# Patient Record
Sex: Female | Born: 1955 | Race: White | Hispanic: No | Marital: Single | Smoking: Never smoker
Health system: Southern US, Community
[De-identification: ages and names within clinical notes are randomized; demographics above are authoritative.]

## PROBLEM LIST (undated history)

## (undated) DIAGNOSIS — M75102 Unspecified rotator cuff tear or rupture of left shoulder, not specified as traumatic: Secondary | ICD-10-CM

## (undated) DIAGNOSIS — E118 Type 2 diabetes mellitus with unspecified complications: Secondary | ICD-10-CM

## (undated) DIAGNOSIS — H269 Unspecified cataract: Secondary | ICD-10-CM

## (undated) DIAGNOSIS — I10 Essential (primary) hypertension: Secondary | ICD-10-CM

## (undated) DIAGNOSIS — K219 Gastro-esophageal reflux disease without esophagitis: Secondary | ICD-10-CM

## (undated) DIAGNOSIS — N329 Bladder disorder, unspecified: Secondary | ICD-10-CM

## (undated) DIAGNOSIS — Z960 Presence of urogenital implants: Secondary | ICD-10-CM

## (undated) DIAGNOSIS — F32A Depression, unspecified: Secondary | ICD-10-CM

## (undated) DIAGNOSIS — D649 Anemia, unspecified: Secondary | ICD-10-CM

## (undated) DIAGNOSIS — M12812 Other specific arthropathies, not elsewhere classified, left shoulder: Secondary | ICD-10-CM

## (undated) DIAGNOSIS — M25552 Pain in left hip: Secondary | ICD-10-CM

## (undated) DIAGNOSIS — R112 Nausea with vomiting, unspecified: Secondary | ICD-10-CM

## (undated) DIAGNOSIS — Z978 Presence of other specified devices: Secondary | ICD-10-CM

## (undated) DIAGNOSIS — A4902 Methicillin resistant Staphylococcus aureus infection, unspecified site: Secondary | ICD-10-CM

## (undated) DIAGNOSIS — Z9641 Presence of insulin pump (external) (internal): Secondary | ICD-10-CM

## (undated) DIAGNOSIS — M25551 Pain in right hip: Secondary | ICD-10-CM

## (undated) DIAGNOSIS — S42352A Displaced comminuted fracture of shaft of humerus, left arm, initial encounter for closed fracture: Secondary | ICD-10-CM

## (undated) DIAGNOSIS — R296 Repeated falls: Secondary | ICD-10-CM

## (undated) DIAGNOSIS — F329 Major depressive disorder, single episode, unspecified: Secondary | ICD-10-CM

## (undated) HISTORY — DX: Essential (primary) hypertension: I10

## (undated) HISTORY — DX: Pain in right hip: M25.551

## (undated) HISTORY — DX: Major depressive disorder, single episode, unspecified: F32.9

## (undated) HISTORY — DX: Unspecified rotator cuff tear or rupture of left shoulder, not specified as traumatic: M75.102

## (undated) HISTORY — DX: Nausea with vomiting, unspecified: R11.2

## (undated) HISTORY — DX: Other specific arthropathies, not elsewhere classified, left shoulder: M12.812

## (undated) HISTORY — DX: Depression, unspecified: F32.A

## (undated) HISTORY — PX: EYE SURGERY: SHX253

## (undated) HISTORY — DX: Type 2 diabetes mellitus with unspecified complications: E11.8

## (undated) HISTORY — DX: Presence of insulin pump (external) (internal): Z96.41

## (undated) HISTORY — DX: Pain in left hip: M25.552

## (undated) HISTORY — DX: Displaced comminuted fracture of shaft of humerus, left arm, initial encounter for closed fracture: S42.352A

## (undated) HISTORY — DX: Repeated falls: R29.6

## (undated) HISTORY — PX: LAPAROSCOPIC OOPHORECTOMY: SUR783

## (undated) HISTORY — DX: Methicillin resistant Staphylococcus aureus infection, unspecified site: A49.02

## (undated) HISTORY — DX: Unspecified cataract: H26.9

---

## 2009-04-26 ENCOUNTER — Emergency Department: Payer: Self-pay | Admitting: Emergency Medicine

## 2009-04-28 ENCOUNTER — Emergency Department: Payer: Self-pay | Admitting: Emergency Medicine

## 2009-08-03 ENCOUNTER — Ambulatory Visit: Payer: Self-pay | Admitting: Internal Medicine

## 2009-08-24 ENCOUNTER — Ambulatory Visit: Payer: Self-pay | Admitting: Internal Medicine

## 2011-10-25 ENCOUNTER — Encounter: Payer: Self-pay | Admitting: Internal Medicine

## 2011-10-25 ENCOUNTER — Ambulatory Visit (INDEPENDENT_AMBULATORY_CARE_PROVIDER_SITE_OTHER): Payer: BC Managed Care – PPO | Admitting: Internal Medicine

## 2011-10-25 VITALS — BP 150/90 | HR 102 | Temp 98.6°F | Wt 163.5 lb

## 2011-10-25 DIAGNOSIS — F3289 Other specified depressive episodes: Secondary | ICD-10-CM

## 2011-10-25 DIAGNOSIS — E119 Type 2 diabetes mellitus without complications: Secondary | ICD-10-CM

## 2011-10-25 DIAGNOSIS — I10 Essential (primary) hypertension: Secondary | ICD-10-CM

## 2011-10-25 DIAGNOSIS — F329 Major depressive disorder, single episode, unspecified: Secondary | ICD-10-CM

## 2011-10-25 DIAGNOSIS — Z0000 Encounter for general adult medical examination without abnormal findings: Secondary | ICD-10-CM

## 2011-10-25 MED ORDER — LISINOPRIL 10 MG PO TABS: 10.0000 mg | ORAL_TABLET | Freq: Every day | ORAL | Status: DC

## 2011-10-25 MED ORDER — METFORMIN HCL 500 MG PO TABS: 500.0000 mg | ORAL_TABLET | Freq: Two times a day (BID) | ORAL | Status: DC

## 2011-10-25 MED ORDER — CITALOPRAM HYDROBROMIDE 20 MG PO TABS: 20.0000 mg | ORAL_TABLET | Freq: Every day | ORAL | Status: DC

## 2011-10-26 ENCOUNTER — Encounter: Payer: Self-pay | Admitting: Internal Medicine

## 2011-10-26 DIAGNOSIS — F329 Major depressive disorder, single episode, unspecified: Secondary | ICD-10-CM | POA: Insufficient documentation

## 2011-10-26 DIAGNOSIS — I10 Essential (primary) hypertension: Secondary | ICD-10-CM | POA: Insufficient documentation

## 2011-10-26 DIAGNOSIS — E1165 Type 2 diabetes mellitus with hyperglycemia: Secondary | ICD-10-CM | POA: Insufficient documentation

## 2011-10-26 LAB — COMPREHENSIVE METABOLIC PANEL
ALT: 67 U/L — ABNORMAL HIGH (ref 0–35)
CO2: 23 mEq/L (ref 19–32)
Calcium: 9.5 mg/dL (ref 8.4–10.5)
Chloride: 98 mEq/L (ref 96–112)
Creatinine, Ser: 0.4 mg/dL (ref 0.4–1.2)
GFR: 156.56 mL/min (ref >60.00)
Glucose, Bld: 350 mg/dL — ABNORMAL HIGH (ref 70–99)

## 2011-10-26 LAB — CBC WITH DIFFERENTIAL/PLATELET
Eosinophils Relative: 2.2 % (ref 0.0–5.0)
HCT: 44.1 % (ref 36.0–46.0)
Hemoglobin: 15.3 g/dL — ABNORMAL HIGH (ref 12.0–15.0)
Lymphs Abs: 2.7 10*3/uL (ref 0.7–4.0)
Monocytes Relative: 4.5 % (ref 3.0–12.0)
Neutro Abs: 5.8 10*3/uL (ref 1.4–7.7)
RDW: 13.2 % (ref 11.5–14.6)
WBC: 9.1 10*3/uL (ref 4.5–10.5)

## 2011-10-26 LAB — TSH: TSH: 1.81 u[IU]/mL (ref 0.35–5.50)

## 2011-10-26 LAB — LIPID PANEL: Triglycerides: 2227 mg/dL — ABNORMAL HIGH (ref 0.0–149.0)

## 2011-10-26 NOTE — Assessment & Plan Note (Signed)
Patient has been lost to followup for over a year. She has not been checking her blood sugars. Will check hemoglobin A1c and renal function with labs today. We'll have her followup in one month. We'll plan to continue metformin.

## 2011-10-26 NOTE — Assessment & Plan Note (Signed)
Blood pressure is elevated today. However, patient is tearful in clinic. We'll plan to check renal function with labs. We'll plan to continue lisinopril. Patient will followup in one month.
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
## 2011-10-26 NOTE — Assessment & Plan Note (Signed)
Patient with ongoing depression which is recently exacerbated by death of her mother and interaction with her father. Offered support today. Will start Celexa. Discussed counseling, but she would like to hold off on this for now. We'll have her followup in one month.

## 2011-10-30 ENCOUNTER — Encounter: Payer: Self-pay | Admitting: Internal Medicine

## 2011-10-30 ENCOUNTER — Ambulatory Visit (INDEPENDENT_AMBULATORY_CARE_PROVIDER_SITE_OTHER): Payer: BC Managed Care – PPO | Admitting: Internal Medicine

## 2011-10-30 DIAGNOSIS — I10 Essential (primary) hypertension: Secondary | ICD-10-CM

## 2011-10-30 DIAGNOSIS — E781 Pure hyperglyceridemia: Secondary | ICD-10-CM

## 2011-10-30 DIAGNOSIS — F3289 Other specified depressive episodes: Secondary | ICD-10-CM

## 2011-10-30 DIAGNOSIS — F329 Major depressive disorder, single episode, unspecified: Secondary | ICD-10-CM

## 2011-10-30 DIAGNOSIS — E119 Type 2 diabetes mellitus without complications: Secondary | ICD-10-CM

## 2011-10-30 MED ORDER — METFORMIN HCL 500 MG PO TABS: 500.0000 mg | ORAL_TABLET | Freq: Two times a day (BID) | ORAL | Status: DC

## 2011-10-30 MED ORDER — LISINOPRIL 10 MG PO TABS: 10.0000 mg | ORAL_TABLET | Freq: Every day | ORAL | Status: DC

## 2011-10-30 MED ORDER — FENOFIBRATE 48 MG PO TABS: 48.0000 mg | ORAL_TABLET | Freq: Every day | ORAL | Status: DC

## 2011-10-30 MED ORDER — CITALOPRAM HYDROBROMIDE 20 MG PO TABS: 20.0000 mg | ORAL_TABLET | Freq: Every day | ORAL | Status: DC

## 2011-10-30 MED ORDER — INSULIN DETEMIR 100 UNIT/ML ~~LOC~~ SOLN: 10.0000 [IU] | Freq: Every day | SUBCUTANEOUS | Status: DC

## 2011-10-30 MED ORDER — INSULIN PEN NEEDLE 32G X 6 MM MISC: 1.0000 [IU] | Status: DC | PRN

## 2011-10-30 NOTE — Assessment & Plan Note (Signed)
Blood sugar very poorly controlled with hemoglobin A1c of 12.6%. Discussed use of insulin. Patient will start Levemir 10 units nightly. She will monitor blood sugar twice daily. She will call if any sugars less than 80 or greater than 250. We also discussed nutrition and importance of limiting carbohydrate intake, particularly processed carbohydrates. Encouraged her in her efforts at this. Also encouraged regular physical exercise. She is planning to start walking 15 minutes daily at work. We will plan to have her followup in 2 weeks.

## 2011-10-30 NOTE — Assessment & Plan Note (Signed)
Blood pressure is still slightly above goal. We'll plan to continue lisinopril for now. Encouraged her to increase her efforts at improvement glucose control and regular physical exercise including walking daily. She will followup in one month.

## 2011-10-30 NOTE — Patient Instructions (Addendum)
Inject 10units of Levemir at bedtime. Record blood sugar 1-2 times per day.  Goal fasting blood sugar 80-120.  Goal sugar 2 hours after meal less than 200. Any blood sugar less than 80, please call.  Follow up 2 weeks.

## 2011-10-30 NOTE — Assessment & Plan Note (Signed)
Patient with severe hypertriglyceridemia with triglycerides greater than 2000. We discussed the risk of pancreatitis. We discussed the importance of limiting intake of processed carbohydrates. We discussed the importance of maintaining control over her blood sugar. We'll plan to start fenofibrate. We'll plan to recheck lipids in 2-4 weeks. When lipids are below 1000, will plan to start statin medication. Patient will call if any questions or concerns. She will followup in one month.

## 2011-10-30 NOTE — Progress Notes (Signed)
  Subjective:    Patient ID: Julia Shea, female    DOB: June 18, 1956, 56 y.o.   MRN: [id number]  HPI 56 year old female with diabetes, hypertension, hyperlipidemia, and depression presents for followup. She was recently seen in clinic and had lab work performed which had several abnormalities. First, she was noted to have elevated triglycerides greater than 2000. We discussed the risk of pancreatitis with triglycerides this high. Her total cholesterol was also elevated above 300. We also discussed the risk of heart disease with elevated total cholesterol. We discussed the importance of improving diet and limiting intake of processed carbohydrates. We also discussed the importance of starting a medicine to lower her triglycerides and then medicine such as statin drug to help reduce her risk of heart attack. She has started to make some efforts to improve her diet by limiting intake of saturated fat and carbohydrates.  We also discussed her hemoglobin A1c which was markedly elevated at 12.6%. She has not been regularly checking her blood sugars. She is planning to get the help of her) and checking her blood sugars on a regular basis.  Outpatient Encounter Prescriptions as of 10/30/2011  Medication Sig Dispense Refill  . citalopram (CELEXA) 20 MG tablet Take 1 tablet (20 mg total) by mouth daily.  90 tablet  1  . lisinopril (PRINIVIL,ZESTRIL) 10 MG tablet Take 1 tablet (10 mg total) by mouth daily.  90 tablet  2  . metFORMIN (GLUCOPHAGE) 500 MG tablet Take 1 tablet (500 mg total) by mouth 2 (two) times daily with a meal.  180 tablet  1  . DISCONTD: citalopram (CELEXA) 20 MG tablet Take 1 tablet (20 mg total) by mouth daily.  30 tablet  3  . DISCONTD: lisinopril (PRINIVIL,ZESTRIL) 10 MG tablet Take 1 tablet (10 mg total) by mouth daily.  30 tablet  6  . DISCONTD: metFORMIN (GLUCOPHAGE) 500 MG tablet Take 1 tablet (500 mg total) by mouth 2 (two) times daily with a meal.  60 tablet  6  . fenofibrate  (TRICOR) 48 MG tablet Take 1 tablet (48 mg total) by mouth daily.  30 tablet  3  . insulin detemir (LEVEMIR FLEXPEN) 100 UNIT/ML injection Inject 10 Units into the skin at bedtime.  10 mL  12  . Insulin Pen Needle (NOVOFINE) 32G X 6 MM MISC 1 Units by Does not apply route as needed.  100 each  3    Review of Systems  Constitutional: Negative for fever and chills.  Respiratory: Negative for cough and shortness of breath.   Cardiovascular: Negative for chest pain and leg swelling.  Gastrointestinal: Negative for abdominal pain.  Musculoskeletal: Negative for myalgias.  Psychiatric/Behavioral: Positive for dysphoric mood.   BP 144/86  Pulse 100  Temp(Src) 98.3 F (36.8 C) (Oral)  Ht 5\' 2"  (1.575 m)  Wt 163 lb (73.936 kg)  BMI 29.81 kg/m2  SpO2 95%     Objective:   Physical Exam  Constitutional: She appears well-developed and well-nourished.  HENT:  Head: Normocephalic and atraumatic.  Eyes: Pupils are equal, round, and reactive to light.  Neck: Normal range of motion.  Pulmonary/Chest: Effort normal.  Skin: She is not diaphoretic.  Psychiatric: She has a normal mood and affect. Her behavior is normal. Judgment and thought content normal.          Assessment & Plan:  >50% of visit spent in counseling related to management of hypertriglyceridemia and diabetes

## 2011-11-05 ENCOUNTER — Encounter: Payer: Self-pay | Admitting: *Deleted

## 2011-11-14 ENCOUNTER — Telehealth: Payer: Self-pay | Admitting: *Deleted

## 2011-11-14 ENCOUNTER — Ambulatory Visit (INDEPENDENT_AMBULATORY_CARE_PROVIDER_SITE_OTHER): Payer: BC Managed Care – PPO | Admitting: Internal Medicine

## 2011-11-14 ENCOUNTER — Other Ambulatory Visit (HOSPITAL_COMMUNITY)
Admission: RE | Admit: 2011-11-14 | Discharge: 2011-11-14 | Disposition: A | Discharge status: Home / Self Care | Payer: BC Managed Care – PPO | Source: Ambulatory Visit | Attending: Internal Medicine | Admitting: Internal Medicine

## 2011-11-14 ENCOUNTER — Encounter: Payer: Self-pay | Admitting: Internal Medicine

## 2011-11-14 DIAGNOSIS — IMO0002 Reserved for concepts with insufficient information to code with codable children: Secondary | ICD-10-CM

## 2011-11-14 DIAGNOSIS — Z1239 Encounter for other screening for malignant neoplasm of breast: Secondary | ICD-10-CM

## 2011-11-14 DIAGNOSIS — Z01419 Encounter for gynecological examination (general) (routine) without abnormal findings: Secondary | ICD-10-CM | POA: Insufficient documentation

## 2011-11-14 DIAGNOSIS — E1165 Type 2 diabetes mellitus with hyperglycemia: Secondary | ICD-10-CM

## 2011-11-14 DIAGNOSIS — E119 Type 2 diabetes mellitus without complications: Secondary | ICD-10-CM

## 2011-11-14 DIAGNOSIS — Z0000 Encounter for general adult medical examination without abnormal findings: Secondary | ICD-10-CM | POA: Insufficient documentation

## 2011-11-14 DIAGNOSIS — Z1159 Encounter for screening for other viral diseases: Secondary | ICD-10-CM | POA: Insufficient documentation

## 2011-11-14 MED ORDER — INSULIN DETEMIR 100 UNIT/ML ~~LOC~~ SOLN: 20.0000 [IU] | Freq: Every day | SUBCUTANEOUS | Status: DC

## 2011-11-14 NOTE — Progress Notes (Signed)
Subjective:    Patient ID: Julia Shea, female    DOB: Jan 29, 1956, 56 y.o.   MRN: [id number]  HPI 56 year old female with history of diabetes, depression, hypertension presents for her annual exam. In regards to her diabetes, she notes that her blood sugars have been slightly improved. She brings records today showing blood sugars typically between 200 and 300. She has been taking Levemir 10 units daily. She denies any complications with this medication. She denies any low blood sugars less than 80. She has been limiting her intake of processed carbohydrates. She has also increased her exercise by walking.  In regards to general healthcare, she is due for mammogram which has been ordered. She reports that she is also due for Pap smear. She is up-to-date on vaccinations and lab work. She denies any new complaints today. She is in the process of making significant changes in an effort to improve her diet and exercise. She also notes that she is considering a change in her job in an effort to reduce stress.  Outpatient Encounter Prescriptions as of 11/14/2011  Medication Sig Dispense Refill  . citalopram (CELEXA) 20 MG tablet Take 1 tablet (20 mg total) by mouth daily.  90 tablet  1  . fenofibrate (TRICOR) 48 MG tablet Take 1 tablet (48 mg total) by mouth daily.  30 tablet  3  . insulin detemir (LEVEMIR FLEXPEN) 100 UNIT/ML injection Inject 20 Units into the skin at bedtime.  10 mL  12  . Insulin Pen Needle (NOVOFINE) 32G X 6 MM MISC 1 Units by Does not apply route as needed.  100 each  3  . lisinopril (PRINIVIL,ZESTRIL) 10 MG tablet Take 1 tablet (10 mg total) by mouth daily.  90 tablet  2  . metFORMIN (GLUCOPHAGE) 500 MG tablet Take 1 tablet (500 mg total) by mouth 2 (two) times daily with a meal.  180 tablet  1  . DISCONTD: insulin detemir (LEVEMIR FLEXPEN) 100 UNIT/ML injection Inject 10 Units into the skin at bedtime.  10 mL  12    Review of Systems  Constitutional: Negative for fever,  chills, appetite change, fatigue and unexpected weight change.  HENT: Negative for ear pain, congestion, sore throat, trouble swallowing, neck pain, voice change and sinus pressure.   Eyes: Negative for visual disturbance.  Respiratory: Negative for cough, shortness of breath, wheezing and stridor.   Cardiovascular: Negative for chest pain, palpitations and leg swelling.  Gastrointestinal: Negative for nausea, vomiting, abdominal pain, diarrhea, constipation, blood in stool, abdominal distention and anal bleeding.  Genitourinary: Negative for dysuria and flank pain.  Musculoskeletal: Negative for myalgias, arthralgias and gait problem.  Skin: Negative for color change and rash.  Neurological: Negative for dizziness and headaches.  Hematological: Negative for adenopathy. Does not bruise/bleed easily.  Psychiatric/Behavioral: Negative for suicidal ideas, sleep disturbance and dysphoric mood. The patient is not nervous/anxious.    BP 138/82  Pulse 88  Temp(Src) 98.1 F (36.7 C) (Oral)  Ht 5' 1.5" (1.562 m)  Wt 164 lb (74.39 kg)  BMI 30.49 kg/m2  SpO2 98%     Objective:   Physical Exam  Constitutional: She is oriented to person, place, and time. She appears well-developed and well-nourished. No distress.  HENT:  Head: Normocephalic and atraumatic.  Right Ear: External ear normal.  Left Ear: External ear normal.  Nose: Nose normal.  Mouth/Throat: Oropharynx is clear and moist. No oropharyngeal exudate.  Eyes: Conjunctivae are normal. Pupils are equal, round, and reactive to light. Right eye  exhibits no discharge. Left eye exhibits no discharge. No scleral icterus.  Neck: Normal range of motion. Neck supple. No tracheal deviation present. No thyromegaly present.  Cardiovascular: Normal rate, regular rhythm, normal heart sounds and intact distal pulses.  Exam reveals no gallop and no friction rub.   No murmur heard. Pulmonary/Chest: Effort normal and breath sounds normal. No respiratory  distress. She has no wheezes. She has no rales. She exhibits no tenderness.  Abdominal: Soft. Bowel sounds are normal. She exhibits no distension and no mass. There is no tenderness. There is no rebound and no guarding.  Genitourinary: Uterus normal. No breast swelling, tenderness, discharge or bleeding. Pelvic exam was performed with patient prone. There is no rash, tenderness or lesion on the right labia. There is no rash, tenderness or lesion on the left labia. Uterus is not enlarged and not tender. Cervix exhibits no motion tenderness, no discharge and no friability. Right adnexum displays no mass, no tenderness and no fullness. Left adnexum displays no mass, no tenderness and no fullness. There is erythema and tenderness (difficult to examine with even small speculum because of tenderness) around the vagina. No vaginal discharge found.  Musculoskeletal: Normal range of motion. She exhibits no edema and no tenderness.  Lymphadenopathy:    She has no cervical adenopathy.  Neurological: She is alert and oriented to person, place, and time. No cranial nerve deficit. She exhibits normal muscle tone. Coordination normal.  Skin: Skin is warm and dry. No rash noted. She is not diaphoretic. No erythema. No pallor.  Psychiatric: She has a normal mood and affect. Her behavior is normal. Judgment and thought content normal.          Assessment & Plan:

## 2011-11-14 NOTE — Progress Notes (Signed)
Addended by: Jobie Quaker on: 11/14/2011 02:28 PM   Modules accepted: Orders

## 2011-11-14 NOTE — Assessment & Plan Note (Signed)
Exam normal today including breast exam. Pap smear is pending. Will set up mammogram. Patient is up-to-date on lab work. She is also up-to-date on vaccinations.

## 2011-11-14 NOTE — Patient Instructions (Signed)
You are doing well.  Increase Levemir to 20units daily.  Email or call with blood sugar readings next week.  Follow up 1 month.

## 2011-11-14 NOTE — Telephone Encounter (Signed)
FML[employer] form found, left VM for pt that they were ready for pick up. Copy sent to be scanned

## 2011-11-14 NOTE — Assessment & Plan Note (Signed)
Blood sugars are gradually improving. However, still above goal. Will increase dose of Levemir to 20 units daily. Patient will continue to record blood sugars and will e-mail or call with blood sugar report in one week. Followup in one month.

## 2011-11-14 NOTE — Assessment & Plan Note (Signed)
Breast exam normal today. Will set up mammogram. 

## 2011-11-21 ENCOUNTER — Telehealth: Payer: Self-pay | Admitting: *Deleted

## 2011-11-21 NOTE — Telephone Encounter (Signed)
Message copied by Vernie Murders on Wed Nov 21, 2011  6:10 PM ------      Message from: Ronna Polio A      Created: Tue Nov 20, 2011  1:34 PM       It will not allow me to release PAP to Mychart

## 2011-12-07 ENCOUNTER — Encounter: Payer: Self-pay | Admitting: Internal Medicine

## 2011-12-12 ENCOUNTER — Ambulatory Visit: Payer: BC Managed Care – PPO | Admitting: Internal Medicine

## 2012-03-26 ENCOUNTER — Other Ambulatory Visit: Payer: Self-pay | Admitting: Internal Medicine

## 2012-03-27 NOTE — Telephone Encounter (Signed)
Pt needs to make follow up

## 2012-03-28 ENCOUNTER — Other Ambulatory Visit: Payer: Self-pay | Admitting: Internal Medicine

## 2012-07-06 ENCOUNTER — Other Ambulatory Visit: Payer: Self-pay | Admitting: Internal Medicine

## 2012-07-09 ENCOUNTER — Other Ambulatory Visit: Payer: Self-pay | Admitting: Internal Medicine

## 2012-07-10 ENCOUNTER — Other Ambulatory Visit: Payer: Self-pay | Admitting: *Deleted

## 2012-07-10 DIAGNOSIS — F329 Major depressive disorder, single episode, unspecified: Secondary | ICD-10-CM

## 2012-07-10 MED ORDER — CITALOPRAM HYDROBROMIDE 20 MG PO TABS: 20.0000 mg | ORAL_TABLET | Freq: Every day | ORAL | Status: DC

## 2012-10-19 ENCOUNTER — Other Ambulatory Visit: Payer: Self-pay | Admitting: Internal Medicine

## 2012-10-24 ENCOUNTER — Other Ambulatory Visit: Payer: Self-pay | Admitting: Internal Medicine

## 2012-10-24 MED ORDER — FENOFIBRATE 48 MG PO TABS: 48.0000 mg | ORAL_TABLET | Freq: Every day | ORAL | Status: DC

## 2012-10-24 MED ORDER — METFORMIN HCL 500 MG PO TABS: 500.0000 mg | ORAL_TABLET | Freq: Two times a day (BID) | ORAL | Status: DC

## 2012-10-24 NOTE — Telephone Encounter (Signed)
Left message on voicemail. Rx refills for one month supply only was sent to pharmacy. Pt needs to make an appointment.

## 2012-10-24 NOTE — Telephone Encounter (Signed)
Left message on voicemail that pt needs to be seen due to Hemoglobin A1c is out of control per Dr. Dan Humphreys. Only 30 day supply sent to pharmacy.

## 2012-10-24 NOTE — Telephone Encounter (Signed)
Pt needs to be seen. We can authorize 1 month refill on both Fenofibrate and Metformin, but will need to be seen after that.

## 2012-10-28 NOTE — Telephone Encounter (Signed)
Left message on mobile phone and work number to call back.

## 2012-10-30 NOTE — Telephone Encounter (Signed)
Left message that pt needs to call for an appt in order to receive refill

## 2012-11-13 LAB — HM PAP SMEAR: HM Pap smear: NEGATIVE

## 2012-11-15 ENCOUNTER — Other Ambulatory Visit: Payer: Self-pay

## 2013-02-18 ENCOUNTER — Telehealth: Payer: Self-pay | Admitting: *Deleted

## 2013-02-18 NOTE — Telephone Encounter (Signed)
Patient called, she woke up bleeding from the vaginal area. She has been in menopause for about 10 years now, no abdominal cramping at all. Noticed that it when she wiped and felt like it was flowing, she can fill up a pad. No other symptoms associated at all, no fever or nausea.

## 2013-02-18 NOTE — Telephone Encounter (Signed)
Left message to call back and schedule an appointment to be seen.

## 2013-02-18 NOTE — Telephone Encounter (Signed)
Needs to be seen

## 2013-04-28 ENCOUNTER — Telehealth: Payer: Self-pay | Admitting: Internal Medicine

## 2013-04-28 NOTE — Telephone Encounter (Signed)
Pt has not kept follow up in over 1 year. She has uncontrolled diabetes. Please send discharge letter.

## 2013-05-01 NOTE — Telephone Encounter (Signed)
Can you please call this pt and see if she is planning to keep follow up? If she wants to remain under our care, she will need to commit to appointments every 3 months to manage diabetes. If not we can forward her records to another provider.

## 2013-05-06 NOTE — Telephone Encounter (Signed)
Patient called back she made an acute visit for in the morning due to her thinking she has an infection.  I explained to her that being a diabetic she needed to be seen every 3 months.  She said that she didn't know about that she said that she has always came yearly.

## 2013-05-06 NOTE — Telephone Encounter (Signed)
Left msg asking patient to return my call so I can let her know what Dr Dan Humphreys has requested.

## 2013-05-07 ENCOUNTER — Encounter: Payer: Self-pay | Admitting: Internal Medicine

## 2013-05-07 ENCOUNTER — Ambulatory Visit (INDEPENDENT_AMBULATORY_CARE_PROVIDER_SITE_OTHER): Payer: BC Managed Care – PPO | Admitting: Internal Medicine

## 2013-05-07 ENCOUNTER — Telehealth: Payer: Self-pay | Admitting: *Deleted

## 2013-05-07 ENCOUNTER — Ambulatory Visit: Payer: Self-pay | Admitting: Internal Medicine

## 2013-05-07 VITALS — BP 160/90 | HR 82 | Temp 98.1°F | Wt 148.0 lb

## 2013-05-07 DIAGNOSIS — R1011 Right upper quadrant pain: Secondary | ICD-10-CM

## 2013-05-07 DIAGNOSIS — I10 Essential (primary) hypertension: Secondary | ICD-10-CM

## 2013-05-07 DIAGNOSIS — E781 Pure hyperglyceridemia: Secondary | ICD-10-CM

## 2013-05-07 DIAGNOSIS — F3289 Other specified depressive episodes: Secondary | ICD-10-CM

## 2013-05-07 DIAGNOSIS — IMO0001 Reserved for inherently not codable concepts without codable children: Secondary | ICD-10-CM

## 2013-05-07 DIAGNOSIS — E1165 Type 2 diabetes mellitus with hyperglycemia: Secondary | ICD-10-CM

## 2013-05-07 DIAGNOSIS — F329 Major depressive disorder, single episode, unspecified: Secondary | ICD-10-CM

## 2013-05-07 LAB — MICROALBUMIN / CREATININE URINE RATIO
Creatinine,U: 56 mg/dL
Microalb Creat Ratio: 0.2 mg/g (ref 0.0–30.0)
Microalb, Ur: 0.1 mg/dL (ref 0.0–1.9)

## 2013-05-07 LAB — POCT URINALYSIS DIPSTICK
Bilirubin, UA: NEGATIVE
Blood, UA: NEGATIVE
Glucose, UA: >=1000
Leukocytes, UA: NEGATIVE
Nitrite, UA: POSITIVE
Protein, UA: 30
Spec Grav, UA: 1.015
Urobilinogen, UA: 1
pH, UA: 5

## 2013-05-07 LAB — COMPREHENSIVE METABOLIC PANEL
AST: 21 U/L (ref 0–37)
Albumin: 3.9 g/dL (ref 3.5–5.2)
BUN: 12 mg/dL (ref 6–23)
Calcium: 9.4 mg/dL (ref 8.4–10.5)
Chloride: 99 mEq/L (ref 96–112)
Creatinine, Ser: 0.5 mg/dL (ref 0.4–1.2)
Glucose, Bld: 305 mg/dL — ABNORMAL HIGH (ref 70–99)
Potassium: 3.6 mEq/L (ref 3.5–5.1)

## 2013-05-07 LAB — CBC WITH DIFFERENTIAL/PLATELET
Basophils Absolute: 0 10*3/uL (ref 0.0–0.1)
Basophils Relative: 0.6 % (ref 0.0–3.0)
Eosinophils Absolute: 0.1 10*3/uL (ref 0.0–0.7)
Eosinophils Relative: 2.1 % (ref 0.0–5.0)
HCT: 41.6 % (ref 36.0–46.0)
Hemoglobin: 13.9 g/dL (ref 12.0–15.0)
Lymphocytes Relative: 35 % (ref 12.0–46.0)
Lymphs Abs: 2.3 10*3/uL (ref 0.7–4.0)
MCHC: 33.4 g/dL (ref 30.0–36.0)
MCV: 87.3 fl (ref 78.0–100.0)
Monocytes Absolute: 0.4 10*3/uL (ref 0.1–1.0)
Monocytes Relative: 5.7 % (ref 3.0–12.0)
Neutro Abs: 3.8 10*3/uL (ref 1.4–7.7)
Neutrophils Relative %: 56.6 % (ref 43.0–77.0)
Platelets: 301 10*3/uL (ref 150.0–400.0)
RBC: 4.76 Mil/uL (ref 3.87–5.11)
RDW: 13.5 % (ref 11.5–14.6)
WBC: 6.6 10*3/uL (ref 4.5–10.5)

## 2013-05-07 LAB — HM COLONOSCOPY

## 2013-05-07 LAB — HEMOGLOBIN A1C: Hgb A1c MFr Bld: 12.2 % — ABNORMAL HIGH (ref 4.6–6.5)

## 2013-05-07 LAB — LIPASE: Lipase: 16 U/L (ref 11.0–59.0)

## 2013-05-07 LAB — PROTIME-INR
INR: 1 ratio (ref 0.8–1.0)
Prothrombin Time: 10.6 s (ref 10.2–12.4)

## 2013-05-07 MED ORDER — FENOFIBRATE 48 MG PO TABS: 48.0000 mg | ORAL_TABLET | Freq: Every day | ORAL | Status: DC

## 2013-05-07 MED ORDER — METFORMIN HCL 500 MG PO TABS: 500.0000 mg | ORAL_TABLET | Freq: Two times a day (BID) | ORAL | Status: DC

## 2013-05-07 MED ORDER — CIPROFLOXACIN HCL 500 MG PO TABS: 500.0000 mg | ORAL_TABLET | Freq: Two times a day (BID) | ORAL | Status: DC

## 2013-05-07 MED ORDER — CITALOPRAM HYDROBROMIDE 40 MG PO TABS: 40.0000 mg | ORAL_TABLET | Freq: Every day | ORAL | Status: DC

## 2013-05-07 NOTE — Assessment & Plan Note (Signed)
Symptoms of depression have recently worsened. Will increase citalopram to 40 mg daily. Patient will call if symptoms are not improving. Followup in 4 weeks or sooner as needed.

## 2013-05-07 NOTE — Progress Notes (Signed)
Subjective:    Patient ID: Julia Shea, female    DOB: 12-09-1955, 57 y.o.   MRN: [id number]  HPI 57 year old female with history of diabetes, hypertension, noncompliance presents for acute visit complaining of approximately one-week history of severe right upper quadrant abdominal pain that radiates around her back. Pain is described as sharp and constant. It is improved with physical activity. It is made worse when lying flat. There is no associated nausea, vomiting, change in bowel habits. She has not had any fever or chills. She denies any urinary symptoms such as dysuria, change in urinary frequency or urinary urgency. She tried taking some leftover antibiotic she had at home with no improvement.  In regards to diabetes, she has been noncompliant with medical care. She is not consistently take her Levemir or metformin. She did not bring record of blood sugars today.  In regards to depression, she reports that symptoms have worsened with increased frequency of both depressed and anxious mood. She attributes this to stress at work. She would like to increase her dose of citalopram.  Outpatient Encounter Prescriptions as of 05/07/2013  Medication Sig Dispense Refill  . citalopram (CELEXA) 40 MG tablet Take 1 tablet (40 mg total) by mouth daily.  90 tablet  1  . fenofibrate (TRICOR) 48 MG tablet Take 1 tablet (48 mg total) by mouth daily.  30 tablet  0  . insulin detemir (LEVEMIR) 100 UNIT/ML injection Inject 10 Units into the skin at bedtime.      . Insulin Pen Needle (NOVOFINE) 32G X 6 MM MISC 1 Units by Does not apply route as needed.  100 each  3  . lisinopril (PRINIVIL,ZESTRIL) 10 MG tablet Take 1 tablet (10 mg total) by mouth daily.  90 tablet  2  . metFORMIN (GLUCOPHAGE) 500 MG tablet Take 1 tablet (500 mg total) by mouth 2 (two) times daily with a meal.  60 tablet  0   No facility-administered encounter medications on file as of 05/07/2013.   BP 160/90  Pulse 82  Temp(Src) 98.1 F  (36.7 C) (Oral)  Wt 148 lb (67.132 kg)  BMI 27.51 kg/m2  SpO2 95%  Review of Systems  Constitutional: Negative for fever, chills, appetite change, fatigue and unexpected weight change.  HENT: Negative for ear pain, congestion, sore throat, trouble swallowing, neck pain, voice change and sinus pressure.   Eyes: Negative for visual disturbance.  Respiratory: Negative for cough, shortness of breath, wheezing and stridor.   Cardiovascular: Negative for chest pain, palpitations and leg swelling.  Gastrointestinal: Positive for abdominal pain. Negative for nausea, vomiting, diarrhea, constipation, blood in stool, abdominal distention and anal bleeding.  Genitourinary: Negative for dysuria and flank pain.  Musculoskeletal: Negative for myalgias, arthralgias and gait problem.  Skin: Negative for color change and rash.  Neurological: Negative for dizziness and headaches.  Hematological: Negative for adenopathy. Does not bruise/bleed easily.  Psychiatric/Behavioral: Negative for suicidal ideas, sleep disturbance and dysphoric mood. The patient is not nervous/anxious.        Objective:   Physical Exam  Constitutional: She is oriented to person, place, and time. She appears well-developed and well-nourished. No distress.  HENT:  Head: Normocephalic and atraumatic.  Right Ear: External ear normal.  Left Ear: External ear normal.  Nose: Nose normal.  Mouth/Throat: Oropharynx is clear and moist. No oropharyngeal exudate.  Eyes: Conjunctivae are normal. Pupils are equal, round, and reactive to light. Right eye exhibits no discharge. Left eye exhibits no discharge. No scleral icterus.  Neck:  Normal range of motion. Neck supple. No tracheal deviation present. No thyromegaly present.  Cardiovascular: Normal rate, regular rhythm, normal heart sounds and intact distal pulses.  Exam reveals no gallop and no friction rub.   No murmur heard. Pulmonary/Chest: Effort normal and breath sounds normal. No  accessory muscle usage. Not tachypneic. No respiratory distress. She has no decreased breath sounds. She has no wheezes. She has no rhonchi. She has no rales. She exhibits no tenderness.  Abdominal: Soft. Normal appearance and bowel sounds are normal. She exhibits no distension. There is hepatomegaly. There is tenderness in the right upper quadrant. There is CVA tenderness (mild right sided).  Musculoskeletal: Normal range of motion. She exhibits no edema and no tenderness.  Lymphadenopathy:    She has no cervical adenopathy.  Neurological: She is alert and oriented to person, place, and time. No cranial nerve deficit. She exhibits normal muscle tone. Coordination normal.  Skin: Skin is warm and dry. No rash noted. She is not diaphoretic. No erythema. No pallor.  Psychiatric: She has a normal mood and affect. Her behavior is normal. Judgment and thought content normal.          Assessment & Plan:

## 2013-05-07 NOTE — Telephone Encounter (Signed)
OK. Please have her take the Cipro as directed, (see result note on urine) 500mg  bid x 7 days. Let's have her follow up early next week to recheck symptoms.

## 2013-05-07 NOTE — Assessment & Plan Note (Signed)
Right upper quadrant abdominal pain and tenderness on exam are most consistent with cholecystitis. Right upper quadrant ultrasound performed today showed normal gallbladder however there was diffuse nodularity noted within the liver. Will get CT of the abdomen and pelvis for further evaluation. Will check CMP, CBC, lipase with labs. Urinalysis was positive for nitrate. She may have underlying urinary tract infection which is contributing however does not have symptoms of this. Will send urine for culture and start empiric Cipro.

## 2013-05-07 NOTE — Telephone Encounter (Signed)
Ben from CT called, he stated Hepatic Steatosis, liver less than 16 cm long. No acute abnormalities noted.

## 2013-05-07 NOTE — Telephone Encounter (Signed)
Patient informed and verbally agreed. Rx sent to pharmacy on file.

## 2013-05-07 NOTE — Telephone Encounter (Signed)
We will need to get CT abdomen and pelvis with contrast for further evaluation.

## 2013-05-07 NOTE — Assessment & Plan Note (Signed)
Noncompliant with followup for diabetes. Unclear that she is taking Levemir her and metformin on a regular basis. Previous blood sugars have been markedly elevated. Will check A1c with labs today. Encouraged better compliance with medical care and followup.

## 2013-05-07 NOTE — Assessment & Plan Note (Signed)
BP Readings from Last 3 Encounters:  05/07/13 160/90  11/14/11 138/82  10/30/11 144/86  Blood pressure elevated today, likely exacerbated by abdominal pain. Will monitor BP and follow up with recheck in 4 weeks. Will check renal function with labs today. Consider increase dose of lisinopril if persistent elevation of BP >140/90.

## 2013-05-07 NOTE — Telephone Encounter (Signed)
[facility] called with report stating patient gallbladder was normal but the liver is dense and fatty, slightly enlarged.

## 2013-05-15 ENCOUNTER — Encounter: Payer: Self-pay | Admitting: Internal Medicine

## 2013-05-15 ENCOUNTER — Ambulatory Visit (INDEPENDENT_AMBULATORY_CARE_PROVIDER_SITE_OTHER): Payer: BC Managed Care – PPO | Admitting: Internal Medicine

## 2013-05-15 VITALS — BP 144/90 | HR 86 | Temp 98.1°F | Wt 146.0 lb

## 2013-05-15 DIAGNOSIS — E1165 Type 2 diabetes mellitus with hyperglycemia: Secondary | ICD-10-CM

## 2013-05-15 DIAGNOSIS — K760 Fatty (change of) liver, not elsewhere classified: Secondary | ICD-10-CM | POA: Insufficient documentation

## 2013-05-15 DIAGNOSIS — K7689 Other specified diseases of liver: Secondary | ICD-10-CM

## 2013-05-15 DIAGNOSIS — R1011 Right upper quadrant pain: Secondary | ICD-10-CM

## 2013-05-15 LAB — POCT URINALYSIS DIPSTICK
Blood, UA: NEGATIVE
Glucose, UA: >=1000
Nitrite, UA: POSITIVE
Urobilinogen, UA: 0.2

## 2013-05-15 MED ORDER — CYCLOBENZAPRINE HCL 5 MG PO TABS: 5.0000 mg | ORAL_TABLET | Freq: Three times a day (TID) | ORAL | Status: DC | PRN

## 2013-05-15 MED ORDER — INSULIN DETEMIR 100 UNIT/ML ~~LOC~~ SOLN: 30.0000 [IU] | Freq: Every day | SUBCUTANEOUS | Status: DC

## 2013-05-15 MED ORDER — PANTOPRAZOLE SODIUM 40 MG PO TBEC
40.0000 mg | DELAYED_RELEASE_TABLET | Freq: Every day | ORAL | Status: DC
Start: 2013-05-15 — End: 2018-02-06

## 2013-05-15 NOTE — Assessment & Plan Note (Signed)
Persistent symptoms of right upper quadrant abdominal pain. CT of the abdomen was normal except for hepatic steatosis. Given marked epigastric and right upper quadrant tenderness on exam, still question possible gastritis versus ulcer. Will start pantoprazole 40 mg daily. Will set up GI evaluation for possible endoscopy and for monitoring of hepatic steatosis. Alternative diagnosis would include nerve entrapment and/or muscular strain. Will try adding Flexeril to see if any improvement in symptoms. Followup 4 weeks or sooner as needed.

## 2013-05-15 NOTE — Assessment & Plan Note (Signed)
Reviewed recent A1c which was greater than 12%. Again strongly encouraged better compliance with insulin. Will increase Levemir to 30 units daily. Followup in 4 weeks.

## 2013-05-15 NOTE — Progress Notes (Signed)
Subjective:    Patient ID: Julia Shea, female    DOB: 1956/08/07, 57 y.o.   MRN: [id number]  HPI 57 year old female with history of diabetes, hypertension, hepatic steatosis presents for followup after recent episode of right upper quadrant abdominal pain. She was seen in clinic last week complaining of right upper quadrant abdominal pain. There was concern for cholecystitis. She was sent for CT of the abdomen which was normal except for steatohepatitis. She reports that right-sided abdominal pain has been persistent over the last week. It seems to be worsened with movement. It is improved somewhat with warm compresses or running hot water over the area. She questions whether she may have a pulled muscle. She notes that she has had right upper quadrant abdominal pain off-and-on for years. She denies any nausea or vomiting. She denies fever or chills. She denies any dysuria, hematuria, change in urinary frequency or urgency. She was noted at her last visit to have a urinary tract infection and was treated with Cipro. She completed a course of this medication.  Outpatient Encounter Prescriptions as of 05/15/2013  Medication Sig Dispense Refill  . citalopram (CELEXA) 40 MG tablet Take 1 tablet (40 mg total) by mouth daily.  90 tablet  1  . fenofibrate (TRICOR) 48 MG tablet Take 1 tablet (48 mg total) by mouth daily.  30 tablet  0  . insulin detemir (LEVEMIR) 100 UNIT/ML injection Inject 0.3 mL (30 Units total) into the skin at bedtime.  10 mL  6  . Insulin Pen Needle (NOVOFINE) 32G X 6 MM MISC 1 Units by Does not apply route as needed.  100 each  3  . lisinopril (PRINIVIL,ZESTRIL) 10 MG tablet Take 1 tablet (10 mg total) by mouth daily.  90 tablet  2  . metFORMIN (GLUCOPHAGE) 500 MG tablet Take 1 tablet (500 mg total) by mouth 2 (two) times daily with a meal.  60 tablet  0  . [DISCONTINUED] insulin detemir (LEVEMIR) 100 UNIT/ML injection Inject 10 Units into the skin at bedtime.      . cyclobenzaprine  (FLEXERIL) 5 MG tablet Take 1-2 tablets (5-10 mg total) by mouth 3 (three) times daily as needed for muscle spasms.  60 tablet  2  . pantoprazole (PROTONIX) 40 MG tablet Take 1 tablet (40 mg total) by mouth daily.  30 tablet  6  . [DISCONTINUED] ciprofloxacin (CIPRO) 500 MG tablet Take 1 tablet (500 mg total) by mouth 2 (two) times daily.  14 tablet  0   No facility-administered encounter medications on file as of 05/15/2013.   BP 144/90  Pulse 86  Temp(Src) 98.1 F (36.7 C) (Oral)  Wt 146 lb (66.225 kg)  BMI 27.14 kg/m2  SpO2 97%  Review of Systems  Constitutional: Negative for fever, chills, appetite change, fatigue and unexpected weight change.  HENT: Negative for ear pain, congestion, sore throat, trouble swallowing, neck pain, voice change and sinus pressure.   Eyes: Negative for visual disturbance.  Respiratory: Negative for cough, shortness of breath, wheezing and stridor.   Cardiovascular: Negative for chest pain, palpitations and leg swelling.  Gastrointestinal: Positive for abdominal pain. Negative for nausea, vomiting, diarrhea, constipation, blood in stool, abdominal distention and anal bleeding.  Genitourinary: Negative for dysuria and flank pain.  Musculoskeletal: Negative for myalgias, arthralgias and gait problem.  Skin: Negative for color change and rash.  Neurological: Negative for dizziness and headaches.  Hematological: Negative for adenopathy. Does not bruise/bleed easily.  Psychiatric/Behavioral: Negative for suicidal ideas, sleep disturbance and  dysphoric mood. The patient is not nervous/anxious.        Objective:   Physical Exam  Constitutional: She is oriented to person, place, and time. She appears well-developed and well-nourished. No distress.  HENT:  Head: Normocephalic and atraumatic.  Right Ear: External ear normal.  Left Ear: External ear normal.  Nose: Nose normal.  Mouth/Throat: Oropharynx is clear and moist. No oropharyngeal exudate.  Eyes:  Conjunctivae are normal. Pupils are equal, round, and reactive to light. Right eye exhibits no discharge. Left eye exhibits no discharge. No scleral icterus.  Neck: Normal range of motion. Neck supple. No tracheal deviation present. No thyromegaly present.  Cardiovascular: Normal rate, regular rhythm, normal heart sounds and intact distal pulses.  Exam reveals no gallop and no friction rub.   No murmur heard. Pulmonary/Chest: Effort normal and breath sounds normal. No accessory muscle usage. Not tachypneic. No respiratory distress. She has no decreased breath sounds. She has no wheezes. She has no rhonchi. She has no rales. She exhibits no tenderness.  Abdominal: Soft. Bowel sounds are normal. She exhibits no distension and no mass. There is tenderness (RUQ). There is no rebound and no guarding.  Musculoskeletal: Normal range of motion. She exhibits no edema and no tenderness.  Lymphadenopathy:    She has no cervical adenopathy.  Neurological: She is alert and oriented to person, place, and time. No cranial nerve deficit. She exhibits normal muscle tone. Coordination normal.  Skin: Skin is warm and dry. No rash noted. She is not diaphoretic. No erythema. No pallor.  Psychiatric: She has a normal mood and affect. Her behavior is normal. Judgment and thought content normal.          Assessment & Plan:

## 2013-05-15 NOTE — Assessment & Plan Note (Signed)
Discussed diagnosis of hepatic steatosis and importance of good blood sugar control. Will set up GI evaluation.

## 2013-05-18 ENCOUNTER — Telehealth: Payer: Self-pay | Admitting: *Deleted

## 2013-05-18 ENCOUNTER — Encounter: Payer: Self-pay | Admitting: Internal Medicine

## 2013-05-18 MED ORDER — CIPROFLOXACIN HCL 500 MG PO TABS: 500.0000 mg | ORAL_TABLET | Freq: Two times a day (BID) | ORAL | Status: DC

## 2013-05-18 NOTE — Telephone Encounter (Signed)
Message copied by Theola Sequin on Mon May 18, 2013  3:04 PM ------      Message from: Ronna Polio A      Created: Fri May 15, 2013 10:57 AM       Can you please send urine for culture? Can you ask pt to continue Cipro 500mg  po bid for another week (we will need to call in refill), until we get urine culture back. Urine is suggestive of persistent infection. ------

## 2013-05-18 NOTE — Telephone Encounter (Signed)
New Rx sent to [facility] pharmacy

## 2013-05-26 ENCOUNTER — Encounter: Payer: Self-pay | Admitting: Emergency Medicine

## 2013-06-08 ENCOUNTER — Encounter: Payer: BC Managed Care – PPO | Admitting: Internal Medicine

## 2013-06-14 ENCOUNTER — Other Ambulatory Visit: Payer: Self-pay | Admitting: Internal Medicine

## 2013-06-15 ENCOUNTER — Other Ambulatory Visit: Payer: Self-pay | Admitting: *Deleted

## 2013-06-15 DIAGNOSIS — I10 Essential (primary) hypertension: Secondary | ICD-10-CM

## 2013-06-15 MED ORDER — LISINOPRIL 10 MG PO TABS: 10.0000 mg | ORAL_TABLET | Freq: Every day | ORAL | Status: DC

## 2013-06-15 NOTE — Telephone Encounter (Signed)
Eprescribed.

## 2013-08-06 ENCOUNTER — Other Ambulatory Visit: Payer: Self-pay

## 2013-10-08 ENCOUNTER — Other Ambulatory Visit: Payer: Self-pay | Admitting: Internal Medicine

## 2013-10-31 ENCOUNTER — Other Ambulatory Visit: Payer: Self-pay | Admitting: Internal Medicine

## 2014-05-31 ENCOUNTER — Ambulatory Visit: Payer: Self-pay | Admitting: Internal Medicine

## 2014-06-11 ENCOUNTER — Ambulatory Visit: Payer: Self-pay | Admitting: Internal Medicine

## 2014-06-11 DIAGNOSIS — Z0289 Encounter for other administrative examinations: Secondary | ICD-10-CM

## 2014-06-15 ENCOUNTER — Encounter: Payer: Self-pay | Admitting: *Deleted

## 2014-07-22 ENCOUNTER — Telehealth: Payer: Self-pay | Admitting: Internal Medicine

## 2014-07-22 NOTE — Telephone Encounter (Signed)
Dismissal Letter sent by Certified Mail [phone]/[id number]  Received the Return Receipt showing someone picked up the Dismissal 07/26/2014

## 2016-02-25 ENCOUNTER — Emergency Department
Admission: EM | Admit: 2016-02-25 | Discharge: 2016-02-25 | Disposition: A | Discharge status: Home / Self Care | Payer: Worker's Compensation | Attending: Emergency Medicine | Admitting: Emergency Medicine

## 2016-02-25 ENCOUNTER — Emergency Department: Payer: Worker's Compensation

## 2016-02-25 ENCOUNTER — Encounter: Payer: Self-pay | Admitting: Emergency Medicine

## 2016-02-25 DIAGNOSIS — Y999 Unspecified external cause status: Secondary | ICD-10-CM | POA: Insufficient documentation

## 2016-02-25 DIAGNOSIS — E119 Type 2 diabetes mellitus without complications: Secondary | ICD-10-CM | POA: Insufficient documentation

## 2016-02-25 DIAGNOSIS — I10 Essential (primary) hypertension: Secondary | ICD-10-CM | POA: Diagnosis not present

## 2016-02-25 DIAGNOSIS — Z79899 Other long term (current) drug therapy: Secondary | ICD-10-CM | POA: Diagnosis not present

## 2016-02-25 DIAGNOSIS — S42212A Unspecified displaced fracture of surgical neck of left humerus, initial encounter for closed fracture: Secondary | ICD-10-CM | POA: Diagnosis not present

## 2016-02-25 DIAGNOSIS — Y929 Unspecified place or not applicable: Secondary | ICD-10-CM | POA: Diagnosis not present

## 2016-02-25 DIAGNOSIS — Z7984 Long term (current) use of oral hypoglycemic drugs: Secondary | ICD-10-CM | POA: Diagnosis not present

## 2016-02-25 DIAGNOSIS — J45909 Unspecified asthma, uncomplicated: Secondary | ICD-10-CM | POA: Insufficient documentation

## 2016-02-25 DIAGNOSIS — S42302A Unspecified fracture of shaft of humerus, left arm, initial encounter for closed fracture: Secondary | ICD-10-CM

## 2016-02-25 DIAGNOSIS — Y939 Activity, unspecified: Secondary | ICD-10-CM | POA: Diagnosis not present

## 2016-02-25 DIAGNOSIS — W010XXA Fall on same level from slipping, tripping and stumbling without subsequent striking against object, initial encounter: Secondary | ICD-10-CM | POA: Diagnosis not present

## 2016-02-25 DIAGNOSIS — S4992XA Unspecified injury of left shoulder and upper arm, initial encounter: Secondary | ICD-10-CM | POA: Diagnosis present

## 2016-02-25 DIAGNOSIS — F329 Major depressive disorder, single episode, unspecified: Secondary | ICD-10-CM | POA: Insufficient documentation

## 2016-02-25 MED ORDER — HYDROCODONE-ACETAMINOPHEN 5-325 MG PO TABS
1.0000 | ORAL_TABLET | ORAL | Status: DC | PRN
Start: 2016-02-25 — End: 2017-12-23

## 2016-02-25 MED ORDER — IBUPROFEN 800 MG PO TABS: 800.0000 mg | ORAL_TABLET | Freq: Three times a day (TID) | ORAL | Status: DC | PRN

## 2016-02-25 NOTE — Discharge Instructions (Signed)
Humerus Fracture Treated With Immobilization °The humerus is the large bone in your upper arm. You have a broken (fractured) humerus. These fractures are easily diagnosed with X-rays. °TREATMENT  °Simple fractures which will heal without disability are treated with simple immobilization. Immobilization means you will wear a cast, splint, or sling. You have a fracture which will do well with immobilization. The fracture will heal well simply by being held in a good position until it is stable enough to begin range of motion exercises. Do not take part in activities which would further injure your arm.  °HOME CARE INSTRUCTIONS  °· Put ice on the injured area. °¨ Put ice in a plastic bag. °¨ Place a towel between your skin and the bag. °¨ Leave the ice on for 15-20 minutes, 03-04 times a day. °· If you have a cast: °¨ Do not scratch the skin under the cast using sharp or pointed objects. °¨ Check the skin around the cast every day. You may put lotion on any red or sore areas. °¨ Keep your cast dry and clean. °· If you have a splint: °¨ Wear the splint as directed. °¨ Keep your splint dry and clean. °¨ You may loosen the elastic around the splint if your fingers become numb, tingle, or turn cold or blue. °· If you have a sling: °¨ Wear the sling as directed. °· Do not put pressure on any part of your cast or splint until it is fully hardened. °· Your cast or splint can be protected during bathing with a plastic bag. Do not lower the cast or splint into water. °· Only take over-the-counter or prescription medicines for pain, discomfort, or fever as directed by your caregiver. °· Do range of motion exercises as instructed by your caregiver. °· Follow up as directed by your caregiver. This is very important in order to avoid permanent injury or disability and chronic pain. °SEEK IMMEDIATE MEDICAL CARE IF:  °· Your skin or nails in the injured arm turn blue or gray. °· Your arm feels cold or numb. °· You develop severe pain  in the injured arm. °· You are having problems with the medicines you were given. °MAKE SURE YOU:  °· Understand these instructions. °· Will watch your condition. °· Will get help right away if you are not doing well or get worse. °  °This information is not intended to replace advice given to you by your health care provider. Make sure you discuss any questions you have with your health care provider. °  °Document Released: 12/24/2000 Document Revised: 10/08/2014 Document Reviewed: 02/09/2015 °Elsevier Interactive Patient Education ©2016 [employer] ° °

## 2016-02-25 NOTE — ED Notes (Signed)
Pt to ed with c/o fall last night while at work.  Pt states she tripped on a rug and fell forward landing on left arm/shoulder.

## 2016-02-25 NOTE — ED Provider Notes (Signed)
[facility] Emergency Department Provider Note  ____________________________________________  Time seen: Approximately 10:21 AM  I have reviewed the triage vital signs and the nursing notes.   HISTORY  Chief Complaint Arm Pain    HPI Julia Shea is a 60 y.o. female presents for evaluation of left upper arm pain. 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 Complains of continued pain today. Pain is described as 10 over 10 and no relief with over-the-counter medications.   Past Medical History  Diagnosis Date  . Diabetes mellitus   . Hypertension   . Depression   . MRSA infection   . Asthma     Patient Active Problem List   Diagnosis Date Noted  . Hepatic steatosis 05/15/2013  . Abdominal pain, right upper quadrant 05/07/2013  . Breast cancer screening 11/14/2011  . Hypertriglyceridemia 10/30/2011  . Depression 10/26/2011  . Diabetes mellitus type 2, uncontrolled ([place]) 10/26/2011  . Hypertension 10/26/2011    Past Surgical History  Procedure Laterality Date  . Laparoscopic oophorectomy      removal of cyst    Current Outpatient Rx  Name  Route  Sig  Dispense  Refill  . HYDROcodone-acetaminophen (NORCO) 5-325 MG tablet   Oral   Take 1-2 tablets by mouth every 4 (four) hours as needed for moderate pain.   15 tablet   0   . ibuprofen (ADVIL,MOTRIN) 800 MG tablet   Oral   Take 1 tablet (800 mg total) by mouth every 8 (eight) hours as needed.   30 tablet   0   . Insulin Pen Needle (NOVOFINE) 32G X 6 MM MISC   Does not apply   1 Units by Does not apply route as needed.   100 each   3   . lisinopril (PRINIVIL,ZESTRIL) 10 MG tablet      TAKE ONE TABLET BY MOUTH ONCE DAILY * CONTACT MD FOR YEARLY EXAM*   90 tablet   0     NO MORE REFILLS UNTIL SEEN   . metFORMIN (GLUCOPHAGE) 500 MG tablet      TAKE ONE TABLET BY MOUTH TWICE DAILY WITH A MEAL   60 tablet   0    NEEDS OFFICE VISIT FOR FURTHER REFILLS   . pantoprazole (PROTONIX) 40 MG tablet   Oral   Take 1 tablet (40 mg total) by mouth daily.   30 tablet   6     Allergies Penicillins and Sulfa antibiotics  Family History  Problem Relation Age of Onset  . Diabetes Maternal Uncle     Social History Social History  Substance Use Topics  . Smoking status: Never Smoker   . Smokeless tobacco: None  . Alcohol Use: No    Review of Systems Constitutional: No fever/chills Eyes: No visual changes. ENT: No sore throat. Cardiovascular: Denies chest pain. Respiratory: Denies shortness of breath. Musculoskeletal: Positive for left upper arm pain. Skin: Negative for rash. Neurological: Negative for headaches, focal weakness or numbness.  10-point ROS otherwise negative.  ____________________________________________   PHYSICAL EXAM:  VITAL SIGNS: ED Triage Vitals  Enc Vitals Group     BP 02/25/16 0958 126/84 mmHg     Pulse Rate 02/25/16 0958 93     Resp 02/25/16 0958 18     Temp 02/25/16 0958 97 F (36.1 C)     Temp Source 02/25/16 0958 Oral     SpO2 02/25/16 0958 100 %  Weight 02/25/16 0958 130 lb (58.968 kg)     Height 02/25/16 0958 5\' 2"  (1.575 m)     Head Cir --      Peak Flow --      Pain Score 02/25/16 0959 10     Pain Loc --      Pain Edu? --      Excl. in [place]? --     Constitutional: Alert and oriented. Well appearing and in no acute distress. Musculoskeletal: Left upper arm with limited range of motion and increased pain with abduction. Point tenderness noted to the humeral head region. Neurologic:  Normal speech and language. No gross focal neurologic deficits are appreciated. No gait instability. Skin:  Skin is warm, dry and intact. No rash noted. No ecchymosis or bruising noted Psychiatric: Mood and affect are normal. Speech and behavior are normal.  ____________________________________________   LABS (all labs ordered are listed, but only abnormal results  are displayed)  Labs Reviewed - No data to display ____________________________________________  EKG   ____________________________________________  RADIOLOGY  IMPRESSION: Mildly displaced fracture of the surgical neck of the left proximal humerus. ____________________________________________   PROCEDURES  Procedure(s) performed: None  Critical Care performed: No  ____________________________________________   INITIAL IMPRESSION / ASSESSMENT AND PLAN / ED COURSE  Pertinent labs & imaging results that were available during my care of the patient were reviewed by me and considered in my medical decision making (see chart for details).  Right humeral fracture. Discussed all clinical findings with orthopedics on call. We'll place patient in the shoulder immobilizer and discharged home with prescription for Motrin and Vicodin. She is to follow-up with surgery next week. She voices no other emergency medical complaints at this time ____________________________________________   FINAL CLINICAL IMPRESSION(S) / ED DIAGNOSES  Final diagnoses:  Humeral fracture, left, closed, initial encounter     This chart was dictated using voice recognition software/Dragon. Despite best efforts to proofread, errors can occur which can change the meaning. Any change was purely unintentional.   Arlyss Repress, PA-C 02/25/16 1107  Harvest Dark, MD 02/25/16 1505

## 2017-11-13 ENCOUNTER — Emergency Department: Payer: Medicaid Other

## 2017-11-13 ENCOUNTER — Encounter: Payer: Self-pay | Admitting: Emergency Medicine

## 2017-11-13 ENCOUNTER — Emergency Department
Admission: EM | Admit: 2017-11-13 | Discharge: 2017-11-13 | Disposition: A | Discharge status: Home / Self Care | Payer: Medicaid Other | Attending: Emergency Medicine | Admitting: Emergency Medicine

## 2017-11-13 DIAGNOSIS — Z79899 Other long term (current) drug therapy: Secondary | ICD-10-CM | POA: Insufficient documentation

## 2017-11-13 DIAGNOSIS — Z960 Presence of urogenital implants: Secondary | ICD-10-CM | POA: Insufficient documentation

## 2017-11-13 DIAGNOSIS — Z794 Long term (current) use of insulin: Secondary | ICD-10-CM | POA: Diagnosis not present

## 2017-11-13 DIAGNOSIS — I10 Essential (primary) hypertension: Secondary | ICD-10-CM | POA: Insufficient documentation

## 2017-11-13 DIAGNOSIS — E119 Type 2 diabetes mellitus without complications: Secondary | ICD-10-CM | POA: Diagnosis not present

## 2017-11-13 DIAGNOSIS — R531 Weakness: Secondary | ICD-10-CM | POA: Diagnosis present

## 2017-11-13 DIAGNOSIS — M25551 Pain in right hip: Secondary | ICD-10-CM | POA: Diagnosis not present

## 2017-11-13 DIAGNOSIS — N390 Urinary tract infection, site not specified: Secondary | ICD-10-CM | POA: Insufficient documentation

## 2017-11-13 DIAGNOSIS — J45909 Unspecified asthma, uncomplicated: Secondary | ICD-10-CM | POA: Insufficient documentation

## 2017-11-13 DIAGNOSIS — M25552 Pain in left hip: Secondary | ICD-10-CM | POA: Insufficient documentation

## 2017-11-13 DIAGNOSIS — Z978 Presence of other specified devices: Secondary | ICD-10-CM

## 2017-11-13 LAB — URINALYSIS, COMPLETE (UACMP) WITH MICROSCOPIC
Bilirubin Urine: NEGATIVE
Glucose, UA: NEGATIVE mg/dL
KETONES UR: NEGATIVE mg/dL
NITRITE: NEGATIVE
PH: 6 (ref 5.0–8.0)
Protein, ur: 30 mg/dL — AB
SPECIFIC GRAVITY, URINE: 1.017 (ref 1.005–1.030)

## 2017-11-13 LAB — CBC
HCT: 34.2 % — ABNORMAL LOW (ref 35.0–47.0)
Hemoglobin: 11.5 g/dL — ABNORMAL LOW (ref 12.0–16.0)
MCH: 26.4 pg (ref 26.0–34.0)
MCHC: 33.5 g/dL (ref 32.0–36.0)
MCV: 78.8 fL — ABNORMAL LOW (ref 80.0–100.0)
PLATELETS: 436 10*3/uL (ref 150–440)
RBC: 4.34 MIL/uL (ref 3.80–5.20)
RDW: 15.4 % — AB (ref 11.5–14.5)
WBC: 7.2 10*3/uL (ref 3.6–11.0)

## 2017-11-13 LAB — BASIC METABOLIC PANEL
Anion gap: 7 (ref 5–15)
BUN: 24 mg/dL — AB (ref 6–20)
CALCIUM: 9.5 mg/dL (ref 8.9–10.3)
CO2: 26 mmol/L (ref 22–32)
CREATININE: 0.87 mg/dL (ref 0.44–1.00)
Chloride: 99 mmol/L — ABNORMAL LOW (ref 101–111)
GFR calc Af Amer: >60 mL/min (ref >60)
Glucose, Bld: 154 mg/dL — ABNORMAL HIGH (ref 65–99)
Potassium: 3.9 mmol/L (ref 3.5–5.1)
SODIUM: 132 mmol/L — AB (ref 135–145)

## 2017-11-13 MED ORDER — ACETAMINOPHEN 500 MG PO TABS
1000.0000 mg | ORAL_TABLET | Freq: Once | ORAL | Status: AC
Start: 2017-11-13 — End: 2017-11-13
  Administered 2017-11-13: 1000 mg via ORAL
  Filled 2017-11-13: qty 2

## 2017-11-13 MED ORDER — LISINOPRIL 10 MG PO TABS: 10.0000 mg | ORAL_TABLET | Freq: Every day | ORAL | 2 refills | Status: DC

## 2017-11-13 MED ORDER — NITROFURANTOIN MONOHYD MACRO 100 MG PO CAPS: 100.0000 mg | ORAL_CAPSULE | Freq: Two times a day (BID) | ORAL | 0 refills | Status: AC

## 2017-11-13 MED ORDER — INSULIN NPH ISOPHANE & REGULAR (70-30) 100 UNIT/ML ~~LOC~~ SUSP: 10.0000 [IU] | Freq: Two times a day (BID) | SUBCUTANEOUS | 0 refills | Status: DC

## 2017-11-13 MED ORDER — LISINOPRIL 10 MG PO TABS
10.0000 mg | ORAL_TABLET | Freq: Once | ORAL | Status: AC
  Administered 2017-11-13: 10 mg via ORAL
  Filled 2017-11-13: qty 1

## 2017-11-13 MED ORDER — NITROFURANTOIN MONOHYD MACRO 100 MG PO CAPS
100.0000 mg | ORAL_CAPSULE | Freq: Once | ORAL | Status: AC
  Administered 2017-11-13: 100 mg via ORAL
  Filled 2017-11-13: qty 1

## 2017-11-13 NOTE — ED Triage Notes (Signed)
Pt comes into the ED via [facility] from home c/o back pain that goes through the left hip and down the left leg, patient has also had increased weakness to bilateral legs.  Patient has foley catheter that was placed in November due to a whole in her bladder.  Patient denies any h/o kidney related problems.  Patient had difficulty standing from stretcher to pivot to wheelchair and was a two person assist.  Patient has even and unlabored respirations at this time and in NAD.

## 2017-11-13 NOTE — ED Notes (Signed)
Waiting on [facility] to transport home ? ?

## 2017-11-13 NOTE — ED Notes (Signed)
esign not working, pt verbalized discharge instructions and has no questions at this time 

## 2017-11-13 NOTE — ED Provider Notes (Addendum)
[facility] Emergency Department Provider Note  ____________________________________________  Time seen: Approximately 2:41 PM  I have reviewed the triage vital signs and the nursing notes.   HISTORY  Chief Complaint Back Pain and Weakness   HPI Julia Shea is a 62 y.o. female history of type 2 diabetes, HTN, HLD who presents for evaluation of bilateral leg weakness and bilateral hip pain. Patient tells me that she was hospitalized back in November in the setting of DKA and UTI in [place], [place]. According to patient during that hospitalizationshe was found to have a "hole in her bladder" for which she is followed by urology in [place]. She has had a Foley catheter since November. Patient reports that since November she has had bilateral hip pain radiating down her legs. The pain is constant, sharp and worse with weightbearing and ambulation. Patient denies back pain even though triage process back she points to her bilateral lateral proximal femur area as the site of her pain. Patient denies saddle anesthesia. She denies stool incontinence or retention. Patient denies dysuria, abdominal pain, fever or chills, nausea or vomiting. No chest pain or shortness of breath. Patient reports several falls due to bilateral hip pain and the fact that her legs give out. She has a walker and lives alone. She is helped by a local friend  Past Medical History:  Diagnosis Date  . Asthma   . Depression   . Diabetes mellitus   . Hypertension   . MRSA infection     Patient Active Problem List   Diagnosis Date Noted  . Hepatic steatosis 05/15/2013  . Abdominal pain, right upper quadrant 05/07/2013  . Breast cancer screening 11/14/2011  . Hypertriglyceridemia 10/30/2011  . Depression 10/26/2011  . Diabetes mellitus type 2, uncontrolled ([place]) 10/26/2011  . Hypertension 10/26/2011    Past Surgical History:  Procedure Laterality Date  . LAPAROSCOPIC OOPHORECTOMY     removal of cyst    Prior to Admission medications   Medication Sig Start Date End Date Taking? Authorizing Provider  HYDROcodone-acetaminophen (NORCO) 5-325 MG tablet Take 1-2 tablets by mouth every 4 (four) hours as needed for moderate pain. 02/25/16   Beers, Pierce Crane, PA-C  ibuprofen (ADVIL,MOTRIN) 800 MG tablet Take 1 tablet (800 mg total) by mouth every 8 (eight) hours as needed. 02/25/16   Beers, Pierce Crane, PA-C  Insulin Pen Needle (NOVOFINE) 32G X 6 MM MISC 1 Units by Does not apply route as needed. 10/30/11   Jackolyn Confer, MD  lisinopril (PRINIVIL,ZESTRIL) 10 MG tablet TAKE ONE TABLET BY MOUTH ONCE DAILY * CONTACT MD FOR YEARLY EXAM* 10/08/13   Jackolyn Confer, MD  metFORMIN (GLUCOPHAGE) 500 MG tablet TAKE ONE TABLET BY MOUTH TWICE DAILY WITH A MEAL 10/31/13   Jackolyn Confer, MD  pantoprazole (PROTONIX) 40 MG tablet Take 1 tablet (40 mg total) by mouth daily. 05/15/13   Jackolyn Confer, MD  citalopram (CELEXA) 40 MG tablet Take 1 tablet (40 mg total) by mouth daily. 05/07/13 02/25/16  Jackolyn Confer, MD  fenofibrate (TRICOR) 48 MG tablet TAKE ONE TABLET BY MOUTH ONCE DAILY 06/14/13 02/25/16  Jackolyn Confer, MD  insulin detemir (LEVEMIR) 100 UNIT/ML injection Inject 0.3 mL (30 Units total) into the skin at bedtime. 05/15/13 02/25/16  Jackolyn Confer, MD    Allergies Penicillins and Sulfa antibiotics  Family History  Problem Relation Age of Onset  . Diabetes Maternal Uncle     Social History Social History   Tobacco  Use  . Smoking status: Never Smoker  . Smokeless tobacco: Never Used  Substance Use Topics  . Alcohol use: No  . Drug use: No    Review of Systems  Constitutional: Negative for fever. Eyes: Negative for visual changes. ENT: Negative for sore throat. Neck: No neck pain  Cardiovascular: Negative for chest pain. Respiratory: Negative for shortness of breath. Gastrointestinal: Negative for abdominal pain, vomiting or diarrhea. Genitourinary:  Negative for dysuria. Musculoskeletal: Negative for back pain. + b/l hip pain Skin: Negative for rash. Neurological: Negative for headaches, weakness or numbness. Psych: No SI or HI  ____________________________________________   PHYSICAL EXAM:  VITAL SIGNS: ED Triage Vitals  Enc Vitals Group     BP 11/13/17 1135 (!) 171/88     Pulse Rate 11/13/17 1135 81     Resp 11/13/17 1135 18     Temp 11/13/17 1135 97.7 F (36.5 C)     Temp Source 11/13/17 1135 Oral     SpO2 11/13/17 1135 99 %     Weight 11/13/17 1137 115 lb (52.2 kg)     Height 11/13/17 1137 5\' 2"  (1.575 m)     Head Circumference --      Peak Flow --      Pain Score 11/13/17 1137 10     Pain Loc --      Pain Edu? --      Excl. in [place]? --     Constitutional: Alert and oriented. Well appearing and in no apparent distress. HEENT:      Head: Normocephalic and atraumatic.         Eyes: Conjunctivae are normal. Sclera is non-icteric.       Mouth/Throat: Mucous membranes are moist.       Neck: Supple with no signs of meningismus. Cardiovascular: Regular rate and rhythm. No murmurs, gallops, or rubs. 2+ symmetrical distal pulses are present in all extremities. No JVD. Respiratory: Normal respiratory effort. Lungs are clear to auscultation bilaterally. No wheezes, crackles, or rhonchi.  Gastrointestinal: Soft, non tender, and non distended with positive bowel sounds. No rebound or guarding. Genitourinary: No CVA tenderness. Foley in place draining cloudy urine. Musculoskeletal: Patient has full painless range of motion of bilateral hips, pain is not reproducible on exam, she has no CT and L-spine tenderness  Neurologic: Normal speech and language. Face is symmetric. Patient is able to elevated legs from the bed for few seconds and then dropped both legs due to pain in the lateral proximal femur region, normal muscle tone, 1+ DTRs bilaterally.  Skin: Skin is warm, dry and intact. No rash noted. Psychiatric: Mood and affect are  normal. Speech and behavior are normal.  ____________________________________________   LABS (all labs ordered are listed, but only abnormal results are displayed)  Labs Reviewed  BASIC METABOLIC PANEL - Abnormal; Notable for the following components:      Result Value   Sodium 132 (*)    Chloride 99 (*)    Glucose, Bld 154 (*)    BUN 24 (*)    All other components within normal limits  CBC - Abnormal; Notable for the following components:   Hemoglobin 11.5 (*)    HCT 34.2 (*)    MCV 78.8 (*)    RDW 15.4 (*)    All other components within normal limits  URINE CULTURE  URINALYSIS, COMPLETE (UACMP) WITH MICROSCOPIC   ____________________________________________  EKG  ED ECG REPORT I, Rudene Re, the attending physician, personally viewed and interpreted this ECG.  NSR, rate 80, normal intervals, normal axis, no STE or depression ____________________________________________  RADIOLOGY  Interpreted by me: XR b/l hip: PND  XR lumbar spine: PND   Interpretation by Radiologist:  Dg Hip Unilat W Or Wo Pelvis 2-3 Views Left  Result Date: 11/13/2017 CLINICAL DATA:  Bilateral hip pain EXAM: DG HIP (WITH OR WITHOUT PELVIS) 2-3V LEFT COMPARISON:  None. FINDINGS: Negative for fracture or bone lesion. No significant degenerative change. Mild arterial calcification. IMPRESSION: Negative. Electronically Signed   By: Franchot Gallo M.D.   On: 11/13/2017 15:22   Dg Hip Unilat W Or Wo Pelvis 2-3 Views Right  Result Date: 11/13/2017 CLINICAL DATA:  Back pain bilateral hip pain EXAM: DG HIP (WITH OR WITHOUT PELVIS) 2-3V RIGHT COMPARISON:  None FINDINGS: Negative for fracture or mass. Small calcification lateral to the greater troch greater trochanter compatible with calcific tendinitis. Arterial calcification. . 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 No acute skeletal abnormality. Electronically Signed   By: Franchot Gallo M.D.   On:  11/13/2017 15:22      ____________________________________________   PROCEDURES  Procedure(s) performed: None Procedures Critical Care performed:  None ____________________________________________   INITIAL IMPRESSION / ASSESSMENT AND PLAN / ED COURSE  62 y.o. female history of type 2 diabetes, HTN, HLD who presents for evaluation of bilateral leg weakness and bilateral hip pain. Patient has an indwelling Foley catheter since November which has not been changed. She has cloudy urine inside. She is complaining of bilateral hip pain and leg weakness. She is able to elevate her legs from the bed however they drop after just a few seconds, she has 1+ DTRs in bilateral lower extremities. She has full painless range of motion of her hips and no tenderness midline spine. Patient does have a history of arthritis which could be the reason why she has pain in her hips and possibly exacerbated by UTI with indwelling catheter and cloudy urine. There is no clinical evidence of cauda equina at this time. XR of b/l hips and lumbar spine pending. UA pending. Labs with no evidence of dehydration or DKA. Patient needs referral to PCP. If UA positive will change Foley and treat.   _________________________ 3:25 PM on 11/13/2017 -----------------------------------------  Care transferred to Dr. Kerman Passey.      As part of my medical decision making, I reviewed the following data within the [facility] notes reviewed and incorporated, Labs reviewed , Patient signed out to Dr. Kerman Passey, Notes from prior ED visits and [place] Controlled Substance Database    Pertinent labs & imaging results that were available during my care of the patient were reviewed by me and considered in my medical decision making (see chart for details).    ____________________________________________   FINAL CLINICAL IMPRESSION(S) / ED DIAGNOSES  Final diagnoses:  Bilateral hip pain  Indwelling  Foley catheter present      NEW MEDICATIONS STARTED DURING THIS VISIT:  ED Discharge Orders    None       Note:  This document was prepared using Dragon voice recognition software and may include unintentional dictation errors.    Alfred Levins, [place], MD 11/13/17 [place], [place], MD 11/13/17 [phone]

## 2017-11-13 NOTE — ED Notes (Signed)
Patient transported to X-ray 

## 2017-11-13 NOTE — ED Provider Notes (Signed)
-----------------------------------------   4:29 PM on 11/13/2017 -----------------------------------------  Patient care assumed from Dr. Alfred Levins.  Patient's urinalysis has resulted positive for urinary tract infection, patient has a chronic indwelling Foley catheter that has been in place for 3 months.  We will remove the Foley catheter replaced with a new Foley catheter and have the patient follow-up with her urologist.  We will place the patient on Macrobid, kidney function is normal.  Patient's white blood cell count is normal.  Patient's x-rays show degenerative changes but no acute abnormality such as fracture or dislocation.  During my evaluation of the patient she is moving her legs around well with no apparent discomfort.  Patient is also requesting a refill of her insulin as she is almost out and does not have a doctor to follow-up with.  I discussed with patient importance of obtaining a primary care doctor in the next several weeks, she is agreeable to this plan.  I also discussed following up with an orthopedist if she continues to have hip discomfort while ambulating.  Patient agreeable to that plan as well.  Overall the patient appears well, she is hypertensive states she has been on lisinopril in the past but discontinued this approximately 2-3 years ago due to insurance running out.  I discussed with the patient that lisinopril is available on the $4 list at [facility] and I will prescribe her several months of lisinopril until she can get in with a primary care doctor.  Overall patient's lab work is largely nonrevealing besides urinary tract infection.  Overall appears well, vitals are normal besides moderate hypertension.   Harvest Dark, MD 11/13/17 [phone]

## 2017-11-13 NOTE — ED Notes (Signed)
FN: pt to ed via ems with reports of low back pain, frequent falls lately. Ems reports all VS.

## 2017-11-13 NOTE — Discharge Instructions (Signed)
Please call the number provided to arrange an appointment with a primary care physician as soon as possible.  Please take your antibiotics as prescribed for their entire duration.  Return to the emergency department for any acutely concerning symptoms.

## 2017-11-17 LAB — URINE CULTURE: Culture: >=100000 — AB

## 2017-11-18 NOTE — Progress Notes (Signed)
ED Culture report showing enterococcus faecalis and pseudomonas putida. Was discharged on nitrofurantoin which would cover enterococcus but not pseudomonas. Spoke with Dr. Alfred Levins in ED - okay to stop nitrofurantoin and start ciprofloxacin 500 mg po BID x 7 days.  Left message on Mrs. Simonet's cell phone to call back and left my call back number, no patient details given.  Mrs. Morton Stall calls back. Asked her to stop taking nitrofurantoin. Told her we will switch to ciprofloxacin. She has used that in the past and had no questions about it. Asked me to [facility] the [facility] [place].  Spoke with Alphonzo Lemmings at [facility]. Ciprofloxacin 500 mg po BID x 7 days, #14, no refills. He says he'll start working on it and the system will text Mrs. Fennimore when prescription is ready to pick up.  Jordan Pardini A. [place], [place].D., BCPS Clinical Pharmacist 11/18/2017 14:36

## 2017-11-27 ENCOUNTER — Other Ambulatory Visit: Payer: Self-pay

## 2017-11-27 ENCOUNTER — Emergency Department: Payer: Medicaid Other

## 2017-11-27 ENCOUNTER — Encounter: Payer: Self-pay | Admitting: Emergency Medicine

## 2017-11-27 ENCOUNTER — Emergency Department
Admission: EM | Admit: 2017-11-27 | Discharge: 2017-11-27 | Disposition: A | Discharge status: Home / Self Care | Payer: Medicaid Other | Attending: Emergency Medicine | Admitting: Emergency Medicine

## 2017-11-27 DIAGNOSIS — M25552 Pain in left hip: Secondary | ICD-10-CM | POA: Insufficient documentation

## 2017-11-27 DIAGNOSIS — E119 Type 2 diabetes mellitus without complications: Secondary | ICD-10-CM | POA: Insufficient documentation

## 2017-11-27 DIAGNOSIS — M25551 Pain in right hip: Secondary | ICD-10-CM | POA: Diagnosis present

## 2017-11-27 DIAGNOSIS — Z794 Long term (current) use of insulin: Secondary | ICD-10-CM | POA: Insufficient documentation

## 2017-11-27 DIAGNOSIS — Z7409 Other reduced mobility: Secondary | ICD-10-CM | POA: Diagnosis not present

## 2017-11-27 DIAGNOSIS — I10 Essential (primary) hypertension: Secondary | ICD-10-CM | POA: Diagnosis not present

## 2017-11-27 DIAGNOSIS — Z79899 Other long term (current) drug therapy: Secondary | ICD-10-CM | POA: Diagnosis not present

## 2017-11-27 DIAGNOSIS — J45909 Unspecified asthma, uncomplicated: Secondary | ICD-10-CM | POA: Insufficient documentation

## 2017-11-27 HISTORY — DX: Bladder disorder, unspecified: N32.9

## 2017-11-27 HISTORY — DX: Presence of other specified devices: Z97.8

## 2017-11-27 HISTORY — DX: Presence of urogenital implants: Z96.0

## 2017-11-27 LAB — COMPREHENSIVE METABOLIC PANEL
ALT: 12 U/L — ABNORMAL LOW (ref 14–54)
AST: 18 U/L (ref 15–41)
Albumin: 3.9 g/dL (ref 3.5–5.0)
Alkaline Phosphatase: 74 U/L (ref 38–126)
Anion gap: 8 (ref 5–15)
BILIRUBIN TOTAL: 0.6 mg/dL (ref 0.3–1.2)
BUN: 25 mg/dL — AB (ref 6–20)
CO2: 24 mmol/L (ref 22–32)
Calcium: 9.5 mg/dL (ref 8.9–10.3)
Chloride: 100 mmol/L — ABNORMAL LOW (ref 101–111)
Creatinine, Ser: 0.48 mg/dL (ref 0.44–1.00)
GFR calc Af Amer: >60 mL/min (ref >60)
Glucose, Bld: 169 mg/dL — ABNORMAL HIGH (ref 65–99)
POTASSIUM: 3.7 mmol/L (ref 3.5–5.1)
Sodium: 132 mmol/L — ABNORMAL LOW (ref 135–145)
TOTAL PROTEIN: 7.6 g/dL (ref 6.5–8.1)

## 2017-11-27 LAB — CBC WITH DIFFERENTIAL/PLATELET
BASOS ABS: 0.1 10*3/uL (ref 0–0.1)
Basophils Relative: 1 %
Eosinophils Absolute: 0.2 10*3/uL (ref 0–0.7)
Eosinophils Relative: 2 %
HEMATOCRIT: 36.3 % (ref 35.0–47.0)
Hemoglobin: 12.3 g/dL (ref 12.0–16.0)
LYMPHS ABS: 2 10*3/uL (ref 1.0–3.6)
LYMPHS PCT: 26 %
MCH: 26.3 pg (ref 26.0–34.0)
MCHC: 33.9 g/dL (ref 32.0–36.0)
MCV: 77.5 fL — AB (ref 80.0–100.0)
MONO ABS: 0.5 10*3/uL (ref 0.2–0.9)
MONOS PCT: 6 %
NEUTROS ABS: 5.1 10*3/uL (ref 1.4–6.5)
Neutrophils Relative %: 65 %
Platelets: 334 10*3/uL (ref 150–440)
RBC: 4.69 MIL/uL (ref 3.80–5.20)
RDW: 15.6 % — AB (ref 11.5–14.5)
WBC: 7.9 10*3/uL (ref 3.6–11.0)

## 2017-11-27 LAB — URINALYSIS, ROUTINE W REFLEX MICROSCOPIC
Bacteria, UA: NONE SEEN
Bilirubin Urine: NEGATIVE
Glucose, UA: NEGATIVE mg/dL
Ketones, ur: NEGATIVE mg/dL
Nitrite: NEGATIVE
PH: 5 (ref 5.0–8.0)
Protein, ur: 30 mg/dL — AB
SPECIFIC GRAVITY, URINE: 1.018 (ref 1.005–1.030)

## 2017-11-27 MED ORDER — ACETAMINOPHEN 500 MG PO TABS
1000.0000 mg | ORAL_TABLET | Freq: Once | ORAL | Status: AC
  Administered 2017-11-27: 1000 mg via ORAL
  Filled 2017-11-27: qty 2

## 2017-11-27 MED ORDER — NITROFURANTOIN MONOHYD MACRO 100 MG PO CAPS: 100.0000 mg | ORAL_CAPSULE | Freq: Two times a day (BID) | ORAL | 0 refills | Status: AC

## 2017-11-27 MED ORDER — LISINOPRIL 10 MG PO TABS
10.0000 mg | ORAL_TABLET | Freq: Once | ORAL | Status: AC
  Administered 2017-11-27: 10 mg via ORAL
  Filled 2017-11-27: qty 1

## 2017-11-27 MED ORDER — CIPROFLOXACIN HCL 500 MG PO TABS
500.0000 mg | ORAL_TABLET | Freq: Two times a day (BID) | ORAL | Status: DC
  Administered 2017-11-27: 500 mg via ORAL
  Filled 2017-11-27: qty 1

## 2017-11-27 NOTE — ED Notes (Signed)
ED Provider at bedside. 

## 2017-11-27 NOTE — Clinical Social Work Note (Signed)
CSW received consult for "lives alone, repeated falls, reports she is too weak to stand or walk." CSW met with patient and Laretta Alstrom, along with [employer]. Patient from home alone and neighbor assists. Patient was staying with brother in [place] in January, but moved back to [place] due to lack of accessibility to resources in [place]. Patient receives a little over $1200 in [place] retirement, but has no insurance. Patient states she applied for Medicaid in [place], but has not turned in any of the requested documents. Patient states the Medicaid worker stated she would apply for disability for patient, but patient is unsure if that was done. CSW stated she does not have a PCP, but has an application for [facility] at home.   Patient stated she cannot pay privately for short-term rehab and does not have insurance. Patient stated her friend would be able to stay with her and provide assistance with patient's ADLs. Ms. Edison Pace stated agreement. CSW discussed making a referral to [facility], transportation resources, and the [employer]. Patient mentioned she feels depressed about her situation. CSW provided emotional support and brief solution focused counseling. CSW discussed Social Worker-Heather Simpson's role at [employer] and asked if CSW can contact her to make a referral. Patient agreed. CSW staffed with [facility] Social Worker Julian Hy. RN Stanton Kidney and CSW assisted patient with completing [facility] application to send directly to [facility]. Heather also set up appointment for patient to meet with Nurse Practitioner [facility] on 12/05/17 at 2pm and Social Worker Heather on 12/05/17 at 3pm. Patient states she will contact [name] to schedule transportation for these appointments, but if not available patient's friend will transport patient to appointments. CSW left a voicemail with the [employer], but informed patient PCP would have to make referral, per voicemail message. CSW  also encouraged patient to connect with Social Services in [place] for completion of Medicaid application and to apply for disability. CSW provided local resources listing, as well. CSW updated RN Stanton Kidney and Dr. Clearnce Hasten. Patient to discharge home via EMS. CSW signing off as no further Social Work needs identified.   Oretha Ellis, Latanya Presser, [place] Social Worker-ED [phone]

## 2017-11-27 NOTE — ED Notes (Signed)
Family at bedside. 

## 2017-11-27 NOTE — ED Notes (Signed)
Medications administered per MD order. Pt repositioned in the bed for patient comfort. Will continue to monitor for further patient needs. Pt's neighbor and friend remains at bedside at this time.

## 2017-11-27 NOTE — ED Provider Notes (Addendum)
Signout from Dr. Karma Greaser in this 62 year old female with chronic pain to the left hip since November.  Plan is to follow-up with PT OT and social work consultation.  Patient also with UTI.  Given Cipro.  Physical Exam  BP (!) 181/97   Pulse 75   Temp 97.6 F (36.4 C) (Oral)   Resp 19   Ht 5\' 2"  (1.575 m)   Wt 52.2 kg (115 lb)   SpO2 100%   BMI 21.03 kg/m  ----------------------------------------- 11:33 AM on 11/27/2017 -----------------------------------------   Physical Exam Is without distress at this time.  Resting comfortably. ED Course/Procedures     Procedures  MDM  Patient will be treated for UTI at home.  Patient is now accompanied by a friend who says that she will be able to stay with the patient to help her with her ADLs.  Patient says that she also has tried Tylenol multiple over-the-counter medications including topical salves.  We discussed chronic pain management the need to follow-up with primary care and possible referral to pain management.  The patient is understanding of this plan willing to comply.  Will be discharged at this time.  Social work is also discussed the case [facility] and the patient will be following up there for primary care.       Orbie Pyo, MD 11/27/17 1134  Patient will be discharged on Macrobid as to not interfere with her ciprofloxacin.  Past cultures were pansensitive.   Orbie Pyo, MD 11/27/17 1136

## 2017-11-27 NOTE — ED Notes (Signed)
Patient transported to CT 

## 2017-11-27 NOTE — ED Notes (Signed)
This RN to bedside, apologized for delay, explained that social worker was working with another patient but would see patient soon. Pt asking for something for pain for her L hip, pt noted to continue to be hypertensive. This RN spoke with MD regarding patient concerns.

## 2017-11-27 NOTE — ED Provider Notes (Signed)
[facility] Emergency Department Provider Note  ____________________________________________   First MD Initiated Contact with Patient 11/27/17 [phone]     (approximate)  I have reviewed the triage vital signs and the nursing notes.   HISTORY  Chief Complaint Hip Pain (bilateral)    HPI Julia Shea is a 62 y.o. female with medical history as listed below who presents by EMS for evaluation of multiple recent falls and bilateral hip pain as well as pain in her posterior left upper leg.  She states that she has been in pain since November (about 4 months) but nobody listens to her.  She reports that the pain is been getting gradually worse over time.  She has a walker but states that over the last few days she has become unable to even stand up or walk with a walker.  The pain and generalized weakness is too great and she has to sit back down or fall down.  She called ambulance tonight for that reason.  She was seen in the emergency department about 2 weeks ago for the same symptoms and had normal radiographs.  She was started on antibiotics for her UTI (chronic Foley), her blood pressure medicine and diabetes medicine was refilled, and she was strongly encouraged to establish primary care doctor and establish care with a urologist.    She reports that she is not been able to do either.  She is working through the paperwork but finds it difficult to do so.  She called EMS tonight and asked them to take her to [facility] because she knows they have charity care but they were unable to transport her outside of the county.  Denies fever/chills, chest pain, shortness of breath, nausea, vomiting, and abdominal pain.  No diarrhea or fecal incontinence.  No numbness or tingling in her extremities, just the pain.  she denies any injuries from her falls, just reports that her legs are too painful and she has too much generalized weakness to be able to stand.    Past Medical  History:  Diagnosis Date  . Asthma   . 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 Chronic indwelling Foley catheter   . Depression   . Diabetes mellitus   . Hypertension   . MRSA infection     Patient Active Problem List   Diagnosis Date Noted  . Hepatic steatosis 05/15/2013  . Abdominal pain, right upper quadrant 05/07/2013  . Breast cancer screening 11/14/2011  . Hypertriglyceridemia 10/30/2011  . Depression 10/26/2011  . Diabetes mellitus type 2, uncontrolled ([place]) 10/26/2011  . Hypertension 10/26/2011    Past Surgical History:  Procedure Laterality Date  . LAPAROSCOPIC OOPHORECTOMY     removal of cyst    Prior to Admission medications   Medication Sig Start Date End Date Taking? Authorizing Provider  glimepiride (AMARYL) 1 MG tablet Take 1 mg by mouth daily. 10/16/17   [provider]  HYDROcodone-acetaminophen (NORCO) 5-325 MG tablet Take 1-2 tablets by mouth every 4 (four) hours as needed for moderate pain. Patient not taking: Reported on 11/13/2017 02/25/16   Arlyss Repress, PA-C  ibuprofen (ADVIL,MOTRIN) 800 MG tablet Take 1 tablet (800 mg total) by mouth every 8 (eight) hours as needed. Patient not taking: Reported on 11/13/2017 02/25/16   Beers, Pierce Crane, PA-C  insulin NPH-regular Human (NOVOLIN 70/30) (70-30) 100 UNIT/ML injection Inject 10 Units into the skin 2 (two) times  daily with a meal. 11/13/17   Harvest Dark, MD  Insulin Pen Needle (NOVOFINE) 32G X 6 MM MISC 1 Units by Does not apply route as needed. 10/30/11   Jackolyn Confer, MD  lisinopril (PRINIVIL,ZESTRIL) 10 MG tablet Take 1 tablet (10 mg total) by mouth daily. 11/13/17 02/11/18  Harvest Dark, MD  metFORMIN (GLUCOPHAGE) 500 MG tablet TAKE ONE TABLET BY MOUTH TWICE DAILY WITH A MEAL 10/31/13   Jackolyn Confer, MD  pantoprazole (PROTONIX) 40 MG tablet Take 1 tablet (40 mg total) by mouth daily. Patient not taking:  Reported on 11/13/2017 05/15/13   Jackolyn Confer, MD    Allergies Penicillins and Sulfa antibiotics  Family History  Problem Relation Age of Onset  . Diabetes Maternal Uncle     Social History Social History   Tobacco Use  . Smoking status: Never Smoker  . Smokeless tobacco: Never Used  Substance Use Topics  . Alcohol use: No  . Drug use: No    Review of Systems Constitutional: No fever/chills Eyes: No visual changes. ENT: No sore throat. Cardiovascular: Denies chest pain. Respiratory: Denies shortness of breath. Gastrointestinal: No abdominal pain.  No nausea, no vomiting.  No diarrhea.  No constipation. Genitourinary: Negative for dysuria. Musculoskeletal: Negative for neck pain.  Negative for back pain. Integumentary: Negative for rash. Neurological: Negative for headaches, focal weakness or numbness.   ____________________________________________   PHYSICAL EXAM:  VITAL SIGNS: ED Triage Vitals  Enc Vitals Group     BP 11/27/17 0615 (!) 184/104     Pulse Rate 11/27/17 0615 86     Resp 11/27/17 0619 19     Temp 11/27/17 0619 97.6 F (36.4 C)     Temp Source 11/27/17 0619 Oral     SpO2 11/27/17 0615 100 %     Weight 11/27/17 0619 52.2 kg (115 lb)     Height 11/27/17 0619 1.575 m (5\' 2" )     Head Circumference --      Peak Flow --      Pain Score 11/27/17 0619 10     Pain Loc --      Pain Edu? --      Excl. in [place]? --     Constitutional: Alert and oriented.  No acute distress.   Eyes: Conjunctivae are normal.  Head: Atraumatic. Nose: No congestion/rhinnorhea. Mouth/Throat: Mucous membranes are moist. Neck: No stridor.  No meningeal signs.   Cardiovascular: Normal rate, regular rhythm. Good peripheral circulation. Grossly normal heart sounds. Respiratory: Normal respiratory effort.  No retractions. Lungs CTAB. Gastrointestinal: Soft and nontender. No distention.  GU:  Indwelling Foley catheter. Musculoskeletal: No lower extremity tenderness nor  edema. No gross deformities of extremities.  No tenderness to palpation of her pelvis.  No tenderness to palpation of her upper legs.  No obvious tenderness with range of motion. Neurologic:  Normal speech and language.  Patient appears to give minimal effort when asking for range of motion of her legs, but when I am not asking her to engage in an exam, she is moving her legs around without any apparent difficulty. Skin:  Skin is warm, dry and intact. No rash noted. Psychiatric: Mood and affect are normal. Speech and behavior are normal.  ____________________________________________   LABS (all labs ordered are listed, but only abnormal results are displayed)  Labs Reviewed  CBC WITH DIFFERENTIAL/PLATELET - Abnormal; Notable for the following components:      Result Value   MCV 77.5 (*)    RDW  15.6 (*)    All other components within normal limits  COMPREHENSIVE METABOLIC PANEL - Abnormal; Notable for the following components:   Sodium 132 (*)    Chloride 100 (*)    Glucose, Bld 169 (*)    BUN 25 (*)    ALT 12 (*)    All other components within normal limits  URINALYSIS, ROUTINE W REFLEX MICROSCOPIC - Abnormal; Notable for the following components:   Color, Urine YELLOW (*)    APPearance CLOUDY (*)    Hgb urine dipstick MODERATE (*)    Protein, ur 30 (*)    Leukocytes, UA LARGE (*)    Squamous Epithelial / LPF 0-5 (*)    All other components within normal limits  URINE CULTURE   ____________________________________________  EKG  None - EKG not ordered by ED physician ____________________________________________  RADIOLOGY   ED MD interpretation:  CT scan pending  Official radiology report(s): No results found.  ____________________________________________   PROCEDURES  Critical Care performed: No   Procedure(s) performed:   Procedures   ____________________________________________   INITIAL IMPRESSION / ASSESSMENT AND PLAN / ED COURSE  As part of my  medical decision making, I reviewed the following data within the [place] notes reviewed and incorporated, Labs reviewed , Old chart reviewed and Notes from prior ED visits    Differential diagnosis includes, but is not limited to, occult fracture of the pelvis or lower extremities leading to the pain she is describing, chronic arthritis, chronic deconditioning, chronic pain, acute infection secondary to bacteriuria from the indwelling Foley.  She has no respiratory signs or symptoms, no evidence of CVA, no abdominal pain.  She has no back pain to suggest a spinal issue or neurological compromise.  I suspect this is mostly a worsening of chronic symptoms along with deconditioning.  We will check lab work including sending a urine culture even though the urine specimen will certainly show evidence of bacteria given the long-term nature of the Foley catheter, even though she completed antibiotics about a week ago.  I am checking a CT scan without contrast of her pelvis to rule out any fractures.  I suspect she will most benefit from PT/OT evaluation and social work assistance to find appropriate placement or home health services.  I discussed all this with the patient and she understands.   Transferring ED care to Dr. Clearnce Hasten to follow up on workup and evaluations and disposition appropriately. ____________________________________________  FINAL CLINICAL IMPRESSION(S) / ED DIAGNOSES  Final diagnoses:  Bilateral hip pain  Decreased ambulation status     MEDICATIONS GIVEN DURING THIS VISIT:  Medications  ciprofloxacin (CIPRO) tablet 500 mg (not administered)     ED Discharge Orders    None       Note:  This document was prepared using Dragon voice recognition software and may include unintentional dictation errors.    Hinda Kehr, MD 11/27/17 [phone]

## 2017-11-27 NOTE — ED Notes (Signed)
Pt given lunch tray and juice. 

## 2017-11-27 NOTE — ED Notes (Signed)
This RN to bedside, apologized for delay in patient receiving breakfast tray, explained still waiting for social worker. Pt c/o chronic L leg pain at this time. MD made aware at this time.

## 2017-11-27 NOTE — ED Triage Notes (Addendum)
Pt bib [facility] for bilateral hip pain after falling this morning. Pt states her hip gives oout and she cannot stand to walk with walker. Pt states "I've been in pain since November, but no one listens to me". Pt reports hx bladder complications and has a foley. Pt states hx HTN

## 2017-11-27 NOTE — Care Management Note (Addendum)
Case Management Note  Patient Details  Name: Rumaysa Sabatino MRN: [id number] Date of Birth: [birth year]  Subjective/Objective:         Spoke to patient, neighbor, with [place] CSW in attendance. In talking to the patient, she already has [place] care, and is unable to get there due to hip pain , and no transportation. Indeed today she thought that the EMS she called could just take her there. I have offered her an application for the [facility], and she says she has that but has had difficulty getting the documents to them. I have offered to give her a new application for the [facility] and encouraged her to find a way there so she can avail herself of the social worker and PCP there to handle what appear to be multiple chronic issues. She is agreeable and I have also talked to her about [name], and will print out the info for her.    Further questioning with CSW in attendance reveals the patient DOES NOT already have charity care set up at [facility].   She applied for Medicaid in another county, and has not yet given them all the documents they require. Information for [name] left on chart.    Action/Plan:   Expected Discharge Date:                  Expected Discharge Plan:     In-House Referral:     Discharge planning Services     Post Acute Care Choice:    Choice offered to:     DME Arranged:    DME Agency:     HH Arranged:    HH Agency:     Status of Service:     If discussed at [employer] of Stay Meetings, dates discussed:    Additional Comments:  Beau Fanny, RN 11/27/2017, 10:47 AM

## 2017-11-27 NOTE — ED Notes (Signed)
Pt discharged home after verbalizing understanding of discharge instructions; nad noted. 

## 2017-11-28 LAB — URINE CULTURE
Culture: NO GROWTH
SPECIAL REQUESTS: NORMAL

## 2017-12-04 ENCOUNTER — Ambulatory Visit: Payer: Self-pay | Admitting: Licensed Clinical Social Worker

## 2017-12-05 ENCOUNTER — Ambulatory Visit: Payer: Self-pay | Admitting: Adult Health

## 2017-12-05 ENCOUNTER — Ambulatory Visit: Payer: Self-pay | Admitting: Licensed Clinical Social Worker

## 2017-12-11 ENCOUNTER — Telehealth: Payer: Self-pay

## 2017-12-11 NOTE — Telephone Encounter (Signed)
Pt called and states she has the rest of her elig paperwork. She would like to r/s her appts. Lm for pt to call back to r/s

## 2017-12-19 ENCOUNTER — Ambulatory Visit: Payer: Self-pay | Admitting: Licensed Clinical Social Worker

## 2017-12-19 ENCOUNTER — Ambulatory Visit: Payer: Self-pay | Admitting: Adult Health

## 2017-12-19 ENCOUNTER — Encounter: Payer: Self-pay | Admitting: Adult Health

## 2017-12-19 VITALS — BP 81/59 | HR 91 | Temp 96.9°F | Wt 116.0 lb

## 2017-12-19 DIAGNOSIS — Z960 Presence of urogenital implants: Secondary | ICD-10-CM

## 2017-12-19 DIAGNOSIS — E1159 Type 2 diabetes mellitus with other circulatory complications: Secondary | ICD-10-CM

## 2017-12-19 DIAGNOSIS — E119 Type 2 diabetes mellitus without complications: Secondary | ICD-10-CM

## 2017-12-19 DIAGNOSIS — K2100 Gastro-esophageal reflux disease with esophagitis, without bleeding: Secondary | ICD-10-CM

## 2017-12-19 DIAGNOSIS — Z978 Presence of other specified devices: Secondary | ICD-10-CM

## 2017-12-19 DIAGNOSIS — Z0000 Encounter for general adult medical examination without abnormal findings: Secondary | ICD-10-CM

## 2017-12-19 DIAGNOSIS — Z794 Long term (current) use of insulin: Principal | ICD-10-CM

## 2017-12-19 DIAGNOSIS — I10 Essential (primary) hypertension: Secondary | ICD-10-CM

## 2017-12-19 MED ORDER — CEFDINIR 300 MG PO CAPS: 300.0000 mg | ORAL_CAPSULE | Freq: Two times a day (BID) | ORAL | 0 refills | Status: DC

## 2017-12-19 MED ORDER — INSULIN SYRINGES (DISPOSABLE) U-100 0.3 ML MISC: 1.0000 | Freq: Two times a day (BID) | 5 refills | Status: DC

## 2017-12-19 NOTE — Progress Notes (Signed)
Patient: Julia Shea Female    DOB: 1956/06/18   62 y.o.   MRN: [id number] Visit Date: 12/19/2017  Today's Provider: Mary Sella, NP   Chief Complaint  Patient presents with  . New Patient (Initial Visit)    back pain, blurry vision  Chronic Foley Subjective:    HPI; 62 y/o female who presents for an initial health evaluation and f/u multiple chronic health problems.  She has a chronic Foley.  I am unable to find any information in her chart pertaining to the Foley.  He states that the Foley was placed about a month and a half ago by urology because she had " a hole in her bladder".  She states that the Foley was supposed to be changed every month but she has not had any change since she was it was placed.  She was on nitrofurantoin, Cefdinir, and ciprofloxacin at different times for the bladder infection.  She has completed the antibiotic course. She has been to the ED twice since the beginning of February for complaints of back pain and multiple falls.  She lives alone and gets assistance with activities of daily living from her neighbors and friends.  Today she is c/o back pain and nausea.  She says the nausea just occurred today but the back pain has been persistent for which she went to the emergency room multiple times.  She states that the over-the-counter remedies have not been helping.  She was given Norco at the emergency room and stated that she has run out.  She was seen in the ED on the 27th, and the 13th. She does not monitor her blood sugar at home stop she is currently on 7030 insulin and reports no hypoglycemic episodes.  She is also on Metformin.  She denies polyuria polydipsia and polyphagia.  She is a states that she does not need any refills on any of her medications today.    Allergies  Allergen Reactions  . Penicillins     Has patient had a PCN reaction causing immediate rash, facial/tongue/throat swelling, SOB or lightheadedness with hypotension: Yes Has patient  had a PCN reaction causing severe rash involving mucus membranes or skin necrosis: Yes Has patient had a PCN reaction that required hospitalization: Yes Has patient had a PCN reaction occurring within the last 10 years: Yes If all of the above answers are "NO", then may proceed with Cephalosporin use.  . Sulfa Antibiotics    Previous Medications   GLIMEPIRIDE (AMARYL) 1 MG TABLET    Take 1 mg by mouth daily with breakfast.   HYDROCODONE-ACETAMINOPHEN (NORCO) 5-325 MG TABLET    Take 1-2 tablets by mouth every 4 (four) hours as needed for moderate pain.   IBUPROFEN (ADVIL,MOTRIN) 800 MG TABLET    Take 1 tablet (800 mg total) by mouth every 8 (eight) hours as needed.   INSULIN NPH-REGULAR HUMAN (NOVOLIN 70/30) (70-30) 100 UNIT/ML INJECTION    Inject 10 Units into the skin 2 (two) times daily with a meal.   INSULIN PEN NEEDLE (NOVOFINE) 32G X 6 MM MISC    1 Units by Does not apply route as needed.   LISINOPRIL (PRINIVIL,ZESTRIL) 10 MG TABLET    Take 1 tablet (10 mg total) by mouth daily.   METFORMIN (GLUCOPHAGE) 500 MG TABLET    TAKE ONE TABLET BY MOUTH TWICE DAILY WITH A MEAL   MULTIPLE VITAMINS-MINERALS (MULTIVITAMIN WITH MINERALS) TABLET    Take 1 tablet by mouth daily.   PANTOPRAZOLE (PROTONIX)  40 MG TABLET    Take 1 tablet (40 mg total) by mouth daily.    Review of Systems  Constitutional: Positive for appetite change and unexpected weight change. Negative for chills, fatigue and fever.  HENT: Positive for sore throat. Negative for congestion and sneezing.   Eyes: Negative for visual disturbance.  Respiratory: Negative for cough, shortness of breath and wheezing.   Gastrointestinal: Positive for vomiting. Negative for constipation, diarrhea and nausea.  Endocrine: Negative for polydipsia, polyphagia and polyuria.  Genitourinary: Positive for dysuria (chronic foley).  Musculoskeletal: Positive for arthralgias, back pain and gait problem (cannot walk and uses a rolling walker).  Skin:  Negative for pallor and rash.  Neurological: Positive for dizziness. Negative for tremors, numbness and headaches.  Hematological: Negative for adenopathy.  Psychiatric/Behavioral: Negative for confusion. The patient is not nervous/anxious.     Social History   Tobacco Use  . Smoking status: Never Smoker  . Smokeless tobacco: Never Used  Substance Use Topics  . Alcohol use: No   Objective:   BP (!) 81/59   Pulse 91   Temp (!) 96.9 F (36.1 C)   Wt 116 lb (52.6 kg)   BMI 21.22 kg/m   Physical Exam  Constitutional: She is oriented to person, place, and time. She appears distressed.  Appears unkempt and cachetic  HENT:  Head: Normocephalic and atraumatic.  Mouth/Throat: Oropharynx is clear and moist.  Very poor dentition with missing teeth and extensive dental carries   Eyes: Pupils are equal, round, and reactive to light. Conjunctivae are normal.  Neck: Normal range of motion. Neck supple.  Cardiovascular: Normal rate, regular rhythm and normal heart sounds.  Pulmonary/Chest: Effort normal and breath sounds normal.  Abdominal: Soft. Bowel sounds are normal.  Genitourinary:  Genitourinary Comments: Chronic foley with cloudy urine  Musculoskeletal: Normal range of motion.  Uses rolling walker with a chair  Neurological: She is alert and oriented to person, place, and time. She has normal reflexes.  Skin: Skin is warm and dry.  Psychiatric:  Mood is depressed      Assessment & Plan:  Type 2 diabetes mellitus without complication, with long-term current use of insulin ([place])  - Plan: Continue insulin at current dose.  We will obtain routine labs.  Patient educated on signs and symptoms of hypoglycemia and appropriate interventions.  Patient is advised to go to the emergency room if she has sudden onset change in mentation  Hypertension associated with diabetes ([place])  - Plan: Blood pressure is low but patient states that she has not been eating or drinking.  I recommended  patient wait in the clinic for a recheck but she insists on going home.  She has been advised to go to the emergency room as symptoms get worse as the patient advised to hold off on any blood pressure medications until she is seen at the clinic.  gastroesophageal reflux disease with esophagitis  - Plan: Continue Protonix.  Patient advised to go to the emergency room in case of any hematemesis or  intractable nausea and vomiting  Health maintenance examination  - Plan: Fecal Occult Blood, Guaiac, Comp Met (CMET), Urinalysis, Routine w reflex microscopic, HgB A1c, Lipid Profile, Magnesium, Phosphorus  Chronic indwelling Foley catheter  - Plan:Urinalysis, Routine w reflex microscopic.  Will start on empiric antibiotics until Foley catheter is changed.  Patient has declined to stay for the Foley to be changed.  I have recommended she return to the clinic on Tuesday and see the urology  PA.  Mary Sella, NP   [facility]

## 2017-12-19 NOTE — Patient Instructions (Signed)
Indwelling Urinary Catheter Care, Adult  Take good care of your catheter to keep it working and to prevent problems.  How to wear your catheter  Attach your catheter to your leg with tape (adhesive tape) or a leg strap. Make sure it is not too tight. If you use tape, remove any bits of tape that are already on the catheter.  How to wear a drainage bag  You should have:   A large overnight bag.   A small leg bag.    Overnight Bag  You may wear the overnight bag at any time. Always keep the bag below the level of your bladder but off the floor. When you sleep, put a clean plastic bag in a wastebasket. Then hang the bag inside the wastebasket.  Leg Bag  Never wear the leg bag at night. Always wear the leg bag below your knee. Keep the leg bag secure with a leg strap or tape.  How to care for your skin   Clean the skin around the catheter at least once every day.   Shower every day. Do not take baths.   Put creams, lotions, or ointments on your genital area only as told by your doctor.   Do not use powders, sprays, or lotions on your genital area.  How to clean your catheter and your skin  1. Wash your hands with soap and water.  2. Wet a washcloth in warm water and gentle (mild) soap.  3. Use the washcloth to clean the skin where the catheter enters your body. Clean downward and wipe away from the catheter in small circles. Do not wipe toward the catheter.  4. Pat the area dry with a clean towel. Make sure to clean off all soap.  How to care for your drainage bags  Empty your drainage bag when it is ?- full or at least 2-3 times a day. Replace your drainage bag once a month or sooner if it starts to smell bad or look dirty. Do not clean your drainage bag unless told by your doctor.  Emptying a drainage bag    Supplies Needed   Rubbing alcohol.   Gauze pad or cotton ball.   Tape or a leg strap.    Steps  1. Wash your hands with soap and water.  2. Separate (detach) the bag from your leg.  3. Hold the bag over  the toilet or a clean container. Keep the bag below your hips and bladder. This stops pee (urine) from going back into the tube.  4. Open the pour spout at the bottom of the bag.  5. Empty the pee into the toilet or container. Do not let the pour spout touch any surface.  6. Put rubbing alcohol on a gauze pad or cotton ball.  7. Use the gauze pad or cotton ball to clean the pour spout.  8. Close the pour spout.  9. Attach the bag to your leg with tape or a leg strap.  10. Wash your hands.    Changing a drainage bag  Supplies Needed   Alcohol wipes.   A clean drainage bag.   Adhesive tape or a leg strap.    Steps  1. Wash your hands with soap and water.  2. Separate the dirty bag from your leg.  3. Pinch the rubber catheter with your fingers so that pee does not spill out.  4. Separate the catheter tube from the drainage tube where these tubes connect (at the   connection valve). Do not let the tubes touch any surface.  5. Clean the end of the catheter tube with an alcohol wipe. Use a different alcohol wipe to clean the end of the drainage tube.  6. Connect the catheter tube to the drainage tube of the clean bag.  7. Attach the new bag to the leg with adhesive tape or a leg strap.  8. Wash your hands.    How to prevent infection and other problems   Never pull on your catheter or try to remove it. Pulling can damage tissue in your body.   Always wash your hands before and after touching your catheter.   If a leg strap gets wet, replace it with a dry one.   Drink enough fluids to keep your pee clear or pale yellow, or as told by your doctor.   Do not let the drainage bag or tubing touch the floor.   Wear cotton underwear.   If you are female, wipe from front to back after you poop (have a bowel movement).   Check on the catheter often to make sure it works and the tubing is not twisted.  Get help if:   Your pee is cloudy.   Your pee smells unusually bad.   Your pee is not draining into the bag.   Your  tube gets clogged.   Your catheter starts to leak.   Your bladder feels full.  Get help right away if:   You have redness, swelling, or pain where the catheter enters your body.   You have fluid, pus, or a bad smell coming from the area where the catheter enters your body.   The area where the catheter enters your body feels warm.   You have a fever.   You have pain in your:  ? Stomach (abdomen).  ? Legs.  ? Lower back.  ? Bladder.   You see blood fill the catheter.   Your pee is pink or red.   You feel sick to your stomach (nauseous).   You throw up (vomit).   You have chills.   Your catheter gets pulled out.  This information is not intended to replace advice given to you by your health care provider. Make sure you discuss any questions you have with your health care provider.  Document Released: 01/12/2013 Document Revised: 08/15/2016 Document Reviewed: 03/02/2014  [employer]vier Interactive Patient Education  2018 [employer]

## 2017-12-20 LAB — COMPREHENSIVE METABOLIC PANEL
A/G RATIO: 1.4 (ref 1.2–2.2)
ALT: 135 IU/L — ABNORMAL HIGH (ref 0–32)
AST: 57 IU/L — ABNORMAL HIGH (ref 0–40)
Albumin: 4.1 g/dL (ref 3.6–4.8)
Alkaline Phosphatase: 598 IU/L — ABNORMAL HIGH (ref 39–117)
BUN/Creatinine Ratio: 33 — ABNORMAL HIGH (ref 12–28)
BUN: 35 mg/dL — ABNORMAL HIGH (ref 8–27)
Bilirubin Total: 0.4 mg/dL (ref 0.0–1.2)
CALCIUM: 10.9 mg/dL — AB (ref 8.7–10.3)
CO2: 19 mmol/L — AB (ref 20–29)
Chloride: 100 mmol/L (ref 96–106)
Creatinine, Ser: 1.06 mg/dL — ABNORMAL HIGH (ref 0.57–1.00)
GFR calc Af Amer: 65 mL/min/{1.73_m2} (ref >59)
GFR, EST NON AFRICAN AMERICAN: 56 mL/min/{1.73_m2} — AB (ref >59)
Globulin, Total: 2.9 g/dL (ref 1.5–4.5)
Glucose: 303 mg/dL — ABNORMAL HIGH (ref 65–99)
POTASSIUM: 5.2 mmol/L (ref 3.5–5.2)
Sodium: 138 mmol/L (ref 134–144)
Total Protein: 7 g/dL (ref 6.0–8.5)

## 2017-12-20 LAB — CBC WITH DIFFERENTIAL/PLATELET
Basophils Absolute: 0 10*3/uL (ref 0.0–0.2)
Basos: 0 %
EOS (ABSOLUTE): 0.2 10*3/uL (ref 0.0–0.4)
Eos: 2 %
Hematocrit: 36.7 % (ref 34.0–46.6)
Hemoglobin: 12 g/dL (ref 11.1–15.9)
IMMATURE GRANULOCYTES: 0 %
Immature Grans (Abs): 0 10*3/uL (ref 0.0–0.1)
Lymphocytes Absolute: 1.3 10*3/uL (ref 0.7–3.1)
Lymphs: 14 %
MCH: 26.3 pg — ABNORMAL LOW (ref 26.6–33.0)
MCHC: 32.7 g/dL (ref 31.5–35.7)
MCV: 81 fL (ref 79–97)
MONOS ABS: 0.4 10*3/uL (ref 0.1–0.9)
Monocytes: 4 %
NEUTROS PCT: 80 %
Neutrophils Absolute: 7.2 10*3/uL — ABNORMAL HIGH (ref 1.4–7.0)
PLATELETS: 361 10*3/uL (ref 150–379)
RBC: 4.56 x10E6/uL (ref 3.77–5.28)
RDW: 15.9 % — AB (ref 12.3–15.4)
WBC: 9.1 10*3/uL (ref 3.4–10.8)

## 2017-12-20 LAB — MAGNESIUM: Magnesium: 1.7 mg/dL (ref 1.6–2.3)

## 2017-12-20 LAB — PHOSPHORUS: PHOSPHORUS: 4.9 mg/dL — AB (ref 2.5–4.5)

## 2017-12-20 LAB — HEMOGLOBIN A1C
Est. average glucose Bld gHb Est-mCnc: 194 mg/dL
Hgb A1c MFr Bld: 8.4 % — ABNORMAL HIGH (ref 4.8–5.6)

## 2017-12-20 LAB — LIPID PANEL
CHOLESTEROL TOTAL: 243 mg/dL — AB (ref 100–199)
Chol/HDL Ratio: 8.1 ratio — ABNORMAL HIGH (ref 0.0–4.4)
HDL: 30 mg/dL — ABNORMAL LOW (ref >39)
LDL Calculated: 139 mg/dL — ABNORMAL HIGH (ref 0–99)
Triglycerides: 370 mg/dL — ABNORMAL HIGH (ref 0–149)
VLDL CHOLESTEROL CAL: 74 mg/dL — AB (ref 5–40)

## 2017-12-20 LAB — TSH: TSH: 3.22 u[IU]/mL (ref 0.450–4.500)

## 2017-12-21 ENCOUNTER — Other Ambulatory Visit: Payer: Self-pay

## 2017-12-21 ENCOUNTER — Encounter: Payer: Self-pay | Admitting: Emergency Medicine

## 2017-12-21 ENCOUNTER — Encounter: Payer: Self-pay | Admitting: Adult Health

## 2017-12-21 ENCOUNTER — Emergency Department: Payer: Medicaid Other

## 2017-12-21 ENCOUNTER — Inpatient Hospital Stay
Admission: EM | Admit: 2017-12-21 | Discharge: 2017-12-23 | DRG: 872 | Disposition: A | Discharge status: Home / Self Care | Payer: Medicaid Other | Attending: Specialist | Admitting: Specialist

## 2017-12-21 DIAGNOSIS — Z794 Long term (current) use of insulin: Secondary | ICD-10-CM | POA: Diagnosis not present

## 2017-12-21 DIAGNOSIS — A419 Sepsis, unspecified organism: Secondary | ICD-10-CM

## 2017-12-21 DIAGNOSIS — Z880 Allergy status to penicillin: Secondary | ICD-10-CM

## 2017-12-21 DIAGNOSIS — E119 Type 2 diabetes mellitus without complications: Secondary | ICD-10-CM

## 2017-12-21 DIAGNOSIS — L8990 Pressure ulcer of unspecified site, unspecified stage: Secondary | ICD-10-CM | POA: Diagnosis present

## 2017-12-21 DIAGNOSIS — E876 Hypokalemia: Secondary | ICD-10-CM | POA: Diagnosis not present

## 2017-12-21 DIAGNOSIS — Z8614 Personal history of Methicillin resistant Staphylococcus aureus infection: Secondary | ICD-10-CM | POA: Diagnosis not present

## 2017-12-21 DIAGNOSIS — Z79899 Other long term (current) drug therapy: Secondary | ICD-10-CM | POA: Diagnosis not present

## 2017-12-21 DIAGNOSIS — N390 Urinary tract infection, site not specified: Secondary | ICD-10-CM | POA: Diagnosis present

## 2017-12-21 DIAGNOSIS — E860 Dehydration: Secondary | ICD-10-CM | POA: Diagnosis present

## 2017-12-21 DIAGNOSIS — Z882 Allergy status to sulfonamides status: Secondary | ICD-10-CM | POA: Diagnosis not present

## 2017-12-21 DIAGNOSIS — N3001 Acute cystitis with hematuria: Secondary | ICD-10-CM

## 2017-12-21 DIAGNOSIS — K219 Gastro-esophageal reflux disease without esophagitis: Secondary | ICD-10-CM | POA: Diagnosis present

## 2017-12-21 DIAGNOSIS — I10 Essential (primary) hypertension: Secondary | ICD-10-CM | POA: Diagnosis present

## 2017-12-21 DIAGNOSIS — Z7984 Long term (current) use of oral hypoglycemic drugs: Secondary | ICD-10-CM

## 2017-12-21 LAB — COMPREHENSIVE METABOLIC PANEL
ALT: 81 U/L — ABNORMAL HIGH (ref 14–54)
ANION GAP: 11 (ref 5–15)
AST: 35 U/L (ref 15–41)
Albumin: 3.6 g/dL (ref 3.5–5.0)
Alkaline Phosphatase: 474 U/L — ABNORMAL HIGH (ref 38–126)
BILIRUBIN TOTAL: 0.5 mg/dL (ref 0.3–1.2)
BUN: 28 mg/dL — AB (ref 6–20)
CHLORIDE: 101 mmol/L (ref 101–111)
CO2: 21 mmol/L — ABNORMAL LOW (ref 22–32)
Calcium: 10 mg/dL (ref 8.9–10.3)
Creatinine, Ser: 0.84 mg/dL (ref 0.44–1.00)
GFR calc Af Amer: >60 mL/min (ref >60)
Glucose, Bld: 366 mg/dL — ABNORMAL HIGH (ref 65–99)
Potassium: 3.8 mmol/L (ref 3.5–5.1)
Sodium: 133 mmol/L — ABNORMAL LOW (ref 135–145)
TOTAL PROTEIN: 7.6 g/dL (ref 6.5–8.1)

## 2017-12-21 LAB — URINALYSIS, COMPLETE (UACMP) WITH MICROSCOPIC
BILIRUBIN URINE: NEGATIVE
Bacteria, UA: NONE SEEN
Glucose, UA: >=500 mg/dL — AB
Ketones, ur: NEGATIVE mg/dL
Nitrite: NEGATIVE
Protein, ur: 30 mg/dL — AB
Specific Gravity, Urine: 1.016 (ref 1.005–1.030)
pH: 5 (ref 5.0–8.0)

## 2017-12-21 LAB — CBC WITH DIFFERENTIAL/PLATELET
Basophils Absolute: 0 10*3/uL (ref 0–0.1)
Basophils Relative: 1 %
Eosinophils Absolute: 0.3 10*3/uL (ref 0–0.7)
Eosinophils Relative: 5 %
HCT: 34.9 % — ABNORMAL LOW (ref 35.0–47.0)
Hemoglobin: 12.1 g/dL (ref 12.0–16.0)
Lymphocytes Relative: 33 %
Lymphs Abs: 2 10*3/uL (ref 1.0–3.6)
MCH: 26.7 pg (ref 26.0–34.0)
MCHC: 34.5 g/dL (ref 32.0–36.0)
MCV: 77.2 fL — AB (ref 80.0–100.0)
MONOS PCT: 6 %
Monocytes Absolute: 0.4 10*3/uL (ref 0.2–0.9)
NEUTROS ABS: 3.2 10*3/uL (ref 1.4–6.5)
NEUTROS PCT: 55 %
Platelets: 384 10*3/uL (ref 150–440)
RBC: 4.53 MIL/uL (ref 3.80–5.20)
RDW: 15.7 % — AB (ref 11.5–14.5)
WBC: 5.9 10*3/uL (ref 3.6–11.0)

## 2017-12-21 LAB — GLUCOSE, CAPILLARY
GLUCOSE-CAPILLARY: 141 mg/dL — AB (ref 65–99)
Glucose-Capillary: 247 mg/dL — ABNORMAL HIGH (ref 65–99)

## 2017-12-21 LAB — LACTIC ACID, PLASMA
LACTIC ACID, VENOUS: 2.9 mmol/L — AB (ref 0.5–1.9)
LACTIC ACID, VENOUS: 2 mmol/L — AB (ref 0.5–1.9)
LACTIC ACID, VENOUS: 0.9 mmol/L (ref 0.5–1.9)

## 2017-12-21 LAB — TROPONIN I

## 2017-12-21 LAB — PROTIME-INR
INR: 0.98
Prothrombin Time: 12.9 seconds (ref 11.4–15.2)

## 2017-12-21 MED ORDER — SENNOSIDES-DOCUSATE SODIUM 8.6-50 MG PO TABS: 1.0000 | ORAL_TABLET | Freq: Every evening | ORAL | Status: DC | PRN

## 2017-12-21 MED ORDER — ACETAMINOPHEN 650 MG RE SUPP: 650.0000 mg | Freq: Four times a day (QID) | RECTAL | Status: DC | PRN

## 2017-12-21 MED ORDER — ENOXAPARIN SODIUM 40 MG/0.4ML ~~LOC~~ SOLN
40.0000 mg | SUBCUTANEOUS | Status: DC
Start: 2017-12-21 — End: 2017-12-23
  Administered 2017-12-21 – 2017-12-22 (×2): 40 mg via SUBCUTANEOUS
  Filled 2017-12-21 (×2): qty 0.4

## 2017-12-21 MED ORDER — ACETAMINOPHEN 325 MG PO TABS
650.0000 mg | ORAL_TABLET | Freq: Four times a day (QID) | ORAL | Status: DC | PRN
  Administered 2017-12-21: 650 mg via ORAL
  Filled 2017-12-21: qty 2

## 2017-12-21 MED ORDER — ONDANSETRON HCL 4 MG PO TABS
4.0000 mg | ORAL_TABLET | Freq: Four times a day (QID) | ORAL | Status: DC | PRN
  Administered 2017-12-22 – 2017-12-23 (×2): 4 mg via ORAL
  Filled 2017-12-21 (×2): qty 1

## 2017-12-21 MED ORDER — ADULT MULTIVITAMIN W/MINERALS CH
1.0000 | ORAL_TABLET | Freq: Every day | ORAL | Status: DC
  Administered 2017-12-21 – 2017-12-23 (×3): 1 via ORAL
  Filled 2017-12-21 (×3): qty 1

## 2017-12-21 MED ORDER — INSULIN ASPART 100 UNIT/ML ~~LOC~~ SOLN
0.0000 [IU] | Freq: Three times a day (TID) | SUBCUTANEOUS | Status: DC
  Administered 2017-12-21: 5 [IU] via SUBCUTANEOUS
  Administered 2017-12-22: 8 [IU] via SUBCUTANEOUS
  Administered 2017-12-22: 3 [IU] via SUBCUTANEOUS
  Administered 2017-12-22: 8 [IU] via SUBCUTANEOUS
  Administered 2017-12-23 (×2): 3 [IU] via SUBCUTANEOUS
  Filled 2017-12-21 (×6): qty 1

## 2017-12-21 MED ORDER — INSULIN ASPART 100 UNIT/ML ~~LOC~~ SOLN: 0.0000 [IU] | Freq: Every day | SUBCUTANEOUS | Status: DC

## 2017-12-21 MED ORDER — HYDROCODONE-ACETAMINOPHEN 5-325 MG PO TABS: 1.0000 | ORAL_TABLET | ORAL | Status: DC | PRN

## 2017-12-21 MED ORDER — GLIMEPIRIDE 1 MG PO TABS
1.0000 mg | ORAL_TABLET | Freq: Every day | ORAL | Status: DC
  Administered 2017-12-22 – 2017-12-23 (×2): 1 mg via ORAL
  Filled 2017-12-21 (×2): qty 1

## 2017-12-21 MED ORDER — SODIUM CHLORIDE 0.9 % IV SOLN
INTRAVENOUS | Status: DC
  Administered 2017-12-21 – 2017-12-23 (×5): via INTRAVENOUS

## 2017-12-21 MED ORDER — PANTOPRAZOLE SODIUM 40 MG PO TBEC
40.0000 mg | DELAYED_RELEASE_TABLET | Freq: Every day | ORAL | Status: DC
  Administered 2017-12-22 – 2017-12-23 (×2): 40 mg via ORAL
  Filled 2017-12-21 (×2): qty 1

## 2017-12-21 MED ORDER — SODIUM CHLORIDE 0.9 % IV SOLN: Freq: Once | INTRAVENOUS | Status: DC

## 2017-12-21 MED ORDER — INSULIN ASPART 100 UNIT/ML ~~LOC~~ SOLN
4.0000 [IU] | Freq: Three times a day (TID) | SUBCUTANEOUS | Status: DC
  Administered 2017-12-21 – 2017-12-23 (×5): 4 [IU] via SUBCUTANEOUS
  Filled 2017-12-21 (×5): qty 1

## 2017-12-21 MED ORDER — SODIUM CHLORIDE 0.9 % IV SOLN
1.0000 g | Freq: Two times a day (BID) | INTRAVENOUS | Status: DC
  Administered 2017-12-21 – 2017-12-23 (×4): 1 g via INTRAVENOUS
  Filled 2017-12-21 (×6): qty 1

## 2017-12-21 MED ORDER — HYDRALAZINE HCL 20 MG/ML IJ SOLN
INTRAMUSCULAR | Status: AC
  Filled 2017-12-21: qty 1

## 2017-12-21 MED ORDER — SODIUM CHLORIDE 0.9 % IV BOLUS (SEPSIS)
1000.0000 mL | Freq: Once | INTRAVENOUS | Status: AC
  Administered 2017-12-21: 1000 mL via INTRAVENOUS

## 2017-12-21 MED ORDER — METFORMIN HCL 500 MG PO TABS
500.0000 mg | ORAL_TABLET | Freq: Two times a day (BID) | ORAL | Status: DC
  Administered 2017-12-21 – 2017-12-23 (×5): 500 mg via ORAL
  Filled 2017-12-21 (×5): qty 1

## 2017-12-21 MED ORDER — ONDANSETRON HCL 4 MG/2ML IJ SOLN: 4.0000 mg | Freq: Four times a day (QID) | INTRAMUSCULAR | Status: DC | PRN

## 2017-12-21 MED ORDER — LISINOPRIL 10 MG PO TABS
10.0000 mg | ORAL_TABLET | Freq: Every day | ORAL | Status: DC
  Administered 2017-12-22 – 2017-12-23 (×2): 10 mg via ORAL
  Filled 2017-12-21 (×2): qty 1

## 2017-12-21 MED ORDER — SODIUM CHLORIDE 0.9 % IV SOLN
2.0000 g | Freq: Once | INTRAVENOUS | Status: AC
  Administered 2017-12-21: 2 g via INTRAVENOUS
  Filled 2017-12-21: qty 2

## 2017-12-21 MED ORDER — HYDRALAZINE HCL 20 MG/ML IJ SOLN
10.0000 mg | INTRAMUSCULAR | Status: DC | PRN
  Administered 2017-12-21 – 2017-12-22 (×2): 10 mg via INTRAVENOUS
  Filled 2017-12-21: qty 1

## 2017-12-21 NOTE — H&P (Signed)
[facility] NAME: Julia Shea    MR#:  [id number]  DATE OF BIRTH:  Jun 13, 1956  DATE OF ADMISSION:  12/21/2017  PRIMARY CARE PHYSICIAN: Patient, No Pcp Per   REQUESTING/REFERRING PHYSICIAN:   CHIEF COMPLAINT:   Chief Complaint  Patient presents with  . Abnormal Lab    HISTORY OF PRESENT ILLNESS: Julia Shea  is a 62 y.o. female with a known history of chronic indwelling Foley catheter since November last year secondary to hole in the bladder as per the patient, hypertension, diabetes mellitus, bronchial asthma [facility] patient also has some low-grade fever and chills for the last few days.. She had her Foley catheter changed in the emergency room by the nurse.  Patient was evaluated lactic acid level was elevated her urine analysis showed infection.  She was started on broad-spectrum IV antibiotics and IV fluid bolus was also given.  No complaints of any chest pain, shortness of breath.  No complaints of any hematemesis hemoptysis and rectal bleed.  PAST MEDICAL HISTORY:   Past Medical History:  Diagnosis Date  . Asthma   . 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 Chronic indwelling Foley catheter   . Depression   . Diabetes mellitus   . Hypertension   . MRSA infection     PAST SURGICAL HISTORY:  Past Surgical History:  Procedure Laterality Date  . LAPAROSCOPIC OOPHORECTOMY     removal of cyst    SOCIAL HISTORY:  Social History   Tobacco Use  . Smoking status: Never Smoker  . Smokeless tobacco: Never Used  Substance Use Topics  . Alcohol use: No    FAMILY HISTORY:  Family History  Problem Relation Age of Onset  . Diabetes Maternal Uncle     DRUG ALLERGIES:  Allergies  Allergen Reactions  . Penicillins     Has patient had a PCN reaction causing immediate rash, facial/tongue/throat swelling,  SOB or lightheadedness with hypotension: Yes Has patient had a PCN reaction causing severe rash involving mucus membranes or skin necrosis: Yes Has patient had a PCN reaction that required hospitalization: Yes Has patient had a PCN reaction occurring within the last 10 years: Yes If all of the above answers are "NO", then may proceed with Cephalosporin use.  . Sulfa Antibiotics     REVIEW OF SYSTEMS:   CONSTITUTIONAL: Had low grade fever, fatigue and weakness.  Had some chills. EYES: No blurred or double vision.  EARS, NOSE, AND THROAT: No tinnitus or ear pain.  RESPIRATORY: No cough, shortness of breath, wheezing or hemoptysis.  CARDIOVASCULAR: No chest pain, orthopnea, edema.  GASTROINTESTINAL: No nausea, vomiting, diarrhea or abdominal pain.  GENITOURINARY: No dysuria, hematuria.  Has foley catheter ENDOCRINE: No polyuria, nocturia,  HEMATOLOGY: No anemia, easy bruising or bleeding SKIN: No rash or lesion. MUSCULOSKELETAL: No joint pain or arthritis.   NEUROLOGIC: No tingling, numbness, weakness.  PSYCHIATRY: No anxiety or depression.   MEDICATIONS AT HOME:  Prior to Admission medications   Medication Sig Start Date End Date Taking? Authorizing Provider  cefdinir (OMNICEF) 300 MG capsule Take 1 capsule (300 mg total) by mouth 2 (two) times daily. 12/19/17   Mikael Spray, NP  glimepiride (AMARYL) 1 MG tablet Take 1 mg by mouth daily with breakfast.    [provider]  HYDROcodone-acetaminophen (NORCO) 5-325 MG tablet Take 1-2  tablets by mouth every 4 (four) hours as needed for moderate pain. Patient not taking: Reported on 11/13/2017 02/25/16   Arlyss Repress, PA-C  ibuprofen (ADVIL,MOTRIN) 800 MG tablet Take 1 tablet (800 mg total) by mouth every 8 (eight) hours as needed. 02/25/16   Beers, Pierce Crane, PA-C  insulin NPH-regular Human (NOVOLIN 70/30) (70-30) 100 UNIT/ML injection Inject 10 Units into the skin 2 (two) times daily with a meal. 11/13/17   Harvest Dark, MD  Insulin Pen Needle (NOVOFINE) 32G X 6 MM MISC 1 Units by Does not apply route as needed. 10/30/11   Jackolyn Confer, MD  Insulin Syringes, Disposable, U-100 0.3 ML MISC 1 Syringe by Does not apply route 2 (two) times daily. 12/19/17   Mikael Spray, NP  lisinopril (PRINIVIL,ZESTRIL) 10 MG tablet Take 1 tablet (10 mg total) by mouth daily. 11/13/17 02/11/18  Harvest Dark, MD  metFORMIN (GLUCOPHAGE) 500 MG tablet TAKE ONE TABLET BY MOUTH TWICE DAILY WITH A MEAL 10/31/13   Jackolyn Confer, MD  Multiple Vitamins-Minerals (MULTIVITAMIN WITH MINERALS) tablet Take 1 tablet by mouth daily.    [provider]  pantoprazole (PROTONIX) 40 MG tablet Take 1 tablet (40 mg total) by mouth daily. 05/15/13   Jackolyn Confer, MD      PHYSICAL EXAMINATION:   VITAL SIGNS: Blood pressure (!) 191/88, pulse 86, temperature 98.2 F (36.8 C), temperature source Oral, resp. rate 15, height 5\' 2"  (1.575 m), weight 52.2 kg (115 lb), SpO2 98 %.  GENERAL:  62 y.o.-year-old patient lying in the bed with no acute distress.  EYES: Pupils equal, round, reactive to light and accommodation. No scleral icterus. Extraocular muscles intact.  HEENT: Head atraumatic, normocephalic. Oropharynx and nasopharynx clear.  NECK:  Supple, no jugular venous distention. No thyroid enlargement, no tenderness.  LUNGS: Normal breath sounds bilaterally, no wheezing, rales,rhonchi or crepitation. No use of accessory muscles of respiration.  CARDIOVASCULAR: S1, S2 normal. No murmurs, rubs, or gallops.  ABDOMEN: Soft, nontender, nondistended. Bowel sounds present. No organomegaly or mass.  GENITOURINARY : Has foley catheter EXTREMITIES: No pedal edema, cyanosis, or clubbing.  NEUROLOGIC: Cranial nerves II through XII are intact. Muscle strength 5/5 in all extremities. Sensation intact. Gait not checked.  PSYCHIATRIC: The patient is alert and oriented x 3.  SKIN: No obvious rash, lesion, or ulcer.   LABORATORY  PANEL:   CBC Recent Labs  Lab 12/19/17 1155 12/21/17 1253  WBC 9.1 5.9  HGB 12.0 12.1  HCT 36.7 34.9*  PLT 361 384  MCV 81 77.2*  MCH 26.3* 26.7  MCHC 32.7 34.5  RDW 15.9* 15.7*  LYMPHSABS 1.3 2.0  MONOABS  --  0.4  EOSABS 0.2 0.3  BASOSABS 0.0 0.0   ------------------------------------------------------------------------------------------------------------------  Chemistries  Recent Labs  Lab 12/19/17 1155 12/21/17 1253  NA 138 133*  K 5.2 3.8  CL 100 101  CO2 19* 21*  GLUCOSE 303* 366*  BUN 35* 28*  CREATININE 1.06* 0.84  CALCIUM 10.9* 10.0  MG 1.7  --   AST 57* 35  ALT 135* 81*  ALKPHOS 598* 474*  BILITOT 0.4 0.5   ------------------------------------------------------------------------------------------------------------------ estimated creatinine clearance is 54.2 mL/min (by C-G formula based on SCr of 0.84 mg/dL). ------------------------------------------------------------------------------------------------------------------ Recent Labs    12/19/17 1155  TSH 3.220     Coagulation profile Recent Labs  Lab 12/21/17 1253  INR 0.98   ------------------------------------------------------------------------------------------------------------------- No results for input(s): DDIMER in the last 72 hours. -------------------------------------------------------------------------------------------------------------------  Cardiac Enzymes Recent Labs  Lab 12/21/17 1253  TROPONINI <0.03   ------------------------------------------------------------------------------------------------------------------ Invalid input(s): POCBNP  ---------------------------------------------------------------------------------------------------------------  Urinalysis    Component Value Date/Time   COLORURINE YELLOW (A) 12/21/2017 1432   APPEARANCEUR TURBID (A) 12/21/2017 1432   LABSPEC 1.016 12/21/2017 1432   PHURINE 5.0 12/21/2017 1432   GLUCOSEU >=500 (A)  12/21/2017 1432   HGBUR MODERATE (A) 12/21/2017 1432   BILIRUBINUR NEGATIVE 12/21/2017 1432   BILIRUBINUR neg 05/15/2013 1038   KETONESUR NEGATIVE 12/21/2017 1432   PROTEINUR 30 (A) 12/21/2017 1432   UROBILINOGEN 0.2 05/15/2013 1038   NITRITE NEGATIVE 12/21/2017 1432   LEUKOCYTESUR MODERATE (A) 12/21/2017 1432     RADIOLOGY: No results found.  EKG: Orders placed or performed during the hospital encounter of 11/13/17  . ED EKG  . ED EKG  . EKG    IMPRESSION AND PLAN:  62 year old female patient with history of hypertension, type 2 diabetes mellitus, bronchial asthma, bladder abnormality with indwelling Foley catheter presented to the emergency room [facility].  1 sepsis.  Start patient on broad-spectrum IV antibiotic IV cefepime antibiotic IV fluids Follow-up lactic acid  2.  Urinary tract infection Follow-up urine culture Foley catheter change in the emergency room IV cefepime antibiotic  3.  Dehydration IV fluid hydration  4.   Uncontrolled hypertension Control blood pressure with oral ACE inhibitor IV as needed hydralazine as needed  5.  Type II diabetes mellitus Control blood sugars with oral hypoglycemic agents and sliding scale coverage with insulin and mealtime insulin   All the records are reviewed and case discussed with ED provider. Management plans discussed with the patient, family and they are in agreement.  CODE STATUS:FULL CODE    TOTAL TIME TAKING CARE OF THIS PATIENT: 53 minutes.    Saundra Shelling M.D on 12/21/2017 at 4:11 PM  Between 7am to 6pm - Pager - [phone]  After 6pm go to [email or web address] - password [email or web address] [facility]  Office  [phone]  CC: Primary care physician; Patient, No Pcp Per

## 2017-12-21 NOTE — ED Provider Notes (Addendum)
[facility] Emergency Department Provider Note    First MD Initiated Contact with Patient 12/21/17 1359     (approximate)  I have reviewed the triage vital signs and the nursing notes.   HISTORY  Chief Complaint Abnormal Lab    HPI Julia Shea is a 62 y.o. female with a history of chronic indwelling Foley with reported "hole in her bladder "presents for abnormal labs.  Patient is mildly confused and unreliable historian.  Denies any nausea or vomiting.  Was noted to have UTI in outpatient clinic this past week and was given prescription for antibiotics but did not start these.  Has had fevers and chills at home.  Denies any chest pain or shortness of breath.  No rashes.  Currently lives at home.  Past Medical History:  Diagnosis Date  . Asthma   . 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 Chronic indwelling Foley catheter   . Depression   . Diabetes mellitus   . Hypertension   . MRSA infection    Family History  Problem Relation Age of Onset  . Diabetes Maternal Uncle    Past Surgical History:  Procedure Laterality Date  . LAPAROSCOPIC OOPHORECTOMY     removal of cyst   Patient Active Problem List   Diagnosis Date Noted  . Hepatic steatosis 05/15/2013  . Abdominal pain, right upper quadrant 05/07/2013  . Breast cancer screening 11/14/2011  . Hypertriglyceridemia 10/30/2011  . Depression 10/26/2011  . Diabetes mellitus type 2, uncontrolled ([place]) 10/26/2011  . Hypertension 10/26/2011      Prior to Admission medications   Medication Sig Start Date End Date Taking? Authorizing Provider  cefdinir (OMNICEF) 300 MG capsule Take 1 capsule (300 mg total) by mouth 2 (two) times daily. 12/19/17   Mikael Spray, NP  glimepiride (AMARYL) 1 MG tablet Take 1 mg by mouth daily with breakfast.    [provider]  HYDROcodone-acetaminophen (NORCO) 5-325 MG tablet Take 1-2  tablets by mouth every 4 (four) hours as needed for moderate pain. Patient not taking: Reported on 11/13/2017 02/25/16   Arlyss Repress, PA-C  ibuprofen (ADVIL,MOTRIN) 800 MG tablet Take 1 tablet (800 mg total) by mouth every 8 (eight) hours as needed. 02/25/16   Beers, Pierce Crane, PA-C  insulin NPH-regular Human (NOVOLIN 70/30) (70-30) 100 UNIT/ML injection Inject 10 Units into the skin 2 (two) times daily with a meal. 11/13/17   Harvest Dark, MD  Insulin Pen Needle (NOVOFINE) 32G X 6 MM MISC 1 Units by Does not apply route as needed. 10/30/11   Jackolyn Confer, MD  Insulin Syringes, Disposable, U-100 0.3 ML MISC 1 Syringe by Does not apply route 2 (two) times daily. 12/19/17   Mikael Spray, NP  lisinopril (PRINIVIL,ZESTRIL) 10 MG tablet Take 1 tablet (10 mg total) by mouth daily. 11/13/17 02/11/18  Harvest Dark, MD  metFORMIN (GLUCOPHAGE) 500 MG tablet TAKE ONE TABLET BY MOUTH TWICE DAILY WITH A MEAL 10/31/13   Jackolyn Confer, MD  Multiple Vitamins-Minerals (MULTIVITAMIN WITH MINERALS) tablet Take 1 tablet by mouth daily.    [provider]  pantoprazole (PROTONIX) 40 MG tablet Take 1 tablet (40 mg total) by mouth daily. 05/15/13   Jackolyn Confer, MD    Allergies Penicillins and Sulfa antibiotics    Social History Social History   Tobacco Use  . Smoking status: Never Smoker  . Smokeless tobacco: Never  Used  Substance Use Topics  . Alcohol use: No  . Drug use: No    Review of Systems Patient denies headaches, rhinorrhea, blurry vision, numbness, shortness of breath, chest pain, edema, cough, abdominal pain, nausea, vomiting, diarrhea, dysuria, fevers, rashes or hallucinations unless otherwise stated above in HPI. ____________________________________________   PHYSICAL EXAM:  VITAL SIGNS: Vitals:   12/21/17 1248  BP: (!) 81/54  Pulse: 95  Resp: 18  Temp: 98.2 F (36.8 C)  SpO2: 99%    Constitutional: Alert chronically ill appearing and in no  acute distress. Eyes: Conjunctivae are normal.  Head: Atraumatic. Nose: No congestion/rhinnorhea. Mouth/Throat: Mucous membranes are moist.   Neck: No stridor. Painless ROM.  Cardiovascular: Normal rate, regular rhythm. Grossly normal heart sounds.  Good peripheral circulation. Respiratory: Normal respiratory effort.  No retractions. Lungs CTAB. Gastrointestinal: Soft and nontender. No distention. No abdominal bruits. No CVA tenderness. Genitourinary: chronic indwelling foley, normal external genitalia Musculoskeletal: No lower extremity tenderness nor edema.  No joint effusions. Neurologic:  Normal speech and language. No gross focal neurologic deficits are appreciated. No facial droop Skin:  Skin is warm, dry and intact. No rash noted. Psychiatric: Mood and affect are normal. Speech and behavior are normal.  ____________________________________________   LABS (all labs ordered are listed, but only abnormal results are displayed)  Results for orders placed or performed during the hospital encounter of 12/21/17 (from the past 24 hour(s))  Comprehensive metabolic panel     Status: Abnormal   Collection Time: 12/21/17 12:53 PM  Result Value Ref Range   Sodium 133 (L) 135 - 145 mmol/L   Potassium 3.8 3.5 - 5.1 mmol/L   Chloride 101 101 - 111 mmol/L   CO2 21 (L) 22 - 32 mmol/L   Glucose, Bld 366 (H) 65 - 99 mg/dL   BUN 28 (H) 6 - 20 mg/dL   Creatinine, Ser 0.84 0.44 - 1.00 mg/dL   Calcium 10.0 8.9 - 10.3 mg/dL   Total Protein 7.6 6.5 - 8.1 g/dL   Albumin 3.6 3.5 - 5.0 g/dL   AST 35 15 - 41 U/L   ALT 81 (H) 14 - 54 U/L   Alkaline Phosphatase 474 (H) 38 - 126 U/L   Total Bilirubin 0.5 0.3 - 1.2 mg/dL   GFR calc non Af Amer >60 >60 mL/min   GFR calc Af Amer >60 >60 mL/min   Anion gap 11 5 - 15  Lactic acid, plasma     Status: Abnormal   Collection Time: 12/21/17 12:53 PM  Result Value Ref Range   Lactic Acid, Venous 2.9 (HH) 0.5 - 1.9 mmol/L  CBC with Differential     Status:  Abnormal   Collection Time: 12/21/17 12:53 PM  Result Value Ref Range   WBC 5.9 3.6 - 11.0 K/uL   RBC 4.53 3.80 - 5.20 MIL/uL   Hemoglobin 12.1 12.0 - 16.0 g/dL   HCT 34.9 (L) 35.0 - 47.0 %   MCV 77.2 (L) 80.0 - 100.0 fL   MCH 26.7 26.0 - 34.0 pg   MCHC 34.5 32.0 - 36.0 g/dL   RDW 15.7 (H) 11.5 - 14.5 %   Platelets 384 150 - 440 K/uL   Neutrophils Relative % 55 %   Neutro Abs 3.2 1.4 - 6.5 K/uL   Lymphocytes Relative 33 %   Lymphs Abs 2.0 1.0 - 3.6 K/uL   Monocytes Relative 6 %   Monocytes Absolute 0.4 0.2 - 0.9 K/uL   Eosinophils Relative 5 %  Eosinophils Absolute 0.3 0 - 0.7 K/uL   Basophils Relative 1 %   Basophils Absolute 0.0 0 - 0.1 K/uL  Protime-INR     Status: None   Collection Time: 12/21/17 12:53 PM  Result Value Ref Range   Prothrombin Time 12.9 11.4 - 15.2 seconds   INR 0.98    ____________________________________________ ____________________________________________  RADIOLOGY  I personally reviewed all radiographic images ordered to evaluate for the above acute complaints and reviewed radiology reports and findings.  These findings were personally discussed with the patient.  Please see medical record for radiology report.  ____________________________________________   PROCEDURES  Procedure(s) performed:  .Critical Care Performed by: Merlyn Lot, MD Authorized by: Merlyn Lot, MD   Critical care provider statement:    Critical care time (minutes):  15   Critical care time was exclusive of:  Separately billable procedures and treating other patients   Critical care was necessary to treat or prevent imminent or life-threatening deterioration of the following conditions:  Sepsis   Critical care was time spent personally by me on the following activities:  Development of treatment plan with patient or surrogate, discussions with consultants, evaluation of patient's response to treatment, examination of patient, obtaining history from patient or  surrogate, ordering and performing treatments and interventions, ordering and review of laboratory studies, ordering and review of radiographic studies, pulse oximetry, re-evaluation of patient's condition and review of old charts      Critical Care performed: yes ____________________________________________   INITIAL IMPRESSION / [place] / ED COURSE  Pertinent labs & imaging results that were available during my care of the patient were reviewed by me and considered in my medical decision making (see chart for details).  DDX: UTI, sepsis, dehydration, enteritis, Pyelo  Julia Shea is a 62 y.o. who presents to the ED with symptoms as described above.  Patient with evidence of sepsis secondary to UTI given chronic indwelling Foley.  Not entirely certain as to why the patient has a chronic indwelling Foley.  Will be treated for healthcare associated UTI.  Given IV fluids for lactate but fortunately she is not in septic shock.  Can receive IV resuscitation slowly during her hospitalization.  Patient will require admission the hospital for further evaluation and management of her hypotension and sepsis.  Clinical Course as of Dec 31 1355  Sat Dec 21, 2017  1540 Lymphocyte #: 2.0 [PR]    Clinical Course User Index [PR] Merlyn Lot, MD     As part of my medical decision making, I reviewed the following data within the [facility] notes reviewed and incorporated, Labs reviewed, notes from prior ED visits .  ____________________________________________   FINAL CLINICAL IMPRESSION(S) / ED DIAGNOSES  Final diagnoses:  Sepsis, due to unspecified organism [facility])  Acute cystitis with hematuria      NEW MEDICATIONS STARTED DURING THIS VISIT:  New Prescriptions   No medications on file     Note:  This document was prepared using Dragon voice recognition software and may include unintentional dictation errors.    Merlyn Lot,  MD 12/21/17 1529    Merlyn Lot, MD 12/30/17 1357

## 2017-12-21 NOTE — ED Notes (Signed)
This RN received verbal orders to change foley when gathering urine sample. This RN changed foley with Iris RN at bedside. Urine sent at this time.

## 2017-12-21 NOTE — ED Notes (Signed)
Date and time results received: 12/21/17 3:38 PM  Test: Lactic Critical Value: 2.0  Name of Provider Notified: Dr. Quentin Cornwall  Orders Received? Or Actions Taken?: Acknowledged

## 2017-12-21 NOTE — Progress Notes (Signed)
Advanced care plan.  Purpose of the Encounter: CODE STATUS  Parties in Attendance: Patient  Patient's Decision Capacity: Very good  Subjective/Patient's story: Came for abnormal labs, fever and chills.   Objective/Medical story  Has chronic indwelling foley catheter and has UTI infection and sepsis. Blood pressure elevated too.   Goals of care determination:  IV antibiotics, IV fluids Changed foley catheter Discussed advance directives, patient wants everything done such as cardiac resuscitation and intubation and ventilator if need arises.  CODE STATUS: Patient is full code   Time spent discussing advanced care planning: 16 minutes

## 2017-12-21 NOTE — ED Notes (Signed)
Code sepsis called to [place]

## 2017-12-21 NOTE — Progress Notes (Signed)
CODE SEPSIS - PHARMACY COMMUNICATION  **Broad Spectrum Antibiotics should be administered within 1 hour of Sepsis diagnosis**  Time Code Sepsis Called/Page Received: 14:06   Antibiotics Ordered: cefepime  Time of 1st antibiotic administration: 14:33  Additional action taken by pharmacy: None  If necessary, Name of Provider/Nurse Contacted: None    Laural Benes ,Pharm.D., BCPS Clinical Pharmacist  12/21/2017  2:08 PM

## 2017-12-21 NOTE — ED Triage Notes (Signed)
Pt arrived via EMS from home. Pt states she was seen at her PCP's office [facility] 2 days ago and diagnosed with UTI, but has not started treatment.  Pt also has chronic Foley catheter in place for over a month. Pt states she has needed a foley since Nov.  Pt has not been able to walk since Nov and relies on a neighbor to help her with her meds.   Pt is unsure of what labs are abnormal.

## 2017-12-21 NOTE — Progress Notes (Signed)
Pharmacy Antibiotic Note  Julia Shea is a 62 y.o. female admitted on 12/21/2017 with UTI.  Pharmacy has been consulted for cefepime dosing.  Plan: Cefepime 1 gm IV Q12H  Height: 5\' 2"  (157.5 cm) Weight: 115 lb (52.2 kg) IBW/kg (Calculated) : 50.1  Temp (24hrs), Avg:98.2 F (36.8 C), Min:98.2 F (36.8 C), Max:98.2 F (36.8 C)  Recent Labs  Lab 12/19/17 1155 12/21/17 1253 12/21/17 1432  WBC 9.1 5.9  --   CREATININE 1.06* 0.84  --   LATICACIDVEN  --  2.9* 2.0*    Estimated Creatinine Clearance: 54.2 mL/min (by C-G formula based on SCr of 0.84 mg/dL).    Allergies  Allergen Reactions  . Penicillins     Has patient had a PCN reaction causing immediate rash, facial/tongue/throat swelling, SOB or lightheadedness with hypotension: Yes Has patient had a PCN reaction causing severe rash involving mucus membranes or skin necrosis: Yes Has patient had a PCN reaction that required hospitalization: Yes Has patient had a PCN reaction occurring within the last 10 years: Yes If all of the above answers are "NO", then may proceed with Cephalosporin use.  . Sulfa Antibiotics     Antimicrobials this admission:   Dose adjustments this admission:   Microbiology results:  BCx:   UCx:    Sputum:    MRSA PCR:   Thank you for allowing pharmacy to be a part of this patient's care.  Laural Benes, Pharm.D., BCPS Clinical Pharmacist 12/21/2017 4:03 PM

## 2017-12-22 DIAGNOSIS — L8990 Pressure ulcer of unspecified site, unspecified stage: Secondary | ICD-10-CM

## 2017-12-22 LAB — BASIC METABOLIC PANEL
ANION GAP: 6 (ref 5–15)
BUN: 19 mg/dL (ref 6–20)
CO2: 24 mmol/L (ref 22–32)
Calcium: 9.5 mg/dL (ref 8.9–10.3)
Chloride: 107 mmol/L (ref 101–111)
Creatinine, Ser: 0.5 mg/dL (ref 0.44–1.00)
Glucose, Bld: 181 mg/dL — ABNORMAL HIGH (ref 65–99)
Potassium: 3.2 mmol/L — ABNORMAL LOW (ref 3.5–5.1)
SODIUM: 137 mmol/L (ref 135–145)

## 2017-12-22 LAB — CBC
HEMATOCRIT: 33.4 % — AB (ref 35.0–47.0)
HEMOGLOBIN: 11.4 g/dL — AB (ref 12.0–16.0)
MCH: 26.2 pg (ref 26.0–34.0)
MCHC: 34 g/dL (ref 32.0–36.0)
MCV: 77.1 fL — ABNORMAL LOW (ref 80.0–100.0)
Platelets: 370 10*3/uL (ref 150–440)
RBC: 4.34 MIL/uL (ref 3.80–5.20)
RDW: 15.5 % — AB (ref 11.5–14.5)
WBC: 7.8 10*3/uL (ref 3.6–11.0)

## 2017-12-22 LAB — GLUCOSE, CAPILLARY
GLUCOSE-CAPILLARY: 168 mg/dL — AB (ref 65–99)
GLUCOSE-CAPILLARY: 260 mg/dL — AB (ref 65–99)
Glucose-Capillary: 265 mg/dL — ABNORMAL HIGH (ref 65–99)
Glucose-Capillary: 192 mg/dL — ABNORMAL HIGH (ref 65–99)
Glucose-Capillary: 83 mg/dL (ref 65–99)

## 2017-12-22 LAB — MAGNESIUM: MAGNESIUM: 1.3 mg/dL — AB (ref 1.7–2.4)

## 2017-12-22 MED ORDER — POTASSIUM CHLORIDE CRYS ER 20 MEQ PO TBCR
20.0000 meq | EXTENDED_RELEASE_TABLET | Freq: Two times a day (BID) | ORAL | Status: DC
  Administered 2017-12-22 – 2017-12-23 (×3): 20 meq via ORAL
  Filled 2017-12-22 (×3): qty 1

## 2017-12-22 MED ORDER — TRAMADOL HCL 50 MG PO TABS
50.0000 mg | ORAL_TABLET | Freq: Four times a day (QID) | ORAL | Status: DC | PRN
  Administered 2017-12-22 – 2017-12-23 (×5): 50 mg via ORAL
  Filled 2017-12-22 (×5): qty 1

## 2017-12-22 NOTE — Progress Notes (Signed)
[facility] [place] NAME: Julia Shea    MR#:  [id number]  DATE OF BIRTH:  [birth year]  SUBJECTIVE:   Patient here due to suspected urinary tract infection.  Patient has a chronic indwelling Foley and presented with abnormal urinalysis and abdominal pain.  Patient presently denies any abdominal pain, no fever overnight, cultures remain negative so far.  REVIEW OF SYSTEMS:    Review of Systems  Constitutional: Positive for malaise/fatigue. Negative for chills and fever.  HENT: Negative for congestion and tinnitus.   Eyes: Negative for blurred vision and double vision.  Respiratory: Negative for cough, shortness of breath and wheezing.   Cardiovascular: Negative for chest pain, orthopnea and PND.  Gastrointestinal: Negative for abdominal pain, diarrhea, nausea and vomiting.  Genitourinary: Negative for dysuria and hematuria.  Neurological: Positive for weakness (Generalized). Negative for dizziness, sensory change and focal weakness.  All other systems reviewed and are negative.   Nutrition: Heart Healthy/Carb control Tolerating Diet: Yes Tolerating PT: Await Eval.      DRUG ALLERGIES:   Allergies  Allergen Reactions  . Penicillins     Has patient had a PCN reaction causing immediate rash, facial/tongue/throat swelling, SOB or lightheadedness with hypotension: Yes Has patient had a PCN reaction causing severe rash involving mucus membranes or skin necrosis: Yes Has patient had a PCN reaction that required hospitalization: Yes Has patient had a PCN reaction occurring within the last 10 years: Yes If all of the above answers are "NO", then may proceed with Cephalosporin use.  . Sulfa Antibiotics     VITALS:  Blood pressure (!) 151/84, pulse 96, temperature 97.6 F (36.4 C), temperature source Oral, resp. rate 20, height 5\' 2"  (1.575 m), weight 50.6 kg (111 lb 8.8 oz), SpO2 99 %.  PHYSICAL EXAMINATION:   Physical  Exam  GENERAL:  62 y.o.-year-old patient lying in bed in no acute distress.  EYES: Pupils equal, round, reactive to light and accommodation. No scleral icterus. Extraocular muscles intact.  HEENT: Head atraumatic, normocephalic. Oropharynx and nasopharynx clear.  NECK:  Supple, no jugular venous distention. No thyroid enlargement, no tenderness.  LUNGS: Normal breath sounds bilaterally, no wheezing, rales, rhonchi. No use of accessory muscles of respiration.  CARDIOVASCULAR: S1, S2 normal. No murmurs, rubs, or gallops.  ABDOMEN: Soft, nontender, nondistended. Bowel sounds present. No organomegaly or mass.  EXTREMITIES: No cyanosis, clubbing or edema b/l.    NEUROLOGIC: Cranial nerves II through XII are intact. No focal Motor or sensory deficits b/l.  Globally weak.  PSYCHIATRIC: The patient is alert and oriented x 3.  SKIN: No obvious rash, lesion, or ulcer.   + Foley cath in place with Yellow urine draining  LABORATORY PANEL:   CBC Recent Labs  Lab 12/22/17 0430  WBC 7.8  HGB 11.4*  HCT 33.4*  PLT 370   ------------------------------------------------------------------------------------------------------------------  Chemistries  Recent Labs  Lab 12/19/17 1155  12/21/17 1253 12/22/17 0430  NA 138   < > 133* 137  K 5.2  --  3.8 3.2*  CL 100  --  101 107  CO2 19*  --  21* 24  GLUCOSE 303*   < > 366* 181*  BUN 35*   < > 28* 19  CREATININE 1.06*  --  0.84 0.50  CALCIUM 10.9*  --  10.0 9.5  MG 1.7  --   --   --   AST 57*  --  35  --   ALT 135*  --  81*  --   ALKPHOS 598*  --  474*  --   BILITOT 0.4  --  0.5  --    < > = values in this interval not displayed.   ------------------------------------------------------------------------------------------------------------------  Cardiac Enzymes Recent Labs  Lab 12/21/17 1253  TROPONINI <0.03    ------------------------------------------------------------------------------------------------------------------  RADIOLOGY:  Dg Chest Portable 1 View  Result Date: 12/21/2017 CLINICAL DATA:  62 year old with sepsis.  Evaluate for pneumonia. EXAM: PORTABLE CHEST 1 VIEW COMPARISON:  None. FINDINGS: Mild elevation of the right hemidiaphragm. Lungs are clear without airspace disease or pulmonary edema. Few densities in the right lower chest are nonspecific and could be associated with a rib and other overlying structures. Heart and mediastinum are within normal limits. Trachea is midline. Negative for a pneumothorax. IMPRESSION: Few densities in the right lower chest are nonspecific. These could represent overlying structures. This area could be better characterized with PA and lateral chest views. Electronically Signed   By: Markus Daft M.D.   On: 12/21/2017 16:29     ASSESSMENT AND PLAN:   62 year old female with past medical history of hypertension, diabetes, depression, chronic indwelling Foley who presented to the hospital due to abdominal pain, abnormal urinalysis and suspected to have a UTI.  1.  Sepsis-this was the presumed diagnosis given patient's hypotension, abnormal urinalysis and elevated lactate.  The source of presumed sepsis is urinary tract infection. -Continue IV cefepime, follow urine cultures.  2.  Urinary tract infection-suspect the source of patient's sepsis.  Continue IV cefepime.  Follow cultures.  Patient has a chronic indwelling Foley but unclear why she has it.  3.  Diabetes type 2 without complication-continue, pride, sliding scale insulin.  Blood sugar stable.  4.  Essential hypertension-continue lisinopril.  5.  Hypokalemia-will place on oral potassium supplements.  Repeat level in the morning.  Check magnesium level.  Will get physical therapy evaluation.  All the records are reviewed and case discussed with Care Management/Social Worker. Management plans  discussed with the patient, family and they are in agreement.  CODE STATUS: Full code  DVT Prophylaxis: Lovenox  TOTAL TIME TAKING CARE OF THIS PATIENT: 30 minutes.   POSSIBLE D/C IN 1-2 DAYS, DEPENDING ON CLINICAL CONDITION.   Henreitta Leber M.D on 12/22/2017 at 1:49 PM  Between 7am to 6pm - Pager - [phone]  After 6pm go to [email or web address] - Proofreader  [facility]  Office  [phone]  CC: Primary care physician; Patient, No Pcp Per

## 2017-12-22 NOTE — Plan of Care (Signed)
  Problem: Urinary Elimination: Goal: Signs and symptoms of infection will decrease Outcome: Progressing  -Pt receiving scheduled IV antibiotics as ordered in [facility]; foley care performed this shift; continue to monitor pt Problem: Safety: Goal: Ability to remain free from injury will improve Outcome: Progressing  -Pt remains on High Fall Risks this shift

## 2017-12-23 LAB — URINE CULTURE: CULTURE: NO GROWTH

## 2017-12-23 LAB — GLUCOSE, CAPILLARY
GLUCOSE-CAPILLARY: 154 mg/dL — AB (ref 65–99)
Glucose-Capillary: 160 mg/dL — ABNORMAL HIGH (ref 65–99)
Glucose-Capillary: 93 mg/dL (ref 65–99)

## 2017-12-23 LAB — POTASSIUM: Potassium: 4.3 mmol/L (ref 3.5–5.1)

## 2017-12-23 LAB — HIV ANTIBODY (ROUTINE TESTING W REFLEX): HIV SCREEN 4TH GENERATION: NONREACTIVE

## 2017-12-23 MED ORDER — CLONIDINE HCL 0.1 MG PO TABS: 0.1000 mg | ORAL_TABLET | Freq: Two times a day (BID) | ORAL | 1 refills | Status: DC

## 2017-12-23 MED ORDER — CLONIDINE HCL 0.1 MG PO TABS
0.1000 mg | ORAL_TABLET | Freq: Two times a day (BID) | ORAL | Status: DC
  Administered 2017-12-23: 0.1 mg via ORAL
  Filled 2017-12-23: qty 1

## 2017-12-23 MED ORDER — LISINOPRIL 20 MG PO TABS: 20.0000 mg | ORAL_TABLET | Freq: Every day | ORAL | 1 refills | Status: DC

## 2017-12-23 MED ORDER — LISINOPRIL 10 MG PO TABS
10.0000 mg | ORAL_TABLET | Freq: Once | ORAL | Status: AC
  Administered 2017-12-23: 10:00:00 10 mg via ORAL
  Filled 2017-12-23: qty 1

## 2017-12-23 MED ORDER — LISINOPRIL 20 MG PO TABS: 20.0000 mg | ORAL_TABLET | Freq: Every day | ORAL | Status: DC

## 2017-12-23 MED ORDER — CEFUROXIME AXETIL 250 MG PO TABS: 250.0000 mg | ORAL_TABLET | Freq: Two times a day (BID) | ORAL | 0 refills | Status: AC

## 2017-12-23 NOTE — Evaluation (Signed)
Physical Therapy Evaluation Patient Details Name: Julia Shea MRN: [id number] DOB: 06/13/1956 Today's Date: 12/23/2017   History of Present Illness  Pt admitted for sepsis secondary to UTI. 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 She reports multiple falls at home.  Clinical Impression  Pt is a pleasant 62 year old female who was admitted for sepsis due to UTI. Pt with multiple medical problems and reports multiple fall history. Currently lives alone but does have some help from neighbor. Typically uses [name] for transportation to MD appointments, however reports has been getting weaker each day. Pt performs bed mobility with min assist and unable to attempt transfers/ambulation due to nausea/dizziness. Due to multiple falls, reports severe back pain, spends majority of day on couch however is ambulatory short distances in home. Pt demonstrates deficits with endurance/mobility/pain. Pt is currently not at baseline level at this time and is not safe to be home alone. Would benefit from skilled PT to address above deficits and promote optimal return to PLOF; recommend transition to STR upon discharge from acute hospitalization.       Follow Up Recommendations SNF    Equipment Recommendations  None recommended by PT    Recommendations for Other Services       Precautions / Restrictions Precautions Precautions: Fall Restrictions Weight Bearing Restrictions: No      Mobility  Bed Mobility Overal bed mobility: Needs Assistance Bed Mobility: Supine to Sit     Supine to sit: Min assist     General bed mobility comments: needs assist for B LE sliding across bed. Once seated at EOB, pt becomes dizzy and nauseated. Able to tolerate sitting at EOB for grossly 1 minute, then needing to return back to bed. RN notified. Just administered nausea meds  Transfers                 General transfer comment: unable to attempt to  nausea/dizziness  Ambulation/Gait                Stairs            Wheelchair Mobility    Modified Rankin (Stroke Patients Only)       Balance Overall balance assessment: History of Falls;Needs assistance Sitting-balance support: Feet supported;Bilateral upper extremity supported Sitting balance-Leahy Scale: Fair                                       Pertinent Vitals/Pain Pain Assessment: Faces Faces Pain Scale: Hurts whole lot Pain Location: low back across back and radiates down leg Pain Descriptors / Indicators: Moaning;Nagging Pain Intervention(s): Repositioned    Home Living Family/patient expects to be discharged to:: Private residence Living Arrangements: Alone Available Help at Discharge: Available PRN/intermittently(has neighbor that comes during the day) Type of Home: House Home Access: Stairs to enter Entrance Stairs-Rails: None Entrance Stairs-Number of Steps: 1 Home Layout: One level Home Equipment: Walker - 4 wheels      Prior Function Level of Independence: Needs assistance         Comments: Pt reports she only can walk a few steps before falling. Limited transfers at home. Reports her Left leg is weaker compared to R side     Hand Dominance        Extremity/Trunk Assessment   Upper Extremity Assessment Upper Extremity Assessment: Generalized weakness(B UE grossly 4/5)    Lower Extremity Assessment  Lower Extremity Assessment: Generalized weakness(B LE grossly 3/5 with giveaway weakness noted)       Communication      Cognition Arousal/Alertness: Awake/alert Behavior During Therapy: WFL for tasks assessed/performed Overall Cognitive Status: Within Functional Limits for tasks assessed                                        General Comments      Exercises     Assessment/Plan    PT Assessment Patient needs continued PT services  PT Problem List Decreased strength;Decreased  balance;Decreased mobility;Pain       PT Treatment Interventions Gait training;DME instruction;Therapeutic exercise;Balance training    PT Goals (Current goals can be found in the Care Plan section)  Acute Rehab PT Goals Patient Stated Goal: to get stronger PT Goal Formulation: With patient Time For Goal Achievement: 01/06/18 Potential to Achieve Goals: Good    Frequency Min 2X/week   Barriers to discharge        Co-evaluation               AM-PAC PT "6 Clicks" Daily Activity  Outcome Measure Difficulty turning over in bed (including adjusting bedclothes, sheets and blankets)?: Unable Difficulty moving from lying on back to sitting on the side of the bed? : Unable Difficulty sitting down on and standing up from a chair with arms (e.g., wheelchair, bedside commode, etc,.)?: Unable Help needed moving to and from a bed to chair (including a wheelchair)?: Total Help needed walking in hospital room?: Total Help needed climbing 3-5 steps with a railing? : Total 6 Click Score: 6    End of Session   Activity Tolerance: Patient limited by pain Patient left: in bed;with bed alarm set;with nursing/sitter in room(with MD in room) Nurse Communication: Mobility status PT Visit Diagnosis: Muscle weakness (generalized) (M62.81);Difficulty in walking, not elsewhere classified (R26.2);Pain;Repeated falls (R29.6);Unsteadiness on feet (R26.81);Dizziness and giddiness (R42) Pain - Right/Left: (low back bilat) Pain - part of body: (nack)    Time: 1610-9604 PT Time Calculation (min) (ACUTE ONLY): 16 min   Charges:   PT Evaluation $PT Eval Moderate Complexity: 1 Mod     PT G CodesGreggory Shea, PT, DPT [phone]   Julia Shea 12/23/2017, 11:33 AM

## 2017-12-23 NOTE — Progress Notes (Signed)
Pt is being discharged home. Discharge papers given and explained to pt. Pt verbalized understanding. Meds and f/u appointments reviewed. RX given. Awaiting EMS.

## 2017-12-23 NOTE — Progress Notes (Signed)
Initial Nutrition Assessment  DOCUMENTATION CODES:   Not applicable  INTERVENTION:  Brief Education on diabetes  Recommend: Ensure Enlive po BID, each supplement provides 350 kcal and 20 grams of protein   NUTRITION DIAGNOSIS:   Increased nutrient needs related to acute illness as evidenced by estimated needs.   GOAL:   Patient will meet greater than or equal to 90% of their needs   MONITOR:   PO intake, Labs, Weight trends, Supplement acceptance  REASON FOR ASSESSMENT:   Malnutrition Screening Tool    ASSESSMENT:   62 y.o. F admitted for sepsis and UTI w/ chronic Foley and stg I pressure injury to sacrum. Pt accompanying conditions include T2DM w/ hepatic steatosis, depression, hypertension, asthma. Hx of multiple falls and resulting back pain.    Pt is no longer mobile only moving short distances using her walker, but spends most of her time on the couch. Pt currently lives alone with only some assistance form a neighbor; recommendations for SNF by PT.  Pt is non-compliant w/ BG checks at home, and notes indicate that she is unsure about which labs are abnormal - presumably BG labs. Pt has malaise/fatigue and generalized weakness according to the chart as well as dizziness accompanied w/ nausea.   Pt lost 32% of body mass over the course of 6 years and a 3.5% loss of body mass over the course of two days, however, the weight on 3/21 looks to be self reported. Pt reports intentional weight loss over the course of 6 years was due to eating healthier and exercising when diagnosed with T2DM. Pt reports that she lost 3 lbs over the course of her sickness, 1-2 weeks.  Pt reports appetite is good and that she always tries to eat protein at every meal and tries to eat healthy; eating oatmeal and cutting out junk food. Pt reports that her son told her to cut out fruit and berries from her diet: identified diabetes education need.   Educated pt regarding eating CHO consistent and  MyPlate. Told her she can still have her fruit and whole grains, but that should have a consistent amount at each meal. Discussed whole grains vs refined grains.   Medications: amaryl, novolog 0-15 units & 0-5 units & 4 units, metformin, MVI, protonix, potassium chloride.   Labs: BG 181 (H), mg 1.3 (L), hemoglobin 11.4 (L), HCT 33.4 (L)  I/Os: +3,103.9 since admit, Urine output 550 ml/kg/hr (3/24 - 3/25)   NUTRITION - FOCUSED PHYSICAL EXAM:    Most Recent Value  Orbital Region  No depletion  Upper Arm Region  Moderate depletion  Thoracic and Lumbar Region  Unable to assess  Buccal Region  No depletion  Temple Region  No depletion  Clavicle Bone Region  No depletion  Clavicle and Acromion Bone Region  Mild depletion  Scapular Bone Region  Unable to assess  Dorsal Hand  Mild depletion  Patellar Region  Unable to assess  Anterior Thigh Region  Unable to assess  Posterior Calf Region  Unable to assess  Edema (RD Assessment)  Unable to assess  Hair  Reviewed  Eyes  Reviewed  Mouth  Reviewed  Skin  Reviewed  Nails  Reviewed       Diet Order:  Diet heart healthy/carb modified Room service appropriate? Yes; Fluid consistency: Thin Diet - low sodium heart healthy Diet Carb Modified  EDUCATION NEEDS:   Education needs have been addressed  Skin:  Skin Assessment: Skin Integrity Issues: Skin Integrity Issues:: Stage I  Stage I: Stg I pressure injury to sacrum  Last BM:  12/23/17  Height:   Ht Readings from Last 1 Encounters:  12/21/17 5\' 2"  (1.575 m)    Weight:   Wt Readings from Last 1 Encounters:  12/21/17 111 lb 8.8 oz (50.6 kg)    Ideal Body Weight:  50 kg  BMI:  Body mass index is 20.4 kg/m.  Estimated Nutritional Needs:   Kcal:  1250-1450 kcal  Protein:  65-70 grams  Fluid:  > 1.2 L    Hope Budds, Dietetic Intern

## 2017-12-23 NOTE — Discharge Summary (Signed)
[facility] [place] NAME: Julia Shea    MR#:  [id number]  DATE OF BIRTH:  06/29/1956  DATE OF ADMISSION:  12/21/2017 ADMITTING PHYSICIAN: Julia Shelling, MD  DATE OF DISCHARGE: 12/23/2017  PRIMARY CARE PHYSICIAN: Julia Shea    ADMISSION DIAGNOSIS:  Acute cystitis with hematuria [N30.01] Sepsis, due to unspecified organism ([place]) [A41.9]  DISCHARGE DIAGNOSIS:  Active Problems:   Sepsis (Julia Shea)   Pressure injury of skin   SECONDARY DIAGNOSIS:   Past Medical History:  Diagnosis Date  . Asthma   . 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 Chronic indwelling Foley catheter   . Depression   . Diabetes mellitus   . Hypertension   . MRSA infection     HOSPITAL COURSE:   62 year old female with past medical history of hypertension, diabetes, depression, chronic indwelling Foley who presented to the hospital due to abdominal pain, abnormal urinalysis and suspected to have a UTI.  1.  Sepsis-this was the presumed diagnosis given patient's hypotension, abnormal urinalysis and elevated lactate.  The source of presumed sepsis is urinary tract infection. -Patient was empirically treated with IV cefepime.  She has improved.  She has been afebrile and hemodynamically stable over the past 48 hours.  Her urine cultures remain negative.  She is being empirically discharged on oral Ceftin for additional 5 days..  2.  Urinary tract infection-suspect the source of patient's sepsis.  Patient was treated with IV cefepime while in the hospital and has clinically improved.  She is now being discharged on oral Ceftin for additional 5 days.  Patient has a chronic indwelling Foley but it is unclear why she has it.  She will continue follow-up with a urologist.  Her Foley catheter has been changed out while this hospitalization.  3.  Diabetes type 2 without complication- while in the  hospital patient was sliding scale insulin.  Patient will resume her NPH insulin, glimepiride upon discharge.  She will also resume her metformin.  4.  Essential hypertension- patient's blood pressure was somewhat uncontrolled.  Her lisinopril dose was increased from 10-20 mg.  She was also started on low-dose clonidine which she is being discharged on.  5.  Hypokalemia- this has improved and resolved with supplementation.  6.  GERD-she will continue her Protonix.  Patient is debilitated and quite weak and therefore physical therapy consult was obtained who recommended short-term rehab.  Patient has no insurance and therefore cannot go to rehab facility.  We will discharge her home with home health services.  DISCHARGE CONDITIONS:   Stable  CONSULTS OBTAINED:    DRUG ALLERGIES:   Allergies  Allergen Reactions  . Penicillins     Has patient had a PCN reaction causing immediate rash, facial/tongue/throat swelling, SOB or lightheadedness with hypotension: Yes Has patient had a PCN reaction causing severe rash involving mucus membranes or skin necrosis: Yes Has patient had a PCN reaction that required hospitalization: Yes Has patient had a PCN reaction occurring within the last 10 years: Yes If all of the above answers are "NO", then may proceed with Cephalosporin use.  . Sulfa Antibiotics     DISCHARGE MEDICATIONS:   Allergies as of 12/23/2017      Reactions   Penicillins    Has patient had a PCN reaction causing immediate rash, facial/tongue/throat swelling, SOB or lightheadedness with hypotension: Yes Has patient had a PCN reaction  causing severe rash involving mucus membranes or skin necrosis: Yes Has patient had a PCN reaction that required hospitalization: Yes Has patient had a PCN reaction occurring within the last 10 years: Yes If all of the above answers are "NO", then may proceed with Cephalosporin use.   Sulfa Antibiotics       Medication List    STOP taking these  medications   cefdinir 300 MG capsule Commonly known as:  OMNICEF   HYDROcodone-acetaminophen 5-325 MG tablet Commonly known as:  NORCO   ibuprofen 800 MG tablet Commonly known as:  ADVIL,MOTRIN     TAKE these medications   cefUROXime 250 MG tablet Commonly known as:  CEFTIN Take 1 tablet (250 mg total) by mouth 2 (two) times daily with a meal for 5 days.   cloNIDine 0.1 MG tablet Commonly known as:  CATAPRES Take 1 tablet (0.1 mg total) by mouth 2 (two) times daily.   glimepiride 1 MG tablet Commonly known as:  AMARYL Take 1 mg by mouth daily with breakfast.   insulin NPH-regular Human (70-30) 100 UNIT/ML injection Commonly known as:  NOVOLIN 70/30 Inject 10 Units into the skin 2 (two) times daily with a meal.   Insulin Pen Needle 32G X 6 MM Misc Commonly known as:  NOVOFINE 1 Units by Does not apply route as needed.   Insulin Syringes (Disposable) U-100 0.3 ML Misc 1 Syringe by Does not apply route 2 (two) times daily.   lisinopril 20 MG tablet Commonly known as:  PRINIVIL,ZESTRIL Take 1 tablet (20 mg total) by mouth daily. What changed:    medication strength  how much to take   metFORMIN 500 MG tablet Commonly known as:  GLUCOPHAGE Take 500 mg by mouth 2 (two) times daily.   multivitamin with minerals tablet Take 1 tablet by mouth daily.   pantoprazole 40 MG tablet Commonly known as:  PROTONIX Take 1 tablet (40 mg total) by mouth daily.         DISCHARGE INSTRUCTIONS:   DIET:  Cardiac diet and Diabetic diet  DISCHARGE CONDITION:  Stable  ACTIVITY:  Activity as tolerated  OXYGEN:  Home Oxygen: No.   Oxygen Delivery: room air  DISCHARGE LOCATION:  Home with home health physical therapy, occupational therapy, nursing, social work and home health aide.  If you experience worsening of your admission symptoms, develop shortness of breath, life threatening emergency, suicidal or homicidal thoughts you must seek medical attention immediately  by calling 911 or calling your MD immediately  if symptoms less severe.  You Must read complete instructions/literature along with all the possible adverse reactions/side effects for all the Medicines you take and that have been prescribed to you. Take any new Medicines after you have completely understood and accpet all the possible adverse reactions/side effects.   Please note  You were cared for by a hospitalist during your hospital stay. If you have any questions about your discharge medications or the care you received while you were in the hospital after you are discharged, you can call the unit and asked to speak with the hospitalist on call if the hospitalist that took care of you is not available. Once you are discharged, your primary care physician will handle any further medical issues. Please note that NO REFILLS for any discharge medications will be authorized once you are discharged, as it is imperative that you return to your primary care physician (or establish a relationship with a primary care physician if you do not have  one) for your aftercare needs so that they can reassess your need for medications and monitor your lab values.     Today   Patient's blood pressure is slightly elevated but otherwise patient is asymptomatic.  No fevers overnight, urine cultures remain negative.  Discharge her home today with home health services.  VITAL SIGNS:  Blood pressure (!) 182/72, pulse 85, temperature 97.7 F (36.5 C), temperature source Oral, resp. rate 18, height 5\' 2"  (1.575 m), weight 50.6 kg (111 lb 8.8 oz), SpO2 100 %.  I/O:    Intake/Output Summary (Last 24 hours) at 12/23/2017 1442 Last data filed at 12/23/2017 0346 Gross Shea 24 hour  Intake 1594.8 ml  Output 550 ml  Net 1044.8 ml    PHYSICAL EXAMINATION:   GENERAL:  62 y.o.-year-old patient lying in bed in no acute distress.  EYES: Pupils equal, round, reactive to light and accommodation. No scleral icterus.  Extraocular muscles intact.  HEENT: Head atraumatic, normocephalic. Oropharynx and nasopharynx clear.  NECK:  Supple, no jugular venous distention. No thyroid enlargement, no tenderness.  LUNGS: Normal breath sounds bilaterally, no wheezing, rales, rhonchi. No use of accessory muscles of respiration.  CARDIOVASCULAR: S1, S2 normal. No murmurs, rubs, or gallops.  ABDOMEN: Soft, nontender, nondistended. Bowel sounds present. No organomegaly or mass.  EXTREMITIES: No cyanosis, clubbing or edema b/l.    NEUROLOGIC: Cranial nerves II through XII are intact. No focal Motor or sensory deficits b/l.  Globally weak.  PSYCHIATRIC: The patient is alert and oriented x 3.  SKIN: No obvious rash, lesion, or ulcer.   + Foley cath in place with Yellow urine draining    DATA REVIEW:   CBC Recent Labs  Lab 12/22/17 0430  WBC 7.8  HGB 11.4*  HCT 33.4*  PLT 370    Chemistries  Recent Labs  Lab 12/21/17 1253 12/22/17 0430 12/23/17 0321  NA 133* 137  --   K 3.8 3.2* 4.3  CL 101 107  --   CO2 21* 24  --   GLUCOSE 366* 181*  --   BUN 28* 19  --   CREATININE 0.84 0.50  --   CALCIUM 10.0 9.5  --   MG  --  1.3*  --   AST 35  --   --   ALT 81*  --   --   ALKPHOS 474*  --   --   BILITOT 0.5  --   --     Cardiac Enzymes Recent Labs  Lab 12/21/17 1253  TROPONINI <0.03    Microbiology Results  Results for orders placed or performed during the hospital encounter of 12/21/17  Culture, blood (Routine x 2)     Status: None (Preliminary result)   Collection Time: 12/21/17 12:50 PM  Result Value Ref Range Status   Specimen Description BLOOD Blood Culture adequate volume  Final   Special Requests   Final    BOTTLES DRAWN AEROBIC AND ANAEROBIC BLOOD LEFT FOREARM SUPERIOR   Culture   Final    NO GROWTH 2 DAYS Performed at [facility], [address] [place], [place] [address]    Report Status PENDING  Incomplete  Culture, blood (Routine x 2)     Status: None (Preliminary  result)   Collection Time: 12/21/17 12:55 PM  Result Value Ref Range Status   Specimen Description   Final    BLOOD Blood Culture results may not be optimal due to an excessive volume of blood received in culture bottles  Special Requests   Final    BOTTLES DRAWN AEROBIC AND ANAEROBIC BLOOD LEFT FOREARM LATTERAL   Culture   Final    NO GROWTH 2 DAYS Performed at [facility], [address] [place], [place] [address]    Report Status PENDING  Incomplete  Urine culture     Status: None   Collection Time: 12/21/17  2:31 PM  Result Value Ref Range Status   Specimen Description   Final    URINE, RANDOM Performed at [facility], [address], [place], [place] [address]    Special Requests   Final    NONE Performed at [facility], [address] [place], [place] [address]    Culture   Final    NO GROWTH Performed at [facility], [place] [address], [place], [place] [address]    Report Status 12/23/2017 FINAL  Final    RADIOLOGY:  Dg Chest Portable 1 View  Result Date: 12/21/2017 CLINICAL DATA:  62 year old with sepsis.  Evaluate for pneumonia. EXAM: PORTABLE CHEST 1 VIEW COMPARISON:  None. FINDINGS: Mild elevation of the right hemidiaphragm. Lungs are clear without airspace disease or pulmonary edema. Few densities in the right lower chest are nonspecific and could be associated with a rib and other overlying structures. Heart and mediastinum are within normal limits. Trachea is midline. Negative for a pneumothorax. IMPRESSION: Few densities in the right lower chest are nonspecific. These could represent overlying structures. This area could be better characterized with PA and lateral chest views. Electronically Signed   By: Markus Daft M.D.   On: 12/21/2017 16:29      Management plans discussed with the patient, family and they are in agreement.  CODE STATUS:     Code Status Orders  (From admission, onward)        Start     Ordered    12/21/17 1750  Full code  Continuous     12/21/17 1749    Code Status History    This patient has a current code status but no historical code status.      TOTAL TIME TAKING CARE OF THIS PATIENT: 40 minutes.    Henreitta Leber M.D on 12/23/2017 at 2:42 PM  Between 7am to 6pm - Pager - [phone]  After 6pm go to [email or web address] - Proofreader  [facility]  Office  [phone]  CC: Primary care physician; Julia Shea

## 2017-12-23 NOTE — Clinical Social Work Note (Signed)
Clinical Social Work Assessment  Patient Details  Name: Julia Shea MRN: [id number] Date of Birth: [birth year]  Date of referral:  12/23/17               Reason for consult:  [employer]                Permission sought to share information with:  Case Optician, dispensing granted to share information::  Yes, Verbal Permission Granted  Name::        Agency::     Relationship::     Contact Information:     Housing/Transportation Living arrangements for the past 2 months:  Single Family Home Source of Information:  Patient Patient Interpreter Needed:  None Criminal Activity/Legal Involvement Pertinent to Current Situation/Hospitalization:  No - Comment as needed Significant Relationships:  Friend Lives with:  Self Do you feel safe going back to the place where you live?  Yes Need for family participation in patient care:  Yes (Comment)  Care giving concerns:  Patient lives alone in [place] however her friend Julia Shea stays with her often.    Social Worker assessment / plan:  Holiday representative (CSW) reviewed chart and noted that PT is recommending SNF. Patient has no insurance and no payer for SNF. CSW met with patient alone at bedside to discuss D/C plan. Patient was alert and oriented X4 and was laying in the bed. Patient reported that she lives alone in [place] and her friend stays with her most of the time. Patient reported that she has not been very mobile since November 2018 and needs assistance getting up to the walker. Patient reported that she has no health insurance. Patient reported that she does get a retirement check but she is not on disability. Patient reported that she sees the social work at [facility]. Patient reported that the social worker at [facility] is helping her apply for medicaid. CSW explained SNF placement. Patient reported that she can't pay for SNF and does not want to go to SNF. Patient reported that she will go home today via EMS. RN,  RN case manager and MD aware of above. Please reconsult if future social work needs arise. CSW signing off.   Employment status:  Unemployed Forensic scientist:  Self Pay (Medicaid Pending) PT Recommendations:  [place] / Referral to community resources:  Other (Comment Required)(Patient goes to [facility]. )  Patient/Family's Response to care:  Patient is agreeable to D/C home.   Patient/Family's Understanding of and Emotional Response to Diagnosis, Current Treatment, and Prognosis:  Patient was pleasant and thanked CSW for visit.   Emotional Assessment Appearance:  Appears stated age Attitude/Demeanor/Rapport:    Affect (typically observed):  Accepting, Adaptable, Pleasant Orientation:  Oriented to Self, Oriented to Place, Oriented to  Time, Oriented to Situation Alcohol / Substance use:  Not Applicable Psych involvement (Current and /or in the community):  No (Comment)  Discharge Needs  Concerns to be addressed:  No discharge needs identified Readmission within the last 30 days:  No Current discharge risk:  Dependent with Mobility Barriers to Discharge:  No Barriers Identified   Derik Fults, Veronia Beets, LCSW 12/23/2017, 5:30 PM

## 2017-12-23 NOTE — Care Management Note (Signed)
Case Management Note  Patient Details  Name: Julia Shea MRN: [id number] Date of Birth: [birth year]  Subjective/Objective:    Admitted to [facility] with he diagnosis of sepsis. Lives alone, but friend Sydnee Cabal is in her home most all of the time. [facility] appointment was last 12/19/17. Next appointment is 01/08/18. Prescriptions are [facility]. Hoping to start getting medications at the [facility].  No skilled nursing. No home Health. Uses a rollayor to get around              Action/Plan: Physical therapy evaluation completed. Recommending skilled nursing. No insurance. Will contact Floydene Flock at [facility] for charity care. Requested rescue unit for transportation   Expected Discharge Date:                  Expected Discharge Plan:     In-House Referral:     Discharge planning Services     Post Acute Care Choice:    Choice offered to:     DME Arranged:    DME Agency:     HH Arranged:    HH Agency:     Status of Service:     If discussed at [name], dates discussed:    Additional Comments:  Shelbie Ammons, RN MSN CCM Care Management [phone] 12/23/2017, 2:38 PM

## 2017-12-26 ENCOUNTER — Ambulatory Visit: Payer: Self-pay | Admitting: Ophthalmology

## 2017-12-26 LAB — CULTURE, BLOOD (ROUTINE X 2)
Culture: NO GROWTH
Culture: NO GROWTH
Specimen Description: ADEQUATE

## 2017-12-30 ENCOUNTER — Telehealth: Payer: Self-pay | Admitting: Internal Medicine

## 2017-12-30 NOTE — Telephone Encounter (Signed)
LMOM for Julia Shea to call back. We have never seen this pt in clinic.

## 2017-12-30 NOTE — Telephone Encounter (Signed)
[facility] needing a verbal for home health.  Nursing services  She is asking for  1w 1 a week  2w 1 a week 1w 2 x a week  1w every other week for about 5w   Please advise

## 2017-12-30 NOTE — Telephone Encounter (Signed)
PT calling to request orders for 3x week for 1 week  2x week for 3 weeks  1 x week for 2 weeks  Also reporting patient had a fall yesterday denies injuries

## 2017-12-31 ENCOUNTER — Telehealth: Payer: Self-pay | Admitting: Licensed Clinical Social Worker

## 2017-12-31 ENCOUNTER — Telehealth: Payer: Self-pay | Admitting: Adult Health Nurse Practitioner

## 2017-12-31 NOTE — Telephone Encounter (Signed)
Spoke with Raquel Sarna and she states pt was seen at [facility] by [facility]. Informed her that [facility] has nothing to do with the [facility] and she will need to contact them for PT orders. Raquel Sarna verbalized understanding. Nothing further needed.

## 2017-12-31 NOTE — Telephone Encounter (Signed)
Physical therapist called asking if a Dr here would be able to sign an order for patient's physical therapist   Also had some concerns about patient current condition that she would like to speak to provider about.

## 2017-12-31 NOTE — Telephone Encounter (Signed)
EMMI flagged patient for having issues with transportation and feeling sad/hopeless/anxious/empty. Clinical Education officer, museum (CSW) was able to reach patient via telephone today. Patient reported that her pain and her mood are up and down. Patient reported that she is feeling depressed because of all her health problems. Patient reported that a home health social worker is coming to see her today and she started home health PT yesterday. Patient reported that sees [facility] social worker. Patient reported that she is not having thoughts of hurting herself. CSW provided patient with information about [facility] in [place]. Patient is familiar with [facility] and will follow up with there. Patient reported that she uses [name] for transportation however she needs help getting up and outside. Patient reported that her friend [name] can help her but it takes 2 people to assist. CSW explained that patient can use [facility] for transport to doctors appointments if needed. Patient reported that is too expensive and she will get her friend to assist her. No future call is needed.   [employer], LCSW [phone]

## 2017-12-31 NOTE — Telephone Encounter (Signed)
Please see the attached note. The Physical Therapist name and number is attached.

## 2018-01-01 NOTE — Telephone Encounter (Signed)
Please proceed with above mentioned

## 2018-01-01 NOTE — Telephone Encounter (Signed)
Referred to pcp. Nothing further needed.

## 2018-01-02 ENCOUNTER — Ambulatory Visit: Payer: Self-pay | Admitting: Ophthalmology

## 2018-01-02 ENCOUNTER — Ambulatory Visit: Payer: Self-pay | Admitting: Adult Health

## 2018-01-08 ENCOUNTER — Ambulatory Visit: Payer: Self-pay | Admitting: Specialist

## 2018-01-08 ENCOUNTER — Ambulatory Visit: Payer: Self-pay | Admitting: Licensed Clinical Social Worker

## 2018-01-09 ENCOUNTER — Ambulatory Visit: Payer: Self-pay | Admitting: Adult Health

## 2018-01-09 ENCOUNTER — Ambulatory Visit: Payer: Self-pay | Admitting: Licensed Clinical Social Worker

## 2018-01-09 ENCOUNTER — Ambulatory Visit: Payer: Self-pay | Admitting: Ophthalmology

## 2018-01-22 ENCOUNTER — Ambulatory Visit: Payer: Self-pay | Admitting: Specialist

## 2018-01-23 ENCOUNTER — Emergency Department: Payer: Medicaid Other

## 2018-01-23 ENCOUNTER — Inpatient Hospital Stay
Admission: EM | Admit: 2018-01-23 | Discharge: 2018-01-28 | DRG: 690 | Disposition: A | Discharge status: Home Health | Payer: Medicaid Other | Attending: Internal Medicine | Admitting: Internal Medicine

## 2018-01-23 ENCOUNTER — Other Ambulatory Visit: Payer: Self-pay

## 2018-01-23 ENCOUNTER — Ambulatory Visit: Payer: Self-pay | Admitting: Adult Health

## 2018-01-23 ENCOUNTER — Encounter: Payer: Self-pay | Admitting: Emergency Medicine

## 2018-01-23 ENCOUNTER — Ambulatory Visit: Payer: Self-pay | Admitting: Ophthalmology

## 2018-01-23 ENCOUNTER — Ambulatory Visit: Payer: Self-pay | Admitting: Licensed Clinical Social Worker

## 2018-01-23 DIAGNOSIS — Z9181 History of falling: Secondary | ICD-10-CM | POA: Diagnosis not present

## 2018-01-23 DIAGNOSIS — E440 Moderate protein-calorie malnutrition: Secondary | ICD-10-CM | POA: Diagnosis present

## 2018-01-23 DIAGNOSIS — K5900 Constipation, unspecified: Secondary | ICD-10-CM | POA: Diagnosis present

## 2018-01-23 DIAGNOSIS — E11649 Type 2 diabetes mellitus with hypoglycemia without coma: Secondary | ICD-10-CM | POA: Diagnosis present

## 2018-01-23 DIAGNOSIS — I10 Essential (primary) hypertension: Secondary | ICD-10-CM | POA: Diagnosis present

## 2018-01-23 DIAGNOSIS — Z882 Allergy status to sulfonamides status: Secondary | ICD-10-CM | POA: Diagnosis not present

## 2018-01-23 DIAGNOSIS — Z794 Long term (current) use of insulin: Secondary | ICD-10-CM

## 2018-01-23 DIAGNOSIS — E860 Dehydration: Secondary | ICD-10-CM | POA: Diagnosis present

## 2018-01-23 DIAGNOSIS — J45909 Unspecified asthma, uncomplicated: Secondary | ICD-10-CM | POA: Diagnosis present

## 2018-01-23 DIAGNOSIS — Z880 Allergy status to penicillin: Secondary | ICD-10-CM | POA: Diagnosis not present

## 2018-01-23 DIAGNOSIS — N390 Urinary tract infection, site not specified: Secondary | ICD-10-CM

## 2018-01-23 DIAGNOSIS — Z833 Family history of diabetes mellitus: Secondary | ICD-10-CM | POA: Diagnosis not present

## 2018-01-23 DIAGNOSIS — Z79899 Other long term (current) drug therapy: Secondary | ICD-10-CM

## 2018-01-23 DIAGNOSIS — Z789 Other specified health status: Secondary | ICD-10-CM

## 2018-01-23 DIAGNOSIS — R531 Weakness: Secondary | ICD-10-CM | POA: Diagnosis not present

## 2018-01-23 DIAGNOSIS — I959 Hypotension, unspecified: Secondary | ICD-10-CM | POA: Diagnosis present

## 2018-01-23 DIAGNOSIS — Z8614 Personal history of Methicillin resistant Staphylococcus aureus infection: Secondary | ICD-10-CM | POA: Diagnosis not present

## 2018-01-23 DIAGNOSIS — N3090 Cystitis, unspecified without hematuria: Secondary | ICD-10-CM | POA: Diagnosis not present

## 2018-01-23 DIAGNOSIS — Z681 Body mass index (BMI) 19 or less, adult: Secondary | ICD-10-CM

## 2018-01-23 DIAGNOSIS — A419 Sepsis, unspecified organism: Secondary | ICD-10-CM

## 2018-01-23 LAB — LACTIC ACID, PLASMA
LACTIC ACID, VENOUS: 2.5 mmol/L — AB (ref 0.5–1.9)
LACTIC ACID, VENOUS: 1.1 mmol/L (ref 0.5–1.9)
LACTIC ACID, VENOUS: 1.7 mmol/L (ref 0.5–1.9)

## 2018-01-23 LAB — GLUCOSE, CAPILLARY: Glucose-Capillary: 107 mg/dL — ABNORMAL HIGH (ref 65–99)

## 2018-01-23 LAB — URINALYSIS, COMPLETE (UACMP) WITH MICROSCOPIC
BILIRUBIN URINE: NEGATIVE
Bacteria, UA: NONE SEEN
Glucose, UA: NEGATIVE mg/dL
Ketones, ur: NEGATIVE mg/dL
Nitrite: NEGATIVE
Protein, ur: 100 mg/dL — AB
RBC / HPF: >50 RBC/hpf — ABNORMAL HIGH (ref 0–5)
SPECIFIC GRAVITY, URINE: 1.02 (ref 1.005–1.030)
WBC, UA: >50 WBC/hpf — ABNORMAL HIGH (ref 0–5)
pH: 5 (ref 5.0–8.0)

## 2018-01-23 LAB — BASIC METABOLIC PANEL
Anion gap: 11 (ref 5–15)
BUN: 26 mg/dL — ABNORMAL HIGH (ref 6–20)
CALCIUM: 10.5 mg/dL — AB (ref 8.9–10.3)
CO2: 25 mmol/L (ref 22–32)
CREATININE: 0.73 mg/dL (ref 0.44–1.00)
Chloride: 100 mmol/L — ABNORMAL LOW (ref 101–111)
GFR calc Af Amer: >60 mL/min (ref >60)
GFR calc non Af Amer: >60 mL/min (ref >60)
Glucose, Bld: 135 mg/dL — ABNORMAL HIGH (ref 65–99)
Potassium: 3.7 mmol/L (ref 3.5–5.1)
Sodium: 136 mmol/L (ref 135–145)

## 2018-01-23 LAB — CBC
HCT: 36.1 % (ref 35.0–47.0)
Hemoglobin: 12.6 g/dL (ref 12.0–16.0)
MCH: 27.5 pg (ref 26.0–34.0)
MCHC: 34.9 g/dL (ref 32.0–36.0)
MCV: 78.8 fL — ABNORMAL LOW (ref 80.0–100.0)
PLATELETS: 399 10*3/uL (ref 150–440)
RBC: 4.59 MIL/uL (ref 3.80–5.20)
RDW: 17.4 % — AB (ref 11.5–14.5)
WBC: 13.1 10*3/uL — ABNORMAL HIGH (ref 3.6–11.0)

## 2018-01-23 MED ORDER — ONDANSETRON HCL 4 MG PO TABS
4.0000 mg | ORAL_TABLET | Freq: Four times a day (QID) | ORAL | Status: DC | PRN
Start: 2018-01-23 — End: 2018-01-28

## 2018-01-23 MED ORDER — SENNOSIDES-DOCUSATE SODIUM 8.6-50 MG PO TABS: 1.0000 | ORAL_TABLET | Freq: Every evening | ORAL | Status: DC | PRN

## 2018-01-23 MED ORDER — SODIUM CHLORIDE 0.9 % IV SOLN
2.0000 g | Freq: Once | INTRAVENOUS | Status: AC
  Administered 2018-01-23: 2 g via INTRAVENOUS
  Filled 2018-01-23: qty 2

## 2018-01-23 MED ORDER — INSULIN ASPART 100 UNIT/ML ~~LOC~~ SOLN: 0.0000 [IU] | Freq: Every day | SUBCUTANEOUS | Status: DC

## 2018-01-23 MED ORDER — ONDANSETRON HCL 4 MG/2ML IJ SOLN
4.0000 mg | Freq: Once | INTRAMUSCULAR | Status: AC
  Administered 2018-01-23: 4 mg via INTRAVENOUS
  Filled 2018-01-23: qty 2

## 2018-01-23 MED ORDER — GLIMEPIRIDE 1 MG PO TABS
1.0000 mg | ORAL_TABLET | Freq: Every day | ORAL | Status: DC
  Administered 2018-01-25 – 2018-01-27 (×3): 1 mg via ORAL
  Filled 2018-01-23 (×4): qty 1

## 2018-01-23 MED ORDER — ACETAMINOPHEN 325 MG PO TABS
650.0000 mg | ORAL_TABLET | Freq: Four times a day (QID) | ORAL | Status: DC | PRN
  Administered 2018-01-23 – 2018-01-28 (×7): 650 mg via ORAL
  Filled 2018-01-23 (×7): qty 2

## 2018-01-23 MED ORDER — ACETAMINOPHEN 650 MG RE SUPP: 650.0000 mg | Freq: Four times a day (QID) | RECTAL | Status: DC | PRN

## 2018-01-23 MED ORDER — ADULT MULTIVITAMIN W/MINERALS CH
1.0000 | ORAL_TABLET | Freq: Every day | ORAL | Status: DC
  Administered 2018-01-23 – 2018-01-28 (×6): 1 via ORAL
  Filled 2018-01-23 (×6): qty 1

## 2018-01-23 MED ORDER — ENOXAPARIN SODIUM 40 MG/0.4ML ~~LOC~~ SOLN
40.0000 mg | SUBCUTANEOUS | Status: DC
  Administered 2018-01-23 – 2018-01-28 (×5): 40 mg via SUBCUTANEOUS
  Filled 2018-01-23 (×5): qty 0.4

## 2018-01-23 MED ORDER — SODIUM CHLORIDE 0.9 % IV SOLN
1.0000 g | Freq: Two times a day (BID) | INTRAVENOUS | Status: DC
Start: 2018-01-24 — End: 2018-01-26
  Administered 2018-01-24 – 2018-01-26 (×5): 1 g via INTRAVENOUS
  Filled 2018-01-23 (×6): qty 1

## 2018-01-23 MED ORDER — SODIUM CHLORIDE 0.9 % IV BOLUS
1000.0000 mL | Freq: Once | INTRAVENOUS | Status: AC
  Administered 2018-01-23: 1000 mL via INTRAVENOUS

## 2018-01-23 MED ORDER — INSULIN ASPART 100 UNIT/ML ~~LOC~~ SOLN
0.0000 [IU] | Freq: Three times a day (TID) | SUBCUTANEOUS | Status: DC
  Administered 2018-01-24: 3 [IU] via SUBCUTANEOUS
  Filled 2018-01-23: qty 1

## 2018-01-23 MED ORDER — LISINOPRIL 20 MG PO TABS
20.0000 mg | ORAL_TABLET | Freq: Every day | ORAL | Status: DC
  Administered 2018-01-23 – 2018-01-28 (×6): 20 mg via ORAL
  Filled 2018-01-23 (×6): qty 1

## 2018-01-23 MED ORDER — CLONIDINE HCL 0.1 MG PO TABS
0.1000 mg | ORAL_TABLET | Freq: Two times a day (BID) | ORAL | Status: DC
  Administered 2018-01-23 – 2018-01-28 (×10): 0.1 mg via ORAL
  Filled 2018-01-23 (×10): qty 1

## 2018-01-23 MED ORDER — SODIUM CHLORIDE 0.9 % IV BOLUS
1000.0000 mL | Freq: Once | INTRAVENOUS | Status: AC
Start: 2018-01-23 — End: 2018-01-23
  Administered 2018-01-23: 1000 mL via INTRAVENOUS

## 2018-01-23 MED ORDER — METFORMIN HCL 500 MG PO TABS
500.0000 mg | ORAL_TABLET | Freq: Two times a day (BID) | ORAL | Status: DC
  Administered 2018-01-25 – 2018-01-28 (×7): 500 mg via ORAL
  Filled 2018-01-23 (×8): qty 1

## 2018-01-23 MED ORDER — PANTOPRAZOLE SODIUM 40 MG PO TBEC
40.0000 mg | DELAYED_RELEASE_TABLET | Freq: Every day | ORAL | Status: DC
  Administered 2018-01-23 – 2018-01-28 (×6): 40 mg via ORAL
  Filled 2018-01-23 (×6): qty 1

## 2018-01-23 MED ORDER — ACETAMINOPHEN 325 MG PO TABS
650.0000 mg | ORAL_TABLET | Freq: Once | ORAL | Status: AC
  Administered 2018-01-23: 650 mg via ORAL
  Filled 2018-01-23: qty 2

## 2018-01-23 MED ORDER — SODIUM CHLORIDE 0.9 % IV SOLN
INTRAVENOUS | Status: DC
  Administered 2018-01-23 – 2018-01-25 (×3): via INTRAVENOUS

## 2018-01-23 MED ORDER — ONDANSETRON HCL 4 MG/2ML IJ SOLN
4.0000 mg | Freq: Four times a day (QID) | INTRAMUSCULAR | Status: DC | PRN
  Administered 2018-01-23 – 2018-01-28 (×8): 4 mg via INTRAVENOUS
  Filled 2018-01-23 (×9): qty 2

## 2018-01-23 NOTE — ED Triage Notes (Signed)
Pt comes into the ED via EMS from home c/o weakness, nausea, and needing her catheter changed.  Patient states she comes every 30 days to have her catheter changed.  Patient states she has been waiting for home health to be established.  She has been nauseas and feeling weak all weak with no relief.  Patient is neurologically intact at this time.  Patient states she will eat well one day and then the next day she cant eat anything. Patient hypotensive at this time.

## 2018-01-23 NOTE — ED Notes (Signed)
First Nurse Note:  Per EMS patient called them to take her to the hospital for a catheter change.  Patient told EMS staff that she has come to the ED once per month for a catheter change for several months.  EMS state patient was unable to stand or walk for them and that patient's home was very dirty with trash and 6 cats walking around small area.  Patient states that she is supposed to have PT and home health but that, "communication is not good."  Patient was sitting on couch when EMS arrived to home.  Patient told EMS that she had a friend that helps her and she called EMS to help her.  EMS cut off hospital bracelet from 3/23.

## 2018-01-23 NOTE — Progress Notes (Signed)
CODE SEPSIS - PHARMACY COMMUNICATION  **Broad Spectrum Antibiotics should be administered within 1 hour of Sepsis diagnosis**  Time Code Sepsis Called/Page Received: 1900  Antibiotics Ordered: 1800  Time of 1st antibiotic administration: 1823  Additional action taken by pharmacy: none  If necessary, Name of Provider/Nurse Contacted: na    Thomasenia Sales ,PharmD Clinical Pharmacist  01/23/2018  7:05 PM

## 2018-01-23 NOTE — ED Notes (Signed)
Charge RN note: Patient to ED via EMS from home needing monthly catheter change, VSS, no acute distress noted.  To lobby via EMS.

## 2018-01-23 NOTE — ED Provider Notes (Addendum)
[facility] Emergency Department Provider Note ____________________________________________   First MD Initiated Contact with Patient 01/23/18 1650     (approximate)  I have reviewed the triage vital signs and the nursing notes.   HISTORY  Chief Complaint Nausea; urinary catheter change; and Weakness    HPI Julia Shea is a 62 y.o. female with PMH as noted below including chronic indwelling Foley catheter due to "a hole in my bladder" and recent admission for UTI and sepsis last month presents with generalized weakness, gradual onset over the last several days, associated with nausea but no vomiting, as well as decreased p.o. intake.  Patient reports subjective chills as well, and some diarrhea today.  She denies chest pain or difficulty breathing.  She states she feels similar to when she was admitted last month.   Past Medical History:  Diagnosis Date  . Asthma   . 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 Chronic indwelling Foley catheter   . Depression   . Diabetes mellitus   . Hypertension   . MRSA infection     Patient Active Problem List   Diagnosis Date Noted  . Pressure injury of skin 12/22/2017  . Sepsis ([place]) 12/21/2017  . Hepatic steatosis 05/15/2013  . Abdominal pain, right upper quadrant 05/07/2013  . Breast cancer screening 11/14/2011  . Hypertriglyceridemia 10/30/2011  . Depression 10/26/2011  . Diabetes mellitus type 2, uncontrolled ([place]) 10/26/2011  . Hypertension 10/26/2011    Past Surgical History:  Procedure Laterality Date  . LAPAROSCOPIC OOPHORECTOMY     removal of cyst    Prior to Admission medications   Medication Sig Start Date End Date Taking? Authorizing Provider  cloNIDine (CATAPRES) 0.1 MG tablet Take 1 tablet (0.1 mg total) by mouth 2 (two) times daily. 12/23/17  Yes Sainani, Belia Heman, MD  glimepiride (AMARYL) 1 MG tablet Take 1 mg by mouth  daily with breakfast.   Yes [provider]  insulin NPH-regular Human (NOVOLIN 70/30) (70-30) 100 UNIT/ML injection Inject 10 Units into the skin 2 (two) times daily with a meal. 11/13/17  Yes Harvest Dark, MD  lisinopril (PRINIVIL,ZESTRIL) 20 MG tablet Take 1 tablet (20 mg total) by mouth daily. 12/23/17 02/21/18 Yes Sainani, Belia Heman, MD  metFORMIN (GLUCOPHAGE) 500 MG tablet Take 500 mg by mouth 2 (two) times daily.   Yes [provider]  Multiple Vitamins-Minerals (MULTIVITAMIN WITH MINERALS) tablet Take 1 tablet by mouth daily.   Yes [provider]  Insulin Pen Needle (NOVOFINE) 32G X 6 MM MISC 1 Units by Does not apply route as needed. 10/30/11   Jackolyn Confer, MD  Insulin Syringes, Disposable, U-100 0.3 ML MISC 1 Syringe by Does not apply route 2 (two) times daily. 12/19/17   Tukov-Yual, Arlyss Gandy, NP  pantoprazole (PROTONIX) 40 MG tablet Take 1 tablet (40 mg total) by mouth daily. Patient not taking: Reported on 12/21/2017 05/15/13   Jackolyn Confer, MD    Allergies Penicillins and Sulfa antibiotics  Family History  Problem Relation Age of Onset  . Diabetes Maternal Uncle     Social History Social History   Tobacco Use  . Smoking status: Never Smoker  . Smokeless tobacco: Never Used  Substance Use Topics  . Alcohol use: No  . Drug use: No    Review of Systems  Constitutional: Positive for chills. Eyes: No redness. ENT: No sore throat. Cardiovascular: Denies chest pain.  Respiratory: Denies shortness of breath. Gastrointestinal: No nausea, no vomi positive for nausea.  Genitourinary: Positive for cloudy urine.  Musculoskeletal: Negative for back pain. Skin: Negative for rash. Neurological: Negative for headache.   ____________________________________________   PHYSICAL EXAM:  VITAL SIGNS: ED Triage Vitals  Enc Vitals Group     BP 01/23/18 1640 (!) 83/64     Pulse Rate 01/23/18 1640 (!) 101     Resp 01/23/18 1640 18      Temp 01/23/18 1640 97.6 F (36.4 C)     Temp Source 01/23/18 1640 Oral     SpO2 01/23/18 1640 99 %     Weight 01/23/18 1641 110 lb (49.9 kg)     Height 01/23/18 1641 5\' 2"  (1.575 m)     Head Circumference --      Peak Flow --      Pain Score 01/23/18 1641 10     Pain Loc --      Pain Edu? --      Excl. in [place]? --     Constitutional: Alert and oriented.  Somewhat weak appearing but in no acute distress. Eyes: Conjunctivae are normal.  EOMI.  PERRLA. Head: Atraumatic. Nose: No congestion/rhinnorhea. Mouth/Throat: Mucous membranes are dry.   Neck: Normal range of motion.  Cardiovascular: Borderline tachycardic, regular rhythm. Grossly normal heart sounds.  Good peripheral circulation. Respiratory: Normal respiratory effort.  No retractions. Lungs CTAB. Gastrointestinal: Soft and nontender. No distention.  Genitourinary: No flank tenderness. Musculoskeletal: No lower extremity edema.  Extremities warm and well perfused.  Neurologic:  Normal speech and language. No gross focal neurologic deficits are appreciated.  Skin:  Skin is warm and dry. No rash noted. Psychiatric: Mood and affect are normal. Speech and behavior are normal.  ____________________________________________   LABS (all labs ordered are listed, but only abnormal results are displayed)  Labs Reviewed  BASIC METABOLIC PANEL - Abnormal; Notable for the following components:      Result Value   Chloride 100 (*)    Glucose, Bld 135 (*)    BUN 26 (*)    Calcium 10.5 (*)    All other components within normal limits  CBC - Abnormal; Notable for the following components:   WBC 13.1 (*)    MCV 78.8 (*)    RDW 17.4 (*)    All other components within normal limits  URINALYSIS, COMPLETE (UACMP) WITH MICROSCOPIC - Abnormal; Notable for the following components:   Color, Urine YELLOW (*)    APPearance TURBID (*)    Hgb urine dipstick MODERATE (*)    Protein, ur 100 (*)    Leukocytes, UA LARGE (*)    RBC / HPF >50 (*)      WBC, UA >50 (*)    All other components within normal limits  LACTIC ACID, PLASMA - Abnormal; Notable for the following components:   Lactic Acid, Venous 2.5 (*)    All other components within normal limits  CULTURE, BLOOD (ROUTINE X 2)  CULTURE, BLOOD (ROUTINE X 2)  LACTIC ACID, PLASMA  CBG MONITORING, ED   ____________________________________________  EKG  ED ECG REPORT I, Arta Silence, the attending physician, personally viewed and interpreted this ECG.  Date: 01/23/2018 EKG Time: 1644 Rate: 104 Rhythm: Sinus tachycardia QRS Axis: normal Intervals: normal ST/T Wave abnormalities: normal Narrative Interpretation: no evidence of acute ischemia  ____________________________________________  RADIOLOGY  CXR: No focal infiltrate  ____________________________________________   PROCEDURES  Procedure(s) performed: No  Procedures  Critical Care performed: Yes  CRITICAL CARE Performed by: Arta Silence   Total critical care time: 35 minutes  Critical care time was exclusive of separately billable procedures and treating other patients.  Critical care was necessary to treat or prevent imminent or life-threatening deterioration.  Critical care was time spent personally by me on the following activities: development of treatment plan with patient and/or surrogate as well as nursing, discussions with consultants, evaluation of patient's response to treatment, examination of patient, obtaining history from patient or surrogate, ordering and performing treatments and interventions, ordering and review of laboratory studies, ordering and review of radiographic studies, pulse oximetry and re-evaluation of patient's condition.  ____________________________________________   INITIAL IMPRESSION / ASSESSMENT AND PLAN / ED COURSE  Pertinent labs & imaging results that were available during my care of the patient were reviewed by me and considered in my medical  decision making (see chart for details).  62 year old female with PMH as noted above including chronic indwelling Foley catheter and admission last month for UTI and sepsis presents with increased generalized weakness, dizziness, nausea, and cloudy urine from the Foley.  I reviewed the past medical records in [place]; I confirmed the patient was most recently admitted in late March for UTI and sepsis.  On exam, the patient is somewhat disheveled and weak appearing, but in no acute distress.  She is hypotensive and borderline tachycardic but other vital signs are normal.  The remainder the exam is as described above.  Presentation is most concerning for recurrent sepsis related to UTI, however differential also includes dehydration or other electrolyte or metabolic disturbance, other source of infection, or  poor p.o. Intake.  Plan for infection/sepsis work-up, IV fluids, and reassess.  Anticipate likely admission.     ----------------------------------------- 7:27 PM on 01/23/2018 -----------------------------------------  Work-up is consistent with UTI.  Cefepime has been initiated, and the patient's blood pressure significantly improved after fluids.  Given the evidence of sepsis, the patient will be admitted. ____________________________________________   FINAL CLINICAL IMPRESSION(S) / ED DIAGNOSES  Final diagnoses:  Urinary tract infection without hematuria, site unspecified  Sepsis, due to unspecified organism [facility])      NEW MEDICATIONS STARTED DURING THIS VISIT:  New Prescriptions   No medications on file     Note:  This document was prepared using Dragon voice recognition software and may include unintentional dictation errors.    Arta Silence, MD 01/23/18 Ines Bloomer, MD 02/12/18 [phone]

## 2018-01-23 NOTE — H&P (Signed)
[place] [facility] [place] NAME: Julia Shea    MR#:  [id number]  DATE OF BIRTH:  13-Aug-1956  DATE OF ADMISSION:  01/23/2018  PRIMARY CARE PHYSICIAN: System, Pcp Not In   REQUESTING/REFERRING PHYSICIAN:   CHIEF COMPLAINT:   Chief Complaint  Patient presents with  . Nausea  . urinary catheter change  . Weakness    HISTORY OF PRESENT ILLNESS: Julia Shea  is a 62 y.o. female with a known history of bronchial asthma, diabetes mellitus type 2, hypertension, bladder dysfunction with chronic indwelling Foley catheter presented to the emergency room with generalized weakness, nausea and chills.  Patient was found to be having low blood pressure when she presented to the emergency room.  Urinalysis showed infection and her lactic acid level was elevated.  She was given IV cefepime antibiotic and bolused with IV fluids.  Blood pressure responded to IV fluids.  Has generalized weakness.  PAST MEDICAL HISTORY:   Past Medical History:  Diagnosis Date  . Asthma   . 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 Chronic indwelling Foley catheter   . Depression   . Diabetes mellitus   . Hypertension   . MRSA infection     PAST SURGICAL HISTORY:  Past Surgical History:  Procedure Laterality Date  . LAPAROSCOPIC OOPHORECTOMY     removal of cyst    SOCIAL HISTORY:  Social History   Tobacco Use  . Smoking status: Never Smoker  . Smokeless tobacco: Never Used  Substance Use Topics  . Alcohol use: No    FAMILY HISTORY:  Family History  Problem Relation Age of Onset  . Diabetes Maternal Uncle     DRUG ALLERGIES:  Allergies  Allergen Reactions  . Penicillins     Has patient had a PCN reaction causing immediate rash, facial/tongue/throat swelling, SOB or lightheadedness with hypotension: Yes Has patient had a PCN reaction causing severe rash involving mucus membranes or  skin necrosis: Yes Has patient had a PCN reaction that required hospitalization: Yes Has patient had a PCN reaction occurring within the last 10 years: Yes If all of the above answers are "NO", then may proceed with Cephalosporin use.  . Sulfa Antibiotics     REVIEW OF SYSTEMS:   CONSTITUTIONAL: No fever, has fatigue and weakness.  Has chills EYES: No blurred or double vision.  EARS, NOSE, AND THROAT: No tinnitus or ear pain.  RESPIRATORY: No cough, shortness of breath, wheezing or hemoptysis.  CARDIOVASCULAR: No chest pain, orthopnea, edema.  GASTROINTESTINAL: No nausea, vomiting, diarrhea or abdominal pain.  GENITOURINARY: Has dysuria,  No hematuria.  ENDOCRINE: No polyuria, nocturia,  HEMATOLOGY: No anemia, easy bruising or bleeding SKIN: No rash or lesion. MUSCULOSKELETAL: No joint pain or arthritis.   NEUROLOGIC: No tingling, numbness, weakness.  PSYCHIATRY: No anxiety or depression.   MEDICATIONS AT HOME:  Prior to Admission medications   Medication Sig Start Date End Date Taking? Authorizing Provider  cloNIDine (CATAPRES) 0.1 MG tablet Take 1 tablet (0.1 mg total) by mouth 2 (two) times daily. 12/23/17  Yes Sainani, Belia Heman, MD  glimepiride (AMARYL) 1 MG tablet Take 1 mg by mouth daily with breakfast.   Yes [provider]  insulin NPH-regular Human (NOVOLIN 70/30) (70-30) 100 UNIT/ML injection Inject 10 Units into the skin 2 (two) times daily with a meal. 11/13/17  Yes Harvest Dark, MD  lisinopril (PRINIVIL,ZESTRIL) 20 MG  tablet Take 1 tablet (20 mg total) by mouth daily. 12/23/17 02/21/18 Yes Sainani, Belia Heman, MD  metFORMIN (GLUCOPHAGE) 500 MG tablet Take 500 mg by mouth 2 (two) times daily.   Yes [provider]  Multiple Vitamins-Minerals (MULTIVITAMIN WITH MINERALS) tablet Take 1 tablet by mouth daily.   Yes [provider]  Insulin Pen Needle (NOVOFINE) 32G X 6 MM MISC 1 Units by Does not apply route as needed. 10/30/11   Jackolyn Confer,  MD  Insulin Syringes, Disposable, U-100 0.3 ML MISC 1 Syringe by Does not apply route 2 (two) times daily. 12/19/17   Tukov-Yual, Arlyss Gandy, NP  pantoprazole (PROTONIX) 40 MG tablet Take 1 tablet (40 mg total) by mouth daily. Patient not taking: Reported on 12/21/2017 05/15/13   Jackolyn Confer, MD      PHYSICAL EXAMINATION:   VITAL SIGNS: Blood pressure (!) 142/83, pulse 92, temperature 97.6 F (36.4 C), temperature source Oral, resp. rate 12, height 5\' 2"  (1.575 m), weight 49.9 kg (110 lb), SpO2 100 %.  GENERAL:  62 y.o.-year-old patient lying in the bed with no acute distress.  EYES: Pupils equal, round, reactive to light and accommodation. No scleral icterus. Extraocular muscles intact.  HEENT: Head atraumatic, normocephalic. Oropharynx dry and nasopharynx clear.  NECK:  Supple, no jugular venous distention. No thyroid enlargement, no tenderness.  LUNGS: Normal breath sounds bilaterally, no wheezing, rales,rhonchi or crepitation. No use of accessory muscles of respiration.  CARDIOVASCULAR: S1, S2 normal. No murmurs, rubs, or gallops.  ABDOMEN: Soft, nontender, nondistended. Bowel sounds present. No organomegaly or mass.  Genitourinary chronic indwelling Foley catheter  EXTREMITIES: No pedal edema, cyanosis, or clubbing.  NEUROLOGIC: Cranial nerves II through XII are intact. Muscle strength 5/5 in all extremities. Sensation intact. Gait not checked.  PSYCHIATRIC: The patient is alert and oriented x 3.  SKIN: No obvious rash, lesion, or ulcer.   LABORATORY PANEL:   CBC Recent Labs  Lab 01/23/18 1712  WBC 13.1*  HGB 12.6  HCT 36.1  PLT 399  MCV 78.8*  MCH 27.5  MCHC 34.9  RDW 17.4*   ------------------------------------------------------------------------------------------------------------------  Chemistries  Recent Labs  Lab 01/23/18 1712  NA 136  K 3.7  CL 100*  CO2 25  GLUCOSE 135*  BUN 26*  CREATININE 0.73  CALCIUM 10.5*    ------------------------------------------------------------------------------------------------------------------ estimated creatinine clearance is 56.7 mL/min (by C-G formula based on SCr of 0.73 mg/dL). ------------------------------------------------------------------------------------------------------------------ No results for input(s): TSH, T4TOTAL, T3FREE, THYROIDAB in the last 72 hours.  Invalid input(s): FREET3   Coagulation profile No results for input(s): INR, PROTIME in the last 168 hours. ------------------------------------------------------------------------------------------------------------------- No results for input(s): DDIMER in the last 72 hours. -------------------------------------------------------------------------------------------------------------------  Cardiac Enzymes No results for input(s): CKMB, TROPONINI, MYOGLOBIN in the last 168 hours.  Invalid input(s): CK ------------------------------------------------------------------------------------------------------------------ Invalid input(s): POCBNP  ---------------------------------------------------------------------------------------------------------------  Urinalysis    Component Value Date/Time   COLORURINE YELLOW (A) 01/23/2018 1713   APPEARANCEUR TURBID (A) 01/23/2018 1713   LABSPEC 1.020 01/23/2018 1713   PHURINE 5.0 01/23/2018 1713   GLUCOSEU NEGATIVE 01/23/2018 1713   HGBUR MODERATE (A) 01/23/2018 1713   BILIRUBINUR NEGATIVE 01/23/2018 1713   BILIRUBINUR neg 05/15/2013 1038   KETONESUR NEGATIVE 01/23/2018 1713   PROTEINUR 100 (A) 01/23/2018 1713   UROBILINOGEN 0.2 05/15/2013 1038   NITRITE NEGATIVE 01/23/2018 1713   LEUKOCYTESUR LARGE (A) 01/23/2018 1713     RADIOLOGY: Dg Chest Port 1 View  Result Date: 01/23/2018 CLINICAL DATA:  Hypotension and weakness EXAM: PORTABLE CHEST  1 VIEW COMPARISON:  None. FINDINGS: The heart size and mediastinal contours are within normal  limits. Both lungs are clear. The visualized skeletal structures are unremarkable. IMPRESSION: No active disease. Electronically Signed   By: Ulyses Jarred M.D.   On: 01/23/2018 17:27    EKG: Orders placed or performed during the hospital encounter of 01/23/18  . ED EKG  . ED EKG  . EKG 12-Lead  . EKG 12-Lead    IMPRESSION AND PLAN:   62 year old female patient with history of bladder dysfunction, chronic indwelling Foley catheter for type 2 diabetes mellitus, hypertension presented to the emergency room with low blood pressure, chills.  Sepsis secondary to urinary tract infection IV fluids  start patient on IV cefepime antibiotic  Hypotension IV fluids  Dehydration IV fluid hydration  Bladder dysfunction Has indwelling Foley catheter  Urinary tract infection Continue IV antibiotics   All the records are reviewed and case discussed with ED provider. Management plans discussed with the patient, family and they are in agreement.  CODE STATUS:Full code Code Status History    Date Active Date Inactive Code Status Order ID Comments User Context   12/21/2017 1750 12/23/2017 2222 Full Code [id number]  Saundra Shelling, MD Inpatient       TOTAL TIME TAKING CARE OF THIS PATIENT: 52 minutes.    Saundra Shelling M.D on 01/23/2018 at 8:09 PM  Between 7am to 6pm - Pager - [phone]  After 6pm go to [email or web address] - password [email or web address] [place] [facility]  Office  [phone]  CC: Primary care physician; System, Pcp Not In

## 2018-01-23 NOTE — Progress Notes (Signed)
Pharmacy Antibiotic Note  Julia Shea is a 62 y.o. female admitted on 01/23/2018 with sepsis.  Pharmacy has been consulted for cefepime dosing.  Plan: Patient received cefepime 2g IV x 1  Will continue cefepime 1g IV q12h  Height: 5\' 2"  (157.5 cm) Weight: 105 lb 3.2 oz (47.7 kg) IBW/kg (Calculated) : 50.1  Temp (24hrs), Avg:97.5 F (36.4 C), Min:97.4 F (36.3 C), Max:97.6 F (36.4 C)  Recent Labs  Lab 01/23/18 1712 01/23/18 1713 01/23/18 1928  WBC 13.1*  --   --   CREATININE 0.73  --   --   LATICACIDVEN  --  2.5* 1.1    Estimated Creatinine Clearance: 54.2 mL/min (by C-G formula based on SCr of 0.73 mg/dL).    Allergies  Allergen Reactions  . Penicillins     Has patient had a PCN reaction causing immediate rash, facial/tongue/throat swelling, SOB or lightheadedness with hypotension: Yes Has patient had a PCN reaction causing severe rash involving mucus membranes or skin necrosis: Yes Has patient had a PCN reaction that required hospitalization: Yes Has patient had a PCN reaction occurring within the last 10 years: Yes If all of the above answers are "NO", then may proceed with Cephalosporin use.  . Sulfa Antibiotics     Thank you for allowing pharmacy to be a part of this patient's care.  Tobie Lords, PharmD, BCPS Clinical Pharmacist 01/23/2018

## 2018-01-23 NOTE — Progress Notes (Signed)
[facility] care plan.  Purpose of the Encounter: CODE STATUS Parties in Attendance: Patient Patient's Decision Capacity: Good Subjective/Patient's story: Presented to the emergency room with weakness, chills Objective/Medical story Has UTI, sepsis Goals of care determination:  Advanced directives discussed For now patient wants everything done which includes cardiac resuscitation, intubation and ventilator if need arises CODE STATUS: Full code Time spent discussing advanced care planning: 16 minutes

## 2018-01-24 DIAGNOSIS — E440 Moderate protein-calorie malnutrition: Secondary | ICD-10-CM

## 2018-01-24 LAB — BASIC METABOLIC PANEL
ANION GAP: 7 (ref 5–15)
BUN: 21 mg/dL — ABNORMAL HIGH (ref 6–20)
CO2: 23 mmol/L (ref 22–32)
Calcium: 9.1 mg/dL (ref 8.9–10.3)
Chloride: 107 mmol/L (ref 101–111)
Creatinine, Ser: 0.59 mg/dL (ref 0.44–1.00)
GFR calc Af Amer: >60 mL/min (ref >60)
GLUCOSE: 109 mg/dL — AB (ref 65–99)
POTASSIUM: 3.3 mmol/L — AB (ref 3.5–5.1)
Sodium: 137 mmol/L (ref 135–145)

## 2018-01-24 LAB — BLOOD CULTURE ID PANEL (REFLEXED)
ACINETOBACTER BAUMANNII: NOT DETECTED
CANDIDA GLABRATA: NOT DETECTED
CANDIDA TROPICALIS: NOT DETECTED
Candida albicans: NOT DETECTED
Candida krusei: NOT DETECTED
Candida parapsilosis: NOT DETECTED
ENTEROBACTER CLOACAE COMPLEX: NOT DETECTED
ENTEROBACTERIACEAE SPECIES: NOT DETECTED
Enterococcus species: NOT DETECTED
Escherichia coli: NOT DETECTED
Haemophilus influenzae: NOT DETECTED
KLEBSIELLA PNEUMONIAE: NOT DETECTED
Klebsiella oxytoca: NOT DETECTED
Listeria monocytogenes: NOT DETECTED
Methicillin resistance: DETECTED — AB
NEISSERIA MENINGITIDIS: NOT DETECTED
PSEUDOMONAS AERUGINOSA: NOT DETECTED
Proteus species: NOT DETECTED
STREPTOCOCCUS AGALACTIAE: NOT DETECTED
STREPTOCOCCUS PNEUMONIAE: NOT DETECTED
STREPTOCOCCUS PYOGENES: NOT DETECTED
STREPTOCOCCUS SPECIES: NOT DETECTED
Serratia marcescens: NOT DETECTED
Staphylococcus aureus (BCID): NOT DETECTED
Staphylococcus species: DETECTED — AB

## 2018-01-24 LAB — GLUCOSE, CAPILLARY
GLUCOSE-CAPILLARY: 100 mg/dL — AB (ref 65–99)
GLUCOSE-CAPILLARY: 157 mg/dL — AB (ref 65–99)
GLUCOSE-CAPILLARY: 91 mg/dL (ref 65–99)
Glucose-Capillary: 137 mg/dL — ABNORMAL HIGH (ref 65–99)

## 2018-01-24 LAB — CBC
HEMATOCRIT: 32.1 % — AB (ref 35.0–47.0)
HEMOGLOBIN: 11.1 g/dL — AB (ref 12.0–16.0)
MCH: 27.4 pg (ref 26.0–34.0)
MCHC: 34.6 g/dL (ref 32.0–36.0)
MCV: 79.2 fL — ABNORMAL LOW (ref 80.0–100.0)
Platelets: 288 10*3/uL (ref 150–440)
RBC: 4.05 MIL/uL (ref 3.80–5.20)
RDW: 17.2 % — ABNORMAL HIGH (ref 11.5–14.5)
WBC: 10.1 10*3/uL (ref 3.6–11.0)

## 2018-01-24 LAB — PHOSPHORUS: Phosphorus: 2.8 mg/dL (ref 2.5–4.6)

## 2018-01-24 LAB — MRSA PCR SCREENING: MRSA by PCR: POSITIVE — AB

## 2018-01-24 LAB — MAGNESIUM: Magnesium: 1.5 mg/dL — ABNORMAL LOW (ref 1.7–2.4)

## 2018-01-24 MED ORDER — TRAMADOL HCL 50 MG PO TABS
50.0000 mg | ORAL_TABLET | Freq: Four times a day (QID) | ORAL | Status: DC | PRN
Start: 2018-01-24 — End: 2018-01-28
  Administered 2018-01-24 – 2018-01-28 (×12): 50 mg via ORAL
  Filled 2018-01-24 (×12): qty 1

## 2018-01-24 MED ORDER — CHLORHEXIDINE GLUCONATE CLOTH 2 % EX PADS
6.0000 | MEDICATED_PAD | Freq: Every day | CUTANEOUS | Status: AC
  Administered 2018-01-24 – 2018-01-28 (×5): 6 via TOPICAL

## 2018-01-24 MED ORDER — POTASSIUM CHLORIDE CRYS ER 20 MEQ PO TBCR
40.0000 meq | EXTENDED_RELEASE_TABLET | ORAL | Status: AC
  Administered 2018-01-24 (×2): 40 meq via ORAL
  Filled 2018-01-24 (×2): qty 2

## 2018-01-24 MED ORDER — MUPIROCIN 2 % EX OINT
1.0000 "application " | TOPICAL_OINTMENT | Freq: Two times a day (BID) | CUTANEOUS | Status: AC
  Administered 2018-01-24 – 2018-01-28 (×10): 1 via NASAL
  Filled 2018-01-24: qty 22

## 2018-01-24 NOTE — Care Management (Signed)
Patient has a chronic foley due to "having a hole" in her bladder.  She is followed by [facility] and transitioning to [facility] for her medications. Says "someone" from [facility] saw her in her home within the lat couple of days and trying to open her to care but there was an issue with PCP.  contacted [facility] and patient was being seen by agency - RN PT Aide, but patient did not keep her appointments and the provider would not sign order to continue with home health.  Spoke with patient and informed her of the issue.  She did not volunteer to CM that she ws not keeping her appointments

## 2018-01-24 NOTE — Care Management (Signed)
Physician advisor will sign orders for home health until patient is seen at [facility] on 02/06/2018.  she will be seen by orthopedist at [facility] on 5/1. Patient has questions about going to "nursing home" and assisted living.  Discussed that unless she can pay privately, she has no coverage at present.  She has initiated a disability application but the initial application has not been completed in its entirety. Discussed this  is a long process. Updated [facility] that Dr sparks will follow for home health until 5/9.  Patient says she has some transportation issues that hinders her keeping appointments but does not elaborate.  Will benefit from having home health SW address

## 2018-01-24 NOTE — Progress Notes (Signed)
PHARMACY - PHYSICIAN COMMUNICATION CRITICAL VALUE ALERT - BLOOD CULTURE IDENTIFICATION (BCID)  Julia Shea is an 62 y.o. female who presented to [facility] on 01/23/2018 with a chief complaint of urosepsis  Assessment:  On cefepime for suspected urosepsis (hx of pseudomonal UTI). CONS in 1 of 2 blood cultures  Name of physician (or Provider) Contacted: Gouru  Current antibiotics: Cefepime  Changes to prescribed antibiotics recommended:  No change at this time, continue to follow  Results for orders placed or performed during the hospital encounter of 01/23/18  Blood Culture ID Panel (Reflexed) (Collected: 01/23/2018  5:13 PM)  Result Value Ref Range   Enterococcus species NOT DETECTED NOT DETECTED   Listeria monocytogenes NOT DETECTED NOT DETECTED   Staphylococcus species DETECTED (A) NOT DETECTED   Staphylococcus aureus NOT DETECTED NOT DETECTED   Methicillin resistance DETECTED (A) NOT DETECTED   Streptococcus species NOT DETECTED NOT DETECTED   Streptococcus agalactiae NOT DETECTED NOT DETECTED   Streptococcus pneumoniae NOT DETECTED NOT DETECTED   Streptococcus pyogenes NOT DETECTED NOT DETECTED   Acinetobacter baumannii NOT DETECTED NOT DETECTED   Enterobacteriaceae species NOT DETECTED NOT DETECTED   Enterobacter cloacae complex NOT DETECTED NOT DETECTED   Escherichia coli NOT DETECTED NOT DETECTED   Klebsiella oxytoca NOT DETECTED NOT DETECTED   Klebsiella pneumoniae NOT DETECTED NOT DETECTED   Proteus species NOT DETECTED NOT DETECTED   Serratia marcescens NOT DETECTED NOT DETECTED   Haemophilus influenzae NOT DETECTED NOT DETECTED   Neisseria meningitidis NOT DETECTED NOT DETECTED   Pseudomonas aeruginosa NOT DETECTED NOT DETECTED   Candida albicans NOT DETECTED NOT DETECTED   Candida glabrata NOT DETECTED NOT DETECTED   Candida krusei NOT DETECTED NOT DETECTED   Candida parapsilosis NOT DETECTED NOT DETECTED   Candida tropicalis NOT DETECTED NOT DETECTED     Azzan Butler C 01/24/2018  3:26 PM

## 2018-01-24 NOTE — Progress Notes (Signed)
Pt complaining of left leg pain. States that she has this pain at home and usually takes 4 tylenol. Pt stated that prior admissions in the hospital they have given her tramadol. Primary nurse page and spoke to Prime Dr. MD to place orders. Primary nurse to continue to monitor.

## 2018-01-24 NOTE — Progress Notes (Signed)
[facility] [place] NAME: Julia Shea    MR#:  [id number]  DATE OF BIRTH:  10/12/1955  SUBJECTIVE:  CHIEF COMPLAINT: Patient is resting comfortably has some nausea but tolerating diet.  Denies any vomiting or abdominal pain.  No other complaints  REVIEW OF SYSTEMS:  CONSTITUTIONAL: No fever, fatigue or weakness.  EYES: No blurred or double vision.  EARS, NOSE, AND THROAT: No tinnitus or ear pain.  RESPIRATORY: No cough, shortness of breath, wheezing or hemoptysis.  CARDIOVASCULAR: No chest pain, orthopnea, edema.  GASTROINTESTINAL: No nausea, vomiting, diarrhea or abdominal pain.  GENITOURINARY: No dysuria, hematuria.  ENDOCRINE: No polyuria, nocturia,  HEMATOLOGY: No anemia, easy bruising or bleeding SKIN: No rash or lesion. MUSCULOSKELETAL: No joint pain or arthritis.   NEUROLOGIC: No tingling, numbness, weakness.  PSYCHIATRY: No anxiety or depression.   DRUG ALLERGIES:   Allergies  Allergen Reactions  . Penicillins     Has patient had a PCN reaction causing immediate rash, facial/tongue/throat swelling, SOB or lightheadedness with hypotension: Yes Has patient had a PCN reaction causing severe rash involving mucus membranes or skin necrosis: Yes Has patient had a PCN reaction that required hospitalization: Yes Has patient had a PCN reaction occurring within the last 10 years: Yes If all of the above answers are "NO", then may proceed with Cephalosporin use.  . Sulfa Antibiotics     VITALS:  Blood pressure (!) 148/78, pulse 75, temperature 97.7 F (36.5 C), temperature source Oral, resp. rate 12, height 5\' 2"  (1.575 m), weight 49.5 kg (109 lb 1.6 oz), SpO2 100 %.  PHYSICAL EXAMINATION:  GENERAL:  62 y.o.-year-old patient lying in the bed with no acute distress.  EYES: Pupils equal, round, reactive to light and accommodation. No scleral icterus. Extraocular muscles intact.  HEENT: Head atraumatic, normocephalic.  Oropharynx and nasopharynx clear.  NECK:  Supple, no jugular venous distention. No thyroid enlargement, no tenderness.  LUNGS: Normal breath sounds bilaterally, no wheezing, rales,rhonchi or crepitation. No use of accessory muscles of respiration.  CARDIOVASCULAR: S1, S2 normal. No murmurs, rubs, or gallops.  ABDOMEN: Soft, nontender, nondistended. Bowel sounds present. No organomegaly or mass.  EXTREMITIES: No pedal edema, cyanosis, or clubbing.  NEUROLOGIC: Cranial nerves II through XII are intact. Muscle strength 5/5 in all extremities. Sensation intact. Gait not checked.  PSYCHIATRIC: The patient is alert and oriented x 3.  SKIN: No obvious rash, lesion, or ulcer.    LABORATORY PANEL:   CBC Recent Labs  Lab 01/24/18 0509  WBC 10.1  HGB 11.1*  HCT 32.1*  PLT 288   ------------------------------------------------------------------------------------------------------------------  Chemistries  Recent Labs  Lab 01/24/18 0509  NA 137  K 3.3*  CL 107  CO2 23  GLUCOSE 109*  BUN 21*  CREATININE 0.59  CALCIUM 9.1  MG 1.5*   ------------------------------------------------------------------------------------------------------------------  Cardiac Enzymes No results for input(s): TROPONINI in the last 168 hours. ------------------------------------------------------------------------------------------------------------------  RADIOLOGY:  Dg Chest Port 1 View  Result Date: 01/23/2018 CLINICAL DATA:  Hypotension and weakness EXAM: PORTABLE CHEST 1 VIEW COMPARISON:  None. FINDINGS: The heart size and mediastinal contours are within normal limits. Both lungs are clear. The visualized skeletal structures are unremarkable. IMPRESSION: No active disease. Electronically Signed   By: Ulyses Jarred M.D.   On: 01/23/2018 17:27    EKG:   Orders placed or performed during the hospital encounter of 01/23/18  . ED EKG  . ED EKG  . EKG 12-Lead  . EKG 12-Lead  ASSESSMENT AND PLAN:    62 year old female patient with history of bladder dysfunction, chronic indwelling Foley catheter for type 2 diabetes mellitus, hypertension presented to the emergency room with low blood pressure, chills.  Sepsis secondary to urinary tract infection IV fluids   on IV cefepime antibiotic  Hypotension from dehydration IV fluids  Bladder dysfunction Has indwelling Foley catheter   Diabetes mental type II Sliding scale insulin, continue home medication metformin and glimepiride  Essential hypertension continue clonidine,lisinopril       All the records are reviewed and case discussed with Care Management/Social Workerr. Management plans discussed with the patient, family and they are in agreement.  CODE STATUS: FC   TOTAL TIME TAKING CARE OF THIS PATIENT: 36 minutes.   POSSIBLE D/C IN  DAYS, DEPENDING ON CLINICAL CONDITION.  Note: This dictation was prepared with Dragon dictation along with smaller phrase technology. Any transcriptional errors that result from this process are unintentional.   Nicholes Mango M.D on 01/24/2018 at 3:24 PM  Between 7am to 6pm - Pager - [phone] After 6pm go to [email or web address] - password [email or web address] [facility]  Office  [phone]  CC: Primary care physician; System, Pcp Not In

## 2018-01-24 NOTE — Progress Notes (Signed)
Initial Nutrition Assessment  DOCUMENTATION CODES:   Non-severe (moderate) malnutrition in context of chronic illness  INTERVENTION:   Carnation Instant Breakfast BID- each packet provides 130kcal and 5g protein   MVI daily  Magic cup TID with meals, each supplement provides 290 kcal and 9 grams of protein  Snacks  NUTRITION DIAGNOSIS:   Moderate Malnutrition related to chronic illness(bladder issues, sepsis, UTIs) as evidenced by moderate muscle depletion, moderate fat depletion.  GOAL:   Patient will meet greater than or equal to 90% of their needs  MONITOR:   PO intake, Supplement acceptance, Labs, Weight trends, Skin, I & O's  REASON FOR ASSESSMENT:   Malnutrition Screening Tool    ASSESSMENT:   62 y.o. female with a known history of bronchial asthma, diabetes mellitus type 2, hypertension, bladder dysfunction with chronic indwelling Foley catheter presented to the emergency room with generalized weakness, nausea and chills.    Met with pt in room today. Pt reports intermittent poor appetite pta, pt states "sometimes I eat well and sometimes I don't". Pt reports a large decrease in her appetite for 1 week pta r/t nausea. Pt reports eating 75% of her breakfast this morning. Pt drinks vanilla carnation instant breakfast at home with 2% milk. Pt reports that she used to weigh over 200lbs but recently her UBW has been around 110-115lbs. 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 Pt also reports tingling and numbness in her fingers and toes that has been occurring since November; pt with h/o B12 deficiency per her report. RD recommended having B12, B6, and folate labs checked as an outpatient. Also recommend check Mg and P labs while pt hospitalized. RD will order supplements and snacks. Liberalize diet.     Medications reviewed  and include: lovenox, glimepiride, insulin, metformin, MVI, protonix, zofran, tramadol  Labs reviewed: K 3.3(L) cbgs- 135, 109 x 24 hrs AIC 8.4(H)- 3/21  NUTRITION - FOCUSED PHYSICAL EXAM:    Most Recent Value  Orbital Region  No depletion  Upper Arm Region  Severe depletion  Thoracic and Lumbar Region  Moderate depletion  Buccal Region  No depletion  Temple Region  Moderate depletion  Clavicle Bone Region  Mild depletion  Clavicle and Acromion Bone Region  Mild depletion  Scapular Bone Region  Mild depletion  Dorsal Hand  Mild depletion  Patellar Region  Severe depletion  Anterior Thigh Region  Severe depletion  Posterior Calf Region  Severe depletion  Edema (RD Assessment)  None  Hair  Reviewed  Eyes  Reviewed  Mouth  Reviewed  Skin  Reviewed  Nails  Reviewed     Diet Order:  Diet heart healthy/carb modified Room service appropriate? Yes; Fluid consistency: Thin  EDUCATION NEEDS:   Education needs have been addressed  Skin:  Skin Assessment: Reviewed RN Assessment  Last BM:  pta  Height:   Ht Readings from Last 1 Encounters:  01/23/18 '5\' 2"'$  (1.575 m)    Weight:   Wt Readings from Last 1 Encounters:  01/24/18 109 lb 1.6 oz (49.5 kg)    Ideal Body Weight:  50 kg  BMI:  Body mass index is 19.95 kg/m.  Estimated Nutritional Needs:   Kcal:  1300-1500kcal/day   Protein:  64-74g/day   Fluid:  >1.2L/day   Koleen Distance MS, RD, LDN Pager #[phone] After Hours Pager:  [phone]

## 2018-01-25 LAB — GLUCOSE, CAPILLARY
GLUCOSE-CAPILLARY: 105 mg/dL — AB (ref 65–99)
GLUCOSE-CAPILLARY: 100 mg/dL — AB (ref 65–99)
GLUCOSE-CAPILLARY: 94 mg/dL (ref 65–99)
Glucose-Capillary: 106 mg/dL — ABNORMAL HIGH (ref 65–99)

## 2018-01-25 LAB — URINE CULTURE: Culture: NO GROWTH

## 2018-01-25 NOTE — Progress Notes (Signed)
[facility] [place] NAME: Julia Shea    MR#:  [id number]  DATE OF BIRTH:  1956-04-12  SUBJECTIVE: Complains of nausea, left flank pain, urine cultures are pending.  CHIEF COMPLAINT:   REVIEW OF SYSTEMS:  CONSTITUTIONAL: No fever, fatigue or weakness.  EYES: No blurred or double vision.  EARS, NOSE, AND THROAT: No tinnitus or ear pain.  RESPIRATORY: No cough, shortness of breath, wheezing or hemoptysis.  CARDIOVASCULAR: No chest pain, orthopnea, edema.  GASTROINTESTINAL: No nausea, vomiting, diarrhea or abdominal pain.  GENITOURINARY: No dysuria, hematuria.  ENDOCRINE: No polyuria, nocturia,  HEMATOLOGY: No anemia, easy bruising or bleeding SKIN: No rash or lesion. MUSCULOSKELETAL: No joint pain or arthritis.   NEUROLOGIC: No tingling, numbness, weakness.  PSYCHIATRY: No anxiety or depression.   DRUG ALLERGIES:   Allergies  Allergen Reactions  . Penicillins     Has patient had a PCN reaction causing immediate rash, facial/tongue/throat swelling, SOB or lightheadedness with hypotension: Yes Has patient had a PCN reaction causing severe rash involving mucus membranes or skin necrosis: Yes Has patient had a PCN reaction that required hospitalization: Yes Has patient had a PCN reaction occurring within the last 10 years: Yes If all of the above answers are "NO", then may proceed with Cephalosporin use.  . Sulfa Antibiotics     VITALS:  Blood pressure (!) 145/76, pulse 73, temperature 98 F (36.7 C), temperature source Oral, resp. rate 19, height 5\' 2"  (1.575 m), weight 49.5 kg (109 lb 1.6 oz), SpO2 100 %.  PHYSICAL EXAMINATION:  GENERAL:  62 y.o.-year-old patient lying in the bed with no acute distress.  Patient appears malnourished EYES: Pupils equal, round, reactive to light and accommodation. No scleral icterus. Extraocular muscles intact.  HEENT: Head atraumatic, normocephalic. Oropharynx and nasopharynx clear.  NECK:   Supple, no jugular venous distention. No thyroid enlargement, no tenderness.  LUNGS: Normal breath sounds bilaterally, no wheezing, rales,rhonchi or crepitation. No use of accessory muscles of respiration.  CARDIOVASCULAR: S1, S2 normal. No murmurs, rubs, or gallops.  ABDOMEN: Soft, nontender, nondistended. Bowel sounds present. No organomegaly or mass.  Slight left lower quadrant tenderness present EXTREMITIES: No pedal edema, cyanosis, or clubbing.  NEUROLOGIC: Cranial nerves II through XII are intact. Muscle strength 5/5 in all extremities. Sensation intact. Gait not checked.  PSYCHIATRIC: The patient is alert and oriented x 3.  SKIN: No obvious rash, lesion, or ulcer.    LABORATORY PANEL:   CBC Recent Labs  Lab 01/24/18 0509  WBC 10.1  HGB 11.1*  HCT 32.1*  PLT 288   ------------------------------------------------------------------------------------------------------------------  Chemistries  Recent Labs  Lab 01/24/18 0509  NA 137  K 3.3*  CL 107  CO2 23  GLUCOSE 109*  BUN 21*  CREATININE 0.59  CALCIUM 9.1  MG 1.5*   ------------------------------------------------------------------------------------------------------------------  Cardiac Enzymes No results for input(s): TROPONINI in the last 168 hours. ------------------------------------------------------------------------------------------------------------------  RADIOLOGY:  Dg Chest Port 1 View  Result Date: 01/23/2018 CLINICAL DATA:  Hypotension and weakness EXAM: PORTABLE CHEST 1 VIEW COMPARISON:  None. FINDINGS: The heart size and mediastinal contours are within normal limits. Both lungs are clear. The visualized skeletal structures are unremarkable. IMPRESSION: No active disease. Electronically Signed   By: Ulyses Jarred M.D.   On: 01/23/2018 17:27    EKG:   Orders placed or performed during the hospital encounter of 01/23/18  . ED EKG  . ED EKG  . EKG 12-Lead  . EKG 12-Lead  ASSESSMENT AND  PLAN:   62 year old female patient with history of bladder dysfunction, chronic indwelling Foley catheter for type 2 diabetes mellitus, hypertension presented to the emergency room with low blood pressure, chills.  Sepsis secondary to urinary tract infection IV fluids   on IV cefepime, follow urine cultures.  Hypotension from dehydration IV fluids  Bladder dysfunction Has indwelling Foley catheter Now has UTI, follow urine cultures, continue IV cefepime.  On contact isolation because of positive MRSA screen.  Diabetes mental type II Sliding scale insulin, continue home medication metformin and glimepiride  Essential hypertension continue clonidine,lisinopril   case manager is following [facility].  We will discharge her in next week with home health physical therapy, nurse, social worker she told me that she keeps falling at home because of hip pain and generalized weakness requesting services at home.  Will get physical therapy evaluation. All the records are reviewed and case discussed with Care Management/Social Workerr. Management plans discussed with the patient, family and they are in agreement.  CODE STATUS: FC   TOTAL TIME TAKING CARE OF THIS PATIENT: 36 minutes.   POSSIBLE D/C IN  DAYS, DEPENDING ON CLINICAL CONDITION.  Note: This dictation was prepared with Dragon dictation along with smaller phrase technology. Any transcriptional errors that result from this process are unintentional.   Epifanio Lesches M.D on 01/25/2018 at 11:15 AM  Between 7am to 6pm - Pager - [phone] After 6pm go to [email or web address] - password [email or web address] [facility]  Office  [phone]  CC: Primary care physician; System, Pcp Not In

## 2018-01-25 NOTE — Evaluation (Signed)
Physical Therapy Evaluation Patient Details Name: Julia Shea MRN: [id number] DOB: [birth year] Today's Date: 01/25/2018   History of Present Illness  62 yo female with onset of LE weakness and sepsis with hypotension, UTI and dehydration with a chronic foley cath.  PMHx:  MRSA, asthma, DM, HTN,   Clinical Impression  Pt is up to side of bed but very weak and cannot stand up at all.  However, her body mechanics and effort are inefficient and may reflect inability to muster the effort or a lack of awareness of how to move to stand.  Very limited ability to try to slide up side of bed and so did not get far, used bedrails to help slide herself up.  Will hopefully be able to go to SNF for recovery of mobility before returning home to minimal help.  Follow acutely for same efforts.    Follow Up Recommendations SNF    Equipment Recommendations  None recommended by PT    Recommendations for Other Services       Precautions / Restrictions Precautions Precautions: Fall(chronic foley) Precaution Comments: frequently attempts to overlean on side of bed Restrictions Weight Bearing Restrictions: No      Mobility  Bed Mobility Overal bed mobility: Needs Assistance Bed Mobility: Supine to Sit;Sit to Supine     Supine to sit: Mod assist Sit to supine: Mod assist   General bed mobility comments: help to support trunk then to lift legs off bed and same to return to bed  Transfers Overall transfer level: Needs assistance Equipment used: Rolling walker (2 wheeled);1 person hand held assist Transfers: Sit to/from Stand Sit to Stand: Total assist         General transfer comment: pt could not stand up from side of bed but was using toes to stand despite cues  Ambulation/Gait             General Gait Details: unable  Stairs            Wheelchair Mobility    Modified Rankin (Stroke Patients Only)       Balance Overall balance assessment: Needs  assistance Sitting-balance support: Feet supported;Bilateral upper extremity supported Sitting balance-Leahy Scale: Fair                                       Pertinent Vitals/Pain Pain Assessment: No/denies pain    Home Living Family/patient expects to be discharged to:: Private residence Living Arrangements: Alone Available Help at Discharge: Available PRN/intermittently(nearby neighbor in the daytime) Type of Home: House Home Access: Stairs to enter Entrance Stairs-Rails: None Entrance Stairs-Number of Steps: 1 Home Layout: One level Home Equipment: Walker - 4 wheels;Wheelchair - [employer] - 2 wheels Additional Comments: Pt is a little confused and her information may not be correct    Prior Function Level of Independence: Needs assistance   Gait / Transfers Assistance Needed: pt is using a wc for mobility and not a functional walker  ADL's / Homemaking Assistance Needed: has neighbor to assist her        Hand Dominance   Dominant Hand: Right    Extremity/Trunk Assessment   Upper Extremity Assessment Upper Extremity Assessment: Generalized weakness    Lower Extremity Assessment Lower Extremity Assessment: Generalized weakness    Cervical / Trunk Assessment Cervical / Trunk Assessment: Normal  Communication   Communication: No difficulties  Cognition Arousal/Alertness: Awake/alert Behavior During Therapy: Impulsive;Agitated Overall  Cognitive Status: No family/caregiver present to determine baseline cognitive functioning                                 General Comments: pt is unsafe with attempts to lean forward on side of bed      General Comments      Exercises     Assessment/Plan    PT Assessment Patient needs continued PT services  PT Problem List Decreased strength;Decreased range of motion;Decreased activity tolerance;Decreased balance;Decreased mobility;Decreased coordination;Decreased knowledge of use of  DME;Decreased safety awareness;Decreased skin integrity       PT Treatment Interventions DME instruction;Functional mobility training;Therapeutic activities;Therapeutic exercise;Balance training;Neuromuscular re-education;Patient/family education    PT Goals (Current goals can be found in the Care Plan section)  Acute Rehab PT Goals Patient Stated Goal: to find therapy to get stronger PT Goal Formulation: With patient Time For Goal Achievement: 02/08/18 Potential to Achieve Goals: Fair    Frequency Min 2X/week   Barriers to discharge Inaccessible home environment;Decreased caregiver support home with a neighbor who must leave at times    Co-evaluation               AM-PAC PT "6 Clicks" Daily Activity  Outcome Measure Difficulty turning over in bed (including adjusting bedclothes, sheets and blankets)?: Unable Difficulty moving from lying on back to sitting on the side of the bed? : Unable Difficulty sitting down on and standing up from a chair with arms (e.g., wheelchair, bedside commode, etc,.)?: Unable Help needed moving to and from a bed to chair (including a wheelchair)?: A Lot Help needed walking in hospital room?: Total Help needed climbing 3-5 steps with a railing? : Total 6 Click Score: 7    End of Session Equipment Utilized During Treatment: Gait belt Activity Tolerance: Patient limited by lethargy;Patient limited by fatigue;Treatment limited secondary to medical complications (Comment) Patient left: in bed;with call bell/phone within reach;with bed alarm set Nurse Communication: Mobility status PT Visit Diagnosis: Unsteadiness on feet (R26.81);Muscle weakness (generalized) (M62.81);Adult, failure to thrive (R62.7)    Time: 4496-7591 PT Time Calculation (min) (ACUTE ONLY): 17 min   Charges:   PT Evaluation $PT Eval Moderate Complexity: 1 Mod     PT G Codes:   PT G-Codes **NOT FOR INPATIENT CLASS** Functional Assessment Tool Used: AM-PAC 6 Clicks Basic  Mobility    Ramond Dial 01/25/2018, 5:31 PM   Mee Hives, PT MS Acute Rehab Dept. Number: [place] [facility] [place]

## 2018-01-25 NOTE — Clinical Social Work Note (Signed)
CSW received consult that the patient needs assistance at home. The patient is already being followed by the [facility]. CSW is signing off. Please consult should additional needs arise.  Santiago Bumpers, MSW, Latanya Presser [phone]

## 2018-01-26 LAB — GLUCOSE, CAPILLARY
GLUCOSE-CAPILLARY: 79 mg/dL (ref 65–99)
Glucose-Capillary: 100 mg/dL — ABNORMAL HIGH (ref 65–99)
Glucose-Capillary: 107 mg/dL — ABNORMAL HIGH (ref 65–99)
Glucose-Capillary: 81 mg/dL (ref 65–99)

## 2018-01-26 LAB — CULTURE, BLOOD (ROUTINE X 2): SPECIAL REQUESTS: ADEQUATE

## 2018-01-26 MED ORDER — CIPROFLOXACIN HCL 500 MG PO TABS
500.0000 mg | ORAL_TABLET | Freq: Two times a day (BID) | ORAL | Status: DC
  Administered 2018-01-26 – 2018-01-28 (×6): 500 mg via ORAL
  Filled 2018-01-26 (×6): qty 1

## 2018-01-26 MED ORDER — LACTULOSE 10 GM/15ML PO SOLN
20.0000 g | Freq: Once | ORAL | Status: AC
  Administered 2018-01-26: 20 g via ORAL
  Filled 2018-01-26: qty 30

## 2018-01-26 MED ORDER — POLYETHYLENE GLYCOL 3350 17 G PO PACK
17.0000 g | PACK | Freq: Every day | ORAL | Status: DC
  Administered 2018-01-26 – 2018-01-28 (×3): 17 g via ORAL
  Filled 2018-01-26 (×3): qty 1

## 2018-01-26 NOTE — Progress Notes (Signed)
Patient has had several bowel movements after receiving lactulose x1, miralax and one soap suds enemas. Prior to enema, only small amounts of thick stool was able to be expelled. Post enema, larger amounts of softer stool was passed. Patient admits to feeling better after bowel movements.

## 2018-01-26 NOTE — Progress Notes (Signed)
Dr. Vianne Bulls was notified of complaint of constipation. Orders were given for miralax and lactulose. Will monitor for effectiveness once administered.

## 2018-01-26 NOTE — Progress Notes (Signed)
[facility] [place] NAME: Julia Shea    MR#:  [id number]  DATE OF BIRTH:  [birth year]  SUBJECTIVE: Constipation.  Requesting stoolsofteners,, enemas. No other complaints.  Cultures negative.  Physical therapy recommended SNF placement.  Patient wants to go to SNF.  CHIEF COMPLAINT:   REVIEW OF SYSTEMS:  CONSTITUTIONAL: No fever, fatigue or weakness.  EYES: No blurred or double vision.  EARS, NOSE, AND THROAT: No tinnitus or ear pain.  RESPIRATORY: No cough, shortness of breath, wheezing or hemoptysis.  CARDIOVASCULAR: No chest pain, orthopnea, edema.  GASTROINTESTINAL: No nausea, vomiting, diarrhea or abdominal pain.  GENITOURINARY: No dysuria, hematuria.  ENDOCRINE: No polyuria, nocturia,  HEMATOLOGY: No anemia, easy bruising or bleeding SKIN: No rash or lesion. MUSCULOSKELETAL: No joint pain or arthritis.   NEUROLOGIC: No tingling, numbness, weakness.  PSYCHIATRY: No anxiety or depression.   DRUG ALLERGIES:   Allergies  Allergen Reactions  . Penicillins     Has patient had a PCN reaction causing immediate rash, facial/tongue/throat swelling, SOB or lightheadedness with hypotension: Yes Has patient had a PCN reaction causing severe rash involving mucus membranes or skin necrosis: Yes Has patient had a PCN reaction that required hospitalization: Yes Has patient had a PCN reaction occurring within the last 10 years: Yes If all of the above answers are "NO", then may proceed with Cephalosporin use.  . Sulfa Antibiotics     VITALS:  Blood pressure (!) 154/95, pulse 86, temperature 98.4 F (36.9 C), temperature source Oral, resp. rate 17, height 5\' 2"  (1.575 m), weight 49.5 kg (109 lb 1.6 oz), SpO2 99 %.  PHYSICAL EXAMINATION:  GENERAL:  62 y.o.-year-old patient lying in the bed with no acute distress.  Patient appears malnourished EYES: Pupils equal, round, reactive to light and accommodation. No scleral icterus.  Extraocular muscles intact.  HEENT: Head atraumatic, normocephalic. Oropharynx and nasopharynx clear.  NECK:  Supple, no jugular venous distention. No thyroid enlargement, no tenderness.  LUNGS: Normal breath sounds bilaterally, no wheezing, rales,rhonchi or crepitation. No use of accessory muscles of respiration.  CARDIOVASCULAR: S1, S2 normal. No murmurs, rubs, or gallops.  ABDOMEN: Soft, nontender, nondistended. Bowel sounds present. No organomegaly or mass.  Slight left lower quadrant tenderness present EXTREMITIES: No pedal edema, cyanosis, or clubbing.  NEUROLOGIC: Cranial nerves II through XII are intact. Muscle strength 5/5 in all extremities. Sensation intact. Gait not checked.  PSYCHIATRIC: The patient is alert and oriented x 3.  SKIN: No obvious rash, lesion, or ulcer.    LABORATORY PANEL:   CBC Recent Labs  Lab 01/24/18 0509  WBC 10.1  HGB 11.1*  HCT 32.1*  PLT 288   ------------------------------------------------------------------------------------------------------------------  Chemistries  Recent Labs  Lab 01/24/18 0509  NA 137  K 3.3*  CL 107  CO2 23  GLUCOSE 109*  BUN 21*  CREATININE 0.59  CALCIUM 9.1  MG 1.5*   ------------------------------------------------------------------------------------------------------------------  Cardiac Enzymes No results for input(s): TROPONINI in the last 168 hours. ------------------------------------------------------------------------------------------------------------------  RADIOLOGY:  No results found.  EKG:   Orders placed or performed during the hospital encounter of 01/23/18  . ED EKG  . ED EKG  . EKG 12-Lead  . EKG 12-Lead    ASSESSMENT AND PLAN:   62 year old female patient with history of bladder dysfunction, chronic indwelling Foley catheter for type 2 diabetes mellitus, hypertension presented to the emergency room with low blood pressure, chills.  Sepsis secondary to urinary tract  infection IV fluids   on  IV cefepime, cultures no growth de-escalate antibiotics.  Patient received.  IV cefepime.  Hypotension.  Patient afebrile   bladder dysfunction Has indwelling Foley catheter Now has UTI, follow urine cultures, .  On contact isolation because of positive MRSA screen.  Diabetes mental type II Sliding scale insulin, continue home medication metformin and glimepiride  Essential hypertension continue clonidine,lisinopril   case manager is following [facility]ication [facility]. deCondition, physical therapy recommended SNF placement. All the records are reviewed and case discussed with Care Management/Social Workerr. Management plans discussed with the patient, family and they are in agreement.  CODE STATUS: FC   TOTAL TIME TAKING CARE OF THIS PATIENT: 36 minutes.   POSSIBLE D/C IN  DAYS, DEPENDING ON CLINICAL CONDITION.  Note: This dictation was prepared with Dragon dictation along with smaller phrase technology. Any transcriptional errors that result from this process are unintentional.   Epifanio Lesches M.D on 01/26/2018 at 11:27 AM  Between 7am to 6pm - Pager - [phone] After 6pm go to [email or web address] - password [email or web address] [facility]  Office  [phone]  CC: Primary care physician; System, Pcp Not In

## 2018-01-27 LAB — GLUCOSE, CAPILLARY
GLUCOSE-CAPILLARY: 107 mg/dL — AB (ref 65–99)
GLUCOSE-CAPILLARY: 97 mg/dL (ref 65–99)
Glucose-Capillary: 53 mg/dL — ABNORMAL LOW (ref 65–99)
Glucose-Capillary: 82 mg/dL (ref 65–99)
Glucose-Capillary: 89 mg/dL (ref 65–99)

## 2018-01-27 MED ORDER — CIPROFLOXACIN HCL 500 MG PO TABS: 500.0000 mg | ORAL_TABLET | Freq: Two times a day (BID) | ORAL | 0 refills | Status: DC
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
## 2018-01-27 NOTE — Care Management (Signed)
May want to go home instead of skilled nursing facility. If home, RN, PT and SW from [facility]. Goes to the [facility] RN MSN CCM Care Management [phone]

## 2018-01-27 NOTE — Progress Notes (Signed)
Inpatient Diabetes Program Recommendations  AACE/ADA: New Consensus Statement on Inpatient Glycemic Control (2015)  Target Ranges:  Prepandial:   less than 140 mg/dL      Peak postprandial:   less than 180 mg/dL (1-2 hours)      Critically ill patients:  140 - 180 mg/dL   Lab Results  Component Value Date   GLUCAP 89 01/27/2018   HGBA1C 8.4 (H) 12/19/2017    Review of Glycemic ControlResults for SANITA, ESTRADA (MRN [id number]) as of 01/27/2018 12:03  Ref. Range 01/26/2018 16:52 01/26/2018 22:07 01/27/2018 08:13 01/27/2018 08:46 01/27/2018 11:58  Glucose-Capillary Latest Ref Range: 65 - 99 mg/dL 79 81 53 (L) 82 89    Diabetes history: Type 2 DM  Outpatient Diabetes medications:  Amaryl 1 mg daily, Novolin 70/30 10 units bid with meals Current orders for Inpatient glycemic control:  Amaryl 1 mg daily, Novolog moderate tid with meals and HS  Inpatient Diabetes Program Recommendations:   Please hold Amaryl while patient is in the hospital and reduce Novolog correction to sensitive tid with meals. Text page sent to MD.   Thanks,  Adah Perl, RN, BC-ADM Inpatient Diabetes Coordinator Pager [phone] (8a-5p)

## 2018-01-27 NOTE — Progress Notes (Signed)
[facility] [place] NAME: Julia Shea    MR#:  [id number]  DATE OF BIRTH:  [birth year]  Constipation improved however now has hypoglycemia with blood sugar 53 this morning.  Patient denies any nausea or vomiting no diarrhea.  Eating little lesser than usual.  CHIEF COMPLAINT:   REVIEW OF SYSTEMS:  CONSTITUTIONAL: No fever, fatigue or weakness.  EYES: No blurred or double vision.  EARS, NOSE, AND THROAT: No tinnitus or ear pain.  RESPIRATORY: No cough, shortness of breath, wheezing or hemoptysis.  CARDIOVASCULAR: No chest pain, orthopnea, edema.  GASTROINTESTINAL: No nausea, vomiting, diarrhea or abdominal pain.  GENITOURINARY: No dysuria, hematuria.  ENDOCRINE: No polyuria, nocturia,  HEMATOLOGY: No anemia, easy bruising or bleeding SKIN: No rash or lesion. MUSCULOSKELETAL: No joint pain or arthritis.   NEUROLOGIC: No tingling, numbness, weakness.  PSYCHIATRY: No anxiety or depression.   DRUG ALLERGIES:   Allergies  Allergen Reactions  . Penicillins     Has patient had a PCN reaction causing immediate rash, facial/tongue/throat swelling, SOB or lightheadedness with hypotension: Yes Has patient had a PCN reaction causing severe rash involving mucus membranes or skin necrosis: Yes Has patient had a PCN reaction that required hospitalization: Yes Has patient had a PCN reaction occurring within the last 10 years: Yes If all of the above answers are "NO", then may proceed with Cephalosporin use.  . Sulfa Antibiotics     VITALS:  Blood pressure (!) 146/71, pulse 68, temperature 98.4 F (36.9 C), temperature source Oral, resp. rate 14, height 5\' 2"  (1.575 m), weight 49.5 kg (109 lb 1.6 oz), SpO2 100 %.  PHYSICAL EXAMINATION:  GENERAL:  62 y.o.-year-old patient lying in the bed with no acute distress.  Patient appears malnourished EYES: Pupils equal, round, reactive to light and accommodation. No scleral icterus. Extraocular  muscles intact.  HEENT: Head atraumatic, normocephalic. Oropharynx and nasopharynx clear.  NECK:  Supple, no jugular venous distention. No thyroid enlargement, no tenderness.  LUNGS: Normal breath sounds bilaterally, no wheezing, rales,rhonchi or crepitation. No use of accessory muscles of respiration.  CARDIOVASCULAR: S1, S2 normal. No murmurs, rubs, or gallops.  ABDOMEN: Soft, nontender, nondistended. Bowel sounds present. No organomegaly or mass.  Slight left lower quadrant tenderness present EXTREMITIES: No pedal edema, cyanosis, or clubbing.  NEUROLOGIC: Cranial nerves II through XII are intact. Muscle strength 5/5 in all extremities. Sensation intact. Gait not checked.  PSYCHIATRIC: The patient is alert and oriented x 3.  SKIN: No obvious rash, lesion, or ulcer.    LABORATORY PANEL:   CBC Recent Labs  Lab 01/24/18 0509  WBC 10.1  HGB 11.1*  HCT 32.1*  PLT 288   ------------------------------------------------------------------------------------------------------------------  Chemistries  Recent Labs  Lab 01/24/18 0509  NA 137  K 3.3*  CL 107  CO2 23  GLUCOSE 109*  BUN 21*  CREATININE 0.59  CALCIUM 9.1  MG 1.5*   ------------------------------------------------------------------------------------------------------------------  Cardiac Enzymes No results for input(s): TROPONINI in the last 168 hours. ------------------------------------------------------------------------------------------------------------------  RADIOLOGY:  No results found.  EKG:   Orders placed or performed during the hospital encounter of 01/23/18  . ED EKG  . ED EKG  . EKG 12-Lead  . EKG 12-Lead    ASSESSMENT AND PLAN:   62 year old female patient with history of bladder dysfunction, chronic indwelling Foley catheter for type 2 diabetes mellitus, hypertension presented to the emergency room with low blood pressure, chills.  Sepsis secondary to urinary tract infection IV fluids  on IV cefepime, cultures no growth de-escalate antibiotics.  Patient received.  IV cefepime.   Continue Cipro for 3 days.  bladder dysfunction Has indwelling Foley catheter Now has UTI, , .  On contact isolation because of positive MRSA screen.  Diabetes mental type II hypoglycemia today morning.  Hold Amaryl.;  Continue sliding scale with coverage. Essential hypertension continue clonidine,lisinopril    severe constipation, requested multiple stool softeners, enema yesterday. she had BM yesterday and now she feels much better today. social Tour manager. Management plans discussed with the patient, family and they are in agreement.  CODE STATUS: FC   TOTAL TIME TAKING CARE OF THIS PATIENT: 36 minutes.   POSSIBLE D/C IN  DAYS, DEPENDING ON CLINICAL CONDITION.  Note: This dictation was prepared with Dragon dictation along with smaller phrase technology. Any transcriptional errors that result from this process are unintentional.   Epifanio Lesches M.D on 01/27/2018 at 12:15 PM  Between 7am to 6pm - Pager - [phone] After 6pm go to [email or web address] - password [email or web address] [facility]  Office  [phone]  CC: Primary care physician; System, Pcp Not In

## 2018-01-28 LAB — CULTURE, BLOOD (ROUTINE X 2)
Culture: NO GROWTH
Special Requests: ADEQUATE

## 2018-01-28 LAB — GLUCOSE, CAPILLARY
GLUCOSE-CAPILLARY: 88 mg/dL (ref 65–99)
GLUCOSE-CAPILLARY: 99 mg/dL (ref 65–99)
Glucose-Capillary: 120 mg/dL — ABNORMAL HIGH (ref 65–99)

## 2018-01-28 MED ORDER — MAGNESIUM SULFATE 2 GM/50ML IV SOLN
2.0000 g | Freq: Once | INTRAVENOUS | Status: AC
  Administered 2018-01-28: 2 g via INTRAVENOUS
  Filled 2018-01-28: qty 50

## 2018-01-28 MED ORDER — TRAMADOL HCL 50 MG PO TABS: 50.0000 mg | ORAL_TABLET | Freq: Four times a day (QID) | ORAL | 0 refills | Status: DC | PRN

## 2018-01-28 MED ORDER — PREDNISONE 10 MG (21) PO TBPK: ORAL_TABLET | ORAL | 0 refills | Status: DC

## 2018-01-28 MED ORDER — PREDNISONE 20 MG PO TABS: 20.0000 mg | ORAL_TABLET | Freq: Every day | ORAL | Status: DC

## 2018-01-28 MED ORDER — MELATONIN 5 MG PO TABS
10.0000 mg | ORAL_TABLET | Freq: Every evening | ORAL | Status: DC | PRN
  Administered 2018-01-28: 10 mg via ORAL
  Filled 2018-01-28 (×2): qty 2

## 2018-01-28 NOTE — Clinical Social Work Note (Addendum)
CSW met with patient and discussed SNF placement options.  CSW explained to patient, because she does not have any insurance, it is going to be difficult to find a facility that will accept her.  CSW explained to patient, that [place] the [place] and [place] will have to let her once a facility is found.  CSW expressed to her what to expect at SNF and what the process is for looking for placement.  Patient expressed understanding, and is in agreement to having CSW begin bed search.  CSW inquired if patient has applied for disability or Medicaid and she said yes her reference number is [id number].  CSW informed her that if she does go to a SNF, she will most likely have to stay there for 30 days and she is agreeable.  Patient did not express any other questions or concerns about going to SNF.  CSW also informed her that if she does not get any bed offers she will have to look at going home with home health.  Patient's expressed understanding.   Jones Broom. [place], MSW, [place]  01/28/2018 8:48 AM

## 2018-01-28 NOTE — Progress Notes (Signed)
Physical Therapy  Patient suffers from PT diagnosis of "other abnormalities of gait and mobility" which impairs her ability to perform daily activities like toileting, dressing, and bathing in the home. A cane, walker, or crutch will not resolve the patient's issue with performing activities of daily living. A wheelchair is required/recommended and will allow patient to safely perform daily activities.   Patient reports having a caregiver who can provide assistance.  Leitha Bleak, PT 01/28/18, 3:36 PM [phone]

## 2018-01-28 NOTE — Progress Notes (Signed)
Verbal order from prime doctor to place discharge order

## 2018-01-28 NOTE — Progress Notes (Signed)
Physical Therapy Treatment Patient Details Name: Julia Shea MRN: [id number] DOB: 03/20/1956 Today's Date: 01/28/2018    History of Present Illness 62 yo female with onset of LE weakness and sepsis with hypotension, UTI and dehydration with a chronic foley cath.  PMHx:  MRSA, asthma, DM, HTN,     PT Comments    Pt reporting that she started falling in November 2018 and stopped walking in January 2019 d/t falls; pt also reports falls d/t L hip giving out (pt reports having imaging done and showed arthritis but had follow-up with orthopedic MD scheduled for tomorrow).  Pt reports that since January she has required 1 assist with bed mobility and 1-2 assist with transfers (pt reports she does not have a w/c at home but instead uses seat of 4ww; pt also reports she does not use RW to transfer but holds onto person to perform transfer).  Pt also with chronic foley.  Currently pt reports she lives alone and does not get OOB unless she has assist d/t h/o falls and that her neighbor helps her a lot (gets more assist as needed when going to appts).  Pt reports no change in functioning (no increase or decrease) since January 2019.  Pt reporting chronic L LE and back pain which complicates pt's mobility as well (pt reporting 10/10 L LE pain during session but pt declined further pain intervention d/t already reporting having a plan with nursing).  Stand pivot transfer chair to bed max assist x1 during session.  Pt appearing concerned with ability to manage at home and reports being motivated to go to rehab to improve functional mobility with goal of going to long term care.  Overall pt would benefit from STR to improve functional independence with bed mobility and w/c level transfers.   Follow Up Recommendations  SNF     Equipment Recommendations  Wheelchair (measurements PT);Wheelchair cushion (measurements PT)    Recommendations for Other Services       Precautions / Restrictions  Precautions Precautions: Fall Precaution Comments: Chronic foley Restrictions Weight Bearing Restrictions: No    Mobility  Bed Mobility Overal bed mobility: Needs Assistance Bed Mobility: Sit to Supine       Sit to supine: Min assist;HOB elevated   General bed mobility comments: assist for LE's sit to supine  Transfers Overall transfer level: Needs assistance Equipment used: None Transfers: Stand Pivot Transfers Sit to Stand: Max assist         General transfer comment: stand pivot recliner to bed (pt held onto therapist because this is what she normally does at home; pt declined walker use)  Ambulation/Gait             General Gait Details: Deferred (pt reports being non-ambulatory since January 2019)   Stairs             Wheelchair Mobility    Modified Rankin (Stroke Patients Only)       Balance Overall balance assessment: Needs assistance Sitting-balance support: Feet supported;No upper extremity supported Sitting balance-Leahy Scale: Good Sitting balance - Comments: steady sitting reaching within BOS                                    Cognition Arousal/Alertness: Awake/alert Behavior During Therapy: WFL for tasks assessed/performed Overall Cognitive Status: Within Functional Limits for tasks assessed  Exercises      General Comments General comments (skin integrity, edema, etc.): Pt sleeping in chair upon PT arrival; woken via vc's.  Nursing cleared pt for participation in physical therapy.  Pt agreeable to PT session.  Pt's neighbor present during session.      Pertinent Vitals/Pain Pain Assessment: 0-10 Pain Score: 10-Worst pain ever Pain Location: L LE (chronic) Pain Descriptors / Indicators: Aching Pain Intervention(s): Limited activity within patient's tolerance;Monitored during session;Premedicated before session;Repositioned(Pt reports plan for steroids to  assist with pain and declined further intervention)  Vitals (HR and O2 on room air) stable and WFL throughout treatment session.    Home Living                      Prior Function            PT Goals (current goals can now be found in the care plan section) Acute Rehab PT Goals Patient Stated Goal: to get stronger PT Goal Formulation: With patient Time For Goal Achievement: 02/08/18 Potential to Achieve Goals: Fair Progress towards PT goals: Progressing toward goals    Frequency    Min 2X/week      PT Plan Current plan remains appropriate    Co-evaluation              AM-PAC PT "6 Clicks" Daily Activity  Outcome Measure  Difficulty turning over in bed (including adjusting bedclothes, sheets and blankets)?: Unable Difficulty moving from lying on back to sitting on the side of the bed? : Unable Difficulty sitting down on and standing up from a chair with arms (e.g., wheelchair, bedside commode, etc,.)?: Unable Help needed moving to and from a bed to chair (including a wheelchair)?: A Lot Help needed walking in hospital room?: Total Help needed climbing 3-5 steps with a railing? : Total 6 Click Score: 7    End of Session Equipment Utilized During Treatment: Gait belt Activity Tolerance: Patient limited by pain Patient left: in bed;with call bell/phone within reach;with bed alarm set;with family/visitor present Nurse Communication: Mobility status;Precautions PT Visit Diagnosis: Unsteadiness on feet (R26.81);Muscle weakness (generalized) (M62.81);Adult, failure to thrive (R62.7)     Time: 4536-4680 PT Time Calculation (min) (ACUTE ONLY): 23 min  Charges:  $Therapeutic Activity: 23-37 mins                    G CodesLeitha Bleak, PT 01/28/18, 3:08 PM [phone]

## 2018-01-28 NOTE — Discharge Summary (Signed)
Julia Shea, is a 62 y.o. female  DOB [birth year]  MRN [id number].  Admission date:  01/23/2018  Admitting Physician  Saundra Shelling, MD  Discharge Date:  01/28/2018   Primary MD  System, Pcp Not In  Recommendations for primary care physician for things to follow:   [facility]   Admission Diagnosis  Sepsis, due to unspecified organism [facility]) [A41.9] Urinary tract infection without hematuria, site unspecified [N39.0]   Discharge Diagnosis  Sepsis, due to unspecified organism ([place]) [A41.9] Urinary tract infection without hematuria, site unspecified [N39.0]    Active Problems:   Sepsis ([place])   Malnutrition of moderate degree      Past Medical History:  Diagnosis Date  . Asthma   . 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 Chronic indwelling Foley catheter   . Depression   . Diabetes mellitus   . Hypertension   . MRSA infection     Past Surgical History:  Procedure Laterality Date  . LAPAROSCOPIC OOPHORECTOMY     removal of cyst       History of present illness and  Hospital Course:     Kindly see H&P for history of present illness and admission details, please review complete Labs, Consult reports and Test reports for all details in brief  HPI  from the history and physical done on the day of admission [name] who lives alone in an apartment came in because of falls, patient has chronic indwelling fat catheter because of bladder dysfunction.  She came in because of chills, found to have low BP admitted for possible UTI.   Hospital Course  Nausea, generalized weakness with chills: Patient was admitted for UTI because of abnormal UA on admission.  Received IV cefepime, IV fluids,  patient has chronic indwelling Foley catheter, Foley exchange in the emergency room.  Urine cultures negative without any growth.  Patient has a history of MRSA, ESBL so we continued contact isolation.  Patient did have hypotension when she came with IV fluids. 2.  Chronic indwelling Foley catheter because of bladder dysfunction: Patient brought in by EMS because of catheter change.  EMS found that patient unable to stand or walk since January, hospital very dirty with traction and 6 cats walking around small area.  Patient is unsafe to go back home and live alone because of her multiple falls, no safe discharge if she goes home.  High risk for recurrent admission.  Case manager is trying to see if she can be placed. 3.sepsis;ruled out.  Cystitis without urinary tract infection: Received cefepime.  Started on Cipro.yesterday.continue for 3 days and stop. 4.  Diabetes mellitus type 2: Hypoglycemia yesterday, we held her Amaryl yesterday, continue metformin, #5 non-severe malnutrition: Seen by registered dietitian, started on [employer], Magic cups.  Discharge Condition: Stable  Blood cultures, urine cultures did not show any growth. Follow UP      Discharge Instructions  and  Discharge Medications      Allergies as of 01/28/2018      Reactions   Penicillins    Has patient had a PCN reaction causing immediate rash, facial/tongue/throat swelling, SOB or lightheadedness with hypotension: Yes Has patient had a PCN reaction causing severe rash involving mucus membranes or skin necrosis: Yes Has patient had a PCN reaction that required hospitalization: Yes Has patient had a PCN reaction occurring within the last 10 years:  Yes If all of the above answers are "NO", then may proceed with Cephalosporin use.   Sulfa Antibiotics       Medication List    STOP taking these medications   glimepiride 1 MG tablet Commonly known as:  AMARYL     TAKE these medications    ciprofloxacin 500 MG tablet Commonly known as:  CIPRO Take 1 tablet (500 mg total) by mouth 2 (two) times daily.   cloNIDine 0.1 MG tablet Commonly known as:  CATAPRES Take 1 tablet (0.1 mg total) by mouth 2 (two) times daily.   insulin NPH-regular Human (70-30) 100 UNIT/ML injection Commonly known as:  NOVOLIN 70/30 Inject 10 Units into the skin 2 (two) times daily with a meal.   Insulin Pen Needle 32G X 6 MM Misc Commonly known as:  NOVOFINE 1 Units by Does not apply route as needed.   Insulin Syringes (Disposable) U-100 0.3 ML Misc 1 Syringe by Does not apply route 2 (two) times daily.   lisinopril 20 MG tablet Commonly known as:  PRINIVIL,ZESTRIL Take 1 tablet (20 mg total) by mouth daily.   metFORMIN 500 MG tablet Commonly known as:  GLUCOPHAGE Take 500 mg by mouth 2 (two) times daily.   multivitamin with minerals tablet Take 1 tablet by mouth daily.   pantoprazole 40 MG tablet Commonly known as:  PROTONIX Take 1 tablet (40 mg total) by mouth daily.   predniSONE 10 MG (21) Tbpk tablet Commonly known as:  STERAPRED UNI-PAK 21 TAB Taper by 10 mg daily   traMADol 50 MG tablet Commonly known as:  ULTRAM Take 1 tablet (50 mg total) by mouth every 6 (six) hours as needed for moderate pain.         Diet and Activity recommendation: See Discharge Instructions above   Consults obtained -physical therapy, case manager   Major procedures and Radiology Reports - PLEASE review detailed and final reports for all details, in brief -      Dg Chest Port 1 View  Result Date: 01/23/2018 CLINICAL DATA:  Hypotension and weakness EXAM: PORTABLE CHEST 1 VIEW COMPARISON:  None. FINDINGS: The heart size and mediastinal contours are within normal limits. Both lungs are clear. The visualized skeletal structures are unremarkable. IMPRESSION: No active disease. Electronically Signed   By: Ulyses Jarred M.D.   On: 01/23/2018 17:27    Micro Results    Recent Results (from  the past 240 hour(s))  Blood Culture (routine x 2)     Status: Abnormal   Collection Time: 01/23/18  5:13 PM  Result Value Ref Range Status   Specimen Description   Final    BLOOD RIGHT ANTECUBITAL Performed at [facility], [address] [place], [place] [address]    Special Requests   Final    BOTTLES DRAWN AEROBIC AND ANAEROBIC Blood Culture adequate volume Performed at [facility], [address] [place], [place] [address]    Culture  Setup Time   Final    GRAM POSITIVE COCCI ANAEROBIC BOTTLE ONLY CRITICAL RESULT CALLED TO, READ BACK BY AND VERIFIED WITH: [place] AT 7989 01/24/18 SDR    Culture (A)  Final    STAPHYLOCOCCUS SPECIES (COAGULASE NEGATIVE) THE SIGNIFICANCE OF ISOLATING THIS ORGANISM FROM A SINGLE SET OF BLOOD CULTURES WHEN MULTIPLE SETS ARE DRAWN IS UNCERTAIN. PLEASE NOTIFY THE MICROBIOLOGY DEPARTMENT WITHIN ONE WEEK IF SPECIATION AND SENSITIVITIES ARE REQUIRED. Performed at [facility], [place] [address] [place], [place] [address]  Report Status 01/26/2018 FINAL  Final  Blood Culture ID Panel (Reflexed)     Status: Abnormal   Collection Time: 01/23/18  5:13 PM  Result Value Ref Range Status   Enterococcus species NOT DETECTED NOT DETECTED Final   Listeria monocytogenes NOT DETECTED NOT DETECTED Final   Staphylococcus species DETECTED (A) NOT DETECTED Final    Comment: Methicillin (oxacillin) resistant coagulase negative staphylococcus. Possible blood culture contaminant (unless isolated from more than one blood culture draw or clinical case suggests pathogenicity). No antibiotic treatment is indicated for blood  culture contaminants. CRITICAL RESULT CALLED TO, READ BACK BY AND VERIFIED WITH: NATE COOKSON AT 1017 01/24/18 SDR    Staphylococcus aureus NOT DETECTED NOT DETECTED Final   Methicillin resistance DETECTED (A) NOT DETECTED Final    Comment: CRITICAL RESULT CALLED TO, READ BACK BY AND VERIFIED WITH:  NATE COOKSON AT 5102  01/24/18 SDR    Streptococcus species NOT DETECTED NOT DETECTED Final   Streptococcus agalactiae NOT DETECTED NOT DETECTED Final   Streptococcus pneumoniae NOT DETECTED NOT DETECTED Final   Streptococcus pyogenes NOT DETECTED NOT DETECTED Final   Acinetobacter baumannii NOT DETECTED NOT DETECTED Final   Enterobacteriaceae species NOT DETECTED NOT DETECTED Final   Enterobacter cloacae complex NOT DETECTED NOT DETECTED Final   Escherichia coli NOT DETECTED NOT DETECTED Final   Klebsiella oxytoca NOT DETECTED NOT DETECTED Final   Klebsiella pneumoniae NOT DETECTED NOT DETECTED Final   Proteus species NOT DETECTED NOT DETECTED Final   Serratia marcescens NOT DETECTED NOT DETECTED Final   Haemophilus influenzae NOT DETECTED NOT DETECTED Final   Neisseria meningitidis NOT DETECTED NOT DETECTED Final   Pseudomonas aeruginosa NOT DETECTED NOT DETECTED Final   Candida albicans NOT DETECTED NOT DETECTED Final   Candida glabrata NOT DETECTED NOT DETECTED Final   Candida krusei NOT DETECTED NOT DETECTED Final   Candida parapsilosis NOT DETECTED NOT DETECTED Final   Candida tropicalis NOT DETECTED NOT DETECTED Final    Comment: Performed at [facility], [address] [place], [place] [address]  Blood Culture (routine x 2)     Status: None   Collection Time: 01/23/18  5:14 PM  Result Value Ref Range Status   Specimen Description BLOOD LEFT ANTECUBITAL  Final   Special Requests   Final    BOTTLES DRAWN AEROBIC AND ANAEROBIC Blood Culture adequate volume   Culture   Final    NO GROWTH 5 DAYS Performed at [facility], [address] [place], [place] [address]    Report Status 01/28/2018 FINAL  Final  MRSA PCR Screening     Status: Abnormal   Collection Time: 01/23/18  9:20 PM  Result Value Ref Range Status   MRSA by PCR POSITIVE (A) NEGATIVE Final    Comment:        The GeneXpert MRSA Assay (FDA approved for NASAL specimens only), is one component of a comprehensive  MRSA colonization surveillance program. It is not intended to diagnose MRSA infection nor to guide or monitor treatment for MRSA infections. RESULT CALLED TO, READ BACK BY AND VERIFIED WITH: STEVEN SYKES @ 0023 ON 01/24/2018 BY CAF Performed at [facility], [address], [place], [place] [address]   Urine Culture     Status: None   Collection Time: 01/24/18 11:57 AM  Result Value Ref Range Status   Specimen Description   Final    URINE, RANDOM Performed at [facility], [address], [place], [place] [address]  Special Requests   Final    NONE Performed at [facility], [address], [place], [place] [address]    Culture   Final    NO GROWTH Performed at [facility], [place] [address], [place], [place] [address]    Report Status 01/25/2018 FINAL  Final       Today   Subjective:   Mateo Flow for discharge. Objective:   Blood pressure 134/86, pulse 83, temperature 97.7 F (36.5 C), temperature source Oral, resp. rate 18, height 5\' 2"  (1.575 m), weight 49.5 kg (109 lb 1.6 oz), SpO2 100 %.   Intake/Output Summary (Last 24 hours) at 01/28/2018 1204 Last data filed at 01/28/2018 0504 Gross per 24 hour  Intake -  Output 875 ml  Net -875 ml    Exam Awake Alert, Oriented x 3, No new changes. [place].AT,PERRAL Supple Neck,No JVD, No cervical lymphadenopathy appriciated.  Symmetrical Chest wall movement, Good air movement bilaterally, CTAB RRR,No Gallops,Rubs or new Murmurs, No Parasternal Heave +ve B.Sounds, Abd Soft, Non tender, No organomegaly appriciated, No rebound -guarding or rigidity. No Cyanosis, Clubbing or edema, No new Rash or bruise  Data Review   CBC w Diff:  Lab Results  Component Value Date   WBC 10.1 01/24/2018   HGB 11.1 (L) 01/24/2018   HGB 12.0 12/19/2017   HCT 32.1 (L) 01/24/2018   HCT 36.7 12/19/2017   PLT 288 01/24/2018   PLT 361 12/19/2017   LYMPHOPCT 33 12/21/2017   MONOPCT 6 12/21/2017   EOSPCT  5 12/21/2017   BASOPCT 1 12/21/2017    CMP:  Lab Results  Component Value Date   NA 137 01/24/2018   NA 138 12/19/2017   K 3.3 (L) 01/24/2018   CL 107 01/24/2018   CO2 23 01/24/2018   BUN 21 (H) 01/24/2018   BUN 35 (H) 12/19/2017   CREATININE 0.59 01/24/2018   PROT 7.6 12/21/2017   PROT 7.0 12/19/2017   ALBUMIN 3.6 12/21/2017   ALBUMIN 4.1 12/19/2017   BILITOT 0.5 12/21/2017   BILITOT 0.4 12/19/2017   ALKPHOS 474 (H) 12/21/2017   AST 35 12/21/2017   ALT 81 (H) 12/21/2017  .   Total Time in preparing paper work, data evaluation and todays exam - 35 minutes  Epifanio Lesches M.D on 01/28/2018 at 12:04 PM    Note: This dictation was prepared with Dragon dictation along with smaller phrase technology. Any transcriptional errors that result from this process are unintentional.

## 2018-01-28 NOTE — NC FL2 (Signed)
Alpha LEVEL OF CARE SCREENING TOOL     IDENTIFICATION  Patient Name: Julia Shea Birthdate: 03-05-1956 Sex: female Admission Date (Current Location): 01/23/2018  [place] and [place] Number:  Engineering geologist and Address:  [facility], [address], [place], [place] [address]      Provider Number: [id number]  Attending Physician Name and Address:  Epifanio Lesches, MD  Relative Name and Phone Number:  Stana Bunting   [phone]     Current Level of Care: Hospital Recommended Level of Care: [place] Prior Approval Number:    Date Approved/Denied:   PASRR Number: [id number]  Discharge Plan: SNF    Current Diagnoses: Patient Active Problem List   Diagnosis Date Noted  . Malnutrition of moderate degree 01/24/2018  . Pressure injury of skin 12/22/2017  . Sepsis ([place]) 12/21/2017  . Hepatic steatosis 05/15/2013  . Abdominal pain, right upper quadrant 05/07/2013  . Breast cancer screening 11/14/2011  . Hypertriglyceridemia 10/30/2011  . Depression 10/26/2011  . Diabetes mellitus type 2, uncontrolled ([place]) 10/26/2011  . Hypertension 10/26/2011    Orientation RESPIRATION BLADDER Height & Weight     Self, Time, Situation, Place  Normal Continent Weight: 109 lb 1.6 oz (49.5 kg) Height:  5\' 2"  (157.5 cm)  BEHAVIORAL SYMPTOMS/MOOD NEUROLOGICAL BOWEL NUTRITION STATUS      Incontinent Diet(Carb modified diet)  AMBULATORY STATUS COMMUNICATION OF NEEDS Skin   Limited Assist Verbally Normal                       Personal Care Assistance Level of Assistance  Bathing, Feeding, Dressing Bathing Assistance: Limited assistance Feeding assistance: Independent Dressing Assistance: Limited assistance     Functional Limitations Info  Sight, Hearing, Speech Sight Info: Adequate Hearing Info: Adequate Speech Info: Adequate    SPECIAL CARE FACTORS FREQUENCY  PT (By licensed PT)     PT  Frequency: 5x a week              Contractures Contractures Info: Not present    Additional Factors Info  Code Status, Allergies, Insulin Sliding Scale, Isolation Precautions Code Status Info: Full Code Allergies Info: PENICILLINS, SULFA ANTIBIOTICS    Insulin Sliding Scale Info: insulin aspart (novoLOG) injection 0-15 Units 3x a day with meals Isolation Precautions Info: Contact for MRSA     Current Medications (01/28/2018):  This is the current hospital active medication list Current Facility-Administered Medications  Medication Dose Route Frequency Provider Last Rate Last Dose  . acetaminophen (TYLENOL) tablet 650 mg  650 mg Oral Q6H PRN Saundra Shelling, MD   650 mg at 01/28/18 0504   Or  . acetaminophen (TYLENOL) suppository 650 mg  650 mg Rectal Q6H PRN Pyreddy, Reatha Harps, MD      . ciprofloxacin (CIPRO) tablet 500 mg  500 mg Oral BID Epifanio Lesches, MD   500 mg at 01/28/18 0730  . cloNIDine (CATAPRES) tablet 0.1 mg  0.1 mg Oral BID Saundra Shelling, MD   0.1 mg at 01/28/18 0011  . enoxaparin (LOVENOX) injection 40 mg  40 mg Subcutaneous Q24H Saundra Shelling, MD   40 mg at 01/28/18 0011  . insulin aspart (novoLOG) injection 0-15 Units  0-15 Units Subcutaneous TID WC Saundra Shelling, MD   3 Units at 01/24/18 1227  . insulin aspart (novoLOG) injection 0-5 Units  0-5 Units Subcutaneous QHS Pyreddy, Pavan, MD      . lisinopril (PRINIVIL,ZESTRIL) tablet 20 mg  20 mg Oral Daily Pyreddy,  Pavan, MD   20 mg at 01/27/18 1022  . Melatonin TABS 10 mg  10 mg Oral QHS PRN Amelia Jo, MD   10 mg at 01/28/18 4818  . metFORMIN (GLUCOPHAGE) tablet 500 mg  500 mg Oral BID WC Pyreddy, Reatha Harps, MD   500 mg at 01/28/18 0730  . multivitamin with minerals tablet 1 tablet  1 tablet Oral Daily Pyreddy, Reatha Harps, MD   1 tablet at 01/27/18 1022  . mupirocin ointment (BACTROBAN) 2 % 1 application  1 application Nasal BID Saundra Shelling, MD   1 application at 56/31/49 0012  . ondansetron (ZOFRAN) tablet 4 mg  4  mg Oral Q6H PRN Saundra Shelling, MD       Or  . ondansetron (ZOFRAN) injection 4 mg  4 mg Intravenous Q6H PRN Saundra Shelling, MD   4 mg at 01/28/18 0459  . pantoprazole (PROTONIX) EC tablet 40 mg  40 mg Oral Daily Pyreddy, Reatha Harps, MD   40 mg at 01/27/18 1022  . polyethylene glycol (MIRALAX / GLYCOLAX) packet 17 g  17 g Oral Daily Epifanio Lesches, MD   17 g at 01/27/18 1022  . senna-docusate (Senokot-S) tablet 1 tablet  1 tablet Oral QHS PRN Pyreddy, Reatha Harps, MD      . traMADol (ULTRAM) tablet 50 mg  50 mg Oral Q6H PRN Arta Silence, MD   50 mg at 01/28/18 7026     Discharge Medications: Please see discharge summary for a list of discharge medications.  Relevant Imaging Results:  Relevant Lab Results:   Additional Information SSN [id number]  Ross Ludwig, [place]

## 2018-01-28 NOTE — Clinical Social Work Note (Signed)
Clinical Social Work Assessment  Patient Details  Name: Julia Shea MRN: [id number] Date of Birth: May 11, 1956  Date of referral:                  Reason for consult:                   Permission sought to share information with:    Permission granted to share information::     Name::        Agency::     Relationship::     Contact Information:     Housing/Transportation Living arrangements for the past 2 months:    Source of Information:    Patient Interpreter Needed:    Criminal Activity/Legal Involvement Pertinent to Current Situation/Hospitalization:    Significant Relationships:    Lives with:    Do you feel safe going back to the place where you live?    Need for family participation in patient care:     Care giving concerns:  Patient feels she needs some short term rehab before she returns home.   Social Worker assessment / plan:  Patient is a 62 year old female who is alert and oriented x4.  Patient lives alone in an apartment, patient states she has not been to rehab before.  CSW met with patient and discussed SNF placement options.  CSW explained to patient, because she does not have any insurance, it is going to be difficult to find a facility that will accept her.  CSW explained to patient, that it will most likely be out of the county and [place] will have to let her once a facility is found.  CSW expressed to her what to expect at SNF and what the process is for looking for placement.  Patient expressed understanding, and is in agreement to having CSW begin bed search.  CSW inquired if patient has applied for disability or Medicaid and she said yes her reference number is [id number].  CSW informed her that if she does go to a SNF, she will most likely have to stay there for 30 days and she is agreeable.  CSW informed her if there are not SNF options, she will have to consider going home with home health or going to a family member's house where someone is available to help her.     Patient did not express any other questions or concerns about going to SNF.   Employment status:    Insurance information:    PT Recommendations:    Information / Referral to community resources:     Patient/Family's Response to care: Patient is in agreement to going to SNF and will need short term rehab.  Patient/Family's Understanding of and Emotional Response to Diagnosis, Current Treatment, and Prognosis:  Patient is aware of current prognosis, patient expresses understanding that she needs some extra help right now, and she may have to go out of county.  Patient is very positive and motivated to work hard with therapy.  Emotional Assessment Appearance:    Attitude/Demeanor/Rapport:    Affect (typically observed):    Orientation:    Alcohol / Substance use:    Psych involvement (Current and /or in the community):     Discharge Needs  Concerns to be addressed:    Readmission within the last 30 days:    Current discharge risk:    Barriers to Discharge:      Anell Barr 01/28/2018, 8:48 AM

## 2018-01-28 NOTE — Care Management Note (Signed)
Case Management Note  Patient Details  Name: Julia Shea MRN: [id number] Date of Birth: 07-10-1956  Patient to discharge home today. Patient and sister in law [name] are both aware that patient will be discharging home with home health services.  Patient is aware that she needs to file for Medicaid.  Patient is aware that she needs to provide Medication Management  With financial information they have requested.  Additional copy of Medication Management  And [name] provided to patient.  Patient provided with coupons for medication from [facility].  Patient states that she will be able to pick them up tomorrow.  [facility] to provide charity services.  Per previous RNCM Dr Doy Hutching to sign patients home health orders until she can get to her PCP appointment on 5/9.  Patient is aware that if she does not follow up with her PCP services will be discontinued.  Patient is going to use [name] to get to her appointments.  [name] with [facility] notified of discharge.  EMS forms placed on chart.  Subjective/Objective:                    Action/Plan:   Expected Discharge Date:                  Expected Discharge Plan:  [name]  In-House Referral:     Discharge planning Services  CM Consult, Medication Assistance, [facility]  Post Acute Care Choice:    Choice offered to:  Patient  DME Arranged:  Wheelchair manual DME Agency:  [name]:  RN, PT, OT, Nurse's Aide, Social Work [employer] Agency:  [name]  Status of Service:  Completed, signed off  If discussed at [name], dates discussed:    Additional Comments:  [name], RN 01/28/2018, 5:25 PM

## 2018-01-28 NOTE — Progress Notes (Signed)
Patient discharge teaching given, including activity, diet, follow-up appoints, and medications. Patient verbalized understanding of all discharge instructions. IV access was d/c'd. Vitals are stable. Skin is intact except as charted in most recent assessments. Pt to be escorted out by transportation.   Dyanna Seiter [employer]

## 2018-01-28 NOTE — Clinical Social Work Note (Signed)
MD has discharge summary in place today. Patient at baseline is unable to ambulate and has been having someone live with her taking care of her. Patient has been this way since January of this year. Although patient might benefit from STR, patient does not have insurance. RN CM is working on making arrangements for patient to return home. Shela Leff MSW,LCSW [phone]

## 2018-01-28 NOTE — Clinical Social Work Note (Signed)
CSW was asked by RN CM to speak with patient's sister in law: [name]: [phone]. CSW contacted [name] who stated that she was an Systems developer at a facility and that she knew that patient could be discharged to a facility even though she had no insurance. [name] stated that she knew that medicaid could retro pay. CSW advised [name] that patient currently has no insurance and has no pending medicaid and that facilities are not willing to take the risk of taking a patient basing it on the assumption that they will be approved for medicaid. CSW advised patient could private pay to go to a facility and that patient had a bed offer from [facility] [place]. CSW advised patient would have to pay out of pocket to go there. [name] was initially mad with CSW but as the conversation continued, [name] stated that patient has not followed through with anything she has needed to do for herself. She stated that she and her husband took patient in last November and patient would not take the initiative to do anything for herself. [name] stated that patient was told she needed to take the initiative to help herself and after that, [name] stated patient refused to stay with them and moved back to her apartment. [name] confirmed patient has not ambulated since November but been back in her home since January. [name] confirmed patient has someone that lives with her 24/7. [name] confirms also that patient never applied for medicaid, never followed through with initiating a visit to a primary care physician. [name] states that this is a pattern with her sister in law. CSW provided supportive listening and counseling to normalize her feelings surrounding her sister in law. [name] expressed appreciation for CSW time and stated that her frustration lies within having to try and help someone who doesn't want to help themselves. 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 CSW provided [name] with patient's new physician appointment so she could assist patient with follow through.

## 2018-01-29 ENCOUNTER — Ambulatory Visit: Payer: Self-pay | Admitting: Specialist

## 2018-01-30 ENCOUNTER — Ambulatory Visit: Payer: Self-pay

## 2018-02-06 ENCOUNTER — Encounter: Payer: Self-pay | Admitting: Adult Health

## 2018-02-06 ENCOUNTER — Ambulatory Visit: Payer: Self-pay | Admitting: Adult Health

## 2018-02-06 VITALS — BP 101/69 | HR 104 | Temp 97.9°F | Ht 62.0 in | Wt 109.0 lb

## 2018-02-06 DIAGNOSIS — Z9641 Presence of insulin pump (external) (internal): Secondary | ICD-10-CM

## 2018-02-06 DIAGNOSIS — Z978 Presence of other specified devices: Secondary | ICD-10-CM | POA: Insufficient documentation

## 2018-02-06 DIAGNOSIS — N329 Bladder disorder, unspecified: Secondary | ICD-10-CM

## 2018-02-06 DIAGNOSIS — M12812 Other specific arthropathies, not elsewhere classified, left shoulder: Secondary | ICD-10-CM | POA: Insufficient documentation

## 2018-02-06 DIAGNOSIS — R112 Nausea with vomiting, unspecified: Secondary | ICD-10-CM

## 2018-02-06 DIAGNOSIS — F339 Major depressive disorder, recurrent, unspecified: Secondary | ICD-10-CM

## 2018-02-06 DIAGNOSIS — Z960 Presence of urogenital implants: Secondary | ICD-10-CM

## 2018-02-06 DIAGNOSIS — M75102 Unspecified rotator cuff tear or rupture of left shoulder, not specified as traumatic: Secondary | ICD-10-CM

## 2018-02-06 DIAGNOSIS — K219 Gastro-esophageal reflux disease without esophagitis: Secondary | ICD-10-CM

## 2018-02-06 DIAGNOSIS — M160 Bilateral primary osteoarthritis of hip: Secondary | ICD-10-CM

## 2018-02-06 DIAGNOSIS — E118 Type 2 diabetes mellitus with unspecified complications: Secondary | ICD-10-CM

## 2018-02-06 DIAGNOSIS — E119 Type 2 diabetes mellitus without complications: Secondary | ICD-10-CM

## 2018-02-06 MED ORDER — POTASSIUM CHLORIDE ER 10 MEQ PO TBCR
10.0000 meq | EXTENDED_RELEASE_TABLET | Freq: Every day | ORAL | 3 refills | Status: DC
Start: 2018-02-06 — End: 2018-08-13

## 2018-02-06 MED ORDER — LISINOPRIL 2.5 MG PO TABS: 10.0000 mg | ORAL_TABLET | Freq: Every day | ORAL | 3 refills | Status: DC

## 2018-02-06 MED ORDER — INSULIN NPH ISOPHANE & REGULAR (70-30) 100 UNIT/ML ~~LOC~~ SUSP: 10.0000 [IU] | Freq: Two times a day (BID) | SUBCUTANEOUS | 0 refills | Status: DC

## 2018-02-06 MED ORDER — PANTOPRAZOLE SODIUM 40 MG PO TBEC: 40.0000 mg | DELAYED_RELEASE_TABLET | Freq: Every day | ORAL | 6 refills | Status: DC

## 2018-02-06 MED ORDER — BLOOD GLUC METER DISP-STRIPS DEVI: 4.0000 | Freq: Three times a day (TID) | 4 refills | Status: DC

## 2018-02-06 MED ORDER — CALCIUM CARBONATE ANTACID 600 MG PO CHEW: 600.0000 mg | CHEWABLE_TABLET | Freq: Four times a day (QID) | ORAL | 3 refills | Status: DC | PRN

## 2018-02-06 MED ORDER — PREDNISONE 10 MG PO TABS: 10.0000 mg | ORAL_TABLET | Freq: Every day | ORAL | 0 refills | Status: DC

## 2018-02-06 MED ORDER — ACETAMINOPHEN 500 MG PO TABS: 1000.0000 mg | ORAL_TABLET | Freq: Every evening | ORAL | 3 refills | Status: DC | PRN

## 2018-02-06 MED ORDER — INSULIN SYRINGES (DISPOSABLE) U-100 0.3 ML MISC: 1.0000 | Freq: Two times a day (BID) | 5 refills | Status: DC

## 2018-02-06 MED ORDER — TRAZODONE HCL 50 MG PO TABS: 50.0000 mg | ORAL_TABLET | Freq: Every day | ORAL | 3 refills | Status: DC

## 2018-02-06 MED ORDER — NITROFURANTOIN MONOHYD MACRO 100 MG PO CAPS: 100.0000 mg | ORAL_CAPSULE | Freq: Every day | ORAL | 3 refills | Status: DC

## 2018-02-06 MED ORDER — MULTI-VITAMIN/MINERALS PO TABS: 1.0000 | ORAL_TABLET | Freq: Every day | ORAL | 3 refills | Status: DC

## 2018-02-06 NOTE — Progress Notes (Signed)
Patient: Julia Shea Female    DOB: 1956/03/16   62 y.o.   MRN: [id number] Visit Date: 02/06/2018  Today's Provider: Deforest Hoyles, NP   Chief Complaint  Patient presents with  . Follow-up   Subjective:    HPI: This is a 62 year old female with complex medical problems presenting for routine follow-up.  She was recently hospitalized with sepsis and discharged on oral antibiotics.  She has completed antibiotic course.  She is here for a posthospitalization follow-up.  She has an extensive list of medical concerns. 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  She was last seen by her urologist more than 6 months ago.  The catheter was last changed when she was hospitalized.  She denies any fever chills nausea and vomiting since discharge from the hospital.  She is not on any UTI prophylaxis Chronic back and bilateral hip pain: Patient states that she is unable to ambulate and experiences severe pain which is worse at night.  She rates the pain as a 10 on 10 in intensity at bedtime and about an 8 on 10 when during the day.  Her x-ray showed osteoarthritis of the bilateral hip and spine.  She is currently on prednisone and Ultram for pain and states that that combination seems to work.  She uses a wheelchair and is unable to self transfer.  Self-care inability: She lives alone and unable to perform her activities of daily living.  She requires 2 people for transfer out of her bed to the wheelchair.  She has been getting assistance from her neighbor and her friends.  She is supposed to be evaluated by the social services for home care assistance tomorrow.  She was referred for physical therapy but she was unable to perform it due to pain. Type 2 diabetes uncontrolled: She is on insulin and metformin.  She denies any hypoglycemic episodes.  Her blood sugars have been much controlled and she states that readings have been less than  200 mg/dL at home.  She had multiple episodes of nausea and vomiting prior to hospitalization but none since returning from the hospital.  She has taken a couple of doses of Zofran which was prescribed for nausea and vomiting.  She denies any abdominal pain and hypoglycemia.  She reports compliance with her antidiabetic medications. Malnutrition: Her appetite remains poor and oral intake is worsened by occasional nausea and vomiting.  She reports that she has been making an effort to eat more since returning from the hospital Depression: She reports poor sleep quality despite taking melatonin.  She states that she still gets very tearful and sad.  She denies any suicidal ideation or homicidal ideation.  Allergies  Allergen Reactions  . Penicillins     Has patient had a PCN reaction causing immediate rash, facial/tongue/throat swelling, SOB or lightheadedness with hypotension: Yes Has patient had a PCN reaction causing severe rash involving mucus membranes or skin necrosis: Yes Has patient had a PCN reaction that required hospitalization: Yes Has patient had a PCN reaction occurring within the last 10 years: Yes If all of the above answers are "NO", then may proceed with Cephalosporin use.  . Sulfa Antibiotics    Previous Medications   INSULIN NPH-REGULAR HUMAN (NOVOLIN 70/30) (70-30) 100 UNIT/ML INJECTION    Inject 10 Units into the skin 2 (two) times daily with a meal.   INSULIN PEN NEEDLE (NOVOFINE) 32G X 6 MM MISC  1 Units by Does not apply route as needed.   INSULIN SYRINGES, DISPOSABLE, U-100 0.3 ML MISC    1 Syringe by Does not apply route 2 (two) times daily.   LISINOPRIL (PRINIVIL,ZESTRIL) 20 MG TABLET    Take 1 tablet (20 mg total) by mouth daily.   METFORMIN (GLUCOPHAGE) 500 MG TABLET    Take 500 mg by mouth 2 (two) times daily.   MULTIPLE VITAMINS-MINERALS (MULTIVITAMIN WITH MINERALS) TABLET    Take 1 tablet by mouth daily.   PANTOPRAZOLE (PROTONIX) 40 MG TABLET    Take 1 tablet (40  mg total) by mouth daily.   TRAMADOL (ULTRAM) 50 MG TABLET    Take 1 tablet (50 mg total) by mouth every 6 (six) hours as needed for moderate pain.    Review of Systems  Constitutional: Positive for activity change, appetite change and unexpected weight change (Weight loss).       Unable to ambulate independently  HENT: Negative.   Eyes: Negative.   Respiratory: Negative.   Cardiovascular: Negative.   Gastrointestinal: Positive for nausea and vomiting. Negative for blood in stool and constipation.  Endocrine: Negative.   Genitourinary:       Chronic Foley catheter and bladder spasms  Musculoskeletal: Positive for back pain and gait problem (Wheelchair confined).  Skin: Negative.   Allergic/Immunologic: Negative.   Neurological: Positive for weakness. Negative for dizziness and headaches.  Psychiatric/Behavioral: Positive for sleep disturbance.    Social History   Tobacco Use  . Smoking status: Never Smoker  . Smokeless tobacco: Never Used  Substance Use Topics  . Alcohol use: No   Objective:   BP 101/69   Pulse (!) 104   Temp 97.9 F (36.6 C)   Ht 5\' 2"  (1.575 m)   Wt 109 lb (49.4 kg)   BMI 19.94 kg/m   Physical Exam  Constitutional: She is oriented to person, place, and time.  Appears cachectic  HENT:  Head: Normocephalic and atraumatic.  Nose: Nose normal.  Eyes: Pupils are equal, round, and reactive to light. Conjunctivae and EOM are normal.  Neck: Normal range of motion. Neck supple.  Cardiovascular: Normal rate, regular rhythm, normal heart sounds and intact distal pulses.  Pulmonary/Chest: Effort normal and breath sounds normal.  Abdominal: Soft. Bowel sounds are normal.  Genitourinary:  Genitourinary Comments: Chronic indwelling Foley with clear urine  Neurological: She is alert and oriented to person, place, and time.  Skin: Skin is warm.  Poor turgor  Psychiatric:  Depressed and anxious        Assessment & Plan:  1. Diabetes mellitus type 2,  with complication, on long term insulin pump (HCC) Stop metformin in light of nausea and vomiting.  Continue current insulin dose.  Monitor blood glucose and log readings.  Patient advised to return to the clinic if blood sugar levels less than 100 mg/dL or greater than 250 mg/dL consistently.  Will obtain repeat hemoglobin A1c level.  Schedule endocrinology follow-up - HgB A1c  2. Non-intractable vomiting with nausea, unspecified vomiting type Continue as needed Zofran  3. Chronic indwelling Foley catheter - Ambulatory referral to Urology -Start Macrobid 100 mg nightly for UTI prophylaxis  4. Bladder problem Follow-up with urologist   5. Diabetes mellitus ([place]) Continue current regimen  7. Gastroesophageal reflux disease without esophagitis - PROTONIX 40 MG tablet; Take 1 tablet (40 mg total) by mouth daily.   -Maalox 600 mg every 4 hours as needed for  8. Primary osteoarthritis of both hips Follow-up  with Ortho clinic and start prednisone 20 mg daily in addition to tylenol Extra strength as needed  9. Depression, recurrent ([place]) Start trazodone 50mg  QHS  RTC in 1 week  Deforest Hoyles, NP   [facility]

## 2018-02-06 NOTE — Patient Instructions (Signed)
     [place]    , [place]    Phone:      Feb 06, 2018  Patient: Lakaya Tolen  Date of Birth: October 01, 1956  Date of Visit: Feb 06, 2018    To Whom It May Concern:  Lonie Rummell was seen and treated  on Feb 06, 2018.  She    . She is unable to take care of herself due to Bilateral hip pain, chronic foley and severe malnutrition. She will need home care services for assistance with activities of daily living and safety at home.        If you have any questions or concerns, please don't hesitate to call: [phone]   Sincerely,    Harless Nakayama, NP-C    Treatment Team:  Attending Provider: Erlene Quan, NP Nurse Practitioner: Erlene Quan, NP

## 2018-02-07 LAB — COMPREHENSIVE METABOLIC PANEL
A/G RATIO: 1.4 (ref 1.2–2.2)
ALK PHOS: 67 IU/L (ref 39–117)
ALT: 19 IU/L (ref 0–32)
AST: 11 IU/L (ref 0–40)
Albumin: 3.9 g/dL (ref 3.6–4.8)
BUN/Creatinine Ratio: 55 — ABNORMAL HIGH (ref 12–28)
BUN: 33 mg/dL — ABNORMAL HIGH (ref 8–27)
Bilirubin Total: 0.6 mg/dL (ref 0.0–1.2)
CO2: 26 mmol/L (ref 20–29)
Calcium: 9.6 mg/dL (ref 8.7–10.3)
Chloride: 95 mmol/L — ABNORMAL LOW (ref 96–106)
Creatinine, Ser: 0.6 mg/dL (ref 0.57–1.00)
GFR calc Af Amer: 112 mL/min/{1.73_m2} (ref >59)
GFR calc non Af Amer: 97 mL/min/{1.73_m2} (ref >59)
GLOBULIN, TOTAL: 2.7 g/dL (ref 1.5–4.5)
Glucose: 182 mg/dL — ABNORMAL HIGH (ref 65–99)
POTASSIUM: 4.8 mmol/L (ref 3.5–5.2)
SODIUM: 134 mmol/L (ref 134–144)
Total Protein: 6.6 g/dL (ref 6.0–8.5)

## 2018-02-07 LAB — CBC WITH DIFFERENTIAL/PLATELET
BASOS: 0 %
Basophils Absolute: 0 10*3/uL (ref 0.0–0.2)
EOS (ABSOLUTE): 0.2 10*3/uL (ref 0.0–0.4)
EOS: 1 %
HEMATOCRIT: 34.4 % (ref 34.0–46.6)
Hemoglobin: 11.6 g/dL (ref 11.1–15.9)
Immature Grans (Abs): 0.1 10*3/uL (ref 0.0–0.1)
Immature Granulocytes: 1 %
LYMPHS ABS: 4.4 10*3/uL — AB (ref 0.7–3.1)
Lymphs: 27 %
MCH: 27.5 pg (ref 26.6–33.0)
MCHC: 33.7 g/dL (ref 31.5–35.7)
MCV: 82 fL (ref 79–97)
MONOS ABS: 0.7 10*3/uL (ref 0.1–0.9)
Monocytes: 4 %
Neutrophils Absolute: 10.8 10*3/uL — ABNORMAL HIGH (ref 1.4–7.0)
Neutrophils: 67 %
Platelets: 463 10*3/uL — ABNORMAL HIGH (ref 150–379)
RBC: 4.22 x10E6/uL (ref 3.77–5.28)
RDW: 18.5 % — AB (ref 12.3–15.4)
WBC: 16.2 10*3/uL — ABNORMAL HIGH (ref 3.4–10.8)

## 2018-02-07 LAB — HEMOGLOBIN A1C
Est. average glucose Bld gHb Est-mCnc: 171 mg/dL
HEMOGLOBIN A1C: 7.6 % — AB (ref 4.8–5.6)

## 2018-02-07 LAB — PHOSPHORUS: Phosphorus: 2.9 mg/dL (ref 2.5–4.5)

## 2018-02-07 LAB — MAGNESIUM: MAGNESIUM: 1.9 mg/dL (ref 1.6–2.3)

## 2018-02-12 ENCOUNTER — Encounter: Payer: Self-pay | Admitting: Urology

## 2018-02-12 ENCOUNTER — Ambulatory Visit: Payer: Self-pay | Admitting: Specialist

## 2018-02-13 ENCOUNTER — Ambulatory Visit: Payer: Self-pay | Admitting: Ophthalmology

## 2018-02-19 ENCOUNTER — Ambulatory Visit: Payer: Self-pay | Admitting: Specialist

## 2018-02-28 ENCOUNTER — Encounter: Payer: Self-pay | Admitting: *Deleted

## 2018-02-28 ENCOUNTER — Emergency Department
Admission: EM | Admit: 2018-02-28 | Discharge: 2018-03-01 | Disposition: A | Discharge status: Home / Self Care | Payer: Medicaid Other | Attending: Emergency Medicine | Admitting: Emergency Medicine

## 2018-02-28 ENCOUNTER — Other Ambulatory Visit: Payer: Self-pay

## 2018-02-28 DIAGNOSIS — K6289 Other specified diseases of anus and rectum: Secondary | ICD-10-CM | POA: Diagnosis not present

## 2018-02-28 DIAGNOSIS — Z794 Long term (current) use of insulin: Secondary | ICD-10-CM | POA: Diagnosis not present

## 2018-02-28 DIAGNOSIS — Z79899 Other long term (current) drug therapy: Secondary | ICD-10-CM | POA: Insufficient documentation

## 2018-02-28 DIAGNOSIS — J45909 Unspecified asthma, uncomplicated: Secondary | ICD-10-CM | POA: Insufficient documentation

## 2018-02-28 DIAGNOSIS — E119 Type 2 diabetes mellitus without complications: Secondary | ICD-10-CM | POA: Diagnosis not present

## 2018-02-28 DIAGNOSIS — I10 Essential (primary) hypertension: Secondary | ICD-10-CM | POA: Insufficient documentation

## 2018-02-28 DIAGNOSIS — R197 Diarrhea, unspecified: Secondary | ICD-10-CM | POA: Diagnosis not present

## 2018-02-28 MED ORDER — SODIUM CHLORIDE 0.9 % IV BOLUS
1000.0000 mL | Freq: Once | INTRAVENOUS | Status: AC
  Administered 2018-03-01: 1000 mL via INTRAVENOUS

## 2018-02-28 NOTE — ED Triage Notes (Signed)
Pt arrived via EMS from home. Has been having Diarrhea x 2 days and unable to clean herself. Covered with dried stool. Pt is tearful and states her friend has been cleaning her up as best as they could. Pt goes to PT for hip problems and cannot stand or walk. Pt also has an indwelling foley cath since November for a "hole in her bladder" Urine is foul smelling and very dark and concentrated. Pt cleaned and new linens and a diaper

## 2018-02-28 NOTE — ED Provider Notes (Signed)
[facility] Emergency Department Provider Note   First MD Initiated Contact with Patient 02/28/18 2351     (approximate)  I have reviewed the triage vital signs and the nursing notes.   HISTORY  Chief Complaint Diarrhea    HPI Julia Shea is a 62 y.o. female below list of chronic medical conditions presents to the emergency department with a 2-day history of copious diarrhea that is malodorous.  Patient denies any fever no abdominal pain.  Patient denies any nausea or vomiting.    Past Medical History:  Diagnosis Date  . Asthma   . Bilateral hip pain   . 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 Chronic indwelling Foley catheter   . Closed comminuted left humeral fracture    ORIF in June 2017  . Depression   . Diabetes mellitus type 2, with complication, on long term insulin pump ([place])   . Falls frequently   . Hypertension   . MRSA infection   . Nausea & vomiting   . Rotator cuff tear arthropathy, left     Patient Active Problem List   Diagnosis Date Noted  . Diabetes mellitus type 2, with complication, on long term insulin pump ([place])   . Nausea & vomiting   . Chronic indwelling Foley catheter   . Bladder problem   . Rotator cuff tear arthropathy, left   . Malnutrition of moderate degree 01/24/2018  . Pressure injury of skin 12/22/2017  . Hepatic steatosis 05/15/2013  . Abdominal pain, right upper quadrant 05/07/2013  . Breast cancer screening 11/14/2011  . Hypertriglyceridemia 10/30/2011  . Depression 10/26/2011  . Diabetes mellitus type 2, uncontrolled ([place]) 10/26/2011  . Hypertension 10/26/2011    Past Surgical History:  Procedure Laterality Date  . LAPAROSCOPIC OOPHORECTOMY     removal of cyst    Prior to Admission medications   Medication Sig Start Date End Date Taking? Authorizing Provider  acetaminophen (TYLENOL) 500 MG tablet Take 2 tablets (1,000 mg  total) by mouth at bedtime as needed. 02/06/18  Yes Tukov-Yual, Arlyss Gandy, NP  Calcium Carbonate Antacid (MAALOX) 600 MG chewable tablet Chew 1 tablet (600 mg total) by mouth every 6 (six) hours as needed for heartburn. 02/06/18  Yes Tukov-Yual, Arlyss Gandy, NP  insulin NPH-regular Human (NOVOLIN 70/30) (70-30) 100 UNIT/ML injection Inject 10 Units into the skin 2 (two) times daily with a meal. 02/06/18  Yes Tukov-Yual, Magdalene S, NP  lisinopril (PRINIVIL,ZESTRIL) 2.5 MG tablet Take 4 tablets (10 mg total) by mouth daily. 02/06/18  Yes Tukov-Yual, Arlyss Gandy, NP  Multiple Vitamins-Minerals (MULTIVITAMIN WITH MINERALS) tablet Take 1 tablet by mouth daily. 02/06/18  Yes Tukov-Yual, Arlyss Gandy, NP  nitrofurantoin, macrocrystal-monohydrate, (MACROBID) 100 MG capsule Take 1 capsule (100 mg total) by mouth at bedtime. 02/06/18  Yes Tukov-Yual, Magdalene S, NP  pantoprazole (PROTONIX) 40 MG tablet Take 1 tablet (40 mg total) by mouth daily. 02/06/18  Yes Tukov-Yual, Magdalene S, NP  potassium chloride (K-DUR) 10 MEQ tablet Take 1 tablet (10 mEq total) by mouth daily. 02/06/18  Yes Tukov-Yual, Arlyss Gandy, NP  predniSONE (DELTASONE) 20 MG tablet Take 20 mg by mouth daily with breakfast.   Yes [provider]  traMADol (ULTRAM) 50 MG tablet Take 1 tablet (50 mg total) by mouth every 6 (six) hours as needed for moderate pain. 01/28/18  Yes Epifanio Lesches, MD  traZODone (DESYREL) 50 MG tablet Take 1 tablet (  50 mg total) by mouth at bedtime. 02/06/18  Yes Tukov-Yual, Arlyss Gandy, NP  Blood Gluc Meter Disp-Strips (BLOOD GLUCOSE METER DISPOSABLE) DEVI 4 strips by Does not apply route 4 (four) times daily - after meals and at bedtime. 02/06/18   Tukov-Yual, Arlyss Gandy, NP  Insulin Pen Needle (NOVOFINE) 32G X 6 MM MISC 1 Units by Does not apply route as needed. 10/30/11   Jackolyn Confer, MD  Insulin Syringes, Disposable, U-100 0.3 ML MISC 1 Syringe by Does not apply route 2 (two) times daily. 02/06/18   Erlene Quan, NP    Allergies Penicillins and Sulfa antibiotics  Family History  Problem Relation Age of Onset  . Diabetes Maternal Uncle     Social History Social History   Tobacco Use  . Smoking status: Never Smoker  . Smokeless tobacco: Never Used  Substance Use Topics  . Alcohol use: No  . Drug use: No    Constitutional: No fever/chills Eyes: No visual changes. ENT: No sore throat. Cardiovascular: Denies chest pain. Respiratory: Denies shortness of breath. Gastrointestinal: No abdominal pain.  No nausea, no vomiting.  Positive for diarrhea no constipation. Genitourinary: Negative for dysuria. Musculoskeletal: Negative for neck pain.  Negative for back pain. Integumentary: Negative for rash. Neurological: Negative for headaches, focal weakness or numbness.  ____________________________________________   PHYSICAL EXAM:  VITAL SIGNS: ED Triage Vitals [02/28/18 2341]  Enc Vitals Group     BP      Pulse      Resp      Temp      Temp src      SpO2      Weight 49.4 kg (109 lb)     Height 1.575 m (5\' 2" )     Head Circumference      Peak Flow      Pain Score 0     Pain Loc      Pain Edu?      Excl. in [place]?     Constitutional: Alert and oriented. Well appearing and in no acute distress. Eyes: Conjunctivae are normal.  Head: Atraumatic. Mouth/Throat: Mucous membranes are moist. Oropharynx non-erythematous. Neck: No stridor.  Cardiovascular: Normal rate, regular rhythm. Good peripheral circulation. Grossly normal heart sounds. Respiratory: Normal respiratory effort.  No retractions. Lungs CTAB. Gastrointestinal: Soft and nontender. No distention.  Musculoskeletal: No lower extremity tenderness nor edema. No gross deformities of extremities. Neurologic:  Normal speech and language. No gross focal neurologic deficits are appreciated.  Skin:  Skin is warm, dry and intact. No rash noted. Psychiatric: Mood and affect are normal. Speech and behavior are  normal.  ____________________________________________   LABS (all labs ordered are listed, but only abnormal results are displayed)  Labs Reviewed  CBC - Abnormal; Notable for the following components:      Result Value   WBC 14.0 (*)    Hemoglobin 11.7 (*)    HCT 34.0 (*)    RDW 15.4 (*)    All other components within normal limits  COMPREHENSIVE METABOLIC PANEL - Abnormal; Notable for the following components:   Glucose, Bld 218 (*)    BUN 23 (*)    All other components within normal limits  GASTROINTESTINAL PANEL BY PCR, STOOL (REPLACES STOOL CULTURE)  C DIFFICILE QUICK SCREEN W PCR REFLEX  LIPASE, BLOOD     RADIOLOGY I,  N Anaissa Macfadden, personally viewed and evaluated these images (plain radiographs) as part of my medical decision making, as well as reviewing the written report  by the radiologist.  ED MD interpretation: Chronic's cystitis and proctitis noted on CT scan per radiologist.  Official radiology report(s): Ct Abdomen Pelvis W Contrast  Result Date: 03/01/2018 CLINICAL DATA:  Acute onset of diarrhea. Malodorous urine. EXAM: CT ABDOMEN AND PELVIS WITH CONTRAST TECHNIQUE: Multidetector CT imaging of the abdomen and pelvis was performed using the standard protocol following bolus administration of intravenous contrast. CONTRAST:  69mL ISOVUE-300 IOPAMIDOL (ISOVUE-300) INJECTION 61% COMPARISON:  CT of the abdomen and pelvis from 11/27/2017 FINDINGS: Lower chest: The visualized lung bases are grossly clear. The visualized portions of the mediastinum are unremarkable. Hepatobiliary: The liver is unremarkable in appearance. The gallbladder is unremarkable in appearance. The common bile duct remains normal in caliber. Pancreas: The pancreas is within normal limits. Spleen: The spleen is unremarkable in appearance. Adrenals/Urinary Tract: The adrenal glands are unremarkable in appearance. Minimal bilateral perinephric stranding is noted. Tiny right renal cysts are seen. There  is no evidence of hydronephrosis. No renal or ureteral stones are identified. Stomach/Bowel: The stomach is unremarkable in appearance. The small bowel is within normal limits. The appendix is normal in caliber, without evidence of appendicitis. The colon is unremarkable in appearance. Wall thickening along the rectum raises concern for proctitis. The rectum is filled with dense stool. Presacral stranding is noted. Vascular/Lymphatic: Scattered calcification is seen along the abdominal aorta and its branches. The abdominal aorta is otherwise grossly unremarkable. The inferior vena cava is grossly unremarkable. No retroperitoneal lymphadenopathy is seen. No pelvic sidewall lymphadenopathy is identified. Reproductive: Diffuse irregular bladder wall thickening is noted, concerning for severe chronic cystitis. A Foley catheter is noted, with associated air in the bladder. The uterus is unremarkable in appearance. No suspicious adnexal masses are seen. Other: No additional soft tissue abnormalities are seen. Musculoskeletal: No acute osseous abnormalities are identified. The visualized musculature is unremarkable in appearance. IMPRESSION: 1. Diffuse irregular bladder wall thickening, concerning for severe chronic cystitis. 2. Wall thickening along the rectum raises concern for proctitis. Rectum filled with dense stool. 3. Tiny right renal cysts seen. Aortic Atherosclerosis (ICD10-I70.0). Electronically Signed   By: Garald Balding M.D.   On: 03/01/2018 05:17     Procedures   ____________________________________________   INITIAL IMPRESSION / ASSESSMENT AND PLAN / ED COURSE  As part of my medical decision making, I reviewed the following data within the electronic MEDICAL RECORD NUMBER   62 year old female presenting with above-stated history and physical exam of diarrhea concerning for infectious etiology including C. difficile given long-standing use of oral antibiotics as such as C. difficile obtain as well as  stool cultures which were all negative.  CT scan of the abdomen pelvis revealed evidence of proctitis.  She is given Imodium in the department and advised these take the same at home.  Patient given 2 L IV normal saline while in the ED. ____________________________________________  FINAL CLINICAL IMPRESSION(S) / ED DIAGNOSES  Final diagnoses:  Diarrhea in adult patient  Proctitis     MEDICATIONS GIVEN DURING THIS VISIT:  Medications  sodium chloride 0.9 % bolus 1,000 mL (0 mLs Intravenous Stopped 03/01/18 0146)  loperamide (IMODIUM) capsule 4 mg (4 mg Oral Given 03/01/18 0152)  traMADol (ULTRAM) tablet 50 mg (50 mg Oral Given 03/01/18 0205)  iopamidol (ISOVUE-300) 61 % injection 75 mL (75 mLs Intravenous Contrast Given 03/01/18 0442)     ED Discharge Orders    None       Note:  This document was prepared using Dragon voice recognition software and may include  unintentional dictation errors.    Gregor Hams, MD 03/01/18 [phone]

## 2018-03-01 ENCOUNTER — Emergency Department: Payer: Medicaid Other

## 2018-03-01 LAB — GASTROINTESTINAL PANEL BY PCR, STOOL (REPLACES STOOL CULTURE)
ASTROVIRUS: NOT DETECTED
Adenovirus F40/41: NOT DETECTED
Campylobacter species: NOT DETECTED
Cryptosporidium: NOT DETECTED
Cyclospora cayetanensis: NOT DETECTED
ENTAMOEBA HISTOLYTICA: NOT DETECTED
ENTEROAGGREGATIVE E COLI (EAEC): NOT DETECTED
ENTEROTOXIGENIC E COLI (ETEC): NOT DETECTED
Enteropathogenic E coli (EPEC): NOT DETECTED
Giardia lamblia: NOT DETECTED
NOROVIRUS GI/GII: NOT DETECTED
Plesimonas shigelloides: NOT DETECTED
Rotavirus A: NOT DETECTED
SAPOVIRUS (I, II, IV, AND V): NOT DETECTED
SHIGA LIKE TOXIN PRODUCING E COLI (STEC): NOT DETECTED
Salmonella species: NOT DETECTED
Shigella/Enteroinvasive E coli (EIEC): NOT DETECTED
VIBRIO CHOLERAE: NOT DETECTED
Vibrio species: NOT DETECTED
Yersinia enterocolitica: NOT DETECTED

## 2018-03-01 LAB — COMPREHENSIVE METABOLIC PANEL
ALBUMIN: 3.5 g/dL (ref 3.5–5.0)
ALT: 14 U/L (ref 14–54)
AST: 16 U/L (ref 15–41)
Alkaline Phosphatase: 112 U/L (ref 38–126)
Anion gap: 10 (ref 5–15)
BILIRUBIN TOTAL: 1 mg/dL (ref 0.3–1.2)
BUN: 23 mg/dL — AB (ref 6–20)
CHLORIDE: 101 mmol/L (ref 101–111)
CO2: 24 mmol/L (ref 22–32)
Calcium: 9.3 mg/dL (ref 8.9–10.3)
Creatinine, Ser: 0.58 mg/dL (ref 0.44–1.00)
GFR calc Af Amer: >60 mL/min (ref >60)
GFR calc non Af Amer: >60 mL/min (ref >60)
GLUCOSE: 218 mg/dL — AB (ref 65–99)
POTASSIUM: 3.5 mmol/L (ref 3.5–5.1)
SODIUM: 135 mmol/L (ref 135–145)
TOTAL PROTEIN: 6.6 g/dL (ref 6.5–8.1)

## 2018-03-01 LAB — C DIFFICILE QUICK SCREEN W PCR REFLEX
C DIFFICLE (CDIFF) ANTIGEN: NEGATIVE
C Diff interpretation: NOT DETECTED
C Diff toxin: NEGATIVE

## 2018-03-01 LAB — CBC
HEMATOCRIT: 34 % — AB (ref 35.0–47.0)
Hemoglobin: 11.7 g/dL — ABNORMAL LOW (ref 12.0–16.0)
MCH: 29 pg (ref 26.0–34.0)
MCHC: 34.5 g/dL (ref 32.0–36.0)
MCV: 84.1 fL (ref 80.0–100.0)
Platelets: 423 10*3/uL (ref 150–440)
RBC: 4.04 MIL/uL (ref 3.80–5.20)
RDW: 15.4 % — AB (ref 11.5–14.5)
WBC: 14 10*3/uL — AB (ref 3.6–11.0)

## 2018-03-01 LAB — LIPASE, BLOOD: Lipase: 21 U/L (ref 11–51)

## 2018-03-01 MED ORDER — TRAMADOL HCL 50 MG PO TABS
50.0000 mg | ORAL_TABLET | Freq: Once | ORAL | Status: AC
Start: 2018-03-01 — End: 2018-03-01
  Administered 2018-03-01: 50 mg via ORAL
  Filled 2018-03-01: qty 1

## 2018-03-01 MED ORDER — IOPAMIDOL (ISOVUE-300) INJECTION 61%
75.0000 mL | Freq: Once | INTRAVENOUS | Status: AC | PRN
  Administered 2018-03-01: 75 mL via INTRAVENOUS

## 2018-03-01 MED ORDER — LOPERAMIDE HCL 2 MG PO CAPS
4.0000 mg | ORAL_CAPSULE | Freq: Once | ORAL | Status: AC
  Administered 2018-03-01: 4 mg via ORAL
  Filled 2018-03-01: qty 2

## 2018-03-01 NOTE — ED Notes (Signed)
Pt lying on stretcher with side rails up. Awaiting [facility] transport back home.

## 2018-03-01 NOTE — ED Notes (Signed)
Patient transported to CT 

## 2018-03-01 NOTE — ED Notes (Signed)
[facility] called for transport back home  

## 2018-03-05 ENCOUNTER — Telehealth: Payer: Self-pay

## 2018-03-05 ENCOUNTER — Ambulatory Visit: Payer: Self-pay | Admitting: Specialist

## 2018-03-05 DIAGNOSIS — M79661 Pain in right lower leg: Secondary | ICD-10-CM

## 2018-03-05 DIAGNOSIS — M79662 Pain in left lower leg: Principal | ICD-10-CM

## 2018-03-05 NOTE — Progress Notes (Signed)
   Subjective:    Patient ID: Julia Shea, female    DOB: Jul 29, 1956, 62 y.o.   MRN: [id number]  HPI 62 yr old with chronic indwelling Foley   with recent urosepsis. She states she is unable to walk for the past 5 or 6 months. She was a inpatient for 3 months but no therapy was done. She has been receiving outpatient PT but I have no notes. She tells me she is unable to walk because of her back and hips, But I reviewed the x ray reports which are relatively under whelming.   I have asked to be able to contact her therapist.    Review of Systems     Objective:   Physical Exam  She is a thin female sitting in a wheelchair. I did not try to have her stand.` While sitting she had no increased pain with PROM with her hips. Seated SLR neg at 90 degrees. DTR's 1+/=. Sens intact. MMT 5/5 with moderate effort being given.       Assessment & Plan:  Plan: She asked for pain meds and we told her that because of clinic rules we are not able to prescribe opiods. She asked for a refill on her prednisone and I feel that should be handled by her PCP. The best I can offer is to try and direct her rehab and get her ambulatory. I will talk to the therapist when we get that arranged.

## 2018-03-05 NOTE — Telephone Encounter (Signed)
Spoke with Dr. Vickki Hearing and he wants to have the pt see Heather before we sign off on anymore PT visits. He wants her input after she evaluates her.

## 2018-03-05 NOTE — Telephone Encounter (Signed)
[facility]ed [facility] and talked to the physical therapist. I called to see why the patient does not walk. We could not get a clear explanation as to why she does not walk. The PT wasn't so sure either. We requested the notes from the physical therapist. We are also going to request more visits for the physical therapy. The physical therapist recommended she see a mental health counselor. He feels some of her immobility issues is due to her living situation and her mental state. I have spoke with Heather. Will send over order for PT.

## 2018-03-06 ENCOUNTER — Telehealth: Payer: Self-pay | Admitting: Licensed Clinical Social Worker

## 2018-03-06 NOTE — Telephone Encounter (Signed)
Clinician reached out to Ms. Julia Shea as requested by Dr. Vickki Hearing who wanted her mental health state evaluated prior to ordering more physical therapy.   Ms. Julia Shea was agreeable to the plans and scheduled an appointment for Wednesday June 12th at 9 am for a mental health evaluation at [facility].

## 2018-03-12 ENCOUNTER — Ambulatory Visit: Payer: Self-pay | Admitting: Licensed Clinical Social Worker

## 2018-03-12 ENCOUNTER — Encounter: Payer: Self-pay | Admitting: Urology

## 2018-03-13 ENCOUNTER — Ambulatory Visit: Payer: Self-pay | Admitting: Adult Health

## 2018-03-18 ENCOUNTER — Ambulatory Visit: Payer: Self-pay

## 2018-03-25 ENCOUNTER — Telehealth: Payer: Self-pay

## 2018-03-25 NOTE — Telephone Encounter (Signed)
Called pt. No answer, no vm. Pt previously called back to confirm that she found a ride to her upcoming appt.

## 2018-03-25 NOTE — Telephone Encounter (Signed)
-----   Message from Erlene Quan, NP sent at 03/20/2018  1:31 PM EDT ----- We do not fill narcotics like tramadol here. I cannot keep her on antibiotics without seeing her. Tell her to go to the ER so they can change the catheter. She was suppose to see urology here ----- Message ----- From: Kreg Shropshire, CMA Sent: 03/19/2018   1:13 PM To: Erlene Quan, NP  Pt states the transportation bus ran out of funding so she has no one to bring her to her upcoming appts. She is not sure when the funding will be available again. She is wanting to know if she can have a refill on the tramadol, prednisone, and the antibiotic since she hasn't had her catheter changed

## 2018-03-27 ENCOUNTER — Ambulatory Visit: Payer: Self-pay | Admitting: Adult Health

## 2018-03-27 ENCOUNTER — Ambulatory Visit: Payer: Self-pay | Admitting: Ophthalmology

## 2018-03-29 ENCOUNTER — Inpatient Hospital Stay
Admission: EM | Admit: 2018-03-29 | Discharge: 2018-03-31 | DRG: 640 | Disposition: A | Discharge status: Home Health | Payer: Medicaid Other | Attending: Internal Medicine | Admitting: Internal Medicine

## 2018-03-29 ENCOUNTER — Encounter: Payer: Self-pay | Admitting: Emergency Medicine

## 2018-03-29 ENCOUNTER — Other Ambulatory Visit: Payer: Self-pay

## 2018-03-29 DIAGNOSIS — Z23 Encounter for immunization: Secondary | ICD-10-CM | POA: Diagnosis not present

## 2018-03-29 DIAGNOSIS — Z79899 Other long term (current) drug therapy: Secondary | ICD-10-CM

## 2018-03-29 DIAGNOSIS — E860 Dehydration: Secondary | ICD-10-CM | POA: Diagnosis present

## 2018-03-29 DIAGNOSIS — E43 Unspecified severe protein-calorie malnutrition: Secondary | ICD-10-CM | POA: Diagnosis present

## 2018-03-29 DIAGNOSIS — R110 Nausea: Secondary | ICD-10-CM

## 2018-03-29 DIAGNOSIS — R296 Repeated falls: Secondary | ICD-10-CM | POA: Diagnosis present

## 2018-03-29 DIAGNOSIS — Z794 Long term (current) use of insulin: Secondary | ICD-10-CM | POA: Diagnosis not present

## 2018-03-29 DIAGNOSIS — E876 Hypokalemia: Secondary | ICD-10-CM | POA: Diagnosis not present

## 2018-03-29 DIAGNOSIS — Z880 Allergy status to penicillin: Secondary | ICD-10-CM | POA: Diagnosis not present

## 2018-03-29 DIAGNOSIS — N390 Urinary tract infection, site not specified: Secondary | ICD-10-CM | POA: Diagnosis present

## 2018-03-29 DIAGNOSIS — Z681 Body mass index (BMI) 19 or less, adult: Secondary | ICD-10-CM

## 2018-03-29 DIAGNOSIS — J45909 Unspecified asthma, uncomplicated: Secondary | ICD-10-CM | POA: Diagnosis present

## 2018-03-29 DIAGNOSIS — E1165 Type 2 diabetes mellitus with hyperglycemia: Secondary | ICD-10-CM | POA: Diagnosis present

## 2018-03-29 DIAGNOSIS — Z882 Allergy status to sulfonamides status: Secondary | ICD-10-CM | POA: Diagnosis not present

## 2018-03-29 DIAGNOSIS — I161 Hypertensive emergency: Secondary | ICD-10-CM | POA: Diagnosis present

## 2018-03-29 LAB — URINALYSIS, COMPLETE (UACMP) WITH MICROSCOPIC
BACTERIA UA: NONE SEEN
Bilirubin Urine: NEGATIVE
Glucose, UA: >=500 mg/dL — AB
Ketones, ur: 5 mg/dL — AB
Nitrite: NEGATIVE
PROTEIN: NEGATIVE mg/dL
SPECIFIC GRAVITY, URINE: 1.005 (ref 1.005–1.030)
pH: 7 (ref 5.0–8.0)

## 2018-03-29 LAB — COMPREHENSIVE METABOLIC PANEL
ALBUMIN: 3.9 g/dL (ref 3.5–5.0)
ALK PHOS: 68 U/L (ref 38–126)
ALT: 8 U/L (ref 0–44)
AST: 17 U/L (ref 15–41)
Anion gap: 10 (ref 5–15)
BUN: 14 mg/dL (ref 8–23)
CALCIUM: 9.6 mg/dL (ref 8.9–10.3)
CHLORIDE: 97 mmol/L — AB (ref 98–111)
CO2: 29 mmol/L (ref 22–32)
CREATININE: 0.46 mg/dL (ref 0.44–1.00)
GFR calc Af Amer: >60 mL/min (ref >60)
GFR calc non Af Amer: >60 mL/min (ref >60)
Glucose, Bld: 309 mg/dL — ABNORMAL HIGH (ref 70–99)
Potassium: 2.2 mmol/L — CL (ref 3.5–5.1)
SODIUM: 136 mmol/L (ref 135–145)
Total Bilirubin: 0.5 mg/dL (ref 0.3–1.2)
Total Protein: 6.9 g/dL (ref 6.5–8.1)

## 2018-03-29 LAB — CBC WITH DIFFERENTIAL/PLATELET
BASOS ABS: 0.1 10*3/uL (ref 0–0.1)
Basophils Relative: 1 %
EOS PCT: 2 %
Eosinophils Absolute: 0.2 10*3/uL (ref 0–0.7)
HCT: 33.8 % — ABNORMAL LOW (ref 35.0–47.0)
Hemoglobin: 12.2 g/dL (ref 12.0–16.0)
LYMPHS PCT: 22 %
Lymphs Abs: 2.5 10*3/uL (ref 1.0–3.6)
MCH: 30.6 pg (ref 26.0–34.0)
MCHC: 36 g/dL (ref 32.0–36.0)
MCV: 85 fL (ref 80.0–100.0)
MONO ABS: 0.7 10*3/uL (ref 0.2–0.9)
MONOS PCT: 6 %
Neutro Abs: 7.9 10*3/uL — ABNORMAL HIGH (ref 1.4–6.5)
Neutrophils Relative %: 69 %
PLATELETS: 451 10*3/uL — AB (ref 150–440)
RBC: 3.98 MIL/uL (ref 3.80–5.20)
RDW: 13.4 % (ref 11.5–14.5)
WBC: 11.3 10*3/uL — ABNORMAL HIGH (ref 3.6–11.0)

## 2018-03-29 LAB — GLUCOSE, CAPILLARY: GLUCOSE-CAPILLARY: 219 mg/dL — AB (ref 70–99)

## 2018-03-29 LAB — MAGNESIUM: MAGNESIUM: 1.7 mg/dL (ref 1.7–2.4)

## 2018-03-29 LAB — LIPASE, BLOOD: Lipase: 26 U/L (ref 11–51)

## 2018-03-29 MED ORDER — PANTOPRAZOLE SODIUM 40 MG PO TBEC
40.0000 mg | DELAYED_RELEASE_TABLET | Freq: Every day | ORAL | Status: DC
  Administered 2018-03-30 – 2018-03-31 (×2): 40 mg via ORAL
  Filled 2018-03-29 (×2): qty 1

## 2018-03-29 MED ORDER — LABETALOL HCL 5 MG/ML IV SOLN
10.0000 mg | Freq: Once | INTRAVENOUS | Status: AC
  Administered 2018-03-29: 10 mg via INTRAVENOUS
  Filled 2018-03-29: qty 4

## 2018-03-29 MED ORDER — TRAZODONE HCL 50 MG PO TABS
50.0000 mg | ORAL_TABLET | Freq: Every day | ORAL | Status: DC
  Administered 2018-03-29 – 2018-03-30 (×2): 50 mg via ORAL
  Filled 2018-03-29 (×2): qty 1

## 2018-03-29 MED ORDER — LABETALOL HCL 5 MG/ML IV SOLN
INTRAVENOUS | Status: AC
  Administered 2018-03-29: 21:00:00
  Filled 2018-03-29: qty 4

## 2018-03-29 MED ORDER — LEVOFLOXACIN IN D5W 250 MG/50ML IV SOLN
250.0000 mg | INTRAVENOUS | Status: DC
Start: 2018-03-29 — End: 2018-03-30
  Administered 2018-03-29: 250 mg via INTRAVENOUS
  Filled 2018-03-29 (×2): qty 50

## 2018-03-29 MED ORDER — ONDANSETRON HCL 4 MG/2ML IJ SOLN
4.0000 mg | Freq: Once | INTRAMUSCULAR | Status: AC
  Administered 2018-03-29: 4 mg via INTRAVENOUS
  Filled 2018-03-29: qty 2

## 2018-03-29 MED ORDER — LISINOPRIL 10 MG PO TABS: 10.0000 mg | ORAL_TABLET | Freq: Every day | ORAL | Status: DC

## 2018-03-29 MED ORDER — SODIUM CHLORIDE 0.9 % IV SOLN
1.0000 g | Freq: Once | INTRAVENOUS | Status: AC
  Administered 2018-03-29: 1 g via INTRAVENOUS
  Filled 2018-03-29: qty 10

## 2018-03-29 MED ORDER — ACETAMINOPHEN 325 MG PO TABS: 650.0000 mg | ORAL_TABLET | Freq: Four times a day (QID) | ORAL | Status: DC | PRN

## 2018-03-29 MED ORDER — ACETAMINOPHEN 650 MG RE SUPP: 650.0000 mg | Freq: Four times a day (QID) | RECTAL | Status: DC | PRN

## 2018-03-29 MED ORDER — POTASSIUM CHLORIDE 10 MEQ/100ML IV SOLN
10.0000 meq | Freq: Once | INTRAVENOUS | Status: AC
  Administered 2018-03-29: 10 meq via INTRAVENOUS
  Filled 2018-03-29: qty 100

## 2018-03-29 MED ORDER — PREDNISONE 20 MG PO TABS
20.0000 mg | ORAL_TABLET | Freq: Every day | ORAL | Status: DC
  Administered 2018-03-30 – 2018-03-31 (×2): 20 mg via ORAL
  Filled 2018-03-29 (×2): qty 1

## 2018-03-29 MED ORDER — POTASSIUM CHLORIDE IN NACL 20-0.9 MEQ/L-% IV SOLN
INTRAVENOUS | Status: DC
  Administered 2018-03-29 – 2018-03-30 (×2): via INTRAVENOUS
  Filled 2018-03-29 (×4): qty 1000

## 2018-03-29 MED ORDER — ENOXAPARIN SODIUM 40 MG/0.4ML ~~LOC~~ SOLN
40.0000 mg | SUBCUTANEOUS | Status: DC
  Administered 2018-03-29 – 2018-03-30 (×2): 40 mg via SUBCUTANEOUS
  Filled 2018-03-29 (×2): qty 0.4

## 2018-03-29 MED ORDER — INSULIN ASPART 100 UNIT/ML ~~LOC~~ SOLN
0.0000 [IU] | Freq: Three times a day (TID) | SUBCUTANEOUS | Status: DC
  Administered 2018-03-30: 5 [IU] via SUBCUTANEOUS
  Administered 2018-03-30: 8 [IU] via SUBCUTANEOUS
  Administered 2018-03-30: 5 [IU] via SUBCUTANEOUS
  Administered 2018-03-31: 3 [IU] via SUBCUTANEOUS
  Administered 2018-03-31: 2 [IU] via SUBCUTANEOUS
  Filled 2018-03-29 (×5): qty 1

## 2018-03-29 MED ORDER — SODIUM CHLORIDE 0.9 % IV BOLUS
1000.0000 mL | Freq: Once | INTRAVENOUS | Status: AC
  Administered 2018-03-29: 1000 mL via INTRAVENOUS

## 2018-03-29 MED ORDER — TRAMADOL HCL 50 MG PO TABS
50.0000 mg | ORAL_TABLET | Freq: Four times a day (QID) | ORAL | Status: DC | PRN
  Administered 2018-03-29 – 2018-03-31 (×4): 50 mg via ORAL
  Filled 2018-03-29 (×4): qty 1

## 2018-03-29 MED ORDER — SENNOSIDES-DOCUSATE SODIUM 8.6-50 MG PO TABS: 1.0000 | ORAL_TABLET | Freq: Every evening | ORAL | Status: DC | PRN

## 2018-03-29 MED ORDER — ONDANSETRON HCL 4 MG PO TABS: 4.0000 mg | ORAL_TABLET | Freq: Four times a day (QID) | ORAL | Status: DC | PRN

## 2018-03-29 MED ORDER — LISINOPRIL 10 MG PO TABS
10.0000 mg | ORAL_TABLET | Freq: Every day | ORAL | Status: DC
  Administered 2018-03-29 – 2018-03-31 (×3): 10 mg via ORAL
  Filled 2018-03-29 (×2): qty 1

## 2018-03-29 MED ORDER — ONDANSETRON HCL 4 MG/2ML IJ SOLN
4.0000 mg | Freq: Four times a day (QID) | INTRAMUSCULAR | Status: DC | PRN
  Administered 2018-03-30: 4 mg via INTRAVENOUS
  Filled 2018-03-29: qty 2

## 2018-03-29 MED ORDER — PNEUMOCOCCAL VAC POLYVALENT 25 MCG/0.5ML IJ INJ
0.5000 mL | INJECTION | INTRAMUSCULAR | Status: AC
  Administered 2018-03-30: 0.5 mL via INTRAMUSCULAR
  Filled 2018-03-29: qty 0.5

## 2018-03-29 MED ORDER — ADULT MULTIVITAMIN W/MINERALS CH
1.0000 | ORAL_TABLET | Freq: Every day | ORAL | Status: DC
  Administered 2018-03-30 – 2018-03-31 (×2): 1 via ORAL
  Filled 2018-03-29 (×2): qty 1

## 2018-03-29 MED ORDER — INSULIN ASPART 100 UNIT/ML ~~LOC~~ SOLN
0.0000 [IU] | Freq: Every day | SUBCUTANEOUS | Status: DC
  Administered 2018-03-29: 2 [IU] via SUBCUTANEOUS
  Administered 2018-03-30: 3 [IU] via SUBCUTANEOUS
  Filled 2018-03-29 (×2): qty 1

## 2018-03-29 MED ORDER — POTASSIUM CHLORIDE CRYS ER 20 MEQ PO TBCR
40.0000 meq | EXTENDED_RELEASE_TABLET | Freq: Once | ORAL | Status: AC
  Administered 2018-03-29: 40 meq via ORAL
  Filled 2018-03-29: qty 2

## 2018-03-29 NOTE — ED Provider Notes (Signed)
[facility] Emergency Department Provider Note  ____________________________________________  Time seen: Approximately 6:31 PM  I have reviewed the triage vital signs and the nursing notes.   HISTORY  Chief Complaint Emesis   HPI Julia Shea is a 62 y.o. female with a history of diabetes, chronic indwelling Foley catheter, hypertension who presents from home for nausea.  Patient is very upset and crying.  Tells me that she has been trying to be placed in a facility for several months and successfully due to lack of insurance.  Patient reports that her living conditions are awful.  She reports that she has difficulty walking and is unable to clean her house. She has several dogs and cats in her house. She lives alone and has some help from the neighbors. Patient reports constant moderate nausea for 3 days associated with anorexia.  Today she started having chills.  No vomiting, no diarrhea, no constipation, no abdominal pain, no chest pain, no shortness of breath, no cough or congestion.  Patient does have an indwelling Foley catheter that was last exchanged 2 months ago when she was admitted for urosepsis. It is unclear why patient has this catheter. It was placed in 2018 at OSH and no records are available for review. The urine in the foley catheter has been cloudy for several days. Patient reports that she is on cipro daily for 2 months.    Past Medical History:  Diagnosis Date  . Asthma   . Bilateral hip pain   . 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 Chronic indwelling Foley catheter   . Closed comminuted left humeral fracture    ORIF in June 2017  . Depression   . Diabetes mellitus type 2, with complication, on long term insulin pump ([place])   . Falls frequently   . Hypertension   . MRSA infection   . Nausea & vomiting   . Rotator cuff tear arthropathy, left     Patient Active  Problem List   Diagnosis Date Noted  . Hypokalemia 03/29/2018  . Diabetes mellitus type 2, with complication, on long term insulin pump ([place])   . Nausea & vomiting   . Chronic indwelling Foley catheter   . Bladder problem   . Rotator cuff tear arthropathy, left   . Malnutrition of moderate degree 01/24/2018  . Pressure injury of skin 12/22/2017  . Hepatic steatosis 05/15/2013  . Abdominal pain, right upper quadrant 05/07/2013  . Breast cancer screening 11/14/2011  . Hypertriglyceridemia 10/30/2011  . Depression 10/26/2011  . Diabetes mellitus type 2, uncontrolled ([place]) 10/26/2011  . Hypertension 10/26/2011    Past Surgical History:  Procedure Laterality Date  . LAPAROSCOPIC OOPHORECTOMY     removal of cyst    Prior to Admission medications   Medication Sig Start Date End Date Taking? Authorizing Provider  acetaminophen (TYLENOL) 500 MG tablet Take 2 tablets (1,000 mg total) by mouth at bedtime as needed. 02/06/18  Yes Tukov-Yual, Arlyss Gandy, NP  Calcium Carbonate Antacid (MAALOX) 600 MG chewable tablet Chew 1 tablet (600 mg total) by mouth every 6 (six) hours as needed for heartburn. 02/06/18  Yes Tukov-Yual, Arlyss Gandy, NP  insulin NPH-regular Human (NOVOLIN 70/30) (70-30) 100 UNIT/ML injection Inject 10 Units into the skin 2 (two) times daily with a meal. 02/06/18  Yes Tukov-Yual, Magdalene S, NP  Insulin Syringes, Disposable, U-100 0.3 ML MISC 1 Syringe by Does not apply route  2 (two) times daily. 02/06/18  Yes Tukov-Yual, Magdalene S, NP  lisinopril (PRINIVIL,ZESTRIL) 2.5 MG tablet Take 4 tablets (10 mg total) by mouth daily. 02/06/18  Yes Tukov-Yual, Arlyss Gandy, NP  Multiple Vitamins-Minerals (MULTIVITAMIN WITH MINERALS) tablet Take 1 tablet by mouth daily. 02/06/18  Yes Tukov-Yual, Arlyss Gandy, NP  nitrofurantoin, macrocrystal-monohydrate, (MACROBID) 100 MG capsule Take 1 capsule (100 mg total) by mouth at bedtime. 02/06/18  Yes Tukov-Yual, Magdalene S, NP  pantoprazole (PROTONIX) 40 MG  tablet Take 1 tablet (40 mg total) by mouth daily. 02/06/18  Yes Tukov-Yual, Magdalene S, NP  potassium chloride (K-DUR) 10 MEQ tablet Take 1 tablet (10 mEq total) by mouth daily. 02/06/18  Yes Tukov-Yual, Arlyss Gandy, NP  predniSONE (DELTASONE) 20 MG tablet Take 20 mg by mouth daily with breakfast.   Yes [provider]  traMADol (ULTRAM) 50 MG tablet Take 1 tablet (50 mg total) by mouth every 6 (six) hours as needed for moderate pain. 01/28/18  Yes Epifanio Lesches, MD  traZODone (DESYREL) 50 MG tablet Take 1 tablet (50 mg total) by mouth at bedtime. 02/06/18  Yes Tukov-Yual, Arlyss Gandy, NP  Blood Gluc Meter Disp-Strips (BLOOD GLUCOSE METER DISPOSABLE) DEVI 4 strips by Does not apply route 4 (four) times daily - after meals and at bedtime. 02/06/18   Tukov-Yual, Arlyss Gandy, NP  Insulin Pen Needle (NOVOFINE) 32G X 6 MM MISC 1 Units by Does not apply route as needed. 10/30/11   Jackolyn Confer, MD    Allergies Penicillins and Sulfa antibiotics  Family History  Problem Relation Age of Onset  . Diabetes Maternal Uncle     Social History Social History   Tobacco Use  . Smoking status: Never Smoker  . Smokeless tobacco: Never Used  Substance Use Topics  . Alcohol use: No  . Drug use: No    Review of Systems  Constitutional: Negative for fever. Eyes: Negative for visual changes. ENT: Negative for sore throat. Neck: No neck pain  Cardiovascular: Negative for chest pain. Respiratory: Negative for shortness of breath. Gastrointestinal: Negative for abdominal pain, vomiting or diarrhea. + nausea and anorexia Genitourinary: Negative for dysuria. Musculoskeletal: Negative for back pain. Skin: Negative for rash. Neurological: Negative for headaches, weakness or numbness. Psych: No SI or HI  ____________________________________________   PHYSICAL EXAM:  VITAL SIGNS: ED Triage Vitals  Enc Vitals Group     BP 03/29/18 1815 (!) 189/85     Pulse Rate 03/29/18 1815 82      Resp 03/29/18 1815 16     Temp 03/29/18 1815 98.2 F (36.8 C)     Temp Source 03/29/18 1815 Oral     SpO2 03/29/18 1815 94 %     Weight 03/29/18 1819 109 lb (49.4 kg)     Height 03/29/18 1819 5\' 2"  (1.575 m)     Head Circumference --      Peak Flow --      Pain Score 03/29/18 1818 6     Pain Loc --      Pain Edu? --      Excl. in [place]? --     Constitutional: Alert and oriented, disheveled, cachectic, crying.  HEENT:      Head: Normocephalic and atraumatic.         Eyes: Conjunctivae are normal. Sclera is non-icteric.       Mouth/Throat: Mucous membranes are moist.       Neck: Supple with no signs of meningismus. Cardiovascular: Regular rate and rhythm. No murmurs,  gallops, or rubs. 2+ symmetrical distal pulses are present in all extremities. No JVD. Respiratory: Normal respiratory effort. Lungs are clear to auscultation bilaterally. No wheezes, crackles, or rhonchi.  Gastrointestinal: Soft, non tender, and non distended with positive bowel sounds. No rebound or guarding. Genitourinary: No CVA tenderness. Foley catheters draining cloudy urine with sediment, foley bag is extremely dirty Musculoskeletal: Nontender with normal range of motion in all extremities. No edema, cyanosis, or erythema of extremities. Neurologic: Normal speech and language. Face is symmetric. Moving all extremities. No gross focal neurologic deficits are appreciated. Skin: Skin is warm, dry and intact. No rash noted. Psychiatric: Mood and affect are depressed. Speech and behavior are normal.  ____________________________________________   LABS (all labs ordered are listed, but only abnormal results are displayed)  Labs Reviewed  CBC WITH DIFFERENTIAL/PLATELET - Abnormal; Notable for the following components:      Result Value   WBC 11.3 (*)    HCT 33.8 (*)    Platelets 451 (*)    Neutro Abs 7.9 (*)    All other components within normal limits  COMPREHENSIVE METABOLIC PANEL - Abnormal; Notable for the  following components:   Potassium 2.2 (*)    Chloride 97 (*)    Glucose, Bld 309 (*)    All other components within normal limits  URINALYSIS, COMPLETE (UACMP) WITH MICROSCOPIC - Abnormal; Notable for the following components:   Color, Urine STRAW (*)    APPearance HAZY (*)    Glucose, UA >=500 (*)    Hgb urine dipstick LARGE (*)    Ketones, ur 5 (*)    Leukocytes, UA TRACE (*)    RBC / HPF >50 (*)    All other components within normal limits  GLUCOSE, CAPILLARY - Abnormal; Notable for the following components:   Glucose-Capillary 219 (*)    All other components within normal limits  MRSA PCR SCREENING  URINE CULTURE  LIPASE, BLOOD  MAGNESIUM  BASIC METABOLIC PANEL  CBC   ____________________________________________  EKG  ED ECG REPORT I, Rudene Re, the attending physician, personally viewed and interpreted this ECG.  Normal sinus rhythm, rate of 82, first-degree AV block, normal QRS and QTC, right axis deviation, no ST elevations or depressions.  No significant changes when compared to prior from 5/19 ____________________________________________  RADIOLOGY  none  ____________________________________________   PROCEDURES  Procedure(s) performed: None Procedures Critical Care performed:  None ____________________________________________   INITIAL IMPRESSION / ASSESSMENT AND PLAN / ED COURSE  61 y.o. female with a history of diabetes, chronic indwelling Foley catheter, hypertension who presents from home for nausea x 3 days, anorexia, and chills.  Patient is disheveled, cachectic, smells very strongly of urine, the Foley catheter is draining cloudy urine, the Foley bag and catheter themselves are extremely dirty, patient's clothes are stained and dirty as well.  According to EMS, patient's home is unkempt with lots of pets and poor sanitary conditions. Patient looks thin on exam and very upset. No SI or HI. Abdomen is soft and non tender. Vitals WNL. Ddx uti,  hyperglycemia, dehydration, AKI. WIll get EKG, CBC, CMP, lipase, UA. It is unclear why patient has this Foley catheter.  Based on patient's living situation, I am very concerned that the Foley catheter is a source for recurrent infections which could lead to sepsis and patient's death.  Therefore I will remove the Foley catheter and assess for any evidence of urinary retention.  If patient successfully passes voiding trial will keep the catheter out otherwise we  will place a new one. Patient has not follow up with Urology.  Clinical Course as of Mar 30 12  Sat Mar 29, 2018  1926 Patient unable to void. Bladder scan showing 258cc. Patient crying and screaming in pain. Will place foley catheter. K 2.2, will supplement PO and IV. EKG with no changes. Patient being monitored on telemetry. Will admit to [place] Before foley placement, patient was able to void. Will hold off on replacing Foley for now. Unfortunately patient was incontinent on the bed and sample unable to be obtained for UA/Ucx. Since urine is foul smelling, cloudy and patient has WBC 11.3 will treat with rocephin and send culture.    [CV]    Clinical Course User Index [CV] Alfred Levins [place], MD     As part of my medical decision making, I reviewed the following data within the [facility] notes reviewed and incorporated, Labs reviewed , EKG interpreted , Old EKG reviewed, Discussed with admitting physician , Notes from prior ED visits and [place] Controlled Substance Database    Pertinent labs & imaging results that were available during my care of the patient were reviewed by me and considered in my medical decision making (see chart for details).    ____________________________________________   FINAL CLINICAL IMPRESSION(S) / ED DIAGNOSES  Final diagnoses:  Hypokalemia  Nausea      NEW MEDICATIONS STARTED DURING THIS VISIT:  ED Discharge Orders    None       Note:  This  document was prepared using Dragon voice recognition software and may include unintentional dictation errors.    Alfred Levins, [place], MD 03/30/18 [phone]

## 2018-03-29 NOTE — ED Notes (Signed)
Patient attempted top give urine sample but defecated in her bedpan, contaminating the sample.

## 2018-03-29 NOTE — Consult Note (Signed)
Pharmacy Antibiotic Note  Julia Shea is a 62 y.o. female admitted on 03/29/2018 with UTI.  Pharmacy has been consulted for levofloxacin dosing.  Plan: Start levofloxacin 250mg  every 24 hours.   Height: 5\' 2"  (157.5 cm) Weight: 109 lb (49.4 kg) IBW/kg (Calculated) : 50.1  Temp (24hrs), Avg:98.2 F (36.8 C), Min:98.2 F (36.8 C), Max:98.2 F (36.8 C)  Recent Labs  Lab 03/29/18 1827  WBC 11.3*  CREATININE 0.46    Estimated Creatinine Clearance: 56.1 mL/min (by C-G formula based on SCr of 0.46 mg/dL).    Allergies  Allergen Reactions  . Penicillins     Has patient had a PCN reaction causing immediate rash, facial/tongue/throat swelling, SOB or lightheadedness with hypotension: Yes Has patient had a PCN reaction causing severe rash involving mucus membranes or skin necrosis: Yes Has patient had a PCN reaction that required hospitalization: Yes Has patient had a PCN reaction occurring within the last 10 years: Yes If all of the above answers are "NO", then may proceed with Cephalosporin use.  . Sulfa Antibiotics     Antimicrobials this admission: 6/29 ceftriaxone >> x1 dose 6/29 levofloxacin >>  Microbiology results: 6/29 UCx: pending  Thank you for allowing pharmacy to be a part of this patient's care.  Pernell Dupre, PharmD, BCPS Clinical Pharmacist 03/29/2018 8:30 PM

## 2018-03-29 NOTE — ED Triage Notes (Addendum)
PT to ED via EMS from home with c/o abd pain and nausea. Per EMS living conditions at home were questionable, pt tearful during triage. PT A&OX4, VSS. PT cbg was 358 per EMS.

## 2018-03-29 NOTE — ED Notes (Signed)
APS on call [place],  reported at this time for living conditions

## 2018-03-29 NOTE — H&P (Signed)
Julia Shea NAME: Julia Shea    MR#:  [id number]  DATE OF BIRTH:  10-25-1955  DATE OF ADMISSION:  03/29/2018  PRIMARY CARE PHYSICIAN: Patient, No Pcp Per   REQUESTING/REFERRING PHYSICIAN:   CHIEF COMPLAINT:   Chief Complaint  Patient presents with  . Emesis    HISTORY OF PRESENT ILLNESS: Julia Shea  is a 62 y.o. 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  Patient has poor oral intake and not drinking enough fluids for the last couple of days.  She has 2 cats and 2 dogs at home and was found with a lot of animal feces around the home.Patient has not been taking care of herself well.  She was evaluated in the emergency room was found to be dry and dehydrated and emaciated.  Potassium level is low.  She was given IV fluids and Foley catheter was removed.  Patient has foul-smelling urine.  PAST MEDICAL HISTORY:   Past Medical History:  Diagnosis Date  . Asthma   . Bilateral hip pain   . 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 Chronic indwelling Foley catheter   . Closed comminuted left humeral fracture    ORIF in June 2017  . Depression   . Diabetes mellitus type 2, with complication, on long term insulin pump ([place])   . Falls frequently   . Hypertension   . MRSA infection   . Nausea & vomiting   . Rotator cuff tear arthropathy, left     PAST SURGICAL HISTORY:  Past Surgical History:  Procedure Laterality Date  . LAPAROSCOPIC OOPHORECTOMY     removal of cyst    SOCIAL HISTORY:  Social History   Tobacco Use  . Smoking status: Never Smoker  . Smokeless tobacco: Never Used  Substance Use Topics  . Alcohol use: No    FAMILY HISTORY:  Family History  Problem Relation Age of Onset  .  Diabetes Maternal Uncle     DRUG ALLERGIES:  Allergies  Allergen Reactions  . Penicillins     Has patient had a PCN reaction causing immediate rash, facial/tongue/throat swelling, SOB or lightheadedness with hypotension: Yes Has patient had a PCN reaction causing severe rash involving mucus membranes or skin necrosis: Yes Has patient had a PCN reaction that required hospitalization: Yes Has patient had a PCN reaction occurring within the last 10 years: Yes If all of the above answers are "NO", then may proceed with Cephalosporin use.  . Sulfa Antibiotics     REVIEW OF SYSTEMS:   CONSTITUTIONAL: No fever,has fatigue and weakness.  EYES: No blurred or double vision.  EARS, NOSE, AND THROAT: No tinnitus or ear pain.  RESPIRATORY: No cough, shortness of breath, wheezing or hemoptysis.  CARDIOVASCULAR: No chest pain, orthopnea, edema.  GASTROINTESTINAL: Has nausea, vomiting,  abdominal pain.  No diarrhea GENITOURINARY: Has dysuria, no hematuria.  ENDOCRINE: No polyuria, nocturia,  HEMATOLOGY: No anemia, easy bruising or bleeding SKIN: No rash or lesion. MUSCULOSKELETAL: No joint pain or arthritis.   NEUROLOGIC: No tingling, numbness, weakness.  PSYCHIATRY: No anxiety or depression.   MEDICATIONS AT HOME:  Prior to Admission medications   Medication Sig Start Date End Date Taking? Authorizing Provider  acetaminophen (TYLENOL) 500 MG tablet Take 2 tablets (1,000  mg total) by mouth at bedtime as needed. 02/06/18  Yes Tukov-Yual, Arlyss Gandy, NP  Calcium Carbonate Antacid (MAALOX) 600 MG chewable tablet Chew 1 tablet (600 mg total) by mouth every 6 (six) hours as needed for heartburn. 02/06/18  Yes Tukov-Yual, Arlyss Gandy, NP  insulin NPH-regular Human (NOVOLIN 70/30) (70-30) 100 UNIT/ML injection Inject 10 Units into the skin 2 (two) times daily with a meal. 02/06/18  Yes Tukov-Yual, Magdalene S, NP  Insulin Syringes, Disposable, U-100 0.3 ML MISC 1 Syringe by Does not apply route 2 (two)  times daily. 02/06/18  Yes Tukov-Yual, Magdalene S, NP  lisinopril (PRINIVIL,ZESTRIL) 2.5 MG tablet Take 4 tablets (10 mg total) by mouth daily. 02/06/18  Yes Tukov-Yual, Arlyss Gandy, NP  Multiple Vitamins-Minerals (MULTIVITAMIN WITH MINERALS) tablet Take 1 tablet by mouth daily. 02/06/18  Yes Tukov-Yual, Arlyss Gandy, NP  nitrofurantoin, macrocrystal-monohydrate, (MACROBID) 100 MG capsule Take 1 capsule (100 mg total) by mouth at bedtime. 02/06/18  Yes Tukov-Yual, Magdalene S, NP  pantoprazole (PROTONIX) 40 MG tablet Take 1 tablet (40 mg total) by mouth daily. 02/06/18  Yes Tukov-Yual, Magdalene S, NP  potassium chloride (K-DUR) 10 MEQ tablet Take 1 tablet (10 mEq total) by mouth daily. 02/06/18  Yes Tukov-Yual, Arlyss Gandy, NP  predniSONE (DELTASONE) 20 MG tablet Take 20 mg by mouth daily with breakfast.   Yes [provider]  traMADol (ULTRAM) 50 MG tablet Take 1 tablet (50 mg total) by mouth every 6 (six) hours as needed for moderate pain. 01/28/18  Yes Epifanio Lesches, MD  traZODone (DESYREL) 50 MG tablet Take 1 tablet (50 mg total) by mouth at bedtime. 02/06/18  Yes Tukov-Yual, Arlyss Gandy, NP  Blood Gluc Meter Disp-Strips (BLOOD GLUCOSE METER DISPOSABLE) DEVI 4 strips by Does not apply route 4 (four) times daily - after meals and at bedtime. 02/06/18   Tukov-Yual, Arlyss Gandy, NP  Insulin Pen Needle (NOVOFINE) 32G X 6 MM MISC 1 Units by Does not apply route as needed. 10/30/11   Jackolyn Confer, MD      PHYSICAL EXAMINATION:   VITAL SIGNS: Blood pressure (!) 189/85, pulse 82, temperature 98.2 F (36.8 C), temperature source Oral, resp. rate 16, height 5\' 2"  (1.575 m), weight 49.4 kg (109 lb), SpO2 94 %.  GENERAL:  62 y.o.-year-old patient lying in the bed with no acute distress.  EYES: Pupils equal, round, reactive to light and accommodation. No scleral icterus. Extraocular muscles intact.  HEENT: Head atraumatic, normocephalic. Oropharynx dry and nasopharynx clear.  NECK:  Supple, no  jugular venous distention. No thyroid enlargement, no tenderness.  LUNGS: Normal breath sounds bilaterally, no wheezing, rales,rhonchi or crepitation. No use of accessory muscles of respiration.  CARDIOVASCULAR: S1, S2 normal. No murmurs, rubs, or gallops.  ABDOMEN: Soft, nontender, nondistended. Bowel sounds present. No organomegaly or mass.  EXTREMITIES: No pedal edema, cyanosis, or clubbing.  NEUROLOGIC: Cranial nerves II through XII are intact. Muscle strength 5/5 in all extremities. Sensation intact. Gait not checked.  PSYCHIATRIC: The patient is alert and oriented x 3.  SKIN: No obvious rash, lesion, or ulcer.   LABORATORY PANEL:   CBC Recent Labs  Lab 03/29/18 1827  WBC 11.3*  HGB 12.2  HCT 33.8*  PLT 451*  MCV 85.0  MCH 30.6  MCHC 36.0  RDW 13.4  LYMPHSABS 2.5  MONOABS 0.7  EOSABS 0.2  BASOSABS 0.1   ------------------------------------------------------------------------------------------------------------------  Chemistries  Recent Labs  Lab 03/29/18 1827  NA 136  K 2.2*  CL 97*  CO2  29  GLUCOSE 309*  BUN 14  CREATININE 0.46  CALCIUM 9.6  MG 1.7  AST 17  ALT 8  ALKPHOS 68  BILITOT 0.5   ------------------------------------------------------------------------------------------------------------------ estimated creatinine clearance is 56.1 mL/min (by C-G formula based on SCr of 0.46 mg/dL). ------------------------------------------------------------------------------------------------------------------ No results for input(s): TSH, T4TOTAL, T3FREE, THYROIDAB in the last 72 hours.  Invalid input(s): FREET3   Coagulation profile No results for input(s): INR, PROTIME in the last 168 hours. ------------------------------------------------------------------------------------------------------------------- No results for input(s): DDIMER in the last 72  hours. -------------------------------------------------------------------------------------------------------------------  Cardiac Enzymes No results for input(s): CKMB, TROPONINI, MYOGLOBIN in the last 168 hours.  Invalid input(s): CK ------------------------------------------------------------------------------------------------------------------ Invalid input(s): POCBNP  ---------------------------------------------------------------------------------------------------------------  Urinalysis    Component Value Date/Time   COLORURINE YELLOW (A) 01/23/2018 1713   APPEARANCEUR TURBID (A) 01/23/2018 1713   LABSPEC 1.020 01/23/2018 1713   PHURINE 5.0 01/23/2018 1713   GLUCOSEU NEGATIVE 01/23/2018 1713   HGBUR MODERATE (A) 01/23/2018 1713   BILIRUBINUR NEGATIVE 01/23/2018 1713   BILIRUBINUR neg 05/15/2013 1038   KETONESUR NEGATIVE 01/23/2018 1713   PROTEINUR 100 (A) 01/23/2018 1713   UROBILINOGEN 0.2 05/15/2013 1038   NITRITE NEGATIVE 01/23/2018 1713   LEUKOCYTESUR LARGE (A) 01/23/2018 1713     RADIOLOGY: No results found.  EKG: Orders placed or performed during the hospital encounter of 03/29/18  . ED EKG  . ED EKG  . EKG 12-Lead  . EKG 12-Lead    IMPRESSION AND PLAN:  62 year old female patient with history of bladder dysfunction, chronic indwelling Foley catheter, hypertension, type 2 diabetes mellitus presented to the emergency room with nausea vomiting abdominal discomfort  -Acute hypokalemia Admit patient to telemetry Intravenous potassium supplementation  -Intractable nausea vomiting Antiemetics  -Uncontrolled diabetes mellitus Controlled blood sugars with diabetic diet and sliding scale coverage with insulin  -Moderate malnutrition Dietary consult  -Acute urinary tract infection Start patient on Levaquin antibiotic Follow-up urine cultures  -Dehydration IV fluids  All the records are reviewed and case discussed with ED provider. Management  plans discussed with the patient, family and they are in agreement.  CODE STATUS:Full code Code Status History    Date Active Date Inactive Code Status Order ID Comments User Context   01/23/2018 2104 01/28/2018 2143 Full Code [id number]  Saundra Shelling, MD Inpatient   12/21/2017 1750 12/23/2017 2222 Full Code [id number]  Saundra Shelling, MD Inpatient       TOTAL TIME TAKING CARE OF THIS PATIENT: 52 minutes.    Saundra Shelling M.D on 03/29/2018 at 7:43 PM  Between 7am to 6pm - Pager - [phone]  After 6pm go to [email or web address] - password [email or web address] [facility]  Office  [phone]  CC: Primary care physician; Patient, No Pcp Per

## 2018-03-29 NOTE — ED Notes (Signed)
Secretary to page APS at this time for recommended home visit for possible unlivable conditions

## 2018-03-29 NOTE — ED Notes (Signed)
Sarah, EDT to take patient to floor.

## 2018-03-29 NOTE — ED Notes (Signed)
Pt cleaned at this time, stool noted to be on catheter tube, foley catheter d/c at this time, will continue to monitor for urine continence  per MD

## 2018-03-29 NOTE — Progress Notes (Signed)
Advanced care plan. Purpose of the Encounter: CODE STATUS Parties in Attendance: Patient Patient's Decision Capacity: Good Subjective/Patient's story: Presented to the emergency room with abdominal discomfort and nausea Objective/Medical story Has chronic indwelling Foley catheter Has foul-smelling urine and low potassium level Goals of care determination:  Advance care directives and goals of care discussed with the patient in detail Patient wants everything done which includes cardiac resuscitation, intubation and ventilator if the need arises CODE STATUS: Full code Time spent discussing advanced care planning: 16 minutes

## 2018-03-30 LAB — CBC
HCT: 31.3 % — ABNORMAL LOW (ref 35.0–47.0)
HEMOGLOBIN: 11.2 g/dL — AB (ref 12.0–16.0)
MCH: 30.2 pg (ref 26.0–34.0)
MCHC: 35.8 g/dL (ref 32.0–36.0)
MCV: 84.5 fL (ref 80.0–100.0)
Platelets: 386 10*3/uL (ref 150–440)
RBC: 3.71 MIL/uL — AB (ref 3.80–5.20)
RDW: 13.4 % (ref 11.5–14.5)
WBC: 14.3 10*3/uL — ABNORMAL HIGH (ref 3.6–11.0)

## 2018-03-30 LAB — GLUCOSE, CAPILLARY
Glucose-Capillary: 248 mg/dL — ABNORMAL HIGH (ref 70–99)
Glucose-Capillary: 219 mg/dL — ABNORMAL HIGH (ref 70–99)
Glucose-Capillary: 289 mg/dL — ABNORMAL HIGH (ref 70–99)
Glucose-Capillary: 252 mg/dL — ABNORMAL HIGH (ref 70–99)

## 2018-03-30 LAB — BASIC METABOLIC PANEL
Anion gap: 10 (ref 5–15)
BUN: 12 mg/dL (ref 8–23)
CALCIUM: 8.3 mg/dL — AB (ref 8.9–10.3)
CO2: 27 mmol/L (ref 22–32)
CREATININE: 0.51 mg/dL (ref 0.44–1.00)
Chloride: 102 mmol/L (ref 98–111)
GFR calc Af Amer: >60 mL/min (ref >60)
GFR calc non Af Amer: >60 mL/min (ref >60)
GLUCOSE: 289 mg/dL — AB (ref 70–99)
POTASSIUM: 2.7 mmol/L — AB (ref 3.5–5.1)
SODIUM: 139 mmol/L (ref 135–145)

## 2018-03-30 LAB — MRSA PCR SCREENING: MRSA by PCR: NEGATIVE

## 2018-03-30 LAB — MAGNESIUM: MAGNESIUM: 1.5 mg/dL — AB (ref 1.7–2.4)

## 2018-03-30 MED ORDER — MAGNESIUM SULFATE 2 GM/50ML IV SOLN
2.0000 g | Freq: Once | INTRAVENOUS | Status: AC
  Administered 2018-03-30: 2 g via INTRAVENOUS
  Filled 2018-03-30: qty 50

## 2018-03-30 MED ORDER — POTASSIUM CHLORIDE CRYS ER 20 MEQ PO TBCR
40.0000 meq | EXTENDED_RELEASE_TABLET | ORAL | Status: AC
  Administered 2018-03-30 (×2): 40 meq via ORAL
  Filled 2018-03-30 (×2): qty 2

## 2018-03-30 MED ORDER — PREMIER PROTEIN SHAKE
11.0000 [oz_av] | Freq: Two times a day (BID) | ORAL | Status: DC
  Administered 2018-03-30 – 2018-03-31 (×3): 11 [oz_av] via ORAL

## 2018-03-30 MED ORDER — INSULIN GLARGINE 100 UNIT/ML ~~LOC~~ SOLN
10.0000 [IU] | Freq: Every day | SUBCUTANEOUS | Status: DC
Start: 2018-03-30 — End: 2018-03-31
  Administered 2018-03-30: 10 [IU] via SUBCUTANEOUS
  Filled 2018-03-30 (×2): qty 0.1

## 2018-03-30 MED ORDER — LEVOFLOXACIN 500 MG PO TABS
500.0000 mg | ORAL_TABLET | Freq: Every day | ORAL | Status: DC
  Administered 2018-03-30 – 2018-03-31 (×2): 500 mg via ORAL
  Filled 2018-03-30: qty 1

## 2018-03-30 MED ORDER — AMLODIPINE BESYLATE 5 MG PO TABS
5.0000 mg | ORAL_TABLET | Freq: Every day | ORAL | Status: DC
  Administered 2018-03-30: 5 mg via ORAL
  Filled 2018-03-30: qty 1

## 2018-03-30 MED ORDER — HYDRALAZINE HCL 20 MG/ML IJ SOLN: 10.0000 mg | Freq: Four times a day (QID) | INTRAMUSCULAR | Status: DC | PRN

## 2018-03-30 NOTE — Plan of Care (Signed)
  Problem: Education: Goal: Knowledge of General Education information will improve Outcome: Progressing   Problem: Health Behavior/Discharge Planning: Goal: Ability to manage health-related needs will improve Outcome: Progressing   Problem: Elimination: Goal: Will not experience complications related to bowel motility Outcome: Progressing   Problem: Safety: Goal: Ability to remain free from injury will improve Outcome: Progressing

## 2018-03-30 NOTE — Progress Notes (Addendum)
[place] [facility] [place] NAME: Julia Shea    MR#:  [id number]  DATE OF BIRTH:  Dec 05, 1955  SUBJECTIVE:  CHIEF COMPLAINT:   Chief Complaint  Patient presents with  . Emesis   No nausea, vomiting or diarrhea.  But has generalized weakness. REVIEW OF SYSTEMS:  Review of Systems  Constitutional: Positive for malaise/fatigue. Negative for chills and fever.  HENT: Negative for sore throat.   Eyes: Negative for blurred vision and double vision.  Respiratory: Negative for cough, hemoptysis, shortness of breath, wheezing and stridor.   Cardiovascular: Negative for chest pain, palpitations, orthopnea and leg swelling.  Gastrointestinal: Negative for abdominal pain, blood in stool, diarrhea, melena, nausea and vomiting.  Genitourinary: Negative for dysuria, flank pain and hematuria.  Musculoskeletal: Negative for back pain and joint pain.  Skin: Negative for rash.  Neurological: Negative for dizziness, sensory change, focal weakness, seizures, loss of consciousness, weakness and headaches.  Endo/Heme/Allergies: Negative for polydipsia.  Psychiatric/Behavioral: Negative for depression. The patient is not nervous/anxious.     DRUG ALLERGIES:   Allergies  Allergen Reactions  . Penicillins     Has patient had a PCN reaction causing immediate rash, facial/tongue/throat swelling, SOB or lightheadedness with hypotension: Yes Has patient had a PCN reaction causing severe rash involving mucus membranes or skin necrosis: Yes Has patient had a PCN reaction that required hospitalization: Yes Has patient had a PCN reaction occurring within the last 10 years: Yes If all of the above answers are "NO", then may proceed with Cephalosporin use.  . Sulfa Antibiotics    VITALS:  Blood pressure (!) 158/79, pulse 83, temperature 98.4 F (36.9 C), temperature source Oral, resp. rate 18, height 5\' 2"  (1.575 m), weight 103 lb 4.8 oz (46.9 kg), SpO2 100  %. PHYSICAL EXAMINATION:  Physical Exam  Constitutional: She is oriented to person, place, and time.  Moderate malnutrition  HENT:  Head: Normocephalic.  Mouth/Throat: Oropharynx is clear and moist.  Eyes: Pupils are equal, round, and reactive to light. Conjunctivae and EOM are normal. No scleral icterus.  Neck: Normal range of motion. Neck supple. No JVD present. No tracheal deviation present.  Cardiovascular: Normal rate, regular rhythm and normal heart sounds. Exam reveals no gallop.  No murmur heard. Pulmonary/Chest: Effort normal and breath sounds normal. No respiratory distress. She has no wheezes. She has no rales.  Abdominal: Soft. Bowel sounds are normal. She exhibits no distension. There is no tenderness. There is no rebound.  Musculoskeletal: Normal range of motion. She exhibits no edema or tenderness.  Neurological: She is alert and oriented to person, place, and time. No cranial nerve deficit.  Skin: No rash noted. No erythema.  Psychiatric: She has a normal mood and affect.   LABORATORY PANEL:  Female CBC Recent Labs  Lab 03/30/18 0539  WBC 14.3*  HGB 11.2*  HCT 31.3*  PLT 386   ------------------------------------------------------------------------------------------------------------------ Chemistries  Recent Labs  Lab 03/29/18 1827 03/30/18 0539  NA 136 139  K 2.2* 2.7*  CL 97* 102  CO2 29 27  GLUCOSE 309* 289*  BUN 14 12  CREATININE 0.46 0.51  CALCIUM 9.6 8.3*  MG 1.7 1.5*  AST 17  --   ALT 8  --   ALKPHOS 68  --   BILITOT 0.5  --    RADIOLOGY:  No results found. ASSESSMENT AND PLAN:   62 year old female patient with history of bladder dysfunction, chronic indwelling Foley catheter, hypertension, type 2 diabetes mellitus  presented to the emergency room with nausea vomiting abdominal discomfort  -Acute hypokalemia K still low at 2.7. Continue intravenous potassium supplementation and follow-up level.  Hypomagnesemia.  IV magnesium and  follow-up level.  -Intractable nausea vomiting, no nausea vomiting this morning. Antiemetics PRN.  Diabetes mellitus type 2. Continue sliding scale coverage with insulin.  Add Lantus 10 units at bedtime.  -Moderate malnutrition Dietary consult  Urinary tract infection with leukocytosis Continue Levaquin and follow-up urine cultures and CBC.  -Dehydration Improved with IV fluids  Hypertension emergency.  Start Norvasc, IV hydralazine PRN.  BP is better controlled.  All the records are reviewed and case discussed with Care Management/Social Worker. Management plans discussed with the patient, family and they are in agreement.  CODE STATUS: Full Code  TOTAL TIME TAKING CARE OF THIS PATIENT: 36 minutes.   More than 50% of the time was spent in counseling/coordination of care: YES  POSSIBLE D/C IN 2 DAYS, DEPENDING ON CLINICAL CONDITION.   Demetrios Loll M.D on 03/30/2018 at 1:18 PM  Between 7am to 6pm - Pager - [phone]  After 6pm go to [email or web address] - [facility]

## 2018-03-30 NOTE — Progress Notes (Addendum)
Pt was putting out a red color urine. Pt was assesed and didn't complaints of any pain or burning sensation in the area. Notify doctor Julia Shea. No new order was place just monitor pt. Will continue to monitor.  Update 0628: Lab called for a critical potassium value of 2.7. Doctor Julia Shea was notified and ordered potassium Chloride  SA 40 mEq oral, every 4 hours. Will continue to monitor.

## 2018-03-30 NOTE — Clinical Social Work Note (Signed)
Clinical Social Work Assessment  Patient Details  Name: Julia Shea MRN: [id number] Date of Birth: 04/29/1956  Date of referral:  03/30/18               Reason for consult:  Discharge Planning, Transportation                Permission sought to share information with:  Family Supports Permission granted to share information::  Yes, Verbal Permission Granted  Name::     Madlyn Frankel- room mate and (friend) [phone]  Brother [phone]  Agency::     Relationship::     Contact Information:     Housing/Transportation Living arrangements for the past 2 months:  Apartment Source of Information:  Patient Patient Interpreter Needed:  None Criminal Activity/Legal Involvement Pertinent to Current Situation/Hospitalization:  No - Comment as needed Significant Relationships:  Friend, Siblings Lives with:  Friends Do you feel safe going back to the place where you live?  Yes Need for family participation in patient care:  Yes (Comment)  Care giving concerns: Patient is concerned for her declining health and depression   Social Worker assessment / plan: LCSW introduced myself to patient and she agreed to partake in her assessment. She was pleasant and calm and oriented x4 . 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 She is trying to get medicaid/medicare and has consulted with [place] and application for mediad is in. Patient reports she is living with her room mate ( dear friend of 43 years and will return home. She was worried because her living area was very cluttered with stuff- Her roommate is in the process of cleaning it up for her. 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 [facility] ( for [facility]) [facility]. She  reports she needs more assistance and needs help to shower. In discussion  LCSW will provide patient with bus tickets and map and [facility], as she reported this has been a issue to keep her appointments. She reports she is attached to  [facility]. She is depressed and will look into obtaining psychiatrist or therapist in the future. Resources have been provided. Her ultimate goal is to get medicaid, and return to her brothers home.  LCSW concluded assessment and patient has no further needs.  Employment status:  Retired Forensic scientist:  Other (Comment Required)(Applied for [place] to [employer] ( financial services)) PT Recommendations:  Not assessed at this time Information / Referral to community resources:  Other (Comment Required), Outpatient Psychiatric Care (Comment Required)([facility] [address] resource list- Transit bus tickets and map)  Patient/Family's Response to care:    Patient/Family's Understanding of and Emotional Response to Diagnosis, Current Treatment, and Prognosis: Patient has good understanding of her health concerns.  Emotional Assessment Appearance:  Appears stated age Attitude/Demeanor/Rapport:  Crying, Engaged Affect (typically observed):  Calm, Depressed Orientation:  Oriented to Self Alcohol / Substance use:  Not Applicable Psych involvement (Current and /or in the community):  Yes (Comment)([facility])  Discharge Needs  Concerns to be addressed:  No discharge needs identified Readmission within the last 30 days:  No Current discharge risk:  None Barriers to Discharge:  No Barriers Identified   Joana Reamer, LCSW 03/30/2018, 3:05 PM

## 2018-03-30 NOTE — Progress Notes (Signed)
Initial Nutrition Assessment  DOCUMENTATION CODES:   Severe malnutrition in context of chronic illness  INTERVENTION:  Will monitor tolerance of regular texture diet with patient's poor dentition. She may require chopped meats or a downgraded diet.  Provide Premier Protein po BID, each supplement provides 160 kcal and 30 grams of protein.  Continue daily MVI.  Encouraged adequate intake of protein at meals. Discussed foods available on menu that contain protein.  Patient is at risk for refeeding syndrome. Potassium and Magnesium are being monitored. Also recommend checking phosphorus and replacing as needed.  NUTRITION DIAGNOSIS:   Severe Malnutrition related to chronic illness(bladder dysfunction with chronic Foley, frequent UTIs and falls, N/V with UTIs, limited ambulation) as evidenced by severe fat depletion, moderate muscle depletion, severe muscle depletion.  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   Met with patient at bedside. She reports she has had a decreased appetite for a while now but it has been even worse the past 1-1.5 weeks. She has been experiencing N/V and having difficulty keeping food down. She has mainly just been taking bites of crackers and sipping clear liquids. She also has not been able to ambulate well, which is affecting her intake. She also ran out of glucometer strips so she has not been able to manage her diabetes well the past week. She reports she has had a chronic Foley since the end of 2018. She has  had frequent UTIs and frequently experiences nausea with her UTI. She does not want the Foley catheter in anymore and she is currently wearing a Purewick external catheter and is excited that she is able to urinate on her own. Patient reports her nausea is improved here. She was able to eat most of her breakfast tray and kept all of her food down. She typically drinks [employer] in strawberry flavor at home. She is amenable to trying Premier Protein here to see if she likes it.  Patient reports her UBW was 135 lbs. She is unsure when she started losing weight. Limited weight history in chart prior to 2019 so unable to see time frame of weight loss.  Medications reviewed and include: Novolog 0-15 units TID, Novolog 0-5 units QHS, Lantus 10 units QHS, pantoprazole, MVI daily, prednisone 20 mg daily, NS with KCl 20 mEq/L at 100 mL/hr, Levaquin.  Labs reviewed: CBG 219-248, Potassium 2.7, Magnesium 1.5. HgbA1c 7.6 on 02/06/2018.  Discussed with RN.  NUTRITION - FOCUSED PHYSICAL EXAM:    Most Recent Value  Orbital Region  Severe depletion  Upper Arm Region  Severe depletion  Thoracic and Lumbar Region  Severe depletion  Buccal Region  Severe depletion  Temple Region  Severe depletion  Clavicle Bone Region  Moderate depletion  Clavicle and Acromion Bone Region  Severe depletion  Scapular Bone Region  Moderate depletion  Dorsal Hand  Severe depletion  Patellar Region  Severe depletion  Anterior Thigh Region  Severe depletion  Posterior Calf Region  Severe depletion  Edema (RD Assessment)  None  Hair  Reviewed  Eyes  Reviewed  Mouth  Reviewed [poor dentition]  Skin  Reviewed  Nails  Reviewed     Diet Order:   Diet Order           Diet heart healthy/carb modified Room service appropriate? Yes; Fluid consistency: Thin  Diet effective now          EDUCATION NEEDS:   Education needs have been addressed  Skin:  Skin Assessment: Reviewed RN Assessment  Last BM:  PTA  (03/28/2018 per chart)  Height:   Ht Readings from Last 1 Encounters:  03/29/18 '5\' 2"'  (1.575 m)    Weight:   Wt Readings from Last 1 Encounters:  03/29/18 103 lb 4.8 oz (46.9 kg)    Ideal Body Weight:  50 kg  BMI:  Body mass index is 18.89 kg/m.  Estimated Nutritional Needs:   Kcal:  1081-0653 (30-35 kcal/kg)  Protein:  70-80 grams (1.5-1.7 grams/kg)  Fluid:  1.4-1.6 L/day (30-35 mL/kg)  Willey Blade, MS, RD, LDN Office: [phone] Pager: [phone] After Hours/Weekend Pager: [phone]

## 2018-03-31 DIAGNOSIS — E43 Unspecified severe protein-calorie malnutrition: Secondary | ICD-10-CM

## 2018-03-31 LAB — BASIC METABOLIC PANEL
Anion gap: 6 (ref 5–15)
BUN: 16 mg/dL (ref 8–23)
CHLORIDE: 102 mmol/L (ref 98–111)
CO2: 30 mmol/L (ref 22–32)
CREATININE: 0.36 mg/dL — AB (ref 0.44–1.00)
Calcium: 8.9 mg/dL (ref 8.9–10.3)
GFR calc non Af Amer: >60 mL/min (ref >60)
Glucose, Bld: 123 mg/dL — ABNORMAL HIGH (ref 70–99)
Potassium: 3.7 mmol/L (ref 3.5–5.1)
SODIUM: 138 mmol/L (ref 135–145)

## 2018-03-31 LAB — CBC
HCT: 33.1 % — ABNORMAL LOW (ref 35.0–47.0)
Hemoglobin: 11.4 g/dL — ABNORMAL LOW (ref 12.0–16.0)
MCH: 29.7 pg (ref 26.0–34.0)
MCHC: 34.5 g/dL (ref 32.0–36.0)
MCV: 86.1 fL (ref 80.0–100.0)
PLATELETS: 386 10*3/uL (ref 150–440)
RBC: 3.84 MIL/uL (ref 3.80–5.20)
RDW: 13.8 % (ref 11.5–14.5)
WBC: 12.7 10*3/uL — AB (ref 3.6–11.0)

## 2018-03-31 LAB — MAGNESIUM: Magnesium: 2.2 mg/dL (ref 1.7–2.4)

## 2018-03-31 LAB — PHOSPHORUS: PHOSPHORUS: 1.9 mg/dL — AB (ref 2.5–4.6)

## 2018-03-31 LAB — URINE CULTURE: Culture: <10000 — AB

## 2018-03-31 LAB — GLUCOSE, CAPILLARY
GLUCOSE-CAPILLARY: 140 mg/dL — AB (ref 70–99)
GLUCOSE-CAPILLARY: 196 mg/dL — AB (ref 70–99)

## 2018-03-31 MED ORDER — LEVOFLOXACIN 500 MG PO TABS: 500.0000 mg | ORAL_TABLET | Freq: Every day | ORAL | 0 refills | Status: DC

## 2018-03-31 MED ORDER — AMLODIPINE BESYLATE 10 MG PO TABS: 10.0000 mg | ORAL_TABLET | Freq: Every day | ORAL | 0 refills | Status: DC

## 2018-03-31 MED ORDER — AMLODIPINE BESYLATE 10 MG PO TABS
10.0000 mg | ORAL_TABLET | Freq: Every day | ORAL | Status: DC
  Administered 2018-03-31: 10 mg via ORAL
  Filled 2018-03-31: qty 1

## 2018-03-31 NOTE — Discharge Summary (Addendum)
[facility] [place] NAME: Saanvi Hakala    MR#:  [id number]  DATE OF BIRTH:  1956-03-19  DATE OF ADMISSION:  03/29/2018   ADMITTING PHYSICIAN: Saundra Shelling, MD  DATE OF DISCHARGE: 03/31/2018 PRIMARY CARE PHYSICIAN: Patient, No Pcp Per   ADMISSION DIAGNOSIS:  Hypokalemia [E87.6] Nausea [R11.0] DISCHARGE DIAGNOSIS:  Active Problems:   Hypokalemia   Protein-calorie malnutrition, severe  SECONDARY DIAGNOSIS:   Past Medical History:  Diagnosis Date  . Asthma   . Bilateral hip pain   . 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 Chronic indwelling Foley catheter   . Closed comminuted left humeral fracture    ORIF in June 2017  . Depression   . Diabetes mellitus type 2, with complication, on long term insulin pump ([facility])   . Falls frequently   . Hypertension   . MRSA infection   . Nausea & vomiting   . Rotator cuff tear arthropathy, left    HOSPITAL COURSE:  62 year old female patient with history of bladder dysfunction, chronic indwelling Foley catheter, hypertension, type 2 diabetes mellitus presented to the emergency room with nausea vomiting abdominal discomfort  -Acute hypokalemia Improved with potassium supplementation.  Hypomagnesemia.  Improved with IV magnesium.  -Intractable nausea vomiting, no nausea vomiting after admission. Antiemetics PRN.  Diabetes mellitus type 2. Continue sliding scale coverage with insulin.  Add Lantus 10 units at bedtime.  -severe malnutrition Dietary consult  Urinary tract infection with leukocytosis Continue Levaquin for 3 more days. Leukocytosis improving. Urine culture: no growth.  -Dehydration Improved with IV fluids  Hypertension emergency.  Started Norvasc, IV hydralazine PRN.  BP is better controlled. Weakness: HHPT. DISCHARGE CONDITIONS:  Stable, discharge to home with HHPT today. CONSULTS OBTAINED:    DRUG ALLERGIES:   Allergies  Allergen Reactions  . Penicillins     Has patient had a PCN reaction causing immediate rash, facial/tongue/throat swelling, SOB or lightheadedness with hypotension: Yes Has patient had a PCN reaction causing severe rash involving mucus membranes or skin necrosis: Yes Has patient had a PCN reaction that required hospitalization: Yes Has patient had a PCN reaction occurring within the last 10 years: Yes If all of the above answers are "NO", then may proceed with Cephalosporin use.  . Sulfa Antibiotics    DISCHARGE MEDICATIONS:   Allergies as of 03/31/2018      Reactions   Penicillins    Has patient had a PCN reaction causing immediate rash, facial/tongue/throat swelling, SOB or lightheadedness with hypotension: Yes Has patient had a PCN reaction causing severe rash involving mucus membranes or skin necrosis: Yes Has patient had a PCN reaction that required hospitalization: Yes Has patient had a PCN reaction occurring within the last 10 years: Yes If all of the above answers are "NO", then may proceed with Cephalosporin use.   Sulfa Antibiotics       Medication List    TAKE these medications   acetaminophen 500 MG tablet Commonly known as:  TYLENOL Take 2 tablets (1,000 mg total) by mouth at bedtime as needed.   amLODipine 10 MG tablet Commonly known as:  NORVASC Take 1 tablet (10 mg total) by mouth daily. Start taking on:  04/01/2018   BLOOD GLUCOSE METER DISPOSABLE Devi 4 strips by Does not apply route 4 (four) times daily - after meals and at bedtime.   Calcium Carbonate Antacid 600 MG chewable tablet Commonly  known as:  MAALOX Chew 1 tablet (600 mg total) by mouth every 6 (six) hours as needed for heartburn.   insulin NPH-regular Human (70-30) 100 UNIT/ML injection Commonly known as:  NOVOLIN 70/30 Inject 10 Units into the skin 2 (two) times daily with a meal.   Insulin Pen Needle 32G X 6 MM Misc Commonly known as:  NOVOFINE 1 Units by  Does not apply route as needed.   Insulin Syringes (Disposable) U-100 0.3 ML Misc 1 Syringe by Does not apply route 2 (two) times daily.   levofloxacin 500 MG tablet Commonly known as:  LEVAQUIN Take 1 tablet (500 mg total) by mouth daily. Start taking on:  04/01/2018   lisinopril 2.5 MG tablet Commonly known as:  PRINIVIL,ZESTRIL Take 4 tablets (10 mg total) by mouth daily.   multivitamin with minerals tablet Take 1 tablet by mouth daily.   nitrofurantoin (macrocrystal-monohydrate) 100 MG capsule Commonly known as:  MACROBID Take 1 capsule (100 mg total) by mouth at bedtime.   pantoprazole 40 MG tablet Commonly known as:  PROTONIX Take 1 tablet (40 mg total) by mouth daily.   potassium chloride 10 MEQ tablet Commonly known as:  K-DUR Take 1 tablet (10 mEq total) by mouth daily.   predniSONE 20 MG tablet Commonly known as:  DELTASONE Take 20 mg by mouth daily with breakfast.   traMADol 50 MG tablet Commonly known as:  ULTRAM Take 1 tablet (50 mg total) by mouth every 6 (six) hours as needed for moderate pain.   traZODone 50 MG tablet Commonly known as:  DESYREL Take 1 tablet (50 mg total) by mouth at bedtime.        DISCHARGE INSTRUCTIONS:  See AVS.  If you experience worsening of your admission symptoms, develop shortness of breath, life threatening emergency, suicidal or homicidal thoughts you must seek medical attention immediately by calling 911 or calling your MD immediately  if symptoms less severe.  You Must read complete instructions/literature along with all the possible adverse reactions/side effects for all the Medicines you take and that have been prescribed to you. Take any new Medicines after you have completely understood and accpet all the possible adverse reactions/side effects.   Please note  You were cared for by a hospitalist during your hospital stay. If you have any questions about your discharge medications or the care you received while you  were in the hospital after you are discharged, you can call the unit and asked to speak with the hospitalist on call if the hospitalist that took care of you is not available. Once you are discharged, your primary care physician will handle any further medical issues. Please note that NO REFILLS for any discharge medications will be authorized once you are discharged, as it is imperative that you return to your primary care physician (or establish a relationship with a primary care physician if you do not have one) for your aftercare needs so that they can reassess your need for medications and monitor your lab values.    On the day of Discharge:  VITAL SIGNS:  Blood pressure (!) 170/86, pulse 85, temperature (!) 97.3 F (36.3 C), temperature source Oral, resp. rate 18, height 5\' 2"  (1.575 m), weight 103 lb 4.8 oz (46.9 kg), SpO2 100 %. PHYSICAL EXAMINATION:  GENERAL:  62 y.o.-year-old patient lying in the bed with no acute distress.  EYES: Pupils equal, round, reactive to light and accommodation. No scleral icterus. Extraocular muscles intact.  HEENT: Head atraumatic, normocephalic. Oropharynx  and nasopharynx clear.  NECK:  Supple, no jugular venous distention. No thyroid enlargement, no tenderness.  LUNGS: Normal breath sounds bilaterally, no wheezing, rales,rhonchi or crepitation. No use of accessory muscles of respiration.  CARDIOVASCULAR: S1, S2 normal. No murmurs, rubs, or gallops.  ABDOMEN: Soft, non-tender, non-distended. Bowel sounds present. No organomegaly or mass.  EXTREMITIES: No pedal edema, cyanosis, or clubbing.  NEUROLOGIC: Cranial nerves II through XII are intact. Muscle strength 3/5 in all extremities. Sensation intact. Gait not checked.  PSYCHIATRIC: The patient is alert and oriented x 3.  SKIN: No obvious rash, lesion, or ulcer.  DATA REVIEW:   CBC Recent Labs  Lab 03/31/18 0441  WBC 12.7*  HGB 11.4*  HCT 33.1*  PLT 386    Chemistries  Recent Labs  Lab  03/29/18 1827  03/31/18 0441  NA 136   < > 138  K 2.2*   < > 3.7  CL 97*   < > 102  CO2 29   < > 30  GLUCOSE 309*   < > 123*  BUN 14   < > 16  CREATININE 0.46   < > 0.36*  CALCIUM 9.6   < > 8.9  MG 1.7   < > 2.2  AST 17  --   --   ALT 8  --   --   ALKPHOS 68  --   --   BILITOT 0.5  --   --    < > = values in this interval not displayed.     Microbiology Results  Results for orders placed or performed during the hospital encounter of 03/29/18  Urine Culture     Status: Abnormal   Collection Time: 03/29/18  9:57 PM  Result Value Ref Range Status   Specimen Description   Final    URINE, RANDOM Performed at [facility], [address], [place], [place] [address]    Special Requests   Final    NONE Performed at [facility], [address] [place], [place] [address]    Culture (A)  Final    <10,000 COLONIES/mL INSIGNIFICANT GROWTH Performed at [facility], [place] [address] [place], [place] [address]    Report Status 03/31/2018 FINAL  Final  MRSA PCR Screening     Status: None   Collection Time: 03/29/18 10:30 PM  Result Value Ref Range Status   MRSA by PCR NEGATIVE NEGATIVE Final    Comment:        The GeneXpert MRSA Assay (FDA approved for NASAL specimens only), is one component of a comprehensive MRSA colonization surveillance program. It is not intended to diagnose MRSA infection nor to guide or monitor treatment for MRSA infections. Performed at [facility], [address], [place], [place] [address]     RADIOLOGY:  No results found.   Management plans discussed with the patient, family and they are in agreement.  CODE STATUS: Full Code   TOTAL TIME TAKING CARE OF THIS PATIENT: 32 minutes.    Demetrios Loll M.D on 03/31/2018 at 9:02 AM  Between 7am to 6pm - Pager - [phone]  After 6pm go to [email or web address] - Proofreader  [facility]  Office  [phone]  CC: Primary care  physician; Patient, No Pcp Per   Note: This dictation was prepared with Dragon dictation along with smaller phrase technology. Any transcriptional errors that result from this process are unintentional.

## 2018-03-31 NOTE — Care Management (Signed)
Patient is aware that in order to receive home health services, she has to keep the appointment at [facility].  Patient verbalizes understanding of this

## 2018-03-31 NOTE — Care Management (Addendum)
CM spoke with [facility] and scheduled follow up visits with medicine, urology and mental health for July 10 starting at 9:30 - 11 A. Patient says she can not use the [employer] because she can not get to the stop and can not ride in her wheelchair.  Says can not use cab because can not transfer independently from her wheelchair.  CM made appointment with [name] for transport and provided her with the 10 dollars need for the round trip from petty cash.  [name] informed patient is wheelchair bound.  Discussed with patient that it is expected she keep these appointments at [facility]  and if it is found she did not use the money provide to travel to the appointment on July 10, no other assistance will be provided.  [facility] will accept for RN SW PT.  EMS to transport home and patient has access to her residence and friend will be in the home to receive.

## 2018-03-31 NOTE — Progress Notes (Signed)
Patient is discharge home via EMS in a stable condition, summary and f/u care given , verbalized understanding .

## 2018-03-31 NOTE — Care Management (Signed)
Patient was to have completed application for [facility] May 2019.  Confirmed that patient is current with [facility] but she has not actually been seen since Feb 06 2018.She has cancelled all her appointments: general, urology, endo, mental health due to transportation issues.  She needs appointment on Wednesday or Thursday morning.

## 2018-03-31 NOTE — Discharge Instructions (Signed)
Continue Macrobid after 3 days of levaquin.

## 2018-03-31 NOTE — Evaluation (Signed)
Physical Therapy Evaluation Patient Details Name: Julia Shea MRN: [id number] DOB: 12/31/1955 Today's Date: 03/31/2018   History of Present Illness  63 yo female with nausea and vomting, LE weakness with hypokalemia, malnutrition. chronic foley cath.  PMHx:  MRSA, asthma, DM, HTN   Clinical Impression  Patient has global weakness, requiring assistance with all mobility at this time. Patient is non-ambulatory prior to hospital admission. Reports she has friend who provides assist at home. Patient is at increased fall risk, will benefit from continued PT for LE strengthening, transfers and ambulation. Recommending home health PT upon discharge.      Follow Up Recommendations Home health PT    Equipment Recommendations  None recommended by PT    Recommendations for Other Services       Precautions / Restrictions Precautions Precautions: Fall Precaution Comments: patient with history of falls Restrictions Weight Bearing Restrictions: No      Mobility  Bed Mobility Overal bed mobility: Modified Independent             General bed mobility comments: requires use of rails to perform supine>< sit independently with supervision for safety. Able to scoot up in bed in supine with assistance from bed rails  Transfers Overall transfer level: Needs assistance Equipment used: Rolling walker (2 wheeled) Transfers: Sit to/from Stand Sit to Stand: Min assist         General transfer comment: patient performed sit to stand transfer with min assist cues for upright posture with RW. Patient able to stand brief period before requesting to return to seated position due to fatigue.    Ambulation/Gait             General Gait Details: patient reports she was non-ambulatory prior to hospital admission. She did not attempt ambulation on this visit.   Stairs            Wheelchair Mobility    Modified Rankin (Stroke Patients Only)       Balance Overall balance assessment:  History of Falls;Needs assistance Sitting-balance support: Feet supported   Sitting balance - Comments: patient is able to maintain sitting balance, slight unsteadiness when patient reaches overhead with UEs but no LOB.     Standing balance support: Bilateral upper extremity supported   Standing balance comment: patient requires use of RW and min assist to maintain standing balance                              Pertinent Vitals/Pain      Home Living Family/patient expects to be discharged to:: Private residence Living Arrangements: Non-relatives/Friends Available Help at Discharge: Friend(s) Type of Home: House Home Access: Stairs to enter   [employer] of Steps: patient reports one step up to enter home. But she is currently non-ambulatory using wheelchair for mobility. Home Layout: One level Home Equipment: Walker - 2 wheels;Wheelchair - manual      Prior Function Level of Independence: Needs assistance   Gait / Transfers Assistance Needed: patient transfers with min +1 assist. Sit to stand. She is not ambulatory at this time.   ADL's / Homemaking Assistance Needed: patient reports she has friend who lives with her and provides assistance as needed.         Hand Dominance   Dominant Hand: Right    Extremity/Trunk Assessment                Communication   Communication: No difficulties  Cognition  Arousal/Alertness: Awake/alert Behavior During Therapy: WFL for tasks assessed/performed Overall Cognitive Status: Within Functional Limits for tasks assessed                                        General Comments      Exercises Other Exercises Other Exercises: patient performed LAQ and seated marching x 5 reps bilaterally   Assessment/Plan    PT Assessment All further PT needs can be met in the next venue of care  PT Problem List Decreased strength;Decreased mobility;Decreased activity tolerance;Decreased balance        PT Treatment Interventions      PT Goals (Current goals can be found in the Care Plan section)  Acute Rehab PT Goals Patient Stated Goal: to return home and improve strength PT Goal Formulation: With patient Time For Goal Achievement: 04/11/18 Potential to Achieve Goals: Fair    Frequency     Barriers to discharge        Co-evaluation               AM-PAC PT "6 Clicks" Daily Activity  Outcome Measure Difficulty turning over in bed (including adjusting bedclothes, sheets and blankets)?: Unable Difficulty moving from lying on back to sitting on the side of the bed? : Unable Difficulty sitting down on and standing up from a chair with arms (e.g., wheelchair, bedside commode, etc,.)?: Unable Help needed moving to and from a bed to chair (including a wheelchair)?: Total Help needed walking in hospital room?: Total Help needed climbing 3-5 steps with a railing? : Total 6 Click Score: 6    End of Session Equipment Utilized During Treatment: Gait belt Activity Tolerance: Patient limited by fatigue Patient left: with bed alarm set;in bed   PT Visit Diagnosis: Other abnormalities of gait and mobility (R26.89);Repeated falls (R29.6);Muscle weakness (generalized) (M62.81);History of falling (Z91.81);Difficulty in walking, not elsewhere classified (R26.2);Unsteadiness on feet (R26.81)    Time: 3125-0871 PT Time Calculation (min) (ACUTE ONLY): 15 min   Charges:   PT Evaluation $PT Eval Low Complexity: 1 Low     Veanna Dower, PT, GCS 03/31/18,10:03 AM

## 2018-03-31 NOTE — Plan of Care (Signed)
  Problem: Pain Managment: Goal: General experience of comfort will improve Outcome: Progressing   

## 2018-03-31 NOTE — Care Management (Signed)
Faxed patient's scripts for norvasc and levaquin to [facility]. Her companion will pick up these meds for patient

## 2018-03-31 NOTE — Plan of Care (Signed)

## 2018-04-07 ENCOUNTER — Telehealth: Payer: Self-pay | Admitting: *Deleted

## 2018-04-07 ENCOUNTER — Telehealth: Payer: Self-pay | Admitting: Licensed Clinical Social Worker

## 2018-04-07 NOTE — Telephone Encounter (Signed)
CSW contacted patient to follow up on Emmi call, patient states she is doing pretty good overall.  She said she has some up a down days, but she is able to change her thinking and help improve her mood.  Patient states home health agency has been very helpful in motivating her to continue working at her goals of being able to transfer more easily.  Patient expressed that she was approved for Medicaid and Medicare in November, but is not sure why it did not go through any longer.  Patient expressed the hospital has been very helpful with trying to meet her needs.  Patient is hopeful that her brothers will be able to come and visit in July or August, and her goal is to be able to transfer from her wheelchair to a car.  Patient was appreciative of phone call to check on her.  CSW signing off.  Jones Broom. [place], MSW, [place]  04/07/2018 6:02 PM

## 2018-04-07 NOTE — Telephone Encounter (Signed)
Identified on Emmi that patient did not fill her discharge prescriptions. Her scripts are awaiting pick up from [facility].  Patient had informed  CM that her live in companion was going to pick up the meds. patient also has documented on her discharge papers of appointments that were set up for July tenth and CM scheduled transportation and obtained money for the cost of transportation. CM left voicemail message for patient to address these concerns

## 2018-04-09 ENCOUNTER — Ambulatory Visit: Payer: Self-pay | Admitting: Licensed Clinical Social Worker

## 2018-04-09 ENCOUNTER — Ambulatory Visit: Payer: Self-pay | Admitting: Internal Medicine

## 2018-04-09 ENCOUNTER — Encounter: Payer: Self-pay | Admitting: Urology

## 2018-04-14 ENCOUNTER — Telehealth: Payer: Self-pay | Admitting: Pharmacy Technician

## 2018-04-14 NOTE — Telephone Encounter (Signed)
Received updated proof of income.  Patient eligible to receive medication assistance at [facility] through 2019, as long as eligibility requirements continue to be met.  [facility]

## 2018-05-14 ENCOUNTER — Encounter: Payer: Self-pay | Admitting: Urology

## 2018-05-14 ENCOUNTER — Ambulatory Visit: Payer: Self-pay | Admitting: Licensed Clinical Social Worker

## 2018-05-14 ENCOUNTER — Ambulatory Visit: Payer: Self-pay | Admitting: Internal Medicine

## 2018-05-21 ENCOUNTER — Ambulatory Visit: Payer: Self-pay | Admitting: Ophthalmology

## 2018-06-10 ENCOUNTER — Telehealth: Payer: Self-pay | Admitting: Adult Health Nurse Practitioner

## 2018-06-10 ENCOUNTER — Other Ambulatory Visit: Payer: Self-pay | Admitting: Internal Medicine

## 2018-06-10 NOTE — Telephone Encounter (Signed)
Patient called and set up 4 appointments

## 2018-06-11 ENCOUNTER — Ambulatory Visit: Payer: Self-pay | Admitting: Internal Medicine

## 2018-07-02 ENCOUNTER — Ambulatory Visit: Payer: Self-pay | Admitting: Licensed Clinical Social Worker

## 2018-07-02 ENCOUNTER — Ambulatory Visit: Payer: Self-pay | Admitting: Internal Medicine

## 2018-07-02 ENCOUNTER — Ambulatory Visit: Payer: Self-pay | Admitting: Ophthalmology

## 2018-07-04 ENCOUNTER — Other Ambulatory Visit: Payer: Self-pay | Admitting: Internal Medicine

## 2018-07-04 DIAGNOSIS — K219 Gastro-esophageal reflux disease without esophagitis: Secondary | ICD-10-CM

## 2018-07-09 ENCOUNTER — Ambulatory Visit: Payer: Self-pay | Admitting: Ophthalmology

## 2018-07-09 ENCOUNTER — Ambulatory Visit: Payer: Self-pay | Admitting: Licensed Clinical Social Worker

## 2018-07-10 ENCOUNTER — Telehealth: Payer: Self-pay | Admitting: Adult Health Nurse Practitioner

## 2018-07-10 NOTE — Telephone Encounter (Signed)
Returned pt call, no answer, left message. Pt wants to make an appt at the [facility]

## 2018-07-15 ENCOUNTER — Telehealth: Payer: Self-pay | Admitting: Licensed Clinical Social Worker

## 2018-07-15 NOTE — Telephone Encounter (Signed)
Adult Materials engineer Abigail contacted the social worker in regards to [place] report that she made for Ms. Schoneman's welfare.   Clinical social worker informed APS worker that she did not in fact make a report regarding Ms. Schippers's welfare. She explains that [name] has been scheduled with her around at least 12 to 15 times but has yet to actually come to the appointment. She explains that she has spoken with her on the phone but has yet to meet her in person. She noted that she has three appointments scheduled in clinic on November 13th and one on December 11th.

## 2018-07-17 ENCOUNTER — Ambulatory Visit: Payer: Self-pay | Admitting: Licensed Clinical Social Worker

## 2018-07-29 ENCOUNTER — Ambulatory Visit: Payer: Self-pay | Admitting: Gerontology

## 2018-07-29 ENCOUNTER — Institutional Professional Consult (permissible substitution): Payer: Self-pay | Admitting: Licensed Clinical Social Worker

## 2018-08-13 ENCOUNTER — Ambulatory Visit: Payer: Self-pay | Admitting: Internal Medicine

## 2018-08-13 ENCOUNTER — Ambulatory Visit: Payer: Self-pay | Admitting: Licensed Clinical Social Worker

## 2018-08-13 ENCOUNTER — Ambulatory Visit: Payer: Self-pay | Admitting: Urology

## 2018-08-13 ENCOUNTER — Encounter: Payer: Self-pay | Admitting: Internal Medicine

## 2018-08-13 VITALS — BP 77/57 | HR 62 | Temp 97.4°F | Ht 62.0 in | Wt 115.0 lb

## 2018-08-13 DIAGNOSIS — E119 Type 2 diabetes mellitus without complications: Secondary | ICD-10-CM

## 2018-08-13 DIAGNOSIS — K219 Gastro-esophageal reflux disease without esophagitis: Secondary | ICD-10-CM

## 2018-08-13 DIAGNOSIS — F411 Generalized anxiety disorder: Secondary | ICD-10-CM

## 2018-08-13 DIAGNOSIS — Z794 Long term (current) use of insulin: Principal | ICD-10-CM

## 2018-08-13 DIAGNOSIS — N329 Bladder disorder, unspecified: Secondary | ICD-10-CM

## 2018-08-13 DIAGNOSIS — F4310 Post-traumatic stress disorder, unspecified: Secondary | ICD-10-CM

## 2018-08-13 DIAGNOSIS — F332 Major depressive disorder, recurrent severe without psychotic features: Secondary | ICD-10-CM

## 2018-08-13 MED ORDER — INSULIN NPH ISOPHANE & REGULAR (70-30) 100 UNIT/ML ~~LOC~~ SUSP: 10.0000 [IU] | Freq: Two times a day (BID) | SUBCUTANEOUS | 6 refills | Status: DC

## 2018-08-13 MED ORDER — PANTOPRAZOLE SODIUM 40 MG PO TBEC
40.0000 mg | DELAYED_RELEASE_TABLET | Freq: Every day | ORAL | 3 refills | Status: DC
Start: 2018-08-13 — End: 2019-09-08

## 2018-08-13 MED ORDER — INSULIN NPH ISOPHANE & REGULAR (70-30) 100 UNIT/ML ~~LOC~~ SUSP
10.0000 [IU] | Freq: Two times a day (BID) | SUBCUTANEOUS | 6 refills | Status: DC
Start: 2018-08-13 — End: 2019-09-08

## 2018-08-13 MED ORDER — INSULIN PEN NEEDLE 32G X 6 MM MISC: 1.0000 [IU] | 3 refills | Status: DC | PRN

## 2018-08-13 MED ORDER — POTASSIUM CHLORIDE ER 10 MEQ PO TBCR: 10.0000 meq | EXTENDED_RELEASE_TABLET | Freq: Every day | ORAL | 3 refills | Status: DC

## 2018-08-13 MED ORDER — NITROFURANTOIN MONOHYD MACRO 100 MG PO CAPS
100.0000 mg | ORAL_CAPSULE | Freq: Every day | ORAL | 3 refills | Status: DC
Start: 2018-08-13 — End: 2019-08-21
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
## 2018-08-13 NOTE — Progress Notes (Signed)
Subjective:    Patient ID: Julia Shea, female    DOB: [birth year], 62 y.o.   MRN: [id number]  HPI   Pt claims that a UTI almost killed her last November. She stated she had a hole in her bladder. Pt says she still has UTI's, but she no longer has anymore antibiotics.   Pt explains that she has trouble with bowel movements that she cannot control. She has to wear Depends.   Had been receiving care in [facility] and returned to [facility] in February.  She states she was advised to take BP pill (Lisinopril) when it is elevated. She checks it with a machine at home. It has not been elevated recently.  Pt states she takes Potassium every day.  Pt claims she takes Trazodone to sleep but she says that it does not help. Pt to see [facility] LCSW today.   Pt needs most of her medications refilled.    Chief Complaint  Patient presents with  . Follow-up    Left hip pain, long lasting UTI, concerns about MRSA    Review of Systems Patient Active Problem List   Diagnosis Date Noted  . Protein-calorie malnutrition, severe 03/31/2018  . Hypokalemia 03/29/2018  . Diabetes mellitus type 2, with complication, on long term insulin pump ([place])   . Nausea & vomiting   . Chronic indwelling Foley catheter   . Bladder problem   . Rotator cuff tear arthropathy, left   . Malnutrition of moderate degree 01/24/2018  . Pressure injury of skin 12/22/2017  . Hepatic steatosis 05/15/2013  . Abdominal pain, right upper quadrant 05/07/2013  . Breast cancer screening 11/14/2011  . Hypertriglyceridemia 10/30/2011  . Depression 10/26/2011  . Diabetes mellitus type 2, uncontrolled ([place]) 10/26/2011  . Hypertension 10/26/2011   Allergies as of 08/13/2018      Reactions   Penicillins    Has patient had a PCN reaction causing immediate rash, facial/tongue/throat swelling, SOB or lightheadedness with hypotension: Yes Has patient had a PCN reaction causing severe rash involving mucus membranes or skin necrosis:  Yes Has patient had a PCN reaction that required hospitalization: Yes Has patient had a PCN reaction occurring within the last 10 years: Yes If all of the above answers are "NO", then may proceed with Cephalosporin use.   Sulfa Antibiotics       Medication List        Accurate as of 08/13/18 10:22 AM. Always use your most recent med list.          acetaminophen 500 MG tablet Commonly known as:  TYLENOL Take 2 tablets (1,000 mg total) by mouth at bedtime as needed.   amLODipine 10 MG tablet Commonly known as:  NORVASC Take 1 tablet (10 mg total) by mouth daily.   BLOOD GLUCOSE METER DISPOSABLE Devi 4 strips by Does not apply route 4 (four) times daily - after meals and at bedtime.   Calcium Carbonate Antacid 600 MG chewable tablet Chew 1 tablet (600 mg total) by mouth every 6 (six) hours as needed for heartburn.   insulin NPH-regular Human (70-30) 100 UNIT/ML injection Inject 10 Units into the skin 2 (two) times daily with a meal.   Insulin Pen Needle 32G X 6 MM Misc 1 Units by Does not apply route as needed.   Insulin Syringes (Disposable) U-100 0.3 ML Misc 1 Syringe by Does not apply route 2 (two) times daily.   levofloxacin 500 MG tablet Commonly known as:  LEVAQUIN Take 1 tablet (500  mg total) by mouth daily.   lisinopril 2.5 MG tablet Commonly known as:  PRINIVIL,ZESTRIL Take 4 tablets (10 mg total) by mouth daily.   multivitamin with minerals tablet Take 1 tablet by mouth daily.   nitrofurantoin (macrocrystal-monohydrate) 100 MG capsule Commonly known as:  MACROBID Take 1 capsule (100 mg total) by mouth at bedtime.   pantoprazole 40 MG tablet Commonly known as:  PROTONIX Take 1 tablet (40 mg total) by mouth daily.   potassium chloride 10 MEQ tablet Commonly known as:  K-DUR Take 1 tablet (10 mEq total) by mouth daily.   predniSONE 20 MG tablet Commonly known as:  DELTASONE Take 20 mg by mouth daily with breakfast.   traMADol 50 MG tablet Commonly  known as:  ULTRAM Take 1 tablet (50 mg total) by mouth every 6 (six) hours as needed for moderate pain.   traZODone 50 MG tablet Commonly known as:  DESYREL TAKE ONE TABLET BY MOUTH AT BEDTIME          Objective:   Physical Exam  Constitutional: She is oriented to person, place, and time.  Cardiovascular: Normal rate, regular rhythm and normal heart sounds.  Pulmonary/Chest: Effort normal and breath sounds normal.  Neurological: She is alert and oriented to person, place, and time.  No edema present in the lower extremities.  BP (!) 77/57   Pulse 62   Temp (!) 97.4 F (36.3 C) (Oral)   Ht 5\' 2"  (1.575 m)   Wt 115 lb (52.2 kg)   BMI 21.03 kg/m        Assessment & Plan:   1. Type 2 diabetes mellitus without complication, with long-term current use of insulin (HCC) Pt does not have glucometer and has not been checking FSBS. [facility] will provide glucometer today.Labs will also be checked today.   Check Today:  - Comprehensive metabolic panel - CBC - Hemoglobin A1c - Lipid panel - Urinalysis - Urine Culture  Refill Today:  - Insulin Pen Needle (NOVOFINE) 32G X 6 MM MISC; 1 Units by Does not apply route as needed.  Dispense: 100 each; Refill: 3 - potassium chloride (K-DUR) 10 MEQ tablet; Take 1 tablet (10 mEq total) by mouth daily.  Dispense: 90 tablet; Refill: 3 - TSH - insulin NPH-regular Human (70-30) 100 UNIT/ML injection; Inject 10 Units into the skin 2 (two) times daily with a meal.  Dispense: 10 mL; Refill: 6  2. Gastroesophageal reflux disease without esophagitis Controlled.   Refill Today:  - pantoprazole (PROTONIX) 40 MG tablet; Take 1 tablet (40 mg total) by mouth daily.  Dispense: 90 tablet; Refill: 3  3. Bladder problem Pt has an appointment today with [facility] Urologist, Dr. Madelin Headings.    Refill Today:  - nitrofurantoin, macrocrystal-monohydrate, (MACROBID) 100 MG capsule; Take 1 capsule (100 mg total) by mouth at bedtime.  Dispense: 90 capsule; Refill:  3   Pt also has appt. with Julian Hy, LCSW to discuss pt's concerns about anxiety.   Follow up in 3 Months to review labs and medication regimen.

## 2018-08-14 LAB — CBC
HEMOGLOBIN: 10.7 g/dL — AB (ref 11.1–15.9)
Hematocrit: 32.2 % — ABNORMAL LOW (ref 34.0–46.6)
MCH: 28.7 pg (ref 26.6–33.0)
MCHC: 33.2 g/dL (ref 31.5–35.7)
MCV: 86 fL (ref 79–97)
Platelets: 365 10*3/uL (ref 150–450)
RBC: 3.73 x10E6/uL — ABNORMAL LOW (ref 3.77–5.28)
RDW: 14.4 % (ref 12.3–15.4)
WBC: 14.2 10*3/uL — ABNORMAL HIGH (ref 3.4–10.8)

## 2018-08-14 LAB — COMPREHENSIVE METABOLIC PANEL
A/G RATIO: 1.9 (ref 1.2–2.2)
ALT: 10 IU/L (ref 0–32)
AST: 13 IU/L (ref 0–40)
Albumin: 3.9 g/dL (ref 3.6–4.8)
Alkaline Phosphatase: 66 IU/L (ref 39–117)
BUN/Creatinine Ratio: 23 (ref 12–28)
BUN: 23 mg/dL (ref 8–27)
Bilirubin Total: 0.2 mg/dL (ref 0.0–1.2)
CALCIUM: 9.8 mg/dL (ref 8.7–10.3)
CO2: 22 mmol/L (ref 20–29)
Chloride: 101 mmol/L (ref 96–106)
Creatinine, Ser: 1.02 mg/dL — ABNORMAL HIGH (ref 0.57–1.00)
GFR, EST AFRICAN AMERICAN: 68 mL/min/{1.73_m2} (ref >59)
GFR, EST NON AFRICAN AMERICAN: 59 mL/min/{1.73_m2} — AB (ref >59)
GLUCOSE: 230 mg/dL — AB (ref 65–99)
Globulin, Total: 2.1 g/dL (ref 1.5–4.5)
Potassium: 5.1 mmol/L (ref 3.5–5.2)
Sodium: 141 mmol/L (ref 134–144)
TOTAL PROTEIN: 6 g/dL (ref 6.0–8.5)

## 2018-08-14 LAB — HEMOGLOBIN A1C
Est. average glucose Bld gHb Est-mCnc: 157 mg/dL
Hgb A1c MFr Bld: 7.1 % — ABNORMAL HIGH (ref 4.8–5.6)

## 2018-08-14 LAB — LIPID PANEL
CHOL/HDL RATIO: 6.5 ratio — AB (ref 0.0–4.4)
Cholesterol, Total: 229 mg/dL — ABNORMAL HIGH (ref 100–199)
HDL: 35 mg/dL — AB (ref >39)
LDL CALC: 150 mg/dL — AB (ref 0–99)
Triglycerides: 219 mg/dL — ABNORMAL HIGH (ref 0–149)
VLDL CHOLESTEROL CAL: 44 mg/dL — AB (ref 5–40)

## 2018-08-14 LAB — TSH: TSH: 3.12 u[IU]/mL (ref 0.450–4.500)

## 2018-09-03 ENCOUNTER — Ambulatory Visit: Payer: Self-pay | Admitting: Licensed Clinical Social Worker

## 2018-09-03 DIAGNOSIS — F4310 Post-traumatic stress disorder, unspecified: Secondary | ICD-10-CM

## 2018-09-03 DIAGNOSIS — F332 Major depressive disorder, recurrent severe without psychotic features: Secondary | ICD-10-CM

## 2018-09-03 DIAGNOSIS — F411 Generalized anxiety disorder: Secondary | ICD-10-CM
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
## 2018-09-10 ENCOUNTER — Ambulatory Visit: Payer: Self-pay | Admitting: Ophthalmology

## 2018-09-10 ENCOUNTER — Ambulatory Visit: Payer: Self-pay | Admitting: Licensed Clinical Social Worker

## 2018-09-11 ENCOUNTER — Ambulatory Visit: Payer: Self-pay | Admitting: Ophthalmology

## 2018-09-11 ENCOUNTER — Ambulatory Visit: Payer: Self-pay | Admitting: Licensed Clinical Social Worker

## 2018-10-30 ENCOUNTER — Ambulatory Visit: Payer: Self-pay | Admitting: Licensed Clinical Social Worker

## 2018-10-30 ENCOUNTER — Ambulatory Visit: Payer: Self-pay | Admitting: Ophthalmology

## 2018-10-30 ENCOUNTER — Other Ambulatory Visit: Payer: Self-pay | Admitting: Ophthalmology

## 2018-10-30 DIAGNOSIS — F332 Major depressive disorder, recurrent severe without psychotic features: Secondary | ICD-10-CM

## 2018-10-30 DIAGNOSIS — F411 Generalized anxiety disorder: Secondary | ICD-10-CM

## 2018-10-30 DIAGNOSIS — F4310 Post-traumatic stress disorder, unspecified: Secondary | ICD-10-CM

## 2018-10-30 LAB — HM DIABETES EYE EXAM

## 2018-10-30 NOTE — BH Specialist Note (Signed)
Integrated Behavioral Health Follow Up Visit  MRN: [id number] Name: Julia Shea  Number of [place] Clinician visits: 1/6   Type of Service: [place] Interpretor:No. Interpretor Name and Language: Not applicable.  SUBJECTIVE: Julia Shea is a 63 y.o. female accompanied by her best friend. Patient was referred by Adult Protective Services (APS) for mental health. Patient reports the following symptoms/concerns:  She reports that she has been doing okay. She notes that she feels like the Trazodone needs to be increased because of the fact that she is still having a hard time sleeping. She notes that she feels like her anxiety is still high and is having a hard time relaxing. She explains that her goal is to be walking by summertime. She explains that she would also like to be able to grow a garden. She notes that a few family members are planning to visit her. She denies suicidal and homicidal thoughts.  Duration of problem:; Severity of problem: moderate  OBJECTIVE: Mood: Euthymic and Affect: Appropriate Risk of harm to self or others: No plan to harm self or others  LIFE CONTEXT: Family and Social: See above. School/Work: Julia Shea is applying for disability. Self-Care: Julia Shea is trying to do her own ADL's. Life Changes: Julia Shea health has taken a toll on her.   GOALS ADDRESSED: Patient will: 1.  Reduce symptoms of: anxiety  2.  Increase knowledge and/or ability of: coping skills  3.  Demonstrate ability to: Increase healthy adjustment to current life circumstances  INTERVENTIONS: Interventions utilized:  Brief CBT was utilized by focusing on the patient's anxiety and affect on normal cognition. Clinician discussed with the patient regarding how she has been doing since the last follow up session. Clinician explained that she will have a case consultation with Dr. Octavia Heir, MD, psychiatric consultant on 11/05/2018 to  discuss increasing her Trazodone and potentially adding something for her symptoms of anxiety. Clinician discussed with the patient regarding what a typical day is like for her. Clinician discussed with the patient regarding how she is staying busy. Clinician encouraged the patient to continue to utilize her coping skills.  Standardized Assessments completed: GAD-7 and PHQ 9  ASSESSMENT: Patient currently experiencing symptoms of anxiety and is having a hard time sleeping.   Patient may benefit from utilizing coping skills and trying to stay busy.  PLAN: 1. Follow up with behavioral health clinician on : 1 month or earlier if needed. 2. Behavioral recommendations: Case consultation with Dr. Octavia Heir, MD, psychiatric consultant recommended that Julia Shea's Trazodone be increased to 100 mg at bedtime and see if that helps with the insomnia and anxiety. 3. Referral(s): [place] (In Clinic) 4. "From scale of 1-10, how likely are you to follow plan?": [place], LCSW

## 2018-11-11 ENCOUNTER — Other Ambulatory Visit: Payer: Self-pay

## 2018-11-11 MED ORDER — TRAZODONE HCL 100 MG PO TABS: 100.0000 mg | ORAL_TABLET | Freq: Every day | ORAL | 2 refills | Status: DC

## 2018-11-12 ENCOUNTER — Ambulatory Visit: Payer: Self-pay | Admitting: Internal Medicine

## 2018-11-26 ENCOUNTER — Ambulatory Visit: Payer: Self-pay | Admitting: Licensed Clinical Social Worker

## 2018-11-26 ENCOUNTER — Ambulatory Visit: Payer: Self-pay | Admitting: Internal Medicine

## 2019-08-21 ENCOUNTER — Inpatient Hospital Stay
Admission: EM | Admit: 2019-08-21 | Discharge: 2019-09-09 | DRG: 987 | Disposition: A | Discharge status: Skilled Nursing Facility | Payer: Medicare Other | Attending: Internal Medicine | Admitting: Internal Medicine

## 2019-08-21 ENCOUNTER — Encounter: Payer: Self-pay | Admitting: Emergency Medicine

## 2019-08-21 ENCOUNTER — Emergency Department: Payer: Medicare Other

## 2019-08-21 ENCOUNTER — Other Ambulatory Visit: Payer: Self-pay

## 2019-08-21 ENCOUNTER — Inpatient Hospital Stay: Payer: Medicare Other

## 2019-08-21 DIAGNOSIS — Z515 Encounter for palliative care: Secondary | ICD-10-CM | POA: Diagnosis present

## 2019-08-21 DIAGNOSIS — Z681 Body mass index (BMI) 19 or less, adult: Secondary | ICD-10-CM

## 2019-08-21 DIAGNOSIS — I38 Endocarditis, valve unspecified: Secondary | ICD-10-CM | POA: Diagnosis present

## 2019-08-21 DIAGNOSIS — K521 Toxic gastroenteritis and colitis: Secondary | ICD-10-CM | POA: Diagnosis not present

## 2019-08-21 DIAGNOSIS — I129 Hypertensive chronic kidney disease with stage 1 through stage 4 chronic kidney disease, or unspecified chronic kidney disease: Secondary | ICD-10-CM | POA: Diagnosis present

## 2019-08-21 DIAGNOSIS — Z880 Allergy status to penicillin: Secondary | ICD-10-CM | POA: Diagnosis not present

## 2019-08-21 DIAGNOSIS — J850 Gangrene and necrosis of lung: Secondary | ICD-10-CM | POA: Diagnosis not present

## 2019-08-21 DIAGNOSIS — T3695XA Adverse effect of unspecified systemic antibiotic, initial encounter: Secondary | ICD-10-CM | POA: Diagnosis not present

## 2019-08-21 DIAGNOSIS — E1165 Type 2 diabetes mellitus with hyperglycemia: Secondary | ICD-10-CM | POA: Diagnosis present

## 2019-08-21 DIAGNOSIS — E871 Hypo-osmolality and hyponatremia: Principal | ICD-10-CM | POA: Diagnosis present

## 2019-08-21 DIAGNOSIS — K219 Gastro-esophageal reflux disease without esophagitis: Secondary | ICD-10-CM | POA: Diagnosis present

## 2019-08-21 DIAGNOSIS — N63 Unspecified lump in unspecified breast: Secondary | ICD-10-CM | POA: Diagnosis not present

## 2019-08-21 DIAGNOSIS — E1122 Type 2 diabetes mellitus with diabetic chronic kidney disease: Secondary | ICD-10-CM | POA: Diagnosis present

## 2019-08-21 DIAGNOSIS — R918 Other nonspecific abnormal finding of lung field: Secondary | ICD-10-CM

## 2019-08-21 DIAGNOSIS — IMO0002 Reserved for concepts with insufficient information to code with codable children: Secondary | ICD-10-CM | POA: Diagnosis present

## 2019-08-21 DIAGNOSIS — R1032 Left lower quadrant pain: Secondary | ICD-10-CM

## 2019-08-21 DIAGNOSIS — L89312 Pressure ulcer of right buttock, stage 2: Secondary | ICD-10-CM | POA: Diagnosis present

## 2019-08-21 DIAGNOSIS — Z20828 Contact with and (suspected) exposure to other viral communicable diseases: Secondary | ICD-10-CM | POA: Diagnosis present

## 2019-08-21 DIAGNOSIS — K529 Noninfective gastroenteritis and colitis, unspecified: Secondary | ICD-10-CM | POA: Diagnosis present

## 2019-08-21 DIAGNOSIS — R197 Diarrhea, unspecified: Secondary | ICD-10-CM

## 2019-08-21 DIAGNOSIS — I959 Hypotension, unspecified: Secondary | ICD-10-CM | POA: Diagnosis present

## 2019-08-21 DIAGNOSIS — E860 Dehydration: Secondary | ICD-10-CM | POA: Diagnosis present

## 2019-08-21 DIAGNOSIS — E43 Unspecified severe protein-calorie malnutrition: Secondary | ICD-10-CM | POA: Diagnosis present

## 2019-08-21 DIAGNOSIS — I76 Septic arterial embolism: Secondary | ICD-10-CM | POA: Diagnosis not present

## 2019-08-21 DIAGNOSIS — N179 Acute kidney failure, unspecified: Secondary | ICD-10-CM | POA: Diagnosis present

## 2019-08-21 DIAGNOSIS — M25552 Pain in left hip: Secondary | ICD-10-CM | POA: Diagnosis present

## 2019-08-21 DIAGNOSIS — F329 Major depressive disorder, single episode, unspecified: Secondary | ICD-10-CM | POA: Diagnosis present

## 2019-08-21 DIAGNOSIS — D631 Anemia in chronic kidney disease: Secondary | ICD-10-CM | POA: Diagnosis present

## 2019-08-21 DIAGNOSIS — J984 Other disorders of lung: Secondary | ICD-10-CM

## 2019-08-21 DIAGNOSIS — J188 Other pneumonia, unspecified organism: Secondary | ICD-10-CM | POA: Diagnosis present

## 2019-08-21 DIAGNOSIS — D638 Anemia in other chronic diseases classified elsewhere: Secondary | ICD-10-CM | POA: Diagnosis not present

## 2019-08-21 DIAGNOSIS — Z833 Family history of diabetes mellitus: Secondary | ICD-10-CM

## 2019-08-21 DIAGNOSIS — N183 Chronic kidney disease, stage 3 unspecified: Secondary | ICD-10-CM | POA: Diagnosis present

## 2019-08-21 DIAGNOSIS — J189 Pneumonia, unspecified organism: Secondary | ICD-10-CM

## 2019-08-21 DIAGNOSIS — H548 Legal blindness, as defined in USA: Secondary | ICD-10-CM | POA: Diagnosis present

## 2019-08-21 DIAGNOSIS — M25559 Pain in unspecified hip: Secondary | ICD-10-CM

## 2019-08-21 DIAGNOSIS — Z66 Do not resuscitate: Secondary | ICD-10-CM | POA: Diagnosis present

## 2019-08-21 DIAGNOSIS — R296 Repeated falls: Secondary | ICD-10-CM | POA: Diagnosis present

## 2019-08-21 DIAGNOSIS — D763 Other histiocytosis syndromes: Secondary | ICD-10-CM | POA: Diagnosis present

## 2019-08-21 DIAGNOSIS — L89322 Pressure ulcer of left buttock, stage 2: Secondary | ICD-10-CM | POA: Diagnosis present

## 2019-08-21 DIAGNOSIS — N289 Disorder of kidney and ureter, unspecified: Secondary | ICD-10-CM

## 2019-08-21 DIAGNOSIS — A4902 Methicillin resistant Staphylococcus aureus infection, unspecified site: Secondary | ICD-10-CM | POA: Diagnosis not present

## 2019-08-21 DIAGNOSIS — K0889 Other specified disorders of teeth and supporting structures: Secondary | ICD-10-CM | POA: Diagnosis not present

## 2019-08-21 DIAGNOSIS — J440 Chronic obstructive pulmonary disease with acute lower respiratory infection: Secondary | ICD-10-CM | POA: Diagnosis present

## 2019-08-21 DIAGNOSIS — N390 Urinary tract infection, site not specified: Secondary | ICD-10-CM | POA: Diagnosis present

## 2019-08-21 DIAGNOSIS — D869 Sarcoidosis, unspecified: Secondary | ICD-10-CM | POA: Diagnosis present

## 2019-08-21 DIAGNOSIS — Z794 Long term (current) use of insulin: Secondary | ICD-10-CM

## 2019-08-21 DIAGNOSIS — Z79899 Other long term (current) drug therapy: Secondary | ICD-10-CM

## 2019-08-21 DIAGNOSIS — E1136 Type 2 diabetes mellitus with diabetic cataract: Secondary | ICD-10-CM | POA: Diagnosis present

## 2019-08-21 DIAGNOSIS — Z8614 Personal history of Methicillin resistant Staphylococcus aureus infection: Secondary | ICD-10-CM

## 2019-08-21 DIAGNOSIS — E876 Hypokalemia: Secondary | ICD-10-CM | POA: Diagnosis present

## 2019-08-21 DIAGNOSIS — Z8701 Personal history of pneumonia (recurrent): Secondary | ICD-10-CM

## 2019-08-21 DIAGNOSIS — D509 Iron deficiency anemia, unspecified: Secondary | ICD-10-CM | POA: Diagnosis present

## 2019-08-21 DIAGNOSIS — Z882 Allergy status to sulfonamides status: Secondary | ICD-10-CM

## 2019-08-21 DIAGNOSIS — Z9889 Other specified postprocedural states: Secondary | ICD-10-CM

## 2019-08-21 DIAGNOSIS — E11649 Type 2 diabetes mellitus with hypoglycemia without coma: Secondary | ICD-10-CM | POA: Diagnosis not present

## 2019-08-21 DIAGNOSIS — E119 Type 2 diabetes mellitus without complications: Secondary | ICD-10-CM

## 2019-08-21 LAB — CBC
HCT: 27.6 % — ABNORMAL LOW (ref 36.0–46.0)
Hemoglobin: 9.7 g/dL — ABNORMAL LOW (ref 12.0–15.0)
MCH: 25.5 pg — ABNORMAL LOW (ref 26.0–34.0)
MCHC: 35.1 g/dL (ref 30.0–36.0)
MCV: 72.6 fL — ABNORMAL LOW (ref 80.0–100.0)
Platelets: 436 10*3/uL — ABNORMAL HIGH (ref 150–400)
RBC: 3.8 MIL/uL — ABNORMAL LOW (ref 3.87–5.11)
RDW: 13.2 % (ref 11.5–15.5)
WBC: 20 10*3/uL — ABNORMAL HIGH (ref 4.0–10.5)
nRBC: 0 % (ref 0.0–0.2)

## 2019-08-21 LAB — GLUCOSE, CAPILLARY
Glucose-Capillary: 353 mg/dL — ABNORMAL HIGH (ref 70–99)
Glucose-Capillary: 284 mg/dL — ABNORMAL HIGH (ref 70–99)
Glucose-Capillary: 115 mg/dL — ABNORMAL HIGH (ref 70–99)

## 2019-08-21 LAB — COMPREHENSIVE METABOLIC PANEL
ALT: 13 U/L (ref 0–44)
AST: 10 U/L — ABNORMAL LOW (ref 15–41)
Albumin: 2.7 g/dL — ABNORMAL LOW (ref 3.5–5.0)
Alkaline Phosphatase: 171 U/L — ABNORMAL HIGH (ref 38–126)
Anion gap: 15 (ref 5–15)
BUN: 64 mg/dL — ABNORMAL HIGH (ref 8–23)
CO2: 21 mmol/L — ABNORMAL LOW (ref 22–32)
Calcium: 9.1 mg/dL (ref 8.9–10.3)
Chloride: 86 mmol/L — ABNORMAL LOW (ref 98–111)
Creatinine, Ser: 1.85 mg/dL — ABNORMAL HIGH (ref 0.44–1.00)
GFR calc Af Amer: 33 mL/min — ABNORMAL LOW (ref >60)
GFR calc non Af Amer: 28 mL/min — ABNORMAL LOW (ref >60)
Glucose, Bld: 448 mg/dL — ABNORMAL HIGH (ref 70–99)
Potassium: 4.9 mmol/L (ref 3.5–5.1)
Sodium: 122 mmol/L — ABNORMAL LOW (ref 135–145)
Total Bilirubin: 0.5 mg/dL (ref 0.3–1.2)
Total Protein: 7.6 g/dL (ref 6.5–8.1)

## 2019-08-21 LAB — URINALYSIS, COMPLETE (UACMP) WITH MICROSCOPIC
Bilirubin Urine: NEGATIVE
Glucose, UA: >=500 mg/dL — AB
Ketones, ur: NEGATIVE mg/dL
Nitrite: NEGATIVE
Protein, ur: 30 mg/dL — AB
RBC / HPF: >50 RBC/hpf — ABNORMAL HIGH (ref 0–5)
Specific Gravity, Urine: 1.015 (ref 1.005–1.030)
Squamous Epithelial / LPF: NONE SEEN (ref 0–5)
WBC, UA: >50 WBC/hpf — ABNORMAL HIGH (ref 0–5)
pH: 5 (ref 5.0–8.0)

## 2019-08-21 LAB — LIPASE, BLOOD: Lipase: 20 U/L (ref 11–51)

## 2019-08-21 LAB — CREATININE, URINE, RANDOM: Creatinine, Urine: 43 mg/dL

## 2019-08-21 LAB — OSMOLALITY, URINE: Osmolality, Ur: 454 mOsm/kg (ref 300–900)

## 2019-08-21 LAB — TROPONIN I (HIGH SENSITIVITY)
Troponin I (High Sensitivity): 7 ng/L (ref <18)
Troponin I (High Sensitivity): 8 ng/L (ref <18)

## 2019-08-21 LAB — OSMOLALITY: Osmolality: 294 mOsm/kg (ref 275–295)

## 2019-08-21 LAB — POC SARS CORONAVIRUS 2 AG: SARS Coronavirus 2 Ag: NEGATIVE

## 2019-08-21 LAB — SODIUM, URINE, RANDOM: Sodium, Ur: 28 mmol/L

## 2019-08-21 LAB — CK: Total CK: 138 U/L (ref 38–234)

## 2019-08-21 MED ORDER — ACETAMINOPHEN 325 MG PO TABS
650.0000 mg | ORAL_TABLET | Freq: Four times a day (QID) | ORAL | Status: DC | PRN
  Administered 2019-08-25 – 2019-08-28 (×5): 650 mg via ORAL
  Filled 2019-08-21 (×7): qty 2

## 2019-08-21 MED ORDER — SODIUM CHLORIDE 0.9 % IV BOLUS
1000.0000 mL | Freq: Once | INTRAVENOUS | Status: AC
  Administered 2019-08-21: 1000 mL via INTRAVENOUS

## 2019-08-21 MED ORDER — LEVOFLOXACIN IN D5W 250 MG/50ML IV SOLN
250.0000 mg | INTRAVENOUS | Status: DC
  Filled 2019-08-21: qty 50

## 2019-08-21 MED ORDER — SODIUM CHLORIDE 0.9 % IV BOLUS
500.0000 mL | Freq: Once | INTRAVENOUS | Status: AC
  Administered 2019-08-21: 500 mL via INTRAVENOUS

## 2019-08-21 MED ORDER — INSULIN ASPART 100 UNIT/ML ~~LOC~~ SOLN
0.0000 [IU] | Freq: Three times a day (TID) | SUBCUTANEOUS | Status: DC
  Administered 2019-08-21: 9 [IU] via SUBCUTANEOUS
  Administered 2019-08-22: 1 [IU] via SUBCUTANEOUS
  Administered 2019-08-22: 2 [IU] via SUBCUTANEOUS
  Administered 2019-08-22: 13:00:00 1 [IU] via SUBCUTANEOUS
  Administered 2019-08-23 (×2): 3 [IU] via SUBCUTANEOUS
  Administered 2019-08-24 (×2): 2 [IU] via SUBCUTANEOUS
  Administered 2019-08-24: 09:00:00 1 [IU] via SUBCUTANEOUS
  Administered 2019-08-25: 3 [IU] via SUBCUTANEOUS
  Administered 2019-08-25 – 2019-08-26 (×2): 2 [IU] via SUBCUTANEOUS
  Administered 2019-08-26 – 2019-08-27 (×2): 5 [IU] via SUBCUTANEOUS
  Administered 2019-08-27: 0.3 [IU] via SUBCUTANEOUS
  Administered 2019-08-28: 14:00:00 1 [IU] via SUBCUTANEOUS
  Administered 2019-08-28: 2 [IU] via SUBCUTANEOUS
  Administered 2019-08-29 – 2019-09-01 (×3): 1 [IU] via SUBCUTANEOUS
  Administered 2019-09-01 – 2019-09-02 (×2): 2 [IU] via SUBCUTANEOUS
  Administered 2019-09-03: 1 [IU] via SUBCUTANEOUS
  Administered 2019-09-03 – 2019-09-05 (×4): 2 [IU] via SUBCUTANEOUS
  Administered 2019-09-05 (×2): 1 [IU] via SUBCUTANEOUS
  Administered 2019-09-06 (×3): 3 [IU] via SUBCUTANEOUS
  Administered 2019-09-07: 18:00:00 1 [IU] via SUBCUTANEOUS
  Administered 2019-09-07: 2 [IU] via SUBCUTANEOUS
  Administered 2019-09-08: 3 [IU] via SUBCUTANEOUS
  Administered 2019-09-08: 2 [IU] via SUBCUTANEOUS
  Administered 2019-09-08 – 2019-09-09 (×2): 5 [IU] via SUBCUTANEOUS
  Administered 2019-09-09: 1 [IU] via SUBCUTANEOUS
  Filled 2019-08-21 (×36): qty 1

## 2019-08-21 MED ORDER — POLYETHYLENE GLYCOL 3350 17 G PO PACK
17.0000 g | PACK | Freq: Every day | ORAL | Status: DC | PRN
  Administered 2019-08-24: 17 g via ORAL
  Filled 2019-08-21: qty 1

## 2019-08-21 MED ORDER — INSULIN GLARGINE 100 UNIT/ML ~~LOC~~ SOLN
10.0000 [IU] | Freq: Every day | SUBCUTANEOUS | Status: DC
  Administered 2019-08-21 – 2019-08-29 (×8): 10 [IU] via SUBCUTANEOUS
  Filled 2019-08-21 (×11): qty 0.1

## 2019-08-21 MED ORDER — HEPARIN SODIUM (PORCINE) 5000 UNIT/ML IJ SOLN
5000.0000 [IU] | Freq: Three times a day (TID) | INTRAMUSCULAR | Status: DC
  Administered 2019-08-21 – 2019-08-25 (×11): 5000 [IU] via SUBCUTANEOUS
  Filled 2019-08-21 (×11): qty 1

## 2019-08-21 MED ORDER — ACETAMINOPHEN 650 MG RE SUPP: 650.0000 mg | Freq: Four times a day (QID) | RECTAL | Status: DC | PRN

## 2019-08-21 MED ORDER — LEVOFLOXACIN IN D5W 500 MG/100ML IV SOLN
500.0000 mg | Freq: Once | INTRAVENOUS | Status: AC
  Administered 2019-08-21: 500 mg via INTRAVENOUS
  Filled 2019-08-21: qty 100

## 2019-08-21 MED ORDER — PANTOPRAZOLE SODIUM 40 MG PO TBEC
40.0000 mg | DELAYED_RELEASE_TABLET | Freq: Every day | ORAL | Status: DC
  Administered 2019-08-22 – 2019-09-09 (×16): 40 mg via ORAL
  Filled 2019-08-21 (×16): qty 1

## 2019-08-21 MED ORDER — SODIUM CHLORIDE 0.9 % IV SOLN
INTRAVENOUS | Status: DC
  Administered 2019-08-21 – 2019-08-22 (×2): via INTRAVENOUS

## 2019-08-21 NOTE — ED Notes (Signed)
Pt requested we contact with her brother, Abe People, at [phone], and let him know that she is in the hospital. She gave her permission to discuss results, condition, and careplan.

## 2019-08-21 NOTE — ED Notes (Signed)
Spoke with Julia oamu, np regarding continued hypotension. No new verbal orders received. Julia Shea states will review chart and provide orders as necessary.

## 2019-08-21 NOTE — ED Notes (Signed)
Lab here for blood draw

## 2019-08-21 NOTE — ED Notes (Signed)
Dr. Ellender Hose notified of hypotension, order for ns bolus received.

## 2019-08-21 NOTE — ED Notes (Signed)
Pt respositioned in bed, call bell provided to pt at right side. Pt with purewick in place. Pt wearing mask over nose and mouth. Bed in low and locked position with siderails upx2. Pt declines offer for non skid socks. Pt states she is nonambulatory at home and does not walk.

## 2019-08-21 NOTE — ED Triage Notes (Signed)
Pt arrival via [facility] from home due to being called for hip pain. EMS states that the patient was at home when she sent her friend who she was living with the neighbors house to call 911 for her hip. EMS found her at the house on the couch covered in her own feces, bugs, and saw what they think was bedbugs.  Pt states that she's had hip pain for about a week and that she hasn't walked much since then. Pt states she fell but is unable to clarify when and how.   EMS states patient is non-compliant with any medications and that she is a diabetic.   EMS VS- BP: 80/50 Bs- 497

## 2019-08-21 NOTE — ED Provider Notes (Addendum)
[facility] Emergency Department Provider Note  Time seen: 1:15 PM  I have reviewed the triage vital signs and the nursing notes.   HISTORY  Chief Complaint Hip Pain  HPI Julia Shea is a 63 y.o. female with a past medical history of asthma, bilateral hip pain, depression, hypertension, history of MRSA infection, presents to the emergency department for nausea and vomiting, weakness and left hip pain.  According to the patient for the past 1 week she has been experiencing left hip pain and there is a questionable report of a possible fall 1 week ago.  Patient states she has been nauseated with frequent episodes of vomiting over the past week.  Normally is able to ambulate but has been unable to get out of bed for the past 1 week per patient.  EMS states they found the patient covered in stool and urine.  State very poor living conditions with many dead bugs around her bed.  Patient smells strongly of urine.  Overall patient appears extremely frail, does not appear that she could ambulate.  Does not appear that she is gotten out of bed any time recently.   Past Medical History:  Diagnosis Date  . Asthma   . Bilateral hip pain   . 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 Chronic indwelling Foley catheter   . Closed comminuted left humeral fracture    ORIF in June 2017  . Depression   . Diabetes mellitus type 2, with complication, on long term insulin pump ([place])   . Falls frequently   . Hypertension   . MRSA infection   . Nausea & vomiting   . Rotator cuff tear arthropathy, left     Patient Active Problem List   Diagnosis Date Noted  . Protein-calorie malnutrition, severe 03/31/2018  . Hypokalemia 03/29/2018  . Diabetes mellitus type 2, with complication, on long term insulin pump ([place])   . Nausea & vomiting   . Chronic indwelling Foley catheter   . Bladder problem   . Rotator cuff tear  arthropathy, left   . Malnutrition of moderate degree 01/24/2018  . Pressure injury of skin 12/22/2017  . Hepatic steatosis 05/15/2013  . Abdominal pain, right upper quadrant 05/07/2013  . Breast cancer screening 11/14/2011  . Hypertriglyceridemia 10/30/2011  . Depression 10/26/2011  . Diabetes mellitus type 2, uncontrolled ([place]) 10/26/2011  . Hypertension 10/26/2011    Past Surgical History:  Procedure Laterality Date  . LAPAROSCOPIC OOPHORECTOMY     removal of cyst    Prior to Admission medications   Medication Sig Start Date End Date Taking? Authorizing Provider  acetaminophen (TYLENOL) 500 MG tablet Take 2 tablets (1,000 mg total) by mouth at bedtime as needed. 02/06/18   Tukov-Yual, Arlyss Gandy, NP  amLODipine (NORVASC) 10 MG tablet Take 1 tablet (10 mg total) by mouth daily. 04/01/18   Demetrios Loll, MD  Blood Gluc Meter Disp-Strips (BLOOD GLUCOSE METER DISPOSABLE) DEVI 4 strips by Does not apply route 4 (four) times daily - after meals and at bedtime. 02/06/18   Tukov-Yual, Arlyss Gandy, NP  Calcium Carbonate Antacid (MAALOX) 600 MG chewable tablet Chew 1 tablet (600 mg total) by mouth every 6 (six) hours as needed for heartburn. 02/06/18   Tukov-Yual, Arlyss Gandy, NP  insulin NPH-regular Human (70-30) 100 UNIT/ML injection Inject 10 Units into the skin 2 (two) times daily with a meal. 08/13/18   Mable Fill, Dianah Field,  MD  Insulin Pen Needle (NOVOFINE) 32G X 6 MM MISC 1 Units by Does not apply route as needed. 08/13/18   Tawni Millers, MD  Insulin Syringes, Disposable, U-100 0.3 ML MISC 1 Syringe by Does not apply route 2 (two) times daily. 02/06/18   Tukov-Yual, Arlyss Gandy, NP  levofloxacin (LEVAQUIN) 500 MG tablet Take 1 tablet (500 mg total) by mouth daily. 04/01/18   Demetrios Loll, MD  lisinopril (PRINIVIL,ZESTRIL) 2.5 MG tablet Take 4 tablets (10 mg total) by mouth daily. 02/06/18   Tukov-Yual, Arlyss Gandy, NP  Multiple Vitamins-Minerals (MULTIVITAMIN WITH MINERALS) tablet Take 1 tablet by mouth  daily. 02/06/18   Tukov-Yual, Arlyss Gandy, NP  nitrofurantoin, macrocrystal-monohydrate, (MACROBID) 100 MG capsule Take 1 capsule (100 mg total) by mouth at bedtime. 08/13/18   Tawni Millers, MD  pantoprazole (PROTONIX) 40 MG tablet Take 1 tablet (40 mg total) by mouth daily. 08/13/18   Tawni Millers, MD  potassium chloride (K-DUR) 10 MEQ tablet Take 1 tablet (10 mEq total) by mouth daily. 08/13/18   Tawni Millers, MD  predniSONE (DELTASONE) 20 MG tablet Take 20 mg by mouth daily with breakfast.    [provider]  traMADol (ULTRAM) 50 MG tablet Take 1 tablet (50 mg total) by mouth every 6 (six) hours as needed for moderate pain. 01/28/18   Epifanio Lesches, MD  traZODone (DESYREL) 100 MG tablet Take 1 tablet (100 mg total) by mouth at bedtime. 11/11/18   Tawni Millers, MD    Allergies  Allergen Reactions  . Penicillins     Has patient had a PCN reaction causing immediate rash, facial/tongue/throat swelling, SOB or lightheadedness with hypotension: Yes Has patient had a PCN reaction causing severe rash involving mucus membranes or skin necrosis: Yes Has patient had a PCN reaction that required hospitalization: Yes Has patient had a PCN reaction occurring within the last 10 years: Yes If all of the above answers are "NO", then may proceed with Cephalosporin use.  . Sulfa Antibiotics     Family History  Problem Relation Age of Onset  . Diabetes Maternal Uncle     Social History Social History   Tobacco Use  . Smoking status: Never Smoker  . Smokeless tobacco: Never Used  Substance Use Topics  . Alcohol use: No  . Drug use: No    Review of Systems Constitutional: Negative for fever. Cardiovascular: Negative for chest pain. Respiratory: Negative for shortness of breath.  Slight cough. Gastrointestinal: Negative for abdominal pain.  Positive for nausea vomiting. Genitourinary: Negative for urinary compaints Musculoskeletal: Left hip pain. Neurological: Negative for  headache All other ROS negative  ____________________________________________   PHYSICAL EXAM:  Constitutional: The patient is awake and alert, patient is quite frail appearing.  Does not appear that she could ambulate on her own.  Smells very strong of urine. Eyes: Normal exam ENT      Head: Normocephalic and atraumatic.      Mouth/Throat: Mucous membranes are moist. Cardiovascular: Normal rate, regular rhythm.  Respiratory: Normal respiratory effort without tachypnea nor retractions. Breath sounds are clear Gastrointestinal: Soft and nontender. No distention.   Musculoskeletal: Patient has pedal edema bilaterally, sores to both of her feet and lower extremities with flaking skin.  Small sacral ulcerations to the patient's buttocks/back.  Patient complains of pain in the left hip somewhat worse with movement mild tenderness to palpation. Neurologic:  Normal speech and language.  Appears overall weak but no focal weakness identified on gross examination. Skin:  Skin is warm.  Flaking with lesions to bilateral lower extremities, small sacral/decubitus ulcerations. Psychiatric: Mood and affect are normal.  ____________________________________________    EKG  EKG viewed and interpreted by myself shows a normal sinus rhythm 85 bpm with a narrow QRS, normal axis, normal intervals, no concerning ST changes.  ____________________________________________    RADIOLOGY  Hip x-rays negative. Chest x-ray shows cavitary lesions in left lung base possible cavitary malignancy.  Or atypical infection.  ____________________________________________   INITIAL IMPRESSION / ASSESSMENT AND PLAN / ED COURSE  Pertinent labs & imaging results that were available during my care of the patient were reviewed by me and considered in my medical decision making (see chart for details).   Patient presents to the emergency department for generalized weakness nausea vomiting over the past 1 week.  Patient  appears frail.  Does not appear that she has moved from bed recently.  Smells very strongly of urine.  Appears quite disheveled.  We will check labs, EKG, urinalysis, dose IV fluids, obtain x-rays of the chest and left hip.  Patient agreeable to plan of care.  Chest x-ray shows cavitary lesions possible pneumonia possible malignancy possible atypical infection.  We will perform a rapid Covid as well as send a PCR.  Urinalysis consistent with urinary tract infection we will start on IV Levaquin given penicillin allergy.  Urine culture has been sent.  Patient is also quite hyponatremic with renal insufficiency.  We will continue IV hydration.  Patient will require admission to the hospital for further work-up.  Neveaha Abramyan was evaluated in Emergency Department on 08/21/2019 for the symptoms described in the history of present illness. She was evaluated in the context of the global COVID-19 pandemic, which necessitated consideration that the patient might be at risk for infection with the SARS-CoV-2 virus that causes COVID-19. Institutional protocols and algorithms that pertain to the evaluation of patients at risk for COVID-19 are in a state of rapid change based on information released by regulatory bodies including the CDC and federal and state organizations. These policies and algorithms were followed during the patient's care in the ED.  CRITICAL CARE Performed by: Harvest Dark   Total critical care time: 30 minutes  Critical care time was exclusive of separately billable procedures and treating other patients.  Critical care was necessary to treat or prevent imminent or life-threatening deterioration.  Critical care was time spent personally by me on the following activities: development of treatment plan with patient and/or surrogate as well as nursing, discussions with consultants, evaluation of patient's response to treatment, examination of patient, obtaining history from patient or  surrogate, ordering and performing treatments and interventions, ordering and review of laboratory studies, ordering and review of radiographic studies, pulse oximetry and re-evaluation of patient's condition. ____________________________________________   FINAL CLINICAL IMPRESSION(S) / ED DIAGNOSES  Nausea vomiting Hip pain Weakness Urinary tract infection   Harvest Dark, MD 08/21/19 1442    Harvest Dark, MD 08/28/19 1553

## 2019-08-21 NOTE — H&P (Addendum)
History and Physical:    Julia Shea   [id number] DOB: [birth year] DOA: 08/21/2019  Referring MD/provider: Dr. Harvest Dark PCP: Patient, No Pcp Per   Patient coming from: Home  Chief Complaint: Hip pain   History of Present Illness:   Julia Shea is an 63 y.o. female with history of asthma, bilateral hip pain, depression, hypertension, history of MRSA infection, insulin-dependent diabetes mellitus, CKD stage III.  She presented to the hospital because of nausea, vomiting, generalized weakness and left hip pain.  She has had pain in her left hip for about a week.  She does not remember whether she had a fall or not.  Pain was quite severe.  It was nonradiating and there are no known relieving or aggravating factors.  She says that she had not been able to walk for about a week now and she was laying in bed all the time.  Reportedly, EMS went to her house, she was found laying in feces and urine and she was covered/surrounded by what appeared to be bedbugs.  She said she has been vomiting intermittently for about a week now she feels very weak and tired.  She has an occasional cough productive of clear sputum.  She said she has lost some weight but she does not know how much she has lost.  She has poor oral intake but she has been trying to drink some soup.  No fever, chills, bloody sputum, chest pain, shortness of breath, wheezing, headache, dizziness, or urinary symptoms.  ED Course: Work-up in the ED showed showed sodium of 122, BUN of 64, creatinine of 1.85, glucose of 448, WBC of 20,000 and abnormal urinalysis suggestive of UTI.  She was given IV Levaquin and IV fluids in the emergency room.  ROS:   ROS all other systems reviewed were negative  Past Medical History:   Past Medical History:  Diagnosis Date  . Asthma   . Bilateral hip pain   . 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 Chronic indwelling  Foley catheter   . Closed comminuted left humeral fracture    ORIF in June 2017  . Depression   . Diabetes mellitus type 2, with complication, on long term insulin pump ([place])   . Falls frequently   . Hypertension   . MRSA infection   . Nausea & vomiting   . Rotator cuff tear arthropathy, left     Past Surgical History:   Past Surgical History:  Procedure Laterality Date  . LAPAROSCOPIC OOPHORECTOMY     removal of cyst    Social History:   Social History   Socioeconomic History  . Marital status: Single    Spouse name: Not on file  . Number of children: Not on file  . Years of education: Not on file  . Highest education level: Not on file  Occupational History  . Occupation: retired  Scientific laboratory technician  . Financial resource strain: Not on file  . Food insecurity    Worry: Not on file    Inability: Not on file  . Transportation needs    Medical: Not on file    Non-medical: Not on file  Tobacco Use  . Smoking status: Never Smoker  . Smokeless tobacco: Never Used  Substance and Sexual Activity  . Alcohol use: No  . Drug use: No  . Sexual activity: Not on file  Lifestyle  . Physical activity  Days per week: Not on file    Minutes per session: Not on file  . Stress: Not on file  Relationships  . Social Herbalist on phone: Not on file    Gets together: Not on file    Attends religious service: Not on file    Active member of club or organization: Not on file    Attends meetings of clubs or organizations: Not on file    Relationship status: Not on file  . Intimate partner violence    Fear of current or ex partner: Not on file    Emotionally abused: Not on file    Physically abused: Not on file    Forced sexual activity: Not on file  Other Topics Concern  . Not on file  Social History Narrative  . Not on file    Allergies   Penicillins and Sulfa antibiotics  Family history:   Family History  Problem Relation Age of Onset  . Diabetes  Maternal Uncle     Current Medications:   Prior to Admission medications   Medication Sig Start Date End Date Taking? Authorizing Provider  insulin NPH-regular Human (70-30) 100 UNIT/ML injection Inject 10 Units into the skin 2 (two) times daily with a meal. 08/13/18  Yes Chaplin, Dianah Field, MD  Multiple Vitamins-Minerals (MULTIVITAMIN WITH MINERALS) tablet Take 1 tablet by mouth daily. 02/06/18  Yes Tukov-Yual, Magdalene S, NP  pantoprazole (PROTONIX) 40 MG tablet Take 1 tablet (40 mg total) by mouth daily. 08/13/18  Yes Tawni Millers, MD  potassium chloride (K-DUR) 10 MEQ tablet Take 1 tablet (10 mEq total) by mouth daily. 08/13/18  Yes Tawni Millers, MD  traZODone (DESYREL) 100 MG tablet Take 1 tablet (100 mg total) by mouth at bedtime. 11/11/18  Yes Tawni Millers, MD    Physical Exam:   Vitals:   08/21/19 1345 08/21/19 1400 08/21/19 1500 08/21/19 1630  BP: 115/60 124/74 118/78 114/69  Pulse: 84 87 86 78  Resp: (!) 25  16 (!) 21  Temp:      TempSrc:      SpO2: 100% 100% 100% 100%  Weight:      Height:         Physical Exam: Blood pressure 114/69, pulse 78, temperature 98.6 F (37 C), temperature source Oral, resp. rate (!) 21, height 5\' 2"  (1.575 m), weight 41.7 kg, SpO2 100 %. Gen: No acute distress, cachectic. Head: Normocephalic, atraumatic. Eyes: Pupils equal, round and reactive to light. Extraocular movements intact.  Sclerae nonicteric.  No pallor Mouth: Oropharynx without exudates or erythema.  Poor dentition. Neck: Supple, no thyromegaly, no lymphadenopathy, no jugular venous distention. Chest: Lungs are clear to auscultation with good air movement. No rales, rhonchi or wheezes.  CV: Heart sounds are regular with an S1, S2. No murmurs, rubs or gallops.  Abdomen: Soft, nontender, nondistended with normal active bowel sounds. No hepatosplenomegaly or palpable masses. Extremities: Extremities are without clubbing, or cyanosis.  Bilateral leg and pedal edema. Pedal pulses  2+.  Skin: Poor skin turgor.  Warm and dry.  Multiple erythematous maculopapular rashes on extremities, buttocks and torso she attributes to bug bites.  Small stage II decubitus ulcers on bilateral buttocks Neuro: Alert and oriented times 3; grossly nonfocal.  Psych: Insight is good and judgment is appropriate. Mood and affect normal.   Data Review:    Labs: Basic Metabolic Panel: Recent Labs  Lab 08/21/19 1327  NA 122*  K 4.9  CL  86*  CO2 21*  GLUCOSE 448*  BUN 64*  CREATININE 1.85*  CALCIUM 9.1   Liver Function Tests: Recent Labs  Lab 08/21/19 1327  AST 10*  ALT 13  ALKPHOS 171*  BILITOT 0.5  PROT 7.6  ALBUMIN 2.7*   Recent Labs  Lab 08/21/19 1327  LIPASE 20   No results for input(s): AMMONIA in the last 168 hours. CBC: Recent Labs  Lab 08/21/19 1327  WBC 20.0*  HGB 9.7*  HCT 27.6*  MCV 72.6*  PLT 436*   Cardiac Enzymes: Recent Labs  Lab 08/21/19 1542  CKTOTAL 138    BNP (last 3 results) No results for input(s): PROBNP in the last 8760 hours. CBG: No results for input(s): GLUCAP in the last 168 hours.  Urinalysis    Component Value Date/Time   COLORURINE YELLOW (A) 08/21/2019 1327   APPEARANCEUR TURBID (A) 08/21/2019 1327   LABSPEC 1.015 08/21/2019 1327   PHURINE 5.0 08/21/2019 1327   GLUCOSEU >=500 (A) 08/21/2019 1327   HGBUR LARGE (A) 08/21/2019 1327   BILIRUBINUR NEGATIVE 08/21/2019 1327   BILIRUBINUR neg 05/15/2013 1038   KETONESUR NEGATIVE 08/21/2019 1327   PROTEINUR 30 (A) 08/21/2019 1327   UROBILINOGEN 0.2 05/15/2013 1038   NITRITE NEGATIVE 08/21/2019 1327   LEUKOCYTESUR LARGE (A) 08/21/2019 1327      Radiographic Studies: Dg Chest Portable 1 View  Result Date: 08/21/2019 CLINICAL DATA:  Cough. EXAM: PORTABLE CHEST 1 VIEW COMPARISON:  January 23, 2018 FINDINGS: The patient has multiple cavitary lesions in the left lung as well as multiple other poorly defined patchy lesions in the right lung and in the left lung base. The  heart size and pulmonary vascularity are normal. No discrete effusions. Aortic atherosclerosis. No significant bone abnormality. There also is suggestion of a mass in the left breast. IMPRESSION: 1. Multiple cavitary lesions in the left lung and in the left lung base. The differential diagnosis includes cavitary malignancy, septic emboli, and atypical infection. 2. Possible mass in the left breast. 3. Aortic atherosclerosis. Electronically Signed   By: Lorriane Shire M.D.   On: 08/21/2019 14:20   Dg Hip Unilat With Pelvis 2-3 Views Left  Result Date: 08/21/2019 CLINICAL DATA:  Bilateral hip pain since a fall 1 week ago. Unable to bear weight. EXAM: DG HIP (WITH OR WITHOUT PELVIS) 2-3V LEFT COMPARISON:  Radiographs dated 11/13/2017 FINDINGS: There is no evidence of hip fracture or dislocation. There is no evidence of arthropathy or other focal bone abnormality. IMPRESSION: Negative. Electronically Signed   By: Lorriane Shire M.D.   On: 08/21/2019 14:17    EKG: Independently reviewed.  Normal sinus rhythm   Assessment/Plan:   Principal Problem:   Hyponatremia Active Problems:   Diabetes mellitus type 2, uncontrolled (HCC)   Acute lower UTI   AKI (acute kidney injury) ([place])   Body mass index is 16.83 kg/m.   Acute UTI and leukocytosis: Admit to MedSurg unit.  Treat with empiric IV Levaquin.  Follow-up urine and blood cultures.  Acute hyponatremia likely due to dehydration: Treat with IV normal saline and monitor sodium levels  AKI on CKD stage III: AKI is likely prerenal from dehydration.  Hydrate with IV normal saline and monitor BMP  IDDM with severe hyperglycemia: Treat with low-dose Lantus and NovoLog as needed for hyperglycemia  Multiple cavitary lesions in the left lung: Cover with empiric IV Levaquin.  CT chest without IV contrast (because of AKI) has been ordered for further evaluation  Possible mass in the  left breast: ultrasound of the breast has been ordered further  evaluation.  Low BMI/severe protein, malnutrition: Consult dietitian for further evaluation.  Stage II decubitus ulcers on bilateral buttocks: Present on admission.  Local wound care.  Left hip pain: No evidence of acute fracture or acute abnormality  HIV screening The patient falls between the ages of 13-64 and should be screened for HIV, therefore HIV testing ordered.    Other information:   DVT prophylaxis: Heparin Code Status: Full code. Family Communication: Plan discussed with the patient Disposition Plan: Possible discharge to skilled nursing facility for rehabilitation in 3 to 4 days Consults called: None Admission status: Inpatient  The medical decision making on this patient was of high complexity and the patient is at high risk for clinical deterioration, therefore this is a level 3 visit.    Time spent 72 minutes  Jesus Nevills [facility] Pager [phone]   How to contact the [facility] Attending or Consulting provider [place] or covering provider during after hours [place], for this patient?   1. Check the care team in [facility] and look for a) attending/consulting [facility] provider listed and b) the [facility] team listed 2. Log into [email or web address] and use [place]'s universal password to access. If you do not have the password, please contact the hospital operator. 3. Locate the [facility] provider you are looking for under [facility] and page to a number that you can be directly reached. 4. If you still have difficulty reaching the provider, please page the [facility] (Director on Call) for the Hospitalists listed on amion for assistance.  08/21/2019, 5:28 PM

## 2019-08-21 NOTE — ED Notes (Signed)
Meal tray given to this pt.

## 2019-08-21 NOTE — ED Notes (Signed)
ED TO INPATIENT HANDOFF REPORT  ED Nurse Name and Phone #: noah [phone]   S Name/Age/Gender Julia Shea 62 y.o. female Room/Bed: ED25A/ED25A  Code Status   Code Status: Full Code  Home/SNF/Other Home Patient oriented to: self, place, time and situation Is this baseline? Yes   Triage Complete: Triage complete  Chief Complaint back pain  Triage Note Pt arrival via [facility] from home due to being called for hip pain. EMS states that the patient was at home when she sent her friend who she was living with the neighbors house to call 911 for her hip. EMS found her at the house on the couch covered in her own feces, bugs, and saw what they think was bedbugs.  Pt states that she's had hip pain for about a week and that she hasn't walked much since then. Pt states she fell but is unable to clarify when and how.   EMS states patient is non-compliant with any medications and that she is a diabetic.   EMS VS- BP: 80/50 Bs- 497   Allergies Allergies  Allergen Reactions  . Penicillins     Has patient had a PCN reaction causing immediate rash, facial/tongue/throat swelling, SOB or lightheadedness with hypotension: Yes Has patient had a PCN reaction causing severe rash involving mucus membranes or skin necrosis: Yes Has patient had a PCN reaction that required hospitalization: Yes Has patient had a PCN reaction occurring within the last 10 years: Yes If all of the above answers are "NO", then may proceed with Cephalosporin use.  . Sulfa Antibiotics     Level of Care/Admitting Diagnosis ED Disposition    ED Disposition Condition [facility] Area: [place] [[id number]]  Level of Care: Med-Surg [16]  Covid Evaluation: Asymptomatic Screening Protocol (No Symptoms)  Diagnosis: Hyponatremia [id number]  Admitting Physician: Rayburn Go  Attending Physician: Rayburn Go  Estimated length of stay: 3 - 4 days  Certification:: I  certify this patient will need inpatient services for at least 2 midnights  PT Class (Do Not Modify): Inpatient [101]  PT Acc Code (Do Not Modify): Private [1]       B Medical/Surgery History Past Medical History:  Diagnosis Date  . Asthma   . Bilateral hip pain   . 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 Chronic indwelling Foley catheter   . Closed comminuted left humeral fracture    ORIF in June 2017  . Depression   . Diabetes mellitus type 2, with complication, on long term insulin pump (Julia Shea)   . Falls frequently   . Hypertension   . MRSA infection   . Nausea & vomiting   . Rotator cuff tear arthropathy, left    Past Surgical History:  Procedure Laterality Date  . LAPAROSCOPIC OOPHORECTOMY     removal of cyst     A IV Location/Drains/Wounds Patient Lines/Drains/Airways Status   Active Line/Drains/Airways    Name:   Placement date:   Placement time:   Site:   Days:   Peripheral IV 08/21/19 Left Antecubital   08/21/19    0140    Antecubital   less than 1   Peripheral IV 08/21/19 Right Forearm   08/21/19    1642    Forearm   less than 1   External Urinary Catheter   03/29/18    2040    -   510  Pressure Injury 12/21/17 Stage I -  Intact skin with non-blanchable redness of a localized area usually over a bony prominence. reddened area; no skin breakdown   12/21/17    1809     608          Intake/Output Last 24 hours  Intake/Output Summary (Last 24 hours) at 08/21/2019 2246 Last data filed at 08/21/2019 2134 Gross per 24 hour  Intake 3000 ml  Output -  Net 3000 ml    Labs/Imaging Results for orders placed or performed during the hospital encounter of 08/21/19 (from the past 48 hour(s))  CBC     Status: Abnormal   Collection Time: 08/21/19  1:27 PM  Result Value Ref Range   WBC 20.0 (H) 4.0 - 10.5 K/uL   RBC 3.80 (L) 3.87 - 5.11 MIL/uL   Hemoglobin 9.7 (L) 12.0 - 15.0 g/dL   HCT 27.6 (L)  36.0 - 46.0 %   MCV 72.6 (L) 80.0 - 100.0 fL   MCH 25.5 (L) 26.0 - 34.0 pg   MCHC 35.1 30.0 - 36.0 g/dL   RDW 13.2 11.5 - 15.5 %   Platelets 436 (H) 150 - 400 K/uL   nRBC 0.0 0.0 - 0.2 %    Comment: Performed at [facility], [address] [place], [place] [address]  CMP     Status: Abnormal   Collection Time: 08/21/19  1:27 PM  Result Value Ref Range   Sodium 122 (L) 135 - 145 mmol/L   Potassium 4.9 3.5 - 5.1 mmol/L   Chloride 86 (L) 98 - 111 mmol/L   CO2 21 (L) 22 - 32 mmol/L   Glucose, Bld 448 (H) 70 - 99 mg/dL   BUN 64 (H) 8 - 23 mg/dL   Creatinine, Ser 1.85 (H) 0.44 - 1.00 mg/dL   Calcium 9.1 8.9 - 10.3 mg/dL   Total Protein 7.6 6.5 - 8.1 g/dL   Albumin 2.7 (L) 3.5 - 5.0 g/dL   AST 10 (L) 15 - 41 U/L   ALT 13 0 - 44 U/L   Alkaline Phosphatase 171 (H) 38 - 126 U/L   Total Bilirubin 0.5 0.3 - 1.2 mg/dL   GFR calc non Af Amer 28 (L) >60 mL/min   GFR calc Af Amer 33 (L) >60 mL/min   Anion gap 15 5 - 15    Comment: Performed at [facility], [address] [place], [place] [address]  Lipase, blood     Status: None   Collection Time: 08/21/19  1:27 PM  Result Value Ref Range   Lipase 20 11 - 51 U/L    Comment: Performed at [facility], [address] [place], [place] [address]  Troponin I (High Sensitivity)     Status: None   Collection Time: 08/21/19  1:27 PM  Result Value Ref Range   Troponin I (High Sensitivity) 7 <18 ng/L    Comment: (NOTE) Elevated high sensitivity troponin I (hsTnI) values and significant  changes across serial measurements may suggest ACS but many other  chronic and acute conditions are known to elevate hsTnI results.  Refer to the "Links" section for chest pain algorithms and additional  guidance. Performed at [facility], [address] [place],  [address]   Urinalysis, Complete w Microscopic     Status: Abnormal   Collection Time: 08/21/19  1:27 PM  Result Value Ref Range   Color, Urine  YELLOW (A) YELLOW   APPearance TURBID (A) CLEAR  Specific Gravity, Urine 1.015 1.005 - 1.030   pH 5.0 5.0 - 8.0   Glucose, UA >=500 (A) NEGATIVE mg/dL   Hgb urine dipstick LARGE (A) NEGATIVE   Bilirubin Urine NEGATIVE NEGATIVE   Ketones, ur NEGATIVE NEGATIVE mg/dL   Protein, ur 30 (A) NEGATIVE mg/dL   Nitrite NEGATIVE NEGATIVE   Leukocytes,Ua LARGE (A) NEGATIVE   RBC / HPF >50 (H) 0 - 5 RBC/hpf   WBC, UA >50 (H) 0 - 5 WBC/hpf   Bacteria, UA MANY (A) NONE SEEN   Squamous Epithelial / LPF NONE SEEN 0 - 5   Mucus PRESENT    Budding Yeast PRESENT     Comment: Performed at [facility], [address] [place], [place] [address]  POC SARS Coronavirus 2 Ag     Status: None   Collection Time: 08/21/19  3:24 PM  Result Value Ref Range   SARS Coronavirus 2 Ag NEGATIVE NEGATIVE    Comment: (NOTE) SARS-CoV-2 antigen NOT DETECTED.  Negative results are presumptive.  Negative results do not preclude SARS-CoV-2 infection and should not be used as the sole basis for treatment or other patient management decisions, including infection  control decisions, particularly in the presence of clinical signs and  symptoms consistent with COVID-19, or in those who have been in contact with the virus.  Negative results must be combined with clinical observations, patient history, and epidemiological information. The expected result is Negative. Fact Sheet for Patients: PodPark.tn Fact Sheet for Healthcare Providers: GiftContent.is This test is not yet approved or cleared by the [place] FDA and  has been authorized for detection and/or diagnosis of SARS-CoV-2 by FDA under an Emergency Use Authorization (EUA).  This EUA will remain in effect (meaning this test can be used) for the duration of  the COVID-19 de claration under Section 564(b)(1) of the Act, 21 U.S.C. section 360bbb-3(b)(1), unless the authorization is terminated or  revoked sooner.   vbg     Status: Abnormal (Preliminary result)   Collection Time: 08/21/19  3:42 PM  Result Value Ref Range   pH, Ven 7.33 7.250 - 7.430   pCO2, Ven 38 (L) 44.0 - 60.0 mmHg   pO2, Ven PENDING 32.0 - 45.0 mmHg   Bicarbonate 20.0 20.0 - 28.0 mmol/L   Acid-base deficit 5.4 (H) 0.0 - 2.0 mmol/L   O2 Saturation 9.6 %   Patient temperature 37.0    Collection site VEIN    Sample type VEIN     Comment: Performed at [facility], [address] [place], [place] [address]  Troponin I (High Sensitivity)     Status: None   Collection Time: 08/21/19  3:42 PM  Result Value Ref Range   Troponin I (High Sensitivity) 8 <18 ng/L    Comment: (NOTE) Elevated high sensitivity troponin I (hsTnI) values and significant  changes across serial measurements may suggest ACS but many other  chronic and acute conditions are known to elevate hsTnI results.  Refer to the "Links" section for chest pain algorithms and additional  guidance. Performed at [facility], [address] [place], [place] [address]   CK     Status: None   Collection Time: 08/21/19  3:42 PM  Result Value Ref Range   Total CK 138 38 - 234 U/L    Comment: Performed at [facility], [address] [place], [place] [address]  Glucose, capillary     Status: Abnormal   Collection Time: 08/21/19  5:27 PM  Result Value Ref Range   Glucose-Capillary 353 (H) 70 - 99 mg/dL   Comment 1 Notify RN   Sodium, urine, random     Status: None   Collection Time: 08/21/19  5:28 PM  Result Value Ref Range   Sodium, Ur 28 mmol/L    Comment: Performed at [facility], [address] [place], [place] [address]  Osmolality, urine     Status: None   Collection Time: 08/21/19  5:28 PM  Result Value Ref Range   Osmolality, Ur 454 300 - 900 mOsm/kg    Comment: Performed at [facility], [address] [place], [place] [address]  Creatinine, urine, random     Status: None   Collection  Time: 08/21/19  5:28 PM  Result Value Ref Range   Creatinine, Urine 43 mg/dL    Comment: Performed at [facility], [address] [place], [place] [address]  Osmolality     Status: None   Collection Time: 08/21/19  6:37 PM  Result Value Ref Range   Osmolality 294 275 - 295 mOsm/kg    Comment: Performed at [facility], [address] [place],  [address]  Glucose, capillary     Status: Abnormal   Collection Time: 08/21/19  7:55 PM  Result Value Ref Range   Glucose-Capillary 284 (H) 70 - 99 mg/dL  Glucose, capillary     Status: Abnormal   Collection Time: 08/21/19 10:28 PM  Result Value Ref Range   Glucose-Capillary 115 (H) 70 - 99 mg/dL   Ct Chest Wo Contrast  Result Date: 08/21/2019 CLINICAL DATA:  Cough.  Persistent left lung cavitary lesion. EXAM: CT CHEST WITHOUT CONTRAST TECHNIQUE: Multidetector CT imaging of the chest was performed following the standard protocol without IV contrast. COMPARISON:  Prior chest radiographs. FINDINGS: Cardiovascular: Heart is normal in size and configuration. No pericardial effusion. Two vessel coronary artery calcifications. Aortic atherosclerotic calcifications. Mediastinum/Nodes: No enlarged mediastinal or axillary lymph nodes. Thyroid gland, trachea, and esophagus demonstrate no significant findings. Lungs/Pleura: Multiple cavitary lesion/masses and non cavitary nodules. In the left upper lobe, there is a cavitary lesion that measures 5.9 x 3.5 cm transversely, centered on image 49, series 2. Cavities contain dependent fluid. There is hazy airspace opacity along the periphery of the lesion mostly at superior aspect. In the right upper lobe there is a cavitary mass/lesion measuring 3.6 x 3.5 cm, centered on image 32, series 2. The central cavity contains dependent fluid. There is hazy airspace opacity surrounding the periphery of this lesion. In the posterior right lower lobe there is a cavitary mass/lesion measuring 5.2 x 2.4  cm containing bubbles of air, centered on image 79, series 2. A second cavitary lesion is seen superior to this measuring 2.6 x 1.9 cm. Hazy ground-glass opacity also is seen along the periphery of these lesions. In the left lower lobe abutting the inferior oblique fissure, image 105, series 2, there is a nodule measuring 2.9 x 2.1 cm containing a single small bubble of nondependent air. There are several other lung nodules mostly in the lower lobes. Remainder of the lungs is clear. No evidence of pulmonary edema. No pleural effusion or pneumothorax. Upper Abdomen: No acute abnormality. Musculoskeletal: No fracture or acute finding. No osteoblastic or osteolytic lesions. IMPRESSION: 1. Multiple cavitary masses/lesions and lung nodules as detailed above. Suspect the etiology is infectious/inflammatory. Larger cavitary lesions contain dependent fluid supporting infection. Consider atypical infections including fungal and mycobacterial. Recommend follow-up chest CT after treatment to  assess response. 2. No other acute abnormality within the chest. 3. Coronary artery calcifications and aortic atherosclerosis. Aortic Atherosclerosis (ICD10-I70.0). Electronically Signed   By: Lajean Manes M.D.   On: 08/21/2019 18:14   Dg Chest Portable 1 View  Result Date: 08/21/2019 CLINICAL DATA:  Cough. EXAM: PORTABLE CHEST 1 VIEW COMPARISON:  Tamora Huneke 25, 2019 FINDINGS: The patient has multiple cavitary lesions in the left lung as well as multiple other poorly defined patchy lesions in the right lung and in the left lung base. The heart size and pulmonary vascularity are normal. No discrete effusions. Aortic atherosclerosis. No significant bone abnormality. There also is suggestion of a mass in the left breast. IMPRESSION: 1. Multiple cavitary lesions in the left lung and in the left lung base. The differential diagnosis includes cavitary malignancy, septic emboli, and atypical infection. 2. Possible mass in the left breast. 3.  Aortic atherosclerosis. Electronically Signed   By: Lorriane Shire M.D.   On: 08/21/2019 14:20   Dg Hip Unilat With Pelvis 2-3 Views Left  Result Date: 08/21/2019 CLINICAL DATA:  Bilateral hip pain since a fall 1 week ago. Unable to bear weight. EXAM: DG HIP (WITH OR WITHOUT PELVIS) 2-3V LEFT COMPARISON:  Radiographs dated 11/13/2017 FINDINGS: There is no evidence of hip fracture or dislocation. There is no evidence of arthropathy or other focal bone abnormality. IMPRESSION: Negative. Electronically Signed   By: Lorriane Shire M.D.   On: 08/21/2019 14:17    Pending Labs Unresulted Labs (From admission, onward)    Start     Ordered   08/22/19 [id number]  Basic metabolic panel  Tomorrow morning,   STAT     08/21/19 1939   08/22/19 0500  CBC  Tomorrow morning,   STAT     08/21/19 1939   08/21/19 1940  HIV Antibody (routine testing w rflx)  (HIV Antibody (Routine testing w reflex) panel)  Once,   STAT     08/21/19 1939   08/21/19 1657  Hemoglobin A1c  Add-on,   AD    Comments: To assess prior glycemic control    08/21/19 1658   08/21/19 1511  Blood culture (routine x 2)  BLOOD CULTURE X 2,   STAT     08/21/19 1510   08/21/19 1412  SARS CORONAVIRUS 2 (TAT 6-24 HRS) Nasopharyngeal Nasopharyngeal Swab  (Asymptomatic/Tier 3)  Once,   STAT    Question Answer Comment  Is this test for diagnosis or screening Screening   Symptomatic for COVID-19 as defined by CDC No   Hospitalized for COVID-19 No   Admitted to ICU for COVID-19 No   Previously tested for COVID-19 No   Resident in a congregate (group) care setting No   Employed in healthcare setting No   Pregnant No      08/21/19 1412   08/21/19 1412  Urine culture  Add-on,   AD     08/21/19 1412          Vitals/Pain Today's Vitals   08/21/19 2200 08/21/19 2230 08/21/19 2235 08/21/19 2241  BP: (!) 87/61 (!) 82/51    Pulse: 81 83    Resp: 20 17    Temp:   97.7 F (36.5 C)   TempSrc:   Oral   SpO2: 100% 100%    Weight:      Height:       PainSc:    0-No pain    Isolation Precautions No active isolations  Medications Medications  insulin glargine (LANTUS) injection 10  Units (10 Units Subcutaneous Given 08/21/19 2231)  insulin aspart (novoLOG) injection 0-9 Units (9 Units Subcutaneous Given 08/21/19 1855)  pantoprazole (PROTONIX) EC tablet 40 mg (has no administration in time range)  heparin injection 5,000 Units (5,000 Units Subcutaneous Given 08/21/19 2228)  acetaminophen (TYLENOL) tablet 650 mg (has no administration in time range)    Or  acetaminophen (TYLENOL) suppository 650 mg (has no administration in time range)  polyethylene glycol (MIRALAX / GLYCOLAX) packet 17 g (has no administration in time range)  Levofloxacin (LEVAQUIN) IVPB 250 mg (has no administration in time range)  0.9 %  sodium chloride infusion ( Intravenous New Bag/Given 08/21/19 1842)  sodium chloride 0.9 % bolus 1,000 mL (0 mLs Intravenous Stopped 08/21/19 1612)  levofloxacin (LEVAQUIN) IVPB 500 mg (0 mg Intravenous Stopped 08/21/19 1612)  sodium chloride 0.9 % bolus 1,000 mL (0 mLs Intravenous Stopped 08/21/19 1734)  sodium chloride 0.9 % bolus 1,000 mL (0 mLs Intravenous Stopped 08/21/19 2134)    Mobility non-ambulatory High fall risk   Focused Assessments Cardiac Assessment Handoff:  Cardiac Rhythm: Normal sinus rhythm Lab Results  Component Value Date   CKTOTAL 138 08/21/2019   TROPONINI <0.03 12/21/2017   No results found for: DDIMER Does the Patient currently have chest pain? No     R Recommendations: See Admitting Provider Note  Report given to:   Additional Notes:

## 2019-08-21 NOTE — ED Notes (Signed)
Delay in transporting pt to floor, lab here again to repeat lab work.

## 2019-08-21 NOTE — ED Provider Notes (Signed)
Patient admitted here with sepsis 2/2 UTI on Levaquin. Dehydrated w/ hypovolemic hyponatremia on admission. Received 2L fluid on arrival and has been on mIVF. Notified by nursing that pt increasingly hypotensive with decreased UOP after fluid boluses. Pt has been admitted but unable to reach hospitalist at this time. 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 Will attempt to notify hospitalist.   Duffy Bruce, MD 08/21/19 2139

## 2019-08-21 NOTE — ED Notes (Addendum)
Warm blankets provided. Pt is currently dry, urine wicking into pure wick suction. Pt declines offer for po fluids.

## 2019-08-22 DIAGNOSIS — J984 Other disorders of lung: Secondary | ICD-10-CM

## 2019-08-22 DIAGNOSIS — J189 Pneumonia, unspecified organism: Secondary | ICD-10-CM

## 2019-08-22 DIAGNOSIS — I76 Septic arterial embolism: Secondary | ICD-10-CM

## 2019-08-22 DIAGNOSIS — A4902 Methicillin resistant Staphylococcus aureus infection, unspecified site: Secondary | ICD-10-CM

## 2019-08-22 LAB — BASIC METABOLIC PANEL
Anion gap: 9 (ref 5–15)
Anion gap: 9 (ref 5–15)
BUN: 50 mg/dL — ABNORMAL HIGH (ref 8–23)
BUN: 43 mg/dL — ABNORMAL HIGH (ref 8–23)
CO2: 18 mmol/L — ABNORMAL LOW (ref 22–32)
CO2: 17 mmol/L — ABNORMAL LOW (ref 22–32)
Calcium: 7.7 mg/dL — ABNORMAL LOW (ref 8.9–10.3)
Calcium: 7.3 mg/dL — ABNORMAL LOW (ref 8.9–10.3)
Chloride: 104 mmol/L (ref 98–111)
Chloride: 103 mmol/L (ref 98–111)
Creatinine, Ser: 1.27 mg/dL — ABNORMAL HIGH (ref 0.44–1.00)
Creatinine, Ser: 1.1 mg/dL — ABNORMAL HIGH (ref 0.44–1.00)
GFR calc Af Amer: 52 mL/min — ABNORMAL LOW (ref >60)
GFR calc Af Amer: >60 mL/min (ref >60)
GFR calc non Af Amer: 45 mL/min — ABNORMAL LOW (ref >60)
GFR calc non Af Amer: 53 mL/min — ABNORMAL LOW (ref >60)
Glucose, Bld: 112 mg/dL — ABNORMAL HIGH (ref 70–99)
Glucose, Bld: 143 mg/dL — ABNORMAL HIGH (ref 70–99)
Potassium: 3.7 mmol/L (ref 3.5–5.1)
Potassium: 3.3 mmol/L — ABNORMAL LOW (ref 3.5–5.1)
Sodium: 131 mmol/L — ABNORMAL LOW (ref 135–145)
Sodium: 129 mmol/L — ABNORMAL LOW (ref 135–145)

## 2019-08-22 LAB — CBC
HCT: 22 % — ABNORMAL LOW (ref 36.0–46.0)
Hemoglobin: 7.4 g/dL — ABNORMAL LOW (ref 12.0–15.0)
MCH: 25.7 pg — ABNORMAL LOW (ref 26.0–34.0)
MCHC: 33.6 g/dL (ref 30.0–36.0)
MCV: 76.4 fL — ABNORMAL LOW (ref 80.0–100.0)
Platelets: 366 10*3/uL (ref 150–400)
RBC: 2.88 MIL/uL — ABNORMAL LOW (ref 3.87–5.11)
RDW: 13.4 % (ref 11.5–15.5)
WBC: 17.4 10*3/uL — ABNORMAL HIGH (ref 4.0–10.5)
nRBC: 0 % (ref 0.0–0.2)

## 2019-08-22 LAB — CBC WITH DIFFERENTIAL/PLATELET
Abs Immature Granulocytes: 0.1 10*3/uL — ABNORMAL HIGH (ref 0.00–0.07)
Basophils Absolute: 0 10*3/uL (ref 0.0–0.1)
Basophils Relative: 0 %
Eosinophils Absolute: 0 10*3/uL (ref 0.0–0.5)
Eosinophils Relative: 0 %
HCT: 21.6 % — ABNORMAL LOW (ref 36.0–46.0)
Hemoglobin: 7.4 g/dL — ABNORMAL LOW (ref 12.0–15.0)
Immature Granulocytes: 1 %
Lymphocytes Relative: 10 %
Lymphs Abs: 1.6 10*3/uL (ref 0.7–4.0)
MCH: 25.3 pg — ABNORMAL LOW (ref 26.0–34.0)
MCHC: 34.3 g/dL (ref 30.0–36.0)
MCV: 73.7 fL — ABNORMAL LOW (ref 80.0–100.0)
Monocytes Absolute: 0.8 10*3/uL (ref 0.1–1.0)
Monocytes Relative: 5 %
Neutro Abs: 14.3 10*3/uL — ABNORMAL HIGH (ref 1.7–7.7)
Neutrophils Relative %: 84 %
Platelets: 335 10*3/uL (ref 150–400)
RBC: 2.93 MIL/uL — ABNORMAL LOW (ref 3.87–5.11)
RDW: 13.4 % (ref 11.5–15.5)
WBC: 17 10*3/uL — ABNORMAL HIGH (ref 4.0–10.5)
nRBC: 0 % (ref 0.0–0.2)

## 2019-08-22 LAB — MAGNESIUM: Magnesium: 1.6 mg/dL — ABNORMAL LOW (ref 1.7–2.4)

## 2019-08-22 LAB — TSH: TSH: 1.122 u[IU]/mL (ref 0.350–4.500)

## 2019-08-22 LAB — GLUCOSE, CAPILLARY
Glucose-Capillary: 87 mg/dL (ref 70–99)
Glucose-Capillary: 141 mg/dL — ABNORMAL HIGH (ref 70–99)
Glucose-Capillary: 128 mg/dL — ABNORMAL HIGH (ref 70–99)
Glucose-Capillary: 168 mg/dL — ABNORMAL HIGH (ref 70–99)
Glucose-Capillary: 168 mg/dL — ABNORMAL HIGH (ref 70–99)

## 2019-08-22 LAB — CORTISOL: Cortisol, Plasma: 26.6 ug/dL

## 2019-08-22 LAB — SEDIMENTATION RATE: Sed Rate: 126 mm/hr — ABNORMAL HIGH (ref 0–30)

## 2019-08-22 LAB — MRSA PCR SCREENING: MRSA by PCR: POSITIVE — AB

## 2019-08-22 LAB — SARS CORONAVIRUS 2 (TAT 6-24 HRS): SARS Coronavirus 2: NEGATIVE

## 2019-08-22 LAB — HIV ANTIBODY (ROUTINE TESTING W REFLEX): HIV Screen 4th Generation wRfx: NONREACTIVE

## 2019-08-22 MED ORDER — VANCOMYCIN HCL IN DEXTROSE 750-5 MG/150ML-% IV SOLN
750.0000 mg | INTRAVENOUS | Status: DC
  Administered 2019-08-23: 18:00:00 750 mg via INTRAVENOUS
  Filled 2019-08-22 (×2): qty 150

## 2019-08-22 MED ORDER — VANCOMYCIN HCL 1.25 G IV SOLR
1250.0000 mg | Freq: Once | INTRAVENOUS | Status: AC
  Administered 2019-08-22: 1250 mg via INTRAVENOUS
  Filled 2019-08-22: qty 1250

## 2019-08-22 MED ORDER — MAGNESIUM SULFATE 2 GM/50ML IV SOLN
2.0000 g | Freq: Once | INTRAVENOUS | Status: AC
  Administered 2019-08-22: 16:00:00 2 g via INTRAVENOUS
  Filled 2019-08-22: qty 50

## 2019-08-22 MED ORDER — SODIUM CHLORIDE 0.9 % IV SOLN
INTRAVENOUS | Status: DC | PRN
  Administered 2019-08-22 – 2019-08-23 (×2): 250 mL via INTRAVENOUS
  Administered 2019-08-29: 20 mL via INTRAVENOUS
  Administered 2019-08-30 – 2019-08-31 (×2): 250 mL via INTRAVENOUS
  Administered 2019-09-01: 20 mL via INTRAVENOUS
  Administered 2019-09-04 – 2019-09-06 (×4): 250 mL via INTRAVENOUS
  Administered 2019-09-08: 23:00:00 20 mL via INTRAVENOUS

## 2019-08-22 MED ORDER — CALCIUM GLUCONATE-NACL 1-0.675 GM/50ML-% IV SOLN
1.0000 g | Freq: Once | INTRAVENOUS | Status: AC
  Administered 2019-08-22: 10:00:00 1000 mg via INTRAVENOUS
  Filled 2019-08-22: qty 50

## 2019-08-22 MED ORDER — POTASSIUM CHLORIDE CRYS ER 20 MEQ PO TBCR
40.0000 meq | EXTENDED_RELEASE_TABLET | Freq: Two times a day (BID) | ORAL | Status: AC
  Administered 2019-08-22 (×2): 40 meq via ORAL
  Filled 2019-08-22 (×2): qty 2

## 2019-08-22 MED ORDER — LACTATED RINGERS IV SOLN
INTRAVENOUS | Status: DC
  Administered 2019-08-22 – 2019-08-25 (×5): via INTRAVENOUS

## 2019-08-22 MED ORDER — SODIUM CHLORIDE 0.9 % IV SOLN: 1.0000 g | Freq: Once | INTRAVENOUS | Status: DC

## 2019-08-22 MED ORDER — GLUCERNA SHAKE PO LIQD
237.0000 mL | Freq: Three times a day (TID) | ORAL | Status: DC
  Administered 2019-08-22 – 2019-08-26 (×10): 237 mL via ORAL

## 2019-08-22 MED ORDER — SODIUM CHLORIDE 0.9 % IV SOLN
1.0000 g | Freq: Two times a day (BID) | INTRAVENOUS | Status: DC
  Administered 2019-08-22 – 2019-08-23 (×4): 1 g via INTRAVENOUS
  Filled 2019-08-22 (×6): qty 1

## 2019-08-22 NOTE — Consult Note (Signed)
Pulmonary Critical Care  Initial Consult Note  Julia Shea [id number] DOB: April 08, 1956 DOA: 08/21/2019  Referring physician: Dr. Ilona Sorrel  Chief Complaint: Cavitary pneumonia  HPI: Julia Shea is a 63 y.o. female with multiple medical problems including hypertension MRSA infection in the past insulin-dependent diabetes chronic kidney disease stage III chronic obstructive asthma chronic depression.  Patient came into the hospital because of increasing shortness of breath.  She apparently is in between residences and has been staying at a friend's home.  She denied having been homeless however, she states that they have been looking actively for her for a place to stay.  She came in again as mentioned because of shortness of breath she had some chest pain noted.  She patient states that she actually did have falls with some possibly loss of consciousness.  The pain reportedly was severe.  The patient was seen by EMS and was found to be laying in feces and urine along with bedbugs.  Patient also had been having episodes of vomiting going on for about a week.  When questioned directly about any history of alcohol she denied it.  She states that she has no history of drug abuse.  The patient denied having any headaches.  She has been having chills.  She also has been noting weight loss.  Review of Systems:  Constitutional:  +weight loss, +sweats, Fevers, +chills, +fatigue.  HEENT:  No headaches, nasal congestion, post nasal drip,  Cardio-vascular:  +chest pain, Orthopnea, PND+dizziness, palpitations  GI:  No heartburn, indigestion, abdominal pain, +nausea, +vomiting  Resp:  No shortness of breath with exertion or at rest. no productive cough, No coughing up of blood.No wheezing Skin:  no rash or lesions.  Musculoskeletal:  No joint pain or swelling.   Remainder ROS performed and is unremarkable other than noted in HPI  Past Medical History:  Diagnosis Date  . Asthma   . Bilateral hip  pain   . 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 Chronic indwelling Foley catheter   . Closed comminuted left humeral fracture    ORIF in June 2017  . Depression   . Diabetes mellitus type 2, with complication, on long term insulin pump ([place])   . Falls frequently   . Hypertension   . MRSA infection   . Nausea & vomiting   . Rotator cuff tear arthropathy, left    Past Surgical History:  Procedure Laterality Date  . LAPAROSCOPIC OOPHORECTOMY     removal of cyst   Social History:  reports that she has never smoked. She has never used smokeless tobacco. She reports that she does not drink alcohol or use drugs.  Allergies  Allergen Reactions  . Penicillins     Has patient had a PCN reaction causing immediate rash, facial/tongue/throat swelling, SOB or lightheadedness with hypotension: Yes Has patient had a PCN reaction causing severe rash involving mucus membranes or skin necrosis: Yes Has patient had a PCN reaction that required hospitalization: Yes Has patient had a PCN reaction occurring within the last 10 years: Yes If all of the above answers are "NO", then may proceed with Cephalosporin use.  . Sulfa Antibiotics     Family History  Problem Relation Age of Onset  . Diabetes Maternal Uncle     Prior to Admission medications   Medication Sig Start Date End Date Taking? Authorizing Provider  insulin NPH-regular Human (70-30) 100 UNIT/ML injection Inject 10  Units into the skin 2 (two) times daily with a meal. 08/13/18  Yes Chaplin, Dianah Field, MD  Multiple Vitamins-Minerals (MULTIVITAMIN WITH MINERALS) tablet Take 1 tablet by mouth daily. 02/06/18  Yes Tukov-Yual, Magdalene S, NP  pantoprazole (PROTONIX) 40 MG tablet Take 1 tablet (40 mg total) by mouth daily. 08/13/18  Yes Tawni Millers, MD  potassium chloride (K-DUR) 10 MEQ tablet Take 1 tablet (10 mEq total) by mouth daily. 08/13/18  Yes Tawni Millers, MD   traZODone (DESYREL) 100 MG tablet Take 1 tablet (100 mg total) by mouth at bedtime. 11/11/18  Yes Tawni Millers, MD   Physical Exam: Vitals:   08/21/19 2300 08/22/19 0149 08/22/19 0300 08/22/19 1014  BP: (!) 98/59 100/63  115/69  Pulse: 78 82  93  Resp: 17 17  18   Temp:  (!) 97.5 F (36.4 C)  98 F (36.7 C)  TempSrc:  Oral  Oral  SpO2: 99% 100%  100%  Weight:   52.7 kg   Height:        Wt Readings from Last 3 Encounters:  08/22/19 52.7 kg  08/13/18 52.2 kg  03/29/18 46.9 kg    General:  Appears calm and comfortable Eyes: PERRL, normal lids, irises & conjunctiva ENT: grossly normal hearing, lips & tongue Neck: no LAD, masses or thyromegaly Cardiovascular: RRR, no m/r/g. No LE edema. Respiratory: CTA bilaterally, no w/r/r.       Normal respiratory effort. Abdomen: soft, nontender Skin: no rash or induration seen on limited exam Musculoskeletal: grossly normal tone BUE/BLE Psychiatric: grossly normal mood and affect Neurologic: grossly non-focal.          Labs on Admission:  Basic Metabolic Panel: Recent Labs  Lab 08/21/19 1327 08/21/19 2313 08/22/19 0813  NA 122* 131* 129*  K 4.9 3.7 3.3*  CL 86* 104 103  CO2 21* 18* 17*  GLUCOSE 448* 112* 143*  BUN 64* 50* 43*  CREATININE 1.85* 1.27* 1.10*  CALCIUM 9.1 7.7* 7.3*  MG  --  1.6*  --    Liver Function Tests: Recent Labs  Lab 08/21/19 1327  AST 10*  ALT 13  ALKPHOS 171*  BILITOT 0.5  PROT 7.6  ALBUMIN 2.7*   Recent Labs  Lab 08/21/19 1327  LIPASE 20   No results for input(s): AMMONIA in the last 168 hours. CBC: Recent Labs  Lab 08/21/19 1327 08/21/19 2313 08/22/19 0813  WBC 20.0* 17.0* 17.4*  NEUTROABS  --  14.3*  --   HGB 9.7* 7.4* 7.4*  HCT 27.6* 21.6* 22.0*  MCV 72.6* 73.7* 76.4*  PLT 436* 335 366   Cardiac Enzymes: Recent Labs  Lab 08/21/19 1542  CKTOTAL 138    BNP (last 3 results) No results for input(s): BNP in the last 8760 hours.  ProBNP (last 3 results) No results  for input(s): PROBNP in the last 8760 hours.  CBG: Recent Labs  Lab 08/21/19 1727 08/21/19 1955 08/21/19 2228 08/22/19 0755 08/22/19 1200  GLUCAP 353* 284* 115* 141* 128*    Radiological Exams on Admission: Ct Chest Wo Contrast  Result Date: 08/21/2019 CLINICAL DATA:  Cough.  Persistent left lung cavitary lesion. EXAM: CT CHEST WITHOUT CONTRAST TECHNIQUE: Multidetector CT imaging of the chest was performed following the standard protocol without IV contrast. COMPARISON:  Prior chest radiographs. FINDINGS: Cardiovascular: Heart is normal in size and configuration. No pericardial effusion. Two vessel coronary artery calcifications. Aortic atherosclerotic calcifications. Mediastinum/Nodes: No enlarged mediastinal or axillary lymph nodes. Thyroid gland, trachea, and  esophagus demonstrate no significant findings. Lungs/Pleura: Multiple cavitary lesion/masses and non cavitary nodules. In the left upper lobe, there is a cavitary lesion that measures 5.9 x 3.5 cm transversely, centered on image 49, series 2. Cavities contain dependent fluid. There is hazy airspace opacity along the periphery of the lesion mostly at superior aspect. In the right upper lobe there is a cavitary mass/lesion measuring 3.6 x 3.5 cm, centered on image 32, series 2. The central cavity contains dependent fluid. There is hazy airspace opacity surrounding the periphery of this lesion. In the posterior right lower lobe there is a cavitary mass/lesion measuring 5.2 x 2.4 cm containing bubbles of air, centered on image 79, series 2. A second cavitary lesion is seen superior to this measuring 2.6 x 1.9 cm. Hazy ground-glass opacity also is seen along the periphery of these lesions. In the left lower lobe abutting the inferior oblique fissure, image 105, series 2, there is a nodule measuring 2.9 x 2.1 cm containing a single small bubble of nondependent air. There are several other lung nodules mostly in the lower lobes. Remainder of the  lungs is clear. No evidence of pulmonary edema. No pleural effusion or pneumothorax. Upper Abdomen: No acute abnormality. Musculoskeletal: No fracture or acute finding. No osteoblastic or osteolytic lesions. IMPRESSION: 1. Multiple cavitary masses/lesions and lung nodules as detailed above. Suspect the etiology is infectious/inflammatory. Larger cavitary lesions contain dependent fluid supporting infection. Consider atypical infections including fungal and mycobacterial. Recommend follow-up chest CT after treatment to assess response. 2. No other acute abnormality within the chest. 3. Coronary artery calcifications and aortic atherosclerosis. Aortic Atherosclerosis (ICD10-I70.0). Electronically Signed   By: Lajean Manes M.D.   On: 08/21/2019 18:14   Dg Chest Portable 1 View  Result Date: 08/21/2019 CLINICAL DATA:  Cough. EXAM: PORTABLE CHEST 1 VIEW COMPARISON:  January 23, 2018 FINDINGS: The patient has multiple cavitary lesions in the left lung as well as multiple other poorly defined patchy lesions in the right lung and in the left lung base. The heart size and pulmonary vascularity are normal. No discrete effusions. Aortic atherosclerosis. No significant bone abnormality. There also is suggestion of a mass in the left breast. IMPRESSION: 1. Multiple cavitary lesions in the left lung and in the left lung base. The differential diagnosis includes cavitary malignancy, septic emboli, and atypical infection. 2. Possible mass in the left breast. 3. Aortic atherosclerosis. Electronically Signed   By: Lorriane Shire M.D.   On: 08/21/2019 14:20   Dg Hip Unilat With Pelvis 2-3 Views Left  Result Date: 08/21/2019 CLINICAL DATA:  Bilateral hip pain since a fall 1 week ago. Unable to bear weight. EXAM: DG HIP (WITH OR WITHOUT PELVIS) 2-3V LEFT COMPARISON:  Radiographs dated 11/13/2017 FINDINGS: There is no evidence of hip fracture or dislocation. There is no evidence of arthropathy or other focal bone abnormality.  IMPRESSION: Negative. Electronically Signed   By: Lorriane Shire M.D.   On: 08/21/2019 14:17    EKG: Independently reviewed.  Assessment/Plan Principal Problem:   Hyponatremia Active Problems:   Diabetes mellitus type 2, uncontrolled ([place])   Acute lower UTI   AKI (acute kidney injury) ([place])   1. Cavitary pneumonia the lesions are noted to be bilateral and would be more consistent with an infectious process.  Entirely cannot rule out neoplastic process however as noted it favors more of an infectious process.  I would also consider fungal and atypical infections.  Conceivably could have tuberculous infection versus nontuberculous mycobacteria.  With  history of MRSA pneumonia in the past this would also be of concern.  I would recommend getting an echocardiogram to make sure she does not have any underlying endocarditis.  She needs to be continued on antibiotics until we see resolution of the cavitary lesions.  If there is no resolution then bronchoscopy may have a role.  At this time however I do not think a bronchoscopy will be of help and certainly if we are considering tuberculosis would actually be better to first get a igra assay which has already appropriately been ordered.  In addition for completeness we can get ANA ANCA rheumatoid factor anti-GBM antibodies and if available would also recommend getting a galactomannan. 2. Acute renal failure likely secondary to severe dehydration her creatinine was 1.85 on admission to the hospital. 3. Uncontrolled diabetes patient did have an elevated glucose now is doing better. 4. Hyponatremia again secondary to metabolic derangement would follow-up on labs as per primary care team.  Code Status: Full code Family Communication: No family available Disposition Plan: Per case management   Time spent: 64min  I have personally obtained a history, examined the patient, evaluated laboratory and imaging results, formulated the assessment and plan and  placed orders.  The Patient requires high complexity decision making for assessment and support. Total Time Spent 55min   Zahria Ding A Ailee Pates, MD [facility] Pulmonary Critical Care Medicine Sleep Medicine

## 2019-08-22 NOTE — Consult Note (Signed)
Pharmacy Antibiotic Note  Julia Shea is a 63 y.o. female with medical history of hypertension, diabetes, frequent falls, asthma, admitted on 08/21/2019 with pneumonia. Imaging with cavitary lesions c/f atypical infections. Per chart, patient has reported weight loss, sweats, fevers, chills fatigue. Patient with history of MRSA infection (pneumonia?)- was not able to find personally in chart. UA with many bacteria, >50 RBC, >50 WBC. Pharmacy has been consulted for vancomycin and meropenem dosing. Pulmonology following.    Afebrile x 24h. WBC 17.4. Patient is not hypoxic per chart and is on room air. Creatinine has continued to improve since admission.   Plan: Meropenem 1 g q12h (renally adjusted)  Vancomycin 1250 mg LD x 1 followed by Vancomycin 750 mg IV Q 24 hrs. Goal AUC 400-550. Expected AUC: 515.8, trough 13.6 SCr used: 1.1  Continue to follow daily serum creatinine while on vancomycin and adjust dose as indicated. Levels at steady state.   Height: 5\' 2"  (157.5 cm) Weight: 116 lb 1.6 oz (52.7 kg) IBW/kg (Calculated) : 50.1  Temp (24hrs), Avg:97.7 F (36.5 C), Min:97.5 F (36.4 C), Max:98 F (36.7 C)  Recent Labs  Lab 08/21/19 1327 08/21/19 2313 08/22/19 0813  WBC 20.0* 17.0* 17.4*  CREATININE 1.85* 1.27* 1.10*    Estimated Creatinine Clearance: 40.9 mL/min (A) (by C-G formula based on SCr of 1.1 mg/dL (H)).    Allergies  Allergen Reactions  . Penicillins     Has patient had a PCN reaction causing immediate rash, facial/tongue/throat swelling, SOB or lightheadedness with hypotension: Yes Has patient had a PCN reaction causing severe rash involving mucus membranes or skin necrosis: Yes Has patient had a PCN reaction that required hospitalization: Yes Has patient had a PCN reaction occurring within the last 10 years: Yes If all of the above answers are "NO", then may proceed with Cephalosporin use.  . Sulfa Antibiotics     Antimicrobials this admission: Levofloxacin  11/20 x 1 Meropenem 11/21 >>  Vancomycin 11/21 >>   Dose adjustments this admission: n/a  Microbiology results: 11/20 COVID-19: negative 11/20 BCx: no growth <24h 11/20 UCx: pending  11/21 Sputum: pending 11/21 MRSA PCR: positive 11/21 AFB culture/smear: pending 11/21 Quantiferon-TB: pending  Thank you for allowing pharmacy to be a part of this patient's care.  [place] Resident 08/22/2019 2:42 PM

## 2019-08-22 NOTE — Progress Notes (Signed)
MRSA swab of the nares sent down for testing.

## 2019-08-22 NOTE — Progress Notes (Signed)
    BRIEF OVERNIGHT PROGRESS REPORT   SUBJECTIVE: Patient's nurse called to report persistently low blood pressure 82/51 (57) mm Hg asymptomatic.  OBJECTIVE: He is afebrile with blood pressure 82/51 mm Hg and pulse rate 83 beats/min. There were no focal neurological deficits; he was alert and oriented x4. Most recetn labs at 13:27 reviewed noted with Na 122, Glucose 448, BUN 64 Creatine 1.85  ASSESSMENT:  y.o. female with history of asthma, bilateral hip pain, depression, hypertension, history of MRSA infection, insulin-dependent diabetes mellitus, CKD stage III presenting with nausea, vomiting, generalized weakness and left hip pain  PLAN: Hypotension - Labs CBC, BMP, Mag repeated - Bolus 500 cc given slowly over 2 hours with improvement in BP  2. Hyponatremia -likely due to dehydration. Now improved from 122 to 131 - Check cortisol, TSH,  - Continue  IVFs with NS with goal serum sodium level not > 10 to 12 mEq per L in the first 24 hours and 18 mEq per L in the first 48 hours - Check serial sodium  3. AKI- Prerenal from dehydration - BUN/Cr improving - Continue IVFs - Continue to monitor renal function  Will continue management of current medical issue per primary team.   Rufina Falco, DNP, CCRN, FNP-C [facility] Nurse Practitioner Between 7pm to 7am - Pager [phone]  After 7am go to [email or web address] - [email or web address] select [facility]  [facility] [employer]  [phone]

## 2019-08-22 NOTE — Progress Notes (Addendum)
PROGRESS NOTE    Kennley Ciaravino  [id number] DOB: 10/17/1955 DOA: 08/21/2019 PCP: Patient, No Pcp Per      Brief Narrative:  Catori Gfeller is a 63 y.o. F with HTN, DM II, Depression, frequent falls, and asthma who was admitted 11/20 with N/V, generalized weakness and left hip pain. EMS were called for a welfare check by neighbors, reported finding her lying in feces, urine and possible bedbugs.  Work up in ER consistent with hyponatremia, AKI, hyperglycemia and possible UTI.  Imaging of chest showed multiple cavitary lesions.  Treated with IV abx, fluids and admitted for further evaluation.         Assessment & Plan:  Cavitary Lesions in Left Lung  Multiple large cavitary lesions on non-con CT chest.   Differential includes bacterial infection in setting of possible recurrent aspiration / microaspiration with poor dentition (note D1 diet), malignancy, fungal infection, endocarditis and less likely TB.  -Obtain quantiferon gold, serial AFB -Obtain sputum culture -Broaden to meropenem, vancomycin  -Obtain Echo -Consult pulmonology re: prolonged course abx for cavitary lesions and follow up CT imaging in 4-6 weeks.    -SLP eval and D1 diet   UTI  -follow urine, blood cultures -continue abx, transition to meropenem / vanco for urine & pulmonary coverage     Hypotension  Resolved, BP trend improving after NS bolus.   -caution with volume / avoid overcorrection of Na  Nausea / Vomiting  Suspect in setting of UTI -PRN zofran  -Continue PPI  Hyponatremia  Volume Depletion Admit Na 122, corrected to 131.  Osmolalities suggest degree SIADH. Likely related to poor intake, N/V. TSH wnl.  -Trend serum sodium  -await cortisol -Stop IVF  AKI Hypomagnesemia  Hypokalemia Baseline Sr Cr ~ 1, admit 1.85, CK 138. Improving with fluids. -Trend BMP / UOP  -avoid nephrotoxic agents, renal dose medications  -monitor electrolytes, replace as indicated - K/Mg  11/21  Hyperglycemia DM II  Insulin dependent at baseline.  Glucoses controlled -Continue SSI, sensitive scale  -Continue lantus 10 units QHS   Anemia  Baseline Hgb 10-12, down to 7.4 here.  Microcytic. -Check FOBT -transfuse for Hgb<7%  Possible Left Breast Mass  Not palpated on exam. -Follow up US breast -not noted on CT of chest, may be nipple shadow on CXR   Stage II Decubitus Ulcers bilateral ischia Bilateral buttocks, present on admit  -Wound care per nursing  -pressure relief measures, frequent turning    Moderate to Severe Malnutrition  As evidenced by diffuse loss of muscle mass, fat and 10 pound weight loss. -D1 diet  -Add glucerna TID             MDM and disposition: The below labs and imaging reports reviewed and summarized above.  Medication management as above.   Patient was admitted with sepsis and AKI and encephalopathy.  We are still narrowing on the source, will have pulmonary consultation, further imaging today.    DVT prophylaxis: heparin  Code Status: Full Code  Family Communication: Patient updated on plan of care.     Consultants:   Pulmonary   Procedures:   CT Chest w/o 11/20 >> multiple cavitary lesions, nodules, larger cavitary lesion with air fluid level  US Breast 11/20 >>   Antimicrobials:   Levaquin 11/20 >> 11/21   Vancomycin 11/21 >>   Meropenem 11/21 >>    Subjective: No acute complaints.  Good appetite, afebrile.  Patient is still confused.       Objective: Vitals:  08/21/19 2300 08/22/19 0149 08/22/19 0300 08/22/19 1014  BP: (!) 98/59 100/63  115/69  Pulse: 78 82  93  Resp: 17 17  18   Temp:  (!) 97.5 F (36.4 C)  98 F (36.7 C)  TempSrc:  Oral  Oral  SpO2: 99% 100%  100%  Weight:   52.7 kg   Height:        Intake/Output Summary (Last 24 hours) at 08/22/2019 1439 Last data filed at 08/22/2019 1247 Gross per 24 hour  Intake 4932.31 ml  Output --  Net 4932.31 ml   Filed Weights    08/21/19 1317 08/22/19 0300  Weight: 41.7 kg 52.7 kg      Examination: General appearance: Thin elderly adult female, alert and in no acute distress.  Sitting in bed, eating breakfast. HEENT: Anicteric, conjunctiva pink, lids and lashes normal. No nasal deformity, discharge, epistaxis.  Lips moist, dentition poor.  Many cracked teeth.  OP moist, no oral lesions.  Hearing normal. Skin: Warm and dry.  She has numerous excoriations and scratches on bilateral lower extremities. Breast: Chaperone present, no mass appreciated. Cardiac: RRR, no murmurs appreciated.  JVP normal.  She has brawny change of the bilateral lower extremities, apparent chronic venous insufficiency.    Respiratory: Normal respiratory rate and rhythm.  Diminished throughout both lungs, poor air entry.  No obvious wheezing or rales. Abdomen: Abdomen soft.  No tenderness palpation or guarding. No ascites, distension, hepatosplenomegaly.   MSK: No deformities or effusions of the large joints of the upper or lower extremities bilaterally.  Few severe loss of subcutaneous muscle mass and fat. Neuro: Awake and alert, oriented to self and place but states it is 1957.. Naming is grossly intact, and the patient's recall, recent and remote, as well as general fund of knowledge seem impaired.  Muscle tone decreased, without fasciculations.  Moves all extremities equally with 4/5 strength and with normal coordination.  Marland Kitchen Speech fluent.    Psych: Sensorium intact and responding to questions, attention normal. Affect normal.  Judgment and insight appear impaired.      Data Reviewed: I have personally reviewed following labs and imaging studies:  CBC: Recent Labs  Lab 08/21/19 1327 08/21/19 2313 08/22/19 0813  WBC 20.0* 17.0* 17.4*  NEUTROABS  --  14.3*  --   HGB 9.7* 7.4* 7.4*  HCT 27.6* 21.6* 22.0*  MCV 72.6* 73.7* 76.4*  PLT 436* 335 [id number]   Basic Metabolic Panel: Recent Labs  Lab 08/21/19 1327 08/21/19 2313 08/22/19 0813   NA 122* 131* 129*  K 4.9 3.7 3.3*  CL 86* 104 103  CO2 21* 18* 17*  GLUCOSE 448* 112* 143*  BUN 64* 50* 43*  CREATININE 1.85* 1.27* 1.10*  CALCIUM 9.1 7.7* 7.3*  MG  --  1.6*  --    GFR: Estimated Creatinine Clearance: 40.9 mL/min (A) (by C-G formula based on SCr of 1.1 mg/dL (H)). Liver Function Tests: Recent Labs  Lab 08/21/19 1327  AST 10*  ALT 13  ALKPHOS 171*  BILITOT 0.5  PROT 7.6  ALBUMIN 2.7*   Recent Labs  Lab 08/21/19 1327  LIPASE 20   No results for input(s): AMMONIA in the last 168 hours. Coagulation Profile: No results for input(s): INR, PROTIME in the last 168 hours. Cardiac Enzymes: Recent Labs  Lab 08/21/19 1542  CKTOTAL 138   BNP (last 3 results) No results for input(s): PROBNP in the last 8760 hours. HbA1C: No results for input(s): HGBA1C in the last 72 hours. CBG:  Recent Labs  Lab 08/21/19 1727 08/21/19 1955 08/21/19 2228 08/22/19 0755 08/22/19 1200  GLUCAP 353* 284* 115* 141* 128*   Lipid Profile: No results for input(s): CHOL, HDL, LDLCALC, TRIG, CHOLHDL, LDLDIRECT in the last 72 hours. Thyroid Function Tests: Recent Labs    08/22/19 0813  TSH 1.122   Anemia Panel: No results for input(s): VITAMINB12, FOLATE, FERRITIN, TIBC, IRON, RETICCTPCT in the last 72 hours. Urine analysis:    Component Value Date/Time   COLORURINE YELLOW (A) 08/21/2019 1327   APPEARANCEUR TURBID (A) 08/21/2019 1327   LABSPEC 1.015 08/21/2019 1327   PHURINE 5.0 08/21/2019 1327   GLUCOSEU >=500 (A) 08/21/2019 1327   HGBUR LARGE (A) 08/21/2019 1327   BILIRUBINUR NEGATIVE 08/21/2019 1327   BILIRUBINUR neg 05/15/2013 1038   KETONESUR NEGATIVE 08/21/2019 1327   PROTEINUR 30 (A) 08/21/2019 1327   UROBILINOGEN 0.2 05/15/2013 1038   NITRITE NEGATIVE 08/21/2019 1327   LEUKOCYTESUR LARGE (A) 08/21/2019 1327   Sepsis Labs: @LABRCNTIP (procalcitonin:4,lacticacidven:4)  ) Recent Results (from the past 240 hour(s))  SARS CORONAVIRUS 2 (TAT 6-24 HRS)  Nasopharyngeal Nasopharyngeal Swab     Status: None   Collection Time: 08/21/19  3:42 PM   Specimen: Nasopharyngeal Swab  Result Value Ref Range Status   SARS Coronavirus 2 NEGATIVE NEGATIVE Final    Comment: (NOTE) SARS-CoV-2 target nucleic acids are NOT DETECTED. The SARS-CoV-2 RNA is generally detectable in upper and lower respiratory specimens during the acute phase of infection. Negative results do not preclude SARS-CoV-2 infection, do not rule out co-infections with other pathogens, and should not be used as the sole basis for treatment or other patient management decisions. Negative results must be combined with clinical observations, patient history, and epidemiological information. The expected result is Negative. Fact Sheet for Patients: SugarRoll.be Fact Sheet for Healthcare Providers: https://www.woods-mathews.com/ This test is not yet approved or cleared by the [place] FDA and  has been authorized for detection and/or diagnosis of SARS-CoV-2 by FDA under an Emergency Use Authorization (EUA). This EUA will remain  in effect (meaning this test can be used) for the duration of the COVID-19 declaration under Section 56 4(b)(1) of the Act, 21 U.S.C. section 360bbb-3(b)(1), unless the authorization is terminated or revoked sooner. Performed at [facility], [place] [address] [place], [place] [address]   Blood culture (routine x 2)     Status: None (Preliminary result)   Collection Time: 08/21/19  3:42 PM   Specimen: BLOOD  Result Value Ref Range Status   Specimen Description BLOOD RIGHT ANTECUBITAL  Final   Special Requests   Final    BOTTLES DRAWN AEROBIC AND ANAEROBIC Blood Culture adequate volume   Culture   Final    NO GROWTH < 24 HOURS Performed at [facility], [address] [place], [place] [address]    Report Status PENDING  Incomplete  Blood culture (routine x 2)     Status: None (Preliminary  result)   Collection Time: 08/21/19  3:42 PM   Specimen: BLOOD  Result Value Ref Range Status   Specimen Description BLOOD BLOOD RIGHT FOREARM  Final   Special Requests   Final    BOTTLES DRAWN AEROBIC AND ANAEROBIC Blood Culture adequate volume   Culture   Final    NO GROWTH < 24 HOURS Performed at [facility], [address] [place], [place] [address]    Report Status PENDING  Incomplete  MRSA PCR Screening     Status: Abnormal   Collection  Time: 08/22/19 11:41 AM  Result Value Ref Range Status   MRSA by PCR POSITIVE (A) NEGATIVE Final    Comment:        The GeneXpert MRSA Assay (FDA approved for NASAL specimens only), is one component of a comprehensive MRSA colonization surveillance program. It is not intended to diagnose MRSA infection nor to guide or monitor treatment for MRSA infections. RESULT CALLED TO, READ BACK BY AND VERIFIED WITH: JANE HODGE ON 08/22/2019 AT 1345 TIK Performed at [facility], [address] [place], [place] [address]          Radiology Studies: Ct Chest Wo Contrast  Result Date: 08/21/2019 CLINICAL DATA:  Cough.  Persistent left lung cavitary lesion. EXAM: CT CHEST WITHOUT CONTRAST TECHNIQUE: Multidetector CT imaging of the chest was performed following the standard protocol without IV contrast. COMPARISON:  Prior chest radiographs. FINDINGS: Cardiovascular: Heart is normal in size and configuration. No pericardial effusion. Two vessel coronary artery calcifications. Aortic atherosclerotic calcifications. Mediastinum/Nodes: No enlarged mediastinal or axillary lymph nodes. Thyroid gland, trachea, and esophagus demonstrate no significant findings. Lungs/Pleura: Multiple cavitary lesion/masses and non cavitary nodules. In the left upper lobe, there is a cavitary lesion that measures 5.9 x 3.5 cm transversely, centered on image 49, series 2. Cavities contain dependent fluid. There is hazy airspace opacity along the periphery of  the lesion mostly at superior aspect. In the right upper lobe there is a cavitary mass/lesion measuring 3.6 x 3.5 cm, centered on image 32, series 2. The central cavity contains dependent fluid. There is hazy airspace opacity surrounding the periphery of this lesion. In the posterior right lower lobe there is a cavitary mass/lesion measuring 5.2 x 2.4 cm containing bubbles of air, centered on image 79, series 2. A second cavitary lesion is seen superior to this measuring 2.6 x 1.9 cm. Hazy ground-glass opacity also is seen along the periphery of these lesions. In the left lower lobe abutting the inferior oblique fissure, image 105, series 2, there is a nodule measuring 2.9 x 2.1 cm containing a single small bubble of nondependent air. There are several other lung nodules mostly in the lower lobes. Remainder of the lungs is clear. No evidence of pulmonary edema. No pleural effusion or pneumothorax. Upper Abdomen: No acute abnormality. Musculoskeletal: No fracture or acute finding. No osteoblastic or osteolytic lesions. IMPRESSION: 1. Multiple cavitary masses/lesions and lung nodules as detailed above. Suspect the etiology is infectious/inflammatory. Larger cavitary lesions contain dependent fluid supporting infection. Consider atypical infections including fungal and mycobacterial. Recommend follow-up chest CT after treatment to assess response. 2. No other acute abnormality within the chest. 3. Coronary artery calcifications and aortic atherosclerosis. Aortic Atherosclerosis (ICD10-I70.0). Electronically Signed   By: Lajean Manes M.D.   On: 08/21/2019 18:14   Dg Chest Portable 1 View  Result Date: 08/21/2019 CLINICAL DATA:  Cough. EXAM: PORTABLE CHEST 1 VIEW COMPARISON:  January 23, 2018 FINDINGS: The patient has multiple cavitary lesions in the left lung as well as multiple other poorly defined patchy lesions in the right lung and in the left lung base. The heart size and pulmonary vascularity are normal. No  discrete effusions. Aortic atherosclerosis. No significant bone abnormality. There also is suggestion of a mass in the left breast. IMPRESSION: 1. Multiple cavitary lesions in the left lung and in the left lung base. The differential diagnosis includes cavitary malignancy, septic emboli, and atypical infection. 2. Possible mass in the left breast. 3. Aortic atherosclerosis. Electronically Signed   By: Jeneen Rinks  Maxwell M.D.   On: 08/21/2019 14:20   Dg Hip Unilat With Pelvis 2-3 Views Left  Result Date: 08/21/2019 CLINICAL DATA:  Bilateral hip pain since a fall 1 week ago. Unable to bear weight. EXAM: DG HIP (WITH OR WITHOUT PELVIS) 2-3V LEFT COMPARISON:  Radiographs dated 11/13/2017 FINDINGS: There is no evidence of hip fracture or dislocation. There is no evidence of arthropathy or other focal bone abnormality. IMPRESSION: Negative. Electronically Signed   By: Lorriane Shire M.D.   On: 08/21/2019 14:17        Scheduled Meds:  feeding supplement (GLUCERNA SHAKE)  237 mL Oral TID BM   heparin  5,000 Units Subcutaneous Q8H   insulin aspart  0-9 Units Subcutaneous TID WC   insulin glargine  10 Units Subcutaneous QHS   pantoprazole  40 mg Oral Daily   potassium chloride  40 mEq Oral BID   Continuous Infusions:  lactated ringers 75 mL/hr at 08/22/19 1247   magnesium sulfate bolus IVPB     meropenem (MERREM) IV Stopped (08/22/19 1038)     LOS: 1 day    Time spent: 30 minutes    Noe Gens, AG-ACNP-[facility] 08/22/2019, 2:39 PM     Please page though [place] or Epic secure chat:  For [employer], contact charge nurse    Attending MD Note:  I have seen and examined the patient with NP student and agree with the note above which has been edited to reflect our agreed upon history, exam, and assessment/plan.    I have personally reviewed the orders for the patient, which were made under my direction.        [place]

## 2019-08-22 NOTE — Progress Notes (Signed)
Alert, reports the year is "1957". Generalized weakness.  Poor appetite-states she doesn;t like the food. IVF's continued. MRSA PCR positive with continued airborne and contact precautions. LAB TB gold cannot be done until Monday with MD notified. No cough/SOB. Color pale/sleeps at frequent intervals. Abdomen large/soft.

## 2019-08-22 NOTE — Plan of Care (Signed)

## 2019-08-23 LAB — CBC
HCT: 22.7 % — ABNORMAL LOW (ref 36.0–46.0)
Hemoglobin: 7.8 g/dL — ABNORMAL LOW (ref 12.0–15.0)
MCH: 25.6 pg — ABNORMAL LOW (ref 26.0–34.0)
MCHC: 34.4 g/dL (ref 30.0–36.0)
MCV: 74.4 fL — ABNORMAL LOW (ref 80.0–100.0)
Platelets: 379 10*3/uL (ref 150–400)
RBC: 3.05 MIL/uL — ABNORMAL LOW (ref 3.87–5.11)
RDW: 13.9 % (ref 11.5–15.5)
WBC: 14.2 10*3/uL — ABNORMAL HIGH (ref 4.0–10.5)
nRBC: 0.1 % (ref 0.0–0.2)

## 2019-08-23 LAB — BASIC METABOLIC PANEL
Anion gap: 7 (ref 5–15)
BUN: 31 mg/dL — ABNORMAL HIGH (ref 8–23)
CO2: 18 mmol/L — ABNORMAL LOW (ref 22–32)
Calcium: 8.1 mg/dL — ABNORMAL LOW (ref 8.9–10.3)
Chloride: 108 mmol/L (ref 98–111)
Creatinine, Ser: 0.96 mg/dL (ref 0.44–1.00)
GFR calc Af Amer: >60 mL/min (ref >60)
GFR calc non Af Amer: >60 mL/min (ref >60)
Glucose, Bld: 130 mg/dL — ABNORMAL HIGH (ref 70–99)
Potassium: 4.3 mmol/L (ref 3.5–5.1)
Sodium: 133 mmol/L — ABNORMAL LOW (ref 135–145)

## 2019-08-23 LAB — URINE CULTURE: Culture: >=100000 — AB

## 2019-08-23 LAB — GLUCOSE, CAPILLARY
Glucose-Capillary: 108 mg/dL — ABNORMAL HIGH (ref 70–99)
Glucose-Capillary: 231 mg/dL — ABNORMAL HIGH (ref 70–99)
Glucose-Capillary: 228 mg/dL — ABNORMAL HIGH (ref 70–99)
Glucose-Capillary: 255 mg/dL — ABNORMAL HIGH (ref 70–99)

## 2019-08-23 LAB — EXPECTORATED SPUTUM ASSESSMENT W GRAM STAIN, RFLX TO RESP C

## 2019-08-23 LAB — FOLATE: Folate: 4.3 ng/mL — ABNORMAL LOW (ref >5.9)

## 2019-08-23 LAB — VITAMIN B12: Vitamin B-12: 623 pg/mL (ref 180–914)

## 2019-08-23 MED ORDER — SODIUM CHLORIDE 3 % IN NEBU
4.0000 mL | INHALATION_SOLUTION | Freq: Every day | RESPIRATORY_TRACT | Status: DC
  Administered 2019-08-24 – 2019-08-25 (×2): 4 mL via RESPIRATORY_TRACT
  Filled 2019-08-23 (×3): qty 4

## 2019-08-23 NOTE — Progress Notes (Signed)
Initial Nutrition Assessment  DOCUMENTATION CODES:   Not applicable  INTERVENTION:  Patient is pending SLP evaluation. Dysphagia 1 diet likely not necessary in setting of edentulous status as patient has been edentulous for a long time per her report and can manage mechanical soft texture well. Will monitor outcome of SLP evaluation.  Continue Glucerna Shake po TID, each supplement provides 220 kcal and 10 grams of protein. Patient prefers strawberry.  Provide daily MVI.  Consider addition of bowel regimen as patient's last BM was 08/17/2019 per chart.  NUTRITION DIAGNOSIS:   Inadequate oral intake related to decreased appetite as evidenced by per patient/family report.  GOAL:   Patient will meet greater than or equal to 90% of their needs  MONITOR:   PO intake, Supplement acceptance, Diet advancement, Labs, Weight trends, Skin, I & O's  REASON FOR ASSESSMENT:   Malnutrition Screening Tool    ASSESSMENT:   63 year old female with PMHx of HTN, depression, asthma, DM admitted after being found down by neighbors and found to have cavitary lesions in left lung undergoing work-up, UTI, hypotension, N/V, AKI, hyperglycemia, anemia, possible left breast mass.   Patient is on airborne precautions. Spoke with her over the phone. She reports she had a decreased appetite and PO intake for 2-3 weeks prior to admission. She is unable to describe her exact intake but reports she was not eating well. Yesterday she was only taking bites at meals. She reports she did better with breakfast today and ate some grits and pureed pancakes. Patient is unsure why she is on pureed diet. She reports she has been edentulous for a long time and can manage a mostly regular (though sounds more like mechanical soft) diet well. Discussed that she is pending a SLP evaluation and her diet may be able to be upgraded following that evaluation. She reports she enjoys the Glucerna and is drinking the Soil scientist.  Discussed importance of ONS to help meet calorie/protein needs.  Patient reports her UBW was 145 lbs and she has been losing weight over time. According to chart she has been weight-stable for at least the past year. She is currently 52.7 kg (116.1 lbs).  Medications reviewed and include: Novolog 0-9 units TID, Lantus 10 units QHS, pantoprazole, LR at 75 mL/hr, meropenem, vancomycin.  Labs reviewed: CBG 108-168, Sodium 133, CO2 18, BUN 31.  Patient is at risk for malnutrition. Unable to determine if she meets criteria without completing NFPE.  NUTRITION - FOCUSED PHYSICAL EXAM:  Unable to complete at this time.  Diet Order:   Diet Order            DIET - DYS 1 Room service appropriate? Yes; Fluid consistency: Thin  Diet effective now             EDUCATION NEEDS:   No education needs have been identified at this time  Skin:  Skin Assessment: Skin Integrity Issues:(wounds bilateral buttocks (1cm x 1cm))  Last BM:  08/17/2019 per chart  Height:   Ht Readings from Last 1 Encounters:  08/21/19 5\' 2"  (1.575 m)   Weight:   Wt Readings from Last 1 Encounters:  08/22/19 52.7 kg   Ideal Body Weight:  50 kg  BMI:  Body mass index is 21.23 kg/m.  Estimated Nutritional Needs:   Kcal:  1400-1600  Protein:  70-80 grams  Fluid:  1.4-1.6 L/day  Jacklynn Barnacle, MS, RD, LDN Office: [phone] Pager: [phone] After Hours/Weekend Pager: [phone]

## 2019-08-23 NOTE — Progress Notes (Addendum)
PROGRESS NOTE    Julia Shea  [id number] DOB: 10/25/1955 DOA: 08/21/2019 PCP: Patient, No Pcp Per      Brief Narrative:  Julia Shea is a 63 y.o. F with HTN, DM II, Depression, frequent falls, and asthma who was admitted 11/20 with N/V, generalized weakness and left hip pain. EMS were called for a welfare check by neighbors, reported finding her lying in feces, urine and possible bedbugs.    Work up in ER consistent with hyponatremia, AKI, hyperglycemia and possible UTI.  Imaging of chest showed multiple cavitary lesions.  Treated with IV abx, fluids and admitted for further evaluation.         Assessment & Plan:  Cavitary Lesions in Left Lung  Multiple large cavitary lesions on non-con CT chest.  MRSA PCR positive.   Differential includes bacterial infection in setting of possible recurrent aspiration / microaspiration with poor dentition (note D1 diet), malignancy, fungal infection, endocarditis and less likely TB.  -Follow quantiferon gold, sputum culture -Obtain AFB  -Continue meropenem, vancomycin while inpatient  -narrow abx based on cultures > if negative consider augmentin PO for discharge  -Will need follow up CT imaging in 4-6 weeks -Obtain ECHO  -appreciate Pulmonary consultation > rec's for ANA, ANCA, RF, Anti-GBM and galactomannan & prolonged abx course. No role for FOB at this time. -Appreciate SLP evaluation, D1 diet  -will need dental evaluation post discharge   Pyuria/bacteriuria Covered  Hypotension  Resolved with fluids.   -monitor Na closely    Hyponatremia  Volume Depletion Admit Na 122, corrected to 131.  Osmolalities suggest degree SIADH. Likely related to poor intake as well.  TSH normal, cortisol normal.  Asymptomatic.  To 133 today.  AKI Hypomagnesemia  Hypokalemia Baseline Sr Cr ~ 1, admit 1.85, CK 138.  Resolved with fluids.   Hyperglycemia DM II  Insulin dependent at baseline.  Glucose range 100-170.  Glucoses well  controlled. -Continue SSI, sensitive scale  -Continue Lantus 10 units QHS   Microcytic Anemia  Baseline Hgb 10-12, down to 7.4.   Stable today, no clinical bleeding. -Follow FOBT  -Check ferritin, TIBC -Transfuse for Hgb <7%  Possible Left Breast Mass  Not palpated on exam. Suspect nipple shadow on CXR, not seen on CT -Obtain US left breast   Stage II Decubitus Ulcers bilateral ischia Bilateral buttocks, present on admit  -WOC appreciated  -pressure relief measures, frequent turning  -PT consult    Moderate to Severe Malnutrition  As evidenced by diffuse loss of muscle mass, fat and 10 pound weight loss. -D1 diet  -glucerna TID  Dispo -SW/CM consult to assist with possible placement needs         MDM and disposition: The below labs and imaging reports reviewed and summarized above.  Medication management as above.   The patient was admitted with encephalopathy from sepsis from cavitary lung infection.  At baseline she is independent for all self-cares, but currently is requiring assistance to get out of bed, and ambulate.  She will likely require significant rehab before she can return to her prior level of function.      DVT prophylaxis: heparin  Code Status: Full Code  Family Communication: Patient updated on plan of care.     Consultants:   Pulmonary   Procedures:   CT Chest w/o 11/20 >> multiple cavitary lesions, nodules, larger cavitary lesion with air fluid level  US Breast 11/20 >>    ECHO 11/22 >>  Antimicrobials:   Levaquin 11/20 >> 11/21  Vancomycin 11/21 >>   Meropenem 11/21 >>    Subjective: Patient states she is feels better, appetite good.  Afebrile.  No respiratory distress.    Unable to get quantiferon gold draw until 11/23.  No acute events overnight.      Objective: Vitals:   08/22/19 1014 08/22/19 1702 08/22/19 2339 08/23/19 1030  BP: 115/69 95/60 104/66 110/74  Pulse: 93 93 92 92  Resp: 18 18 19 16   Temp: 98 F  (36.7 C) 98.1 F (36.7 C) 98 F (36.7 C) 98.2 F (36.8 C)  TempSrc: Oral Oral Oral Oral  SpO2: 100% 99% 100% 100%  Weight:      Height:        Intake/Output Summary (Last 24 hours) at 08/23/2019 1415 Last data filed at 08/23/2019 1008 Gross per 24 hour  Intake 2020.42 ml  Output 400 ml  Net 1620.42 ml   Filed Weights   08/21/19 1317 08/22/19 0300  Weight: 41.7 kg 52.7 kg      Examination: General appearance: Chronically ill-appearing adult female, alert and in no obvious distress.  Sitting in bed, eating breakfast. HEENT: Anicteric, conjunctiva pink, lids and lashes normal. No nasal deformity, discharge, epistaxis.  Lips moist, dentition in very poor repair, oropharynx moist, no oral lesions, hearing normal   Skin: Warm and dry.  No suspicious rashes or lesions. Cardiac: RRR, no murmurs appreciated.  No LE edema.    Respiratory: Normal respiratory rate and rhythm.  Diminished bilaterally, CTAB without rales or wheezes. Abdomen: Abdomen soft.  No tenderness palpation or guarding. No ascites, distension, hepatosplenomegaly.   MSK: No deformities or effusions of the large joints of the upper or lower extremities bilaterally.  Diffuse loss of some of subcutaneous muscle mass and fat. Neuro: Awake and alert. Naming is grossly intact, and the patient's recall, recent and remote, as well as general fund of knowledge seem within normal limits.  Muscle tone diminished, without fasciculations.  Moves all extremities generalized weakness but with ormal coordination.  Marland Kitchen Speech fluent.    Psych: Sensorium intact and responding to questions, attention normal. Affect normal.  Judgment and insight appear normal.           Data Reviewed: I have personally reviewed following labs and imaging studies:  CBC: Recent Labs  Lab 08/21/19 1327 08/21/19 2313 08/22/19 0813 08/23/19 0421  WBC 20.0* 17.0* 17.4* 14.2*  NEUTROABS  --  14.3*  --   --   HGB 9.7* 7.4* 7.4* 7.8*  HCT 27.6* 21.6*  22.0* 22.7*  MCV 72.6* 73.7* 76.4* 74.4*  PLT 436* 335 366 [id number]   Basic Metabolic Panel: Recent Labs  Lab 08/21/19 1327 08/21/19 2313 08/22/19 0813 08/23/19 0421  NA 122* 131* 129* 133*  K 4.9 3.7 3.3* 4.3  CL 86* 104 103 108  CO2 21* 18* 17* 18*  GLUCOSE 448* 112* 143* 130*  BUN 64* 50* 43* 31*  CREATININE 1.85* 1.27* 1.10* 0.96  CALCIUM 9.1 7.7* 7.3* 8.1*  MG  --  1.6*  --   --    GFR: Estimated Creatinine Clearance: 46.8 mL/min (by C-G formula based on SCr of 0.96 mg/dL). Liver Function Tests: Recent Labs  Lab 08/21/19 1327  AST 10*  ALT 13  ALKPHOS 171*  BILITOT 0.5  PROT 7.6  ALBUMIN 2.7*   Recent Labs  Lab 08/21/19 1327  LIPASE 20   No results for input(s): AMMONIA in the last 168 hours. Coagulation Profile: No results for input(s): INR, PROTIME in the last  168 hours. Cardiac Enzymes: Recent Labs  Lab 08/21/19 1542  CKTOTAL 138   BNP (last 3 results) No results for input(s): PROBNP in the last 8760 hours. HbA1C: No results for input(s): HGBA1C in the last 72 hours. CBG: Recent Labs  Lab 08/22/19 1200 08/22/19 1700 08/22/19 2121 08/23/19 0736 08/23/19 1135  GLUCAP 128* 168* 168* 108* 231*   Lipid Profile: No results for input(s): CHOL, HDL, LDLCALC, TRIG, CHOLHDL, LDLDIRECT in the last 72 hours. Thyroid Function Tests: Recent Labs    08/22/19 0813  TSH 1.122   Anemia Panel: Recent Labs    08/23/19 1000  FOLATE 4.3*   Urine analysis:    Component Value Date/Time   COLORURINE YELLOW (A) 08/21/2019 1327   APPEARANCEUR TURBID (A) 08/21/2019 1327   LABSPEC 1.015 08/21/2019 1327   PHURINE 5.0 08/21/2019 1327   GLUCOSEU >=500 (A) 08/21/2019 1327   HGBUR LARGE (A) 08/21/2019 1327   BILIRUBINUR NEGATIVE 08/21/2019 1327   BILIRUBINUR neg 05/15/2013 1038   KETONESUR NEGATIVE 08/21/2019 1327   PROTEINUR 30 (A) 08/21/2019 1327   UROBILINOGEN 0.2 05/15/2013 1038   NITRITE NEGATIVE 08/21/2019 1327   LEUKOCYTESUR LARGE (A) 08/21/2019 1327    Sepsis Labs: @LABRCNTIP (procalcitonin:4,lacticacidven:4)  ) Recent Results (from the past 240 hour(s))  Urine culture     Status: Abnormal   Collection Time: 08/21/19  1:27 PM   Specimen: Urine, Random  Result Value Ref Range Status   Specimen Description   Final    URINE, RANDOM Performed at [facility], [address] [place], [place] [address]    Special Requests   Final    NONE Performed at [facility], [address] [place], [place] [address]    Culture (A)  Final    >=100,000 COLONIES/mL LACTOBACILLUS SPECIES Standardized susceptibility testing for this organism is not available. Performed at [facility], [place] [address] [place], [place] [address]    Report Status 08/23/2019 FINAL  Final  SARS CORONAVIRUS 2 (TAT 6-24 HRS) Nasopharyngeal Nasopharyngeal Swab     Status: None   Collection Time: 08/21/19  3:42 PM   Specimen: Nasopharyngeal Swab  Result Value Ref Range Status   SARS Coronavirus 2 NEGATIVE NEGATIVE Final    Comment: (NOTE) SARS-CoV-2 target nucleic acids are NOT DETECTED. The SARS-CoV-2 RNA is generally detectable in upper and lower respiratory specimens during the acute phase of infection. Negative results do not preclude SARS-CoV-2 infection, do not rule out co-infections with other pathogens, and should not be used as the sole basis for treatment or other patient management decisions. Negative results must be combined with clinical observations, patient history, and epidemiological information. The expected result is Negative. Fact Sheet for Patients: SugarRoll.be Fact Sheet for Healthcare Providers: https://www.woods-mathews.com/ This test is not yet approved or cleared by the [place] FDA and  has been authorized for detection and/or diagnosis of SARS-CoV-2 by FDA under an Emergency Use Authorization (EUA). This EUA will remain  in effect (meaning this test can be used)  for the duration of the COVID-19 declaration under Section 56 4(b)(1) of the Act, 21 U.S.C. section 360bbb-3(b)(1), unless the authorization is terminated or revoked sooner. Performed at [facility], [place] [address], [place], [place] [address]   Blood culture (routine x 2)     Status: None (Preliminary result)   Collection Time: 08/21/19  3:42 PM   Specimen: BLOOD  Result Value Ref Range Status   Specimen Description BLOOD RIGHT ANTECUBITAL  Final  Special Requests   Final    BOTTLES DRAWN AEROBIC AND ANAEROBIC Blood Culture adequate volume   Culture   Final    NO GROWTH 2 DAYS Performed at [facility], [address] [place], [place] [address]    Report Status PENDING  Incomplete  Blood culture (routine x 2)     Status: None (Preliminary result)   Collection Time: 08/21/19  3:42 PM   Specimen: BLOOD  Result Value Ref Range Status   Specimen Description BLOOD BLOOD RIGHT FOREARM  Final   Special Requests   Final    BOTTLES DRAWN AEROBIC AND ANAEROBIC Blood Culture adequate volume   Culture   Final    NO GROWTH 2 DAYS Performed at [facility], [address] [place], [place] [address]    Report Status PENDING  Incomplete  MRSA PCR Screening     Status: Abnormal   Collection Time: 08/22/19 11:41 AM  Result Value Ref Range Status   MRSA by PCR POSITIVE (A) NEGATIVE Final    Comment:        The GeneXpert MRSA Assay (FDA approved for NASAL specimens only), is one component of a comprehensive MRSA colonization surveillance program. It is not intended to diagnose MRSA infection nor to guide or monitor treatment for MRSA infections. RESULT CALLED TO, READ BACK BY AND VERIFIED WITH: JANE HODGE ON 08/22/2019 AT 1345 TIK Performed at [facility], [address] [place], [place] [address]   Expectorated sputum assessment w rflx to resp cult     Status: None   Collection Time: 08/23/19 10:00 AM   Specimen: Sputum  Result Value Ref  Range Status   Specimen Description SPUTUM  Final   Special Requests NONE  Final   Sputum evaluation   Final    Sputum specimen not acceptable for testing.  Please recollect.   C/CHELSEA KNIGHT AT [id number] 08/23/2019.PMF Performed at [facility], [address] [place],  [address]    Report Status 08/23/2019 FINAL  Final         Radiology Studies: Ct Chest Wo Contrast  Result Date: 08/21/2019 CLINICAL DATA:  Cough.  Persistent left lung cavitary lesion. EXAM: CT CHEST WITHOUT CONTRAST TECHNIQUE: Multidetector CT imaging of the chest was performed following the standard protocol without IV contrast. COMPARISON:  Prior chest radiographs. FINDINGS: Cardiovascular: Heart is normal in size and configuration. No pericardial effusion. Two vessel coronary artery calcifications. Aortic atherosclerotic calcifications. Mediastinum/Nodes: No enlarged mediastinal or axillary lymph nodes. Thyroid gland, trachea, and esophagus demonstrate no significant findings. Lungs/Pleura: Multiple cavitary lesion/masses and non cavitary nodules. In the left upper lobe, there is a cavitary lesion that measures 5.9 x 3.5 cm transversely, centered on image 49, series 2. Cavities contain dependent fluid. There is hazy airspace opacity along the periphery of the lesion mostly at superior aspect. In the right upper lobe there is a cavitary mass/lesion measuring 3.6 x 3.5 cm, centered on image 32, series 2. The central cavity contains dependent fluid. There is hazy airspace opacity surrounding the periphery of this lesion. In the posterior right lower lobe there is a cavitary mass/lesion measuring 5.2 x 2.4 cm containing bubbles of air, centered on image 79, series 2. A second cavitary lesion is seen superior to this measuring 2.6 x 1.9 cm. Hazy ground-glass opacity also is seen along the periphery of these lesions. In the left lower lobe abutting the inferior oblique fissure, image 105, series 2, there is a nodule  measuring 2.9 x  2.1 cm containing a single small bubble of nondependent air. There are several other lung nodules mostly in the lower lobes. Remainder of the lungs is clear. No evidence of pulmonary edema. No pleural effusion or pneumothorax. Upper Abdomen: No acute abnormality. Musculoskeletal: No fracture or acute finding. No osteoblastic or osteolytic lesions. IMPRESSION: 1. Multiple cavitary masses/lesions and lung nodules as detailed above. Suspect the etiology is infectious/inflammatory. Larger cavitary lesions contain dependent fluid supporting infection. Consider atypical infections including fungal and mycobacterial. Recommend follow-up chest CT after treatment to assess response. 2. No other acute abnormality within the chest. 3. Coronary artery calcifications and aortic atherosclerosis. Aortic Atherosclerosis (ICD10-I70.0). Electronically Signed   By: Lajean Manes M.D.   On: 08/21/2019 18:14        Scheduled Meds: . feeding supplement (GLUCERNA SHAKE)  237 mL Oral TID BM  . heparin  5,000 Units Subcutaneous Q8H  . insulin aspart  0-9 Units Subcutaneous TID WC  . insulin glargine  10 Units Subcutaneous QHS  . pantoprazole  40 mg Oral Daily  . sodium chloride HYPERTONIC  4 mL Nebulization Daily   Continuous Infusions: . sodium chloride Stopped (08/23/19 0115)  . lactated ringers 75 mL/hr at 08/23/19 1233  . meropenem (MERREM) IV 1 g (08/23/19 1022)  . vancomycin       LOS: 2 days    Time spent: 30 minutes    Noe Gens, AG-ACNP-[facility] 08/23/2019, 2:15 PM     Please page though [place] or Epic secure chat:  For [employer], contact charge nurse         Attending MD Note:  I have seen and examined the patient with NP student and agree with the note above which has been edited to reflect our agreed upon history, exam, and assessment/plan.    I have personally reviewed the orders for the patient, which were made under  my direction.       [place]

## 2019-08-23 NOTE — Progress Notes (Signed)
Physical Therapy Evaluation Patient Details Name: Julia Shea MRN: [id number] DOB: 1955-11-17 Today's Date: 08/23/2019   History of Present Illness  Julia Shea is a 63 y.o. F with HTN, DM II, Depression, frequent falls, and asthma who was admitted 11/20 with N/V, generalized weakness and left hip pain. EMS were called for a welfare check by neighbors, reported finding her lying in feces, urine and possible bedbugs.   Clinical Impression  Patient is apprehensive about PT Eval and initially thinks she is not able to participate. She has anxiety and fear towards movement and mobility and thinks she might fall out of bed. She has decreased strength BLE 2/5 hip flex and abd, decreased ROM B knees with extension contracture aprox -30 deg's bilaterally. She needs max assist for supine<>sit bed mobility but is able to sit at the edge of bed with UE support. She is not able to perform sit <> stand but did give good effort towards trying to stand with RW. She needs total assist to reposition her in bed and she becomes very fearful and anxious, tearful that she is not abel to move on her own. She would benefit from OT eval and continued skilled PT to improve strength and mobility.     Follow Up Recommendations SNF    Equipment Recommendations  Rolling walker with 5" wheels    Recommendations for Other Services OT consult     Precautions / Restrictions Restrictions Weight Bearing Restrictions: No      Mobility  Bed Mobility Overal bed mobility: Needs Assistance Bed Mobility: Supine to Sit;Sit to Supine     Supine to sit: Min assist Sit to supine: Mod assist   General bed mobility comments: needs encouragement due to fear/anxiety  Transfers Overall transfer level: (unable to stand with max assist)                  Ambulation/Gait                Stairs            Wheelchair Mobility    Modified Rankin (Stroke Patients Only)       Balance Overall balance  assessment: Needs assistance Sitting-balance support: Bilateral upper extremity supported;Feet unsupported Sitting balance-Leahy Scale: Fair Sitting balance - Comments: (feet dangle with bed in lowest position)                                     Pertinent Vitals/Pain Pain Assessment: No/denies pain    Home Living Family/patient expects to be discharged to:: Unsure Living Arrangements: Alone                    Prior Function Level of Independence: Independent               Hand Dominance        Extremity/Trunk Assessment   Upper Extremity Assessment Upper Extremity Assessment: Generalized weakness    Lower Extremity Assessment Lower Extremity Assessment: RLE deficits/detail;LLE deficits/detail RLE Deficits / Details: (2/5 hip flex/abd, knee ext contracture aprox -30 deg) LLE Deficits / Details: ( hip flex/abd 2/5, knee ext contracture aprox -30 degs)       Communication   Communication: No difficulties  Cognition Arousal/Alertness: Awake/alert Behavior During Therapy: Anxious Overall Cognitive Status: Within Functional Limits for tasks assessed  General Comments: (fearful of movement, tearful,)      General Comments      Exercises     Assessment/Plan    PT Assessment Patient needs continued PT services  PT Problem List Decreased strength;Decreased range of motion;Decreased activity tolerance;Decreased balance;Decreased mobility;Pain       PT Treatment Interventions Gait training;Functional mobility training;Therapeutic activities;Therapeutic exercise;Balance training    PT Goals (Current goals can be found in the Care Plan section)  Acute Rehab PT Goals Patient Stated Goal: to get stronger PT Goal Formulation: With patient Time For Goal Achievement: 09/06/19 Potential to Achieve Goals: Fair    Frequency Min 2X/week   Barriers to discharge Decreased caregiver support       Co-evaluation               AM-PAC PT "6 Clicks" Mobility  Outcome Measure Help needed turning from your back to your side while in a flat bed without using bedrails?: A Lot Help needed moving from lying on your back to sitting on the side of a flat bed without using bedrails?: A Lot Help needed moving to and from a bed to a chair (including a wheelchair)?: Total Help needed standing up from a chair using your arms (e.g., wheelchair or bedside chair)?: Total Help needed to walk in hospital room?: Total Help needed climbing 3-5 steps with a railing? : Total 6 Click Score: 8    End of Session Equipment Utilized During Treatment: Gait belt Activity Tolerance: Patient limited by fatigue Patient left: in bed;with bed alarm set Nurse Communication: Mobility status PT Visit Diagnosis: Muscle weakness (generalized) (M62.81);History of falling (Z91.81);Difficulty in walking, not elsewhere classified (R26.2);Pain Pain - Right/Left: Left Pain - part of body: Hip    Time: 1430-1455 PT Time Calculation (min) (ACUTE ONLY): 25 min   Charges:   PT Evaluation $PT Eval Moderate Complexity: 1 Mod PT Treatments $Therapeutic Exercise: 8-22 mins          Alanson Puls , PT DPT 08/23/2019, 3:21 PM

## 2019-08-24 ENCOUNTER — Inpatient Hospital Stay
Admit: 2019-08-24 | Discharge: 2019-08-24 | Disposition: A | Discharge status: Home / Self Care | Payer: Medicare Other | Attending: Internal Medicine | Admitting: Internal Medicine

## 2019-08-24 DIAGNOSIS — K0889 Other specified disorders of teeth and supporting structures: Secondary | ICD-10-CM

## 2019-08-24 DIAGNOSIS — N390 Urinary tract infection, site not specified: Secondary | ICD-10-CM

## 2019-08-24 DIAGNOSIS — E11649 Type 2 diabetes mellitus with hypoglycemia without coma: Secondary | ICD-10-CM

## 2019-08-24 DIAGNOSIS — R531 Weakness: Secondary | ICD-10-CM

## 2019-08-24 DIAGNOSIS — N179 Acute kidney failure, unspecified: Secondary | ICD-10-CM

## 2019-08-24 DIAGNOSIS — Z880 Allergy status to penicillin: Secondary | ICD-10-CM

## 2019-08-24 DIAGNOSIS — D72829 Elevated white blood cell count, unspecified: Secondary | ICD-10-CM

## 2019-08-24 DIAGNOSIS — R112 Nausea with vomiting, unspecified: Secondary | ICD-10-CM

## 2019-08-24 DIAGNOSIS — Z22322 Carrier or suspected carrier of Methicillin resistant Staphylococcus aureus: Secondary | ICD-10-CM

## 2019-08-24 DIAGNOSIS — D649 Anemia, unspecified: Secondary | ICD-10-CM

## 2019-08-24 DIAGNOSIS — N183 Chronic kidney disease, stage 3 unspecified: Secondary | ICD-10-CM

## 2019-08-24 DIAGNOSIS — Z591 Inadequate housing: Secondary | ICD-10-CM

## 2019-08-24 DIAGNOSIS — I129 Hypertensive chronic kidney disease with stage 1 through stage 4 chronic kidney disease, or unspecified chronic kidney disease: Secondary | ICD-10-CM

## 2019-08-24 DIAGNOSIS — Z881 Allergy status to other antibiotic agents status: Secondary | ICD-10-CM

## 2019-08-24 DIAGNOSIS — Z8701 Personal history of pneumonia (recurrent): Secondary | ICD-10-CM

## 2019-08-24 DIAGNOSIS — R918 Other nonspecific abnormal finding of lung field: Secondary | ICD-10-CM

## 2019-08-24 DIAGNOSIS — E1122 Type 2 diabetes mellitus with diabetic chronic kidney disease: Secondary | ICD-10-CM

## 2019-08-24 DIAGNOSIS — M25552 Pain in left hip: Secondary | ICD-10-CM

## 2019-08-24 DIAGNOSIS — Z8614 Personal history of Methicillin resistant Staphylococcus aureus infection: Secondary | ICD-10-CM

## 2019-08-24 LAB — CBC
HCT: 21.5 % — ABNORMAL LOW (ref 36.0–46.0)
Hemoglobin: 7.2 g/dL — ABNORMAL LOW (ref 12.0–15.0)
MCH: 25 pg — ABNORMAL LOW (ref 26.0–34.0)
MCHC: 33.5 g/dL (ref 30.0–36.0)
MCV: 74.7 fL — ABNORMAL LOW (ref 80.0–100.0)
Platelets: 357 10*3/uL (ref 150–400)
RBC: 2.88 MIL/uL — ABNORMAL LOW (ref 3.87–5.11)
RDW: 14.2 % (ref 11.5–15.5)
WBC: 14.1 10*3/uL — ABNORMAL HIGH (ref 4.0–10.5)
nRBC: 0 % (ref 0.0–0.2)

## 2019-08-24 LAB — IRON AND TIBC
Iron: 15 ug/dL — ABNORMAL LOW (ref 28–170)
Saturation Ratios: 12 % (ref 10.4–31.8)
TIBC: 122 ug/dL — ABNORMAL LOW (ref 250–450)
UIBC: 107 ug/dL

## 2019-08-24 LAB — BLOOD GAS, VENOUS
Acid-base deficit: 5.4 mmol/L — ABNORMAL HIGH (ref 0.0–2.0)
Bicarbonate: 20 mmol/L (ref 20.0–28.0)
O2 Saturation: 9.6 %
Patient temperature: 37
pCO2, Ven: 38 mmHg — ABNORMAL LOW (ref 44.0–60.0)
pH, Ven: 7.33 (ref 7.250–7.430)

## 2019-08-24 LAB — GLUCOSE, CAPILLARY
Glucose-Capillary: 133 mg/dL — ABNORMAL HIGH (ref 70–99)
Glucose-Capillary: 178 mg/dL — ABNORMAL HIGH (ref 70–99)
Glucose-Capillary: 156 mg/dL — ABNORMAL HIGH (ref 70–99)
Glucose-Capillary: 158 mg/dL — ABNORMAL HIGH (ref 70–99)

## 2019-08-24 LAB — EXPECTORATED SPUTUM ASSESSMENT W GRAM STAIN, RFLX TO RESP C

## 2019-08-24 LAB — BASIC METABOLIC PANEL
Anion gap: 5 (ref 5–15)
BUN: 27 mg/dL — ABNORMAL HIGH (ref 8–23)
CO2: 19 mmol/L — ABNORMAL LOW (ref 22–32)
Calcium: 7.9 mg/dL — ABNORMAL LOW (ref 8.9–10.3)
Chloride: 108 mmol/L (ref 98–111)
Creatinine, Ser: 0.79 mg/dL (ref 0.44–1.00)
GFR calc Af Amer: >60 mL/min (ref >60)
GFR calc non Af Amer: >60 mL/min (ref >60)
Glucose, Bld: 209 mg/dL — ABNORMAL HIGH (ref 70–99)
Potassium: 4.1 mmol/L (ref 3.5–5.1)
Sodium: 132 mmol/L — ABNORMAL LOW (ref 135–145)

## 2019-08-24 LAB — HEMOGLOBIN A1C
Hgb A1c MFr Bld: 14.6 % — ABNORMAL HIGH (ref 4.8–5.6)
Mean Plasma Glucose: 372 mg/dL

## 2019-08-24 LAB — ECHOCARDIOGRAM COMPLETE
Height: 62 in
Weight: 1857.6 oz

## 2019-08-24 LAB — RETICULOCYTES
Immature Retic Fract: 14.5 % (ref 2.3–15.9)
RBC.: 2.88 MIL/uL — ABNORMAL LOW (ref 3.87–5.11)
Retic Count, Absolute: 46.9 10*3/uL (ref 19.0–186.0)
Retic Ct Pct: 1.6 % (ref 0.4–3.1)

## 2019-08-24 LAB — FERRITIN: Ferritin: 257 ng/mL (ref 11–307)

## 2019-08-24 LAB — MAGNESIUM: Magnesium: 2 mg/dL (ref 1.7–2.4)

## 2019-08-24 MED ORDER — MUPIROCIN 2 % EX OINT
1.0000 "application " | TOPICAL_OINTMENT | Freq: Two times a day (BID) | CUTANEOUS | Status: AC
  Administered 2019-08-24 – 2019-08-28 (×10): 1 via NASAL
  Filled 2019-08-24: qty 22

## 2019-08-24 MED ORDER — SODIUM CHLORIDE 0.9 % IV SOLN
1.0000 g | Freq: Three times a day (TID) | INTRAVENOUS | Status: DC
  Administered 2019-08-24 – 2019-08-25 (×4): 1 g via INTRAVENOUS
  Filled 2019-08-24 (×6): qty 1

## 2019-08-24 MED ORDER — VANCOMYCIN HCL IN DEXTROSE 1-5 GM/200ML-% IV SOLN
1000.0000 mg | INTRAVENOUS | Status: DC
  Administered 2019-08-24 – 2019-08-26 (×3): 1000 mg via INTRAVENOUS
  Filled 2019-08-24 (×4): qty 200

## 2019-08-24 MED ORDER — CHLORHEXIDINE GLUCONATE CLOTH 2 % EX PADS
6.0000 | MEDICATED_PAD | Freq: Every day | CUTANEOUS | Status: AC
  Administered 2019-08-24 – 2019-08-28 (×5): 6 via TOPICAL

## 2019-08-24 NOTE — Progress Notes (Signed)
PROGRESS NOTE    Julia Shea  [id number] DOB: Mar 31, 1956 DOA: 08/21/2019 PCP: Patient, No Pcp Per      Brief Narrative:  Julia Shea is a 63 y.o. F with HTN, DM II, Depression, frequent falls, and asthma who was admitted 11/20 with N/V, generalized weakness and left hip pain. EMS were called for a welfare check by neighbors, reported finding her lying in feces, urine and possible bedbugs.    Work up in ER consistent with hyponatremia, AKI, hyperglycemia and possible UTI.  Imaging of chest showed multiple cavitary lesions.  Treated with IV abx, fluids and admitted for further evaluation.        Assessment & Plan:  Cavitary Lesions in Left Lung  Multiple large cavitary lesions on non-con CT chest.  MRSA PCR positive.   Differential includes bacterial infection in setting of possible recurrent aspiration / microaspiration with poor dentition (note D1 diet), malignancy, fungal infection, endocarditis and less likely TB.  -Follow quantiferon gold, sputum culture -Obtain AFB  -Continue meropenem, vancomycin while inpatient  -narrow abx based on cultures > if negative consider augmentin PO for discharge  -Will need follow up CT imaging in 4-6 weeks -ECHO not showing any vegetations -appreciate Pulmonary consultation > rec's for ANA, ANCA, RF, Anti-GBM and galactomannan & prolonged abx course. No role for FOB at this time. -Appreciate SLP evaluation, D1 diet  -will need dental evaluation post discharge  - will get ID c/s  Pyuria/bacteriuria Covered  Hypotension  Resolved with fluids.   -monitor Na closely   Hyponatremia  Volume Depletion Admit Na 122, corrected to 132.  Osmolalities suggest degree SIADH. Likely related to poor intake as well.  TSH normal, cortisol normal.  Asymptomatic  AKI Hypomagnesemia  Hypokalemia Baseline Sr Cr ~ 1, admit 1.85, CK 138.  Resolved with fluids.   Hyperglycemia DM II  Insulin dependent at baseline.  Glucose range 100-170.   Glucoses well controlled. -Continue SSI, sensitive scale  -Continue Lantus 10 units QHS   Microcytic Anemia  Baseline Hgb 10-12, down to 7.2.  Stable today, no clinical bleeding -Follow FOBT  -ferritin 257, TIBC 122 -Transfuse for Hgb <7%  Possible Left Breast Mass -> false +, likely artifact per radiology Not palpated on exam. Suspect nipple shadow on CXR, not seen on CT - [place] left breast cancelled - per radiology it's likely normal breast tissue  Stage II Decubitus Ulcers bilateral ischia Bilateral buttocks, present on admit  -WOC appreciated  -pressure relief measures, frequent turning  -PT recommends SNF   Moderate to Severe Malnutrition  As evidenced by diffuse loss of muscle mass, fat and 10 pound weight loss. -D1 diet  -glucerna TID  Dispo -SW/CM consult to assist with possible placement needs         MDM and disposition: The below labs and imaging reports reviewed and summarized above.  Medication management as above.   The patient was admitted with encephalopathy from sepsis from cavitary lung infection.  At baseline she is independent for all self-cares, but currently is requiring assistance to get out of bed, and ambulate.  She will likely require significant rehab before she can return to her prior level of function.      DVT prophylaxis: heparin  Code Status: Full Code  Family Communication: Patient updated on plan of care.     Consultants:   Pulmonary   Procedures:   CT Chest w/o 11/20 >> multiple cavitary lesions, nodules, larger cavitary lesion with air fluid level  US Breast 11/20 >>  canceled by radiology as this was felt to be normal breast tissue  ECHO 11/22 >> no vegetations  Antimicrobials:   Levaquin 08/31/23 >> 11/21   Vancomycin 11/21 >>   Meropenem 11/21 >>    Subjective: Patient states she is feels better, appetite good.  Afebrile.  No respiratory distress.    Unable to get quantiferon gold draw until 11/23.  No acute  events overnight.      Objective: Vitals:   08/23/19 1030 08/23/19 1654 08/23/19 2145 08/24/19 1509  BP: 110/74 109/73 103/68 98/68  Pulse: 92 97 93 90  Resp: 16 18 18 17   Temp: 98.2 F (36.8 C) 97.9 F (36.6 C) 97.8 F (36.6 C) 97.8 F (36.6 C)  TempSrc: Oral Oral  Oral  SpO2: 100% 100% 100% 100%  Weight:      Height:        Intake/Output Summary (Last 24 hours) at 08/24/2019 1523 Last data filed at 08/24/2019 1511 Gross per 24 hour  Intake 2275.61 ml  Output 1300 ml  Net 975.61 ml   Filed Weights   08-31-2019 1317 08/22/19 0300  Weight: 41.7 kg 52.7 kg      Examination: General appearance: Chronically ill-appearing adult female, alert and in no obvious distress.  Sitting in bed, eating breakfast. HEENT: Anicteric, conjunctiva pink, lids and lashes normal. No nasal deformity, discharge, epistaxis.  Lips moist, dentition in very poor repair, oropharynx moist, no oral lesions, hearing normal   Skin: Warm and dry.  No suspicious rashes or lesions. Cardiac: RRR, no murmurs appreciated.  No LE edema.    Respiratory: Normal respiratory rate and rhythm.  Diminished bilaterally, CTAB without rales or wheezes. Abdomen: Abdomen soft.  No tenderness palpation or guarding. No ascites, distension, hepatosplenomegaly.   MSK: No deformities or effusions of the large joints of the upper or lower extremities bilaterally.  Diffuse loss of some of subcutaneous muscle mass and fat. Neuro: Awake and alert. Naming is grossly intact, and the patient's recall, recent and remote, as well as general fund of knowledge seem within normal limits.  Muscle tone diminished, without fasciculations.  Moves all extremities generalized weakness but with ormal coordination.  Marland Kitchen Speech fluent.    Psych: Sensorium intact and responding to questions, attention normal. Affect normal.  Judgment and insight appear normal.           Data Reviewed: I have personally reviewed following labs and imaging studies:   CBC: Recent Labs  Lab 31-Aug-2019 1327 31-Aug-2019 2313 08/22/19 0813 08/23/19 0421 08/24/19 0413  WBC 20.0* 17.0* 17.4* 14.2* 14.1*  NEUTROABS  --  14.3*  --   --   --   HGB 9.7* 7.4* 7.4* 7.8* 7.2*  HCT 27.6* 21.6* 22.0* 22.7* 21.5*  MCV 72.6* 73.7* 76.4* 74.4* 74.7*  PLT 436* 335 366 379 [id number]   Basic Metabolic Panel: Recent Labs  Lab 08/31/19 1327 08-31-19 2313 08/22/19 0813 08/23/19 0421 08/24/19 0413  NA 122* 131* 129* 133* 132*  K 4.9 3.7 3.3* 4.3 4.1  CL 86* 104 103 108 108  CO2 21* 18* 17* 18* 19*  GLUCOSE 448* 112* 143* 130* 209*  BUN 64* 50* 43* 31* 27*  CREATININE 1.85* 1.27* 1.10* 0.96 0.79  CALCIUM 9.1 7.7* 7.3* 8.1* 7.9*  MG  --  1.6*  --   --  2.0   GFR: Estimated Creatinine Clearance: 56.2 mL/min (by C-G formula based on SCr of 0.79 mg/dL). Liver Function Tests: Recent Labs  Lab 08-31-19 1327  AST 10*  ALT 13  ALKPHOS 171*  BILITOT 0.5  PROT 7.6  ALBUMIN 2.7*   Recent Labs  Lab 08/21/19 1327  LIPASE 20   No results for input(s): AMMONIA in the last 168 hours. Coagulation Profile: No results for input(s): INR, PROTIME in the last 168 hours. Cardiac Enzymes: Recent Labs  Lab 08/21/19 1542  CKTOTAL 138   BNP (last 3 results) No results for input(s): PROBNP in the last 8760 hours. HbA1C: No results for input(s): HGBA1C in the last 72 hours. CBG: Recent Labs  Lab 08/23/19 1135 08/23/19 1651 08/23/19 2143 08/24/19 0832 08/24/19 1236  GLUCAP 231* 228* 255* 133* 178*   Lipid Profile: No results for input(s): CHOL, HDL, LDLCALC, TRIG, CHOLHDL, LDLDIRECT in the last 72 hours. Thyroid Function Tests: Recent Labs    08/22/19 0813  TSH 1.122   Anemia Panel: Recent Labs    08/23/19 1000 08/24/19 0413  VITAMINB12 623  --   FOLATE 4.3*  --   FERRITIN  --  257  TIBC  --  122*  IRON  --  15*  RETICCTPCT  --  1.6   Urine analysis:    Component Value Date/Time   COLORURINE YELLOW (A) 08/21/2019 1327   APPEARANCEUR TURBID (A)  08/21/2019 1327   LABSPEC 1.015 08/21/2019 1327   PHURINE 5.0 08/21/2019 1327   GLUCOSEU >=500 (A) 08/21/2019 1327   HGBUR LARGE (A) 08/21/2019 1327   BILIRUBINUR NEGATIVE 08/21/2019 1327   BILIRUBINUR neg 05/15/2013 1038   KETONESUR NEGATIVE 08/21/2019 1327   PROTEINUR 30 (A) 08/21/2019 1327   UROBILINOGEN 0.2 05/15/2013 1038   NITRITE NEGATIVE 08/21/2019 1327   LEUKOCYTESUR LARGE (A) 08/21/2019 1327   Sepsis Labs: @LABRCNTIP (procalcitonin:4,lacticacidven:4)  ) Recent Results (from the past 240 hour(s))  Urine culture     Status: Abnormal   Collection Time: 08/21/19  1:27 PM   Specimen: Urine, Random  Result Value Ref Range Status   Specimen Description   Final    URINE, RANDOM Performed at [facility], [address] [place], [place] [address]    Special Requests   Final    NONE Performed at [facility], [address] [place], [place] [address]    Culture (A)  Final    >=100,000 COLONIES/mL LACTOBACILLUS SPECIES Standardized susceptibility testing for this organism is not available. Performed at [facility], [place] [address] [place], [place] [address]    Report Status 08/23/2019 FINAL  Final  SARS CORONAVIRUS 2 (TAT 6-24 HRS) Nasopharyngeal Nasopharyngeal Swab     Status: None   Collection Time: 08/21/19  3:42 PM   Specimen: Nasopharyngeal Swab  Result Value Ref Range Status   SARS Coronavirus 2 NEGATIVE NEGATIVE Final    Comment: (NOTE) SARS-CoV-2 target nucleic acids are NOT DETECTED. The SARS-CoV-2 RNA is generally detectable in upper and lower respiratory specimens during the acute phase of infection. Negative results do not preclude SARS-CoV-2 infection, do not rule out co-infections with other pathogens, and should not be used as the sole basis for treatment or other patient management decisions. Negative results must be combined with clinical observations, patient history, and epidemiological information. The expected result is  Negative. Fact Sheet for Patients: SugarRoll.be Fact Sheet for Healthcare Providers: https://www.woods-mathews.com/ This test is not yet approved or cleared by the [place] FDA and  has been authorized for detection and/or diagnosis of SARS-CoV-2 by FDA under an Emergency Use Authorization (EUA). This EUA will remain  in effect (meaning this test can be used)  for the duration of the COVID-19 declaration under Section 56 4(b)(1) of the Act, 21 U.S.C. section 360bbb-3(b)(1), unless the authorization is terminated or revoked sooner. Performed at [facility], [place] [address] [place], [place] [address]   Blood culture (routine x 2)     Status: None (Preliminary result)   Collection Time: 08/21/19  3:42 PM   Specimen: BLOOD  Result Value Ref Range Status   Specimen Description BLOOD RIGHT ANTECUBITAL  Final   Special Requests   Final    BOTTLES DRAWN AEROBIC AND ANAEROBIC Blood Culture adequate volume   Culture   Final    NO GROWTH 3 DAYS Performed at [facility], [address], [place], [place] [address]    Report Status PENDING  Incomplete  Blood culture (routine x 2)     Status: None (Preliminary result)   Collection Time: 08/21/19  3:42 PM   Specimen: BLOOD  Result Value Ref Range Status   Specimen Description BLOOD BLOOD RIGHT FOREARM  Final   Special Requests   Final    BOTTLES DRAWN AEROBIC AND ANAEROBIC Blood Culture adequate volume   Culture   Final    NO GROWTH 3 DAYS Performed at [facility], [address] [place], [place] [address]    Report Status PENDING  Incomplete  MRSA PCR Screening     Status: Abnormal   Collection Time: 08/22/19 11:41 AM  Result Value Ref Range Status   MRSA by PCR POSITIVE (A) NEGATIVE Final    Comment:        The GeneXpert MRSA Assay (FDA approved for NASAL specimens only), is one component of a comprehensive MRSA colonization surveillance program. It is not  intended to diagnose MRSA infection nor to guide or monitor treatment for MRSA infections. RESULT CALLED TO, READ BACK BY AND VERIFIED WITH: JANE HODGE ON 08/22/2019 AT 1345 TIK Performed at [facility], [address] [place], [place] [address]   Expectorated sputum assessment w rflx to resp cult     Status: None   Collection Time: 08/23/19 10:00 AM   Specimen: Sputum  Result Value Ref Range Status   Specimen Description SPUTUM  Final   Special Requests NONE  Final   Sputum evaluation   Final    Sputum specimen not acceptable for testing.  Please recollect.   C/CHELSEA KNIGHT AT [id number] 08/23/2019.PMF Performed at [facility], [address], [place], [place] [address]    Report Status 08/23/2019 FINAL  Final         Radiology Studies: No results found.      Scheduled Meds: . Chlorhexidine Gluconate Cloth  6 each Topical Q0600  . feeding supplement (GLUCERNA SHAKE)  237 mL Oral TID BM  . heparin  5,000 Units Subcutaneous Q8H  . insulin aspart  0-9 Units Subcutaneous TID WC  . insulin glargine  10 Units Subcutaneous QHS  . mupirocin ointment  1 application Nasal BID  . pantoprazole  40 mg Oral Daily  . sodium chloride HYPERTONIC  4 mL Nebulization Daily   Continuous Infusions: . sodium chloride Stopped (08/24/19 1440)  . lactated ringers Stopped (08/24/19 1504)  . meropenem (MERREM) IV Stopped (08/24/19 1413)  . vancomycin 200 mL/hr at 08/24/19 1511     LOS: 3 days    Time spent: 30 minutes    Noe Gens, AG-ACNP-[facility] 08/24/2019, 3:23 PM     Please page though Isabel or Epic secure chat:  For [employer], contact charge nurse         Attending MD Note:  I have seen and examined the patient with NP student and agree with the note above which has been edited to reflect our agreed upon history, exam, and assessment/plan.    I have personally reviewed the orders for the  patient, which were made under my direction.       Aryanah Enslow Manuella Ghazi

## 2019-08-24 NOTE — Consult Note (Addendum)
NAME: Julia Shea  DOB: Dec 22, 1955  MRN: [id number]  Date/Time: 08/24/2019 10:59 AM  REQUESTING PROVIDER :Manuella Ghazi Subjective:  REASON FOR CONSULT: TB vs pneumonia ?pt is a poor historian, and not very cooperative Julia Shea is a 63 y.o. with a history of DM, hypertension, history of MRSA infection, CKD stage III presented to the hospital because of nausea vomiting and generalized weakness and left hip pain. As per EMS note they were called because of a fall.  When they went inside the living condition was bad with no room for movement and there was bugs in pieces everywhere.  The patient was lying on the couch in her own feces and urine.  There was bugs crawling everywhere roaches and bedbugs etc.  The vitals measured by EMS was a blood sugar of 496, heart rate of 82 blood pressure of 88/60.  Patient was awake alert and oriented x4   Vitals in the ED showed temperature of 98.6 blood pressure of 98/65 sats of 100% pulse rate of 84 and respiratory rate of 27. Work-up in the ED showed sodium of 122, BUN of 64 and creatinine of 1.85, glucose of 448 WBC of 20,000.  she was given IV Levaquin and IV fluids. I am seeing the patient for the cavitating  lung lesions. Pt says she was in the hospital 5 yrs ago for 30 days and was told about MRSA pneumonia She says she has a hole in her bladder  Past Medical History:  Diagnosis Date  . Asthma   . Bilateral hip pain   . 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 Chronic indwelling Foley catheter   . Closed comminuted left humeral fracture    ORIF in June 2017  . Depression   . Diabetes mellitus type 2, with complication, on long term insulin pump ([place])   . Falls frequently   . Hypertension   . MRSA infection   . Nausea & vomiting   . Rotator cuff tear arthropathy, left     Past Surgical History:  Procedure Laterality Date  . LAPAROSCOPIC OOPHORECTOMY     removal of cyst     Social History   Socioeconomic History  . Marital status: Single    Spouse name: Not on file  . Number of children: Not on file  . Years of education: Not on file  . Highest education level: Not on file  Occupational History  . Occupation: retired  Scientific laboratory technician  . Financial resource strain: Not on file  . Food insecurity    Worry: Not on file    Inability: Not on file  . Transportation needs    Medical: Not on file    Non-medical: Not on file  Tobacco Use  . Smoking status: Never Smoker  . Smokeless tobacco: Never Used  Substance and Sexual Activity  . Alcohol use: No  . Drug use: No  . Sexual activity: Not on file  Lifestyle  . Physical activity    Days per week: Not on file    Minutes per session: Not on file  . Stress: Not on file  Relationships  . Social Herbalist on phone: Not on file    Gets together: Not on file    Attends religious service: Not on file    Active member of club or organization: Not on file    Attends meetings of clubs or organizations: Not on file  Relationship status: Not on file  . Intimate partner violence    Fear of current or ex partner: Not on file    Emotionally abused: Not on file    Physically abused: Not on file    Forced sexual activity: Not on file  Other Topics Concern  . Not on file  Social History Narrative  . Not on file    Family History  Problem Relation Age of Onset  . Diabetes Maternal Uncle    Allergies  Allergen Reactions  . Penicillins     Has patient had a PCN reaction causing immediate rash, facial/tongue/throat swelling, SOB or lightheadedness with hypotension: Yes Has patient had a PCN reaction causing severe rash involving mucus membranes or skin necrosis: Yes Has patient had a PCN reaction that required hospitalization: Yes Has patient had a PCN reaction occurring within the last 10 years: Yes If all of the above answers are "NO", then may proceed with Cephalosporin use.  . Sulfa Antibiotics     ? Current Facility-Administered Medications  Medication Dose Route Frequency Provider Last Rate Last Dose  . 0.9 %  sodium chloride infusion   Intravenous PRN Edwin Dada, MD 10 mL/hr at 08/24/19 1011    . acetaminophen (TYLENOL) tablet 650 mg  650 mg Oral Q6H PRN Jennye Boroughs, MD       Or  . acetaminophen (TYLENOL) suppository 650 mg  650 mg Rectal Q6H PRN Jennye Boroughs, MD      . feeding supplement (GLUCERNA SHAKE) (GLUCERNA SHAKE) liquid 237 mL  237 mL Oral TID BM Edwin Dada, MD   237 mL at 08/24/19 0903  . heparin injection 5,000 Units  5,000 Units Subcutaneous Q8H Jennye Boroughs, MD   5,000 Units at 08/24/19 0520  . insulin aspart (novoLOG) injection 0-9 Units  0-9 Units Subcutaneous TID WC Jennye Boroughs, MD   1 Units at 08/24/19 [phone]  . insulin glargine (LANTUS) injection 10 Units  10 Units Subcutaneous QHS Jennye Boroughs, MD   10 Units at 08/23/19 2346  . lactated ringers infusion   Intravenous Continuous Edwin Dada, MD 75 mL/hr at 08/24/19 1011    . meropenem (MERREM) 1 g in sodium chloride 0.9 % 100 mL IVPB  1 g Intravenous Q8H Lu Duffel, [facility]   Stopped at 08/24/19 3532  . pantoprazole (PROTONIX) EC tablet 40 mg  40 mg Oral Daily Jennye Boroughs, MD   40 mg at 08/24/19 0858  . polyethylene glycol (MIRALAX / GLYCOLAX) packet 17 g  17 g Oral Daily PRN Jennye Boroughs, MD   17 g at 08/24/19 0900  . sodium chloride HYPERTONIC 3 % nebulizer solution 4 mL  4 mL Nebulization Daily Danford, Suann Larry, MD   4 mL at 08/24/19 0752  . vancomycin (VANCOCIN) IVPB 1000 mg/200 mL premix  1,000 mg Intravenous Q24H Shanlever, Pierce Crane, RPH         Abtx:  Anti-infectives (From admission, onward)   Start     Dose/Rate Route Frequency Ordered Stop   08/24/19 1400  vancomycin (VANCOCIN) IVPB 1000 mg/200 mL premix     1,000 mg 200 mL/hr over 60 Minutes Intravenous Every 24 hours 08/24/19 0813     08/24/19 0815  meropenem (MERREM) 1 g in sodium  chloride 0.9 % 100 mL IVPB     1 g 200 mL/hr over 30 Minutes Intravenous Every 8 hours 08/24/19 0811     08/23/19 1700  vancomycin (VANCOCIN) IVPB 750 mg/150 ml premix  Status:  Discontinued     750 mg 150 mL/hr over 60 Minutes Intravenous Every 24 hours 08/22/19 1516 08/24/19 0813   08/22/19 1700  vancomycin (VANCOCIN) 1,250 mg in sodium chloride 0.9 % 250 mL IVPB     1,250 mg 166.7 mL/hr over 90 Minutes Intravenous  Once 08/22/19 1516 08/22/19 1930   08/22/19 1500  Levofloxacin (LEVAQUIN) IVPB 250 mg  Status:  Discontinued     250 mg 50 mL/hr over 60 Minutes Intravenous Every 24 hours 08/21/19 1939 08/22/19 0756   08/22/19 0815  meropenem (MERREM) 1 g in sodium chloride 0.9 % 100 mL IVPB  Status:  Discontinued     1 g 200 mL/hr over 30 Minutes Intravenous 2 times daily 08/22/19 0806 08/24/19 0811   08/22/19 0800  meropenem (MERREM) 1 g in sodium chloride 0.9 % 100 mL IVPB  Status:  Discontinued     1 g 200 mL/hr over 30 Minutes Intravenous  Once 08/22/19 0756 08/22/19 0806   08/21/19 1415  levofloxacin (LEVAQUIN) IVPB 500 mg     500 mg 100 mL/hr over 60 Minutes Intravenous  Once 08/21/19 1412 08/21/19 1612      REVIEW OF SYSTEMS:  Says she does not want tot be bothered!,  Says cough is her main problem present for 2 weeks Generalized weakness weight loss Skin: excoriations Heme: negative for easy bruising and gum/nose bleeding  Neurolo: headaches, dizziness, no vertigo, memory problems  Psych: depression  Endocrine: negative for thyroid, diabetes Allergy/Immunology- says she had allergy to PCN- rash many years ago? Pertinent Positives include : Objective:  VITALS:  BP 103/68 (BP Location: Right Arm)   Pulse 93   Temp 97.8 F (36.6 C)   Resp 18   Ht _0  (1.575 m)   Wt 52.7 kg   SpO2 100%   BMI 21.23 kg/m  PHYSICAL EXAM:  General: awake, not cooperative, no distress, pale  Head: Normocephalic, without obvious abnormality, atraumatic. Eyes: Conjunctivae clear,  anicteric sclerae. Pupils are equal ENT Nares normal. No drainage or sinus tenderness. Lips, poor denitition- few broken teeth left Neck: Supple, symmetrical, no adenopathy, thyroid: non tender no carotid bruit and no JVD. Back: not examined Lungs: b/l air entry Heart:s1s2 Abdomen: Soft, non-tender,not distended. Bowel sounds normal. No masses Extremities: b/l evidence of venous stasis and pigmentation and some excoriation lower extremities Skin: No rashes or lesions. Or bruising Lymph: Cervical, supraclavicular normal. Neurologic: Grossly non-focal Pertinent Labs Lab Results CBC    Component Value Date/Time   WBC 14.1 (H) 08/24/2019 0413   RBC 2.88 (L) 08/24/2019 0413   RBC 2.88 (L) 08/24/2019 0413   HGB 7.2 (L) 08/24/2019 0413   HGB 10.7 (L) 08/13/2018 1211   HCT 21.5 (L) 08/24/2019 0413   HCT 32.2 (L) 08/13/2018 1211   PLT 357 08/24/2019 0413   PLT 365 08/13/2018 1211   MCV 74.7 (L) 08/24/2019 0413   MCV 86 08/13/2018 1211   MCH 25.0 (L) 08/24/2019 0413   MCHC 33.5 08/24/2019 0413   RDW 14.2 08/24/2019 0413   RDW 14.4 08/13/2018 1211   LYMPHSABS 1.6 08/21/2019 2313   LYMPHSABS 4.4 (H) 02/06/2018 1205   MONOABS 0.8 08/21/2019 2313   EOSABS 0.0 08/21/2019 2313   EOSABS 0.2 02/06/2018 1205   BASOSABS 0.0 08/21/2019 2313   BASOSABS 0.0 02/06/2018 1205    CMP Latest Ref Rng & Units 08/24/2019 08/23/2019 08/22/2019  Glucose 70 - 99 mg/dL 209(H) 130(H) 143(H)  BUN 8 - 23 mg/dL 27(H) 31(H) 43(H)  Creatinine  0.44 - 1.00 mg/dL 0.79 0.96 1.10(H)  Sodium 135 - 145 mmol/L 132(L) 133(L) 129(L)  Potassium 3.5 - 5.1 mmol/L 4.1 4.3 3.3(L)  Chloride 98 - 111 mmol/L 108 108 103  CO2 22 - 32 mmol/L 19(L) 18(L) 17(L)  Calcium 8.9 - 10.3 mg/dL 7.9(L) 8.1(L) 7.3(L)  Total Protein 6.5 - 8.1 g/dL - - -  Total Bilirubin 0.3 - 1.2 mg/dL - - -  Alkaline Phos 38 - 126 U/L - - -  AST 15 - 41 U/L - - -  ALT 0 - 44 U/L - - -      Microbiology: Recent Results (from the past 240  hour(s))  Urine culture     Status: Abnormal   Collection Time: 08/21/19  1:27 PM   Specimen: Urine, Random  Result Value Ref Range Status   Specimen Description   Final    URINE, RANDOM Performed at [facility], [address], [place], [place] [address]    Special Requests   Final    NONE Performed at [facility], [address] [place], [place] [address]    Culture (A)  Final    >=100,000 COLONIES/mL LACTOBACILLUS SPECIES Standardized susceptibility testing for this organism is not available. Performed at [facility], [place] [address] [place], [place] [address]    Report Status 08/23/2019 FINAL  Final  SARS CORONAVIRUS 2 (TAT 6-24 HRS) Nasopharyngeal Nasopharyngeal Swab     Status: None   Collection Time: 08/21/19  3:42 PM   Specimen: Nasopharyngeal Swab  Result Value Ref Range Status   SARS Coronavirus 2 NEGATIVE NEGATIVE Final    Comment: (NOTE) SARS-CoV-2 target nucleic acids are NOT DETECTED. The SARS-CoV-2 RNA is generally detectable in upper and lower respiratory specimens during the acute phase of infection. Negative results do not preclude SARS-CoV-2 infection, do not rule out co-infections with other pathogens, and should not be used as the sole basis for treatment or other patient management decisions. Negative results must be combined with clinical observations, patient history, and epidemiological information. The expected result is Negative. Fact Sheet for Patients: SugarRoll.be Fact Sheet for Healthcare Providers: https://www.woods-mathews.com/ This test is not yet approved or cleared by the [place] FDA and  has been authorized for detection and/or diagnosis of SARS-CoV-2 by FDA under an Emergency Use Authorization (EUA). This EUA will remain  in effect (meaning this test can be used) for the duration of the COVID-19 declaration under Section 56 4(b)(1) of the Act, 21 U.S.C. section  360bbb-3(b)(1), unless the authorization is terminated or revoked sooner. Performed at [facility], [place] [address] [place], [place] [address]   Blood culture (routine x 2)     Status: None (Preliminary result)   Collection Time: 08/21/19  3:42 PM   Specimen: BLOOD  Result Value Ref Range Status   Specimen Description BLOOD RIGHT ANTECUBITAL  Final   Special Requests   Final    BOTTLES DRAWN AEROBIC AND ANAEROBIC Blood Culture adequate volume   Culture   Final    NO GROWTH 3 DAYS Performed at [facility], [address] [place], [place] [address]    Report Status PENDING  Incomplete  Blood culture (routine x 2)     Status: None (Preliminary result)   Collection Time: 08/21/19  3:42 PM   Specimen: BLOOD  Result Value Ref Range Status   Specimen Description BLOOD BLOOD RIGHT FOREARM  Final   Special Requests   Final    BOTTLES  DRAWN AEROBIC AND ANAEROBIC Blood Culture adequate volume   Culture   Final    NO GROWTH 3 DAYS Performed at [facility], [address] [place], [place] [address]    Report Status PENDING  Incomplete  MRSA PCR Screening     Status: Abnormal   Collection Time: 08/22/19 11:41 AM  Result Value Ref Range Status   MRSA by PCR POSITIVE (A) NEGATIVE Final    Comment:        The GeneXpert MRSA Assay (FDA approved for NASAL specimens only), is one component of a comprehensive MRSA colonization surveillance program. It is not intended to diagnose MRSA infection nor to guide or monitor treatment for MRSA infections. RESULT CALLED TO, READ BACK BY AND VERIFIED WITH: JANE HODGE ON 08/22/2019 AT 1345 TIK Performed at [facility], [address] [place], [place] [address]   Expectorated sputum assessment w rflx to resp cult     Status: None   Collection Time: 08/23/19 10:00 AM   Specimen: Sputum  Result Value Ref Range Status   Specimen Description SPUTUM  Final   Special Requests NONE  Final   Sputum evaluation    Final    Sputum specimen not acceptable for testing.  Please recollect.   C/CHELSEA KNIGHT AT 3428 08/23/2019.PMF Performed at [facility], [address], [place], [place] [address]    Report Status 08/23/2019 FINAL  Final    IMAGING RESULTS:     I have personally reviewed the films ? Impression/Recommendation ? ?Hip pains leading to inability to walk   Multiple cavitary lesions in the lung. D.D septic emboli with cavitation , MRSA pneumonia,  atypical infection like MAC/TB less likely but will get sputum, non infectious causes like wegener's,  Will change meropenem to cefepime continue vanco, may switch to linezolid ( she has taken cefepime and ceftriaxone in her previous admissions in the ED)  MRSA nares  Hyponatremia ?  Leukocytosis  Anemia  AKI- resolved- likely prerenal  Question of ca breast on CT chest- no obvious lesion seen - palpation limited  Increased ESR of 126 ? MRSA infection in the past I only see coag neg staph in the blood in April 2019 and MRSA nares  ? ____ID will follow along   Note:  This document was prepared using Dragon voice recognition software and may include unintentional dictation errors.

## 2019-08-24 NOTE — Progress Notes (Signed)
SLP Cancellation Note  Patient Details Name: Julia Shea MRN: [id number] DOB: 1956/04/09   Cancelled treatment:       Reason Eval/Treat Not Completed: Other (comment)  The patient is on modified diet (puree) secondary edentulous status. Per nursing, patient's appetite is poor.  SLP will follow up tomorrow.   Leroy Sea, MS/CCC- SLP  Lou Miner 08/24/2019, 2:51 PM

## 2019-08-24 NOTE — Consult Note (Addendum)
Pharmacy Antibiotic Note  Julia Shea is a 63 y.o. female with medical history of hypertension, diabetes, frequent falls, asthma, admitted on 08/21/2019 with pneumonia. Imaging with cavitary lesions c/f atypical infections. Per chart, patient has reported weight loss, sweats, fevers, chills fatigue. Patient with history of MRSA infection (pneumonia?)- was not able to find personally in chart. UA with many bacteria, >50 RBC, >50 WBC. Pharmacy has been consulted for vancomycin and meropenem dosing. Pulmonology following.    Afebrile x 24h. WBC 14.1. Patient is not hypoxic per chart and is on room air. Creatinine has continued to improve since admission.   Plan: Meropenem 1 g q8h (CrCl now > 50 ml/min - increased dosing to q8h)  Will increase to Vancomycin 1000 mg IV Q 24 hrs.  Goal AUC 400-550. Expected AUC: 516.4, trough 11.5 SCr used: 0.8  Continue to follow daily serum creatinine while on vancomycin and adjust dose as indicated. Levels at steady state.   Height: 5\' 2"  (157.5 cm) Weight: 116 lb 1.6 oz (52.7 kg) IBW/kg (Calculated) : 50.1  Temp (24hrs), Avg:98 F (36.7 C), Min:97.8 F (36.6 C), Max:98.2 F (36.8 C)  Recent Labs  Lab 08/21/19 1327 08/21/19 2313 08/22/19 0813 08/23/19 0421 08/24/19 0413  WBC 20.0* 17.0* 17.4* 14.2* 14.1*  CREATININE 1.85* 1.27* 1.10* 0.96 0.79    Estimated Creatinine Clearance: 56.2 mL/min (by C-G formula based on SCr of 0.79 mg/dL).    Allergies  Allergen Reactions  . Penicillins     Has patient had a PCN reaction causing immediate rash, facial/tongue/throat swelling, SOB or lightheadedness with hypotension: Yes Has patient had a PCN reaction causing severe rash involving mucus membranes or skin necrosis: Yes Has patient had a PCN reaction that required hospitalization: Yes Has patient had a PCN reaction occurring within the last 10 years: Yes If all of the above answers are "NO", then may proceed with Cephalosporin use.  . Sulfa  Antibiotics     Antimicrobials this admission: Levofloxacin 11/20 x 1 Meropenem 11/21 >>  Vancomycin 11/21 >>   Dose adjustments this admission: 11/23 - Adjusted Meropenem from q12 to q8h 11/23 - Adjusted Vancomycin from 750mg  q24 to 1000mg  q24  Microbiology results: 11/20 COVID-19: negative 11/20 BCx: no growth x 2 days 11/20 UCx: Lactobacillus  11/21 Sputum: untestable 11/21 MRSA PCR: positive 11/21 AFB culture/smear: pending 11/21 Quantiferon-TB: pending  Thank you for allowing pharmacy to be a part of this patient's care.  Lu Duffel, PharmD, BCPS Clinical Pharmacist 08/24/2019 8:19 AM

## 2019-08-25 DIAGNOSIS — E871 Hypo-osmolality and hyponatremia: Principal | ICD-10-CM

## 2019-08-25 DIAGNOSIS — R05 Cough: Secondary | ICD-10-CM

## 2019-08-25 DIAGNOSIS — I878 Other specified disorders of veins: Secondary | ICD-10-CM

## 2019-08-25 LAB — CBC
HCT: 21.8 % — ABNORMAL LOW (ref 36.0–46.0)
Hemoglobin: 7.1 g/dL — ABNORMAL LOW (ref 12.0–15.0)
MCH: 25.2 pg — ABNORMAL LOW (ref 26.0–34.0)
MCHC: 32.6 g/dL (ref 30.0–36.0)
MCV: 77.3 fL — ABNORMAL LOW (ref 80.0–100.0)
Platelets: 364 10*3/uL (ref 150–400)
RBC: 2.82 MIL/uL — ABNORMAL LOW (ref 3.87–5.11)
RDW: 14.3 % (ref 11.5–15.5)
WBC: 11.7 10*3/uL — ABNORMAL HIGH (ref 4.0–10.5)
nRBC: 0 % (ref 0.0–0.2)

## 2019-08-25 LAB — BASIC METABOLIC PANEL
Anion gap: 8 (ref 5–15)
BUN: 22 mg/dL (ref 8–23)
CO2: 20 mmol/L — ABNORMAL LOW (ref 22–32)
Calcium: 7.8 mg/dL — ABNORMAL LOW (ref 8.9–10.3)
Chloride: 106 mmol/L (ref 98–111)
Creatinine, Ser: 0.83 mg/dL (ref 0.44–1.00)
GFR calc Af Amer: >60 mL/min (ref >60)
GFR calc non Af Amer: >60 mL/min (ref >60)
Glucose, Bld: 106 mg/dL — ABNORMAL HIGH (ref 70–99)
Potassium: 3.9 mmol/L (ref 3.5–5.1)
Sodium: 134 mmol/L — ABNORMAL LOW (ref 135–145)

## 2019-08-25 LAB — GLUCOSE, CAPILLARY
Glucose-Capillary: 92 mg/dL (ref 70–99)
Glucose-Capillary: 158 mg/dL — ABNORMAL HIGH (ref 70–99)
Glucose-Capillary: 240 mg/dL — ABNORMAL HIGH (ref 70–99)
Glucose-Capillary: 166 mg/dL — ABNORMAL HIGH (ref 70–99)

## 2019-08-25 LAB — PROCALCITONIN: Procalcitonin: 0.21 ng/mL

## 2019-08-25 MED ORDER — FERROUS SULFATE 325 (65 FE) MG PO TABS
325.0000 mg | ORAL_TABLET | Freq: Every day | ORAL | Status: DC
  Administered 2019-08-25 – 2019-09-09 (×13): 325 mg via ORAL
  Filled 2019-08-25 (×13): qty 1

## 2019-08-25 MED ORDER — ENOXAPARIN SODIUM 40 MG/0.4ML ~~LOC~~ SOLN
40.0000 mg | SUBCUTANEOUS | Status: DC
  Administered 2019-08-25 – 2019-08-29 (×5): 40 mg via SUBCUTANEOUS
  Filled 2019-08-25 (×5): qty 0.4

## 2019-08-25 MED ORDER — SODIUM CHLORIDE 0.9 % IV SOLN
2.0000 g | Freq: Two times a day (BID) | INTRAVENOUS | Status: DC
  Administered 2019-08-25 – 2019-08-26 (×3): 2 g via INTRAVENOUS
  Filled 2019-08-25 (×5): qty 2

## 2019-08-25 MED ORDER — FOLIC ACID 1 MG PO TABS
1.0000 mg | ORAL_TABLET | Freq: Every day | ORAL | Status: DC
  Administered 2019-08-25 – 2019-09-09 (×13): 1 mg via ORAL
  Filled 2019-08-25 (×13): qty 1

## 2019-08-25 MED ORDER — SODIUM CHLORIDE 0.9 % IV SOLN: 2.0000 g | Freq: Three times a day (TID) | INTRAVENOUS | Status: DC

## 2019-08-25 MED ORDER — SODIUM CHLORIDE 0.9 % IV SOLN
2.0000 g | INTRAVENOUS | Status: DC
  Filled 2019-08-25: qty 20

## 2019-08-25 MED ORDER — SODIUM CHLORIDE 0.9 % IV SOLN: 2.0000 g | INTRAVENOUS | Status: DC

## 2019-08-25 NOTE — Progress Notes (Signed)
PROGRESS NOTE    Julia Shea  [id number] DOB: Feb 18, 1956 DOA: 08/21/2019 PCP: Patient, No Pcp Per      Brief Narrative:  Julia Shea is a 63 y.o. F with HTN, DM II, Depression, frequent falls, and asthma who was admitted 11/20 with N/V, generalized weakness and left hip pain. EMS were called for a welfare check by neighbors, reported finding her lying in feces, urine and possible bedbugs.    Work up in ER consistent with hyponatremia, AKI, hyperglycemia and possible UTI.  Imaging of chest showed multiple cavitary lesions.  Treated with IV abx, fluids and admitted for further evaluation.         Assessment & Plan:  Cavitary Lesions in Left Lung  Multiple large cavitary lesions on non-con CT chest.  MRSA PCR positive.   Differential includes bacterial lung abscess (denies alcohol history but note poor dentition, D1 diet), MRSA pneumonia, malignancy, fungal infection, TB.  Septic emboli seems less likely given normal echocardiogram, negative blood cultures.  Patient unable to produce sputum for sputum culture. -Follow-up quantiferon gold -Obtain AFB   -Consult ID (rec vanc vs linezolid, narrow to cefepime, will follow) -Consult Pulmonology (rec ANA, ANCA, RF, Anti-GBM and galactomannan & prolonged abx course, no plan for bronch) -Continue cefepime, vancomycin  -Will need follow up CT imaging in 4-6 weeks  -Appreciate SLP evaluation, D1 diet  -Will need dental evaluation post discharge    Pyuria/bacteriuria Suspect this is not infection  Hypotension  Resolved with fluids.    Hyponatremia  Volume Depletion Admit Na 122.  Osmolalities suggest degree SIADH.  Improved with nutrition and fluids.    AKI Hypomagnesemia  Hypokalemia Baseline Sr Cr ~ 1, admit 1.85, CK 138.  Resolved with fluids.   Hyperglycemia DM II  Insulin dependent at baseline.  Glucose well controlled here -Continue SS correction insulin -Continue Lantus  Microcytic Anemia  Baseline Hgb  10-12, Hemoglobin overall stable but trended down to 7.1.  Microcytic.  No clinical bleeding.  Iron studies difficult to interpret, suggest chronic disease, may be some component of iron deficiency. -Start iron supplement -Start folate  -Transfuse for hemoglobin less than 7  Left breast mass ruled out This was noted on initial x-ray, not on follow-up CT nor on exam nor on ultrasound.  Stage II Decubitus Ulcers bilateral ischia Bilateral buttocks, present on admit  -Consult W OC   Moderate to Severe Malnutrition  As evidenced by diffuse loss of muscle mass, fat and 10 pound weight loss. -SLP consult -Glucerna 3 times daily        MDM and disposition: The below labs and imaging reports reviewed and summarized above.  Medication management as above.  The patient was admitted with encephalopathy from sepsis from cavitary lung infection.  At baseline she is independent for all self-cares, but currently is requiring assistance to get out of bed, and ambulate.  She will likely require significant rehab before she can return to her prior level of function.      DVT prophylaxis: Lovenox Code Status: Full Code  Family Communication:     Consultants:   Pulmonary   Procedures:   CT Chest w/o 11/20 >> multiple cavitary lesions, nodules, larger cavitary lesion with air fluid level  US Breast 11/20 >>    ECHO 11/22 >>  Antimicrobials:   Levaquin 11/20 >> 11/21   Vancomycin 11/21 >>   Meropenem 11/21 >> 11/23  Cefepime 11/24>>   Subjective: Patient says she "does not want to die.".  Appetite good.  No further fever.  No vomiting, confusion, chest pain.  No dyspnea, sputum, hemoptysis.     Objective: Vitals:   08/25/19 0345 08/25/19 0804 08/25/19 1332 08/25/19 1725  BP: 112/69  92/69 118/64  Pulse: 88 90 88 68  Resp: 18 18  14   Temp: 97.9 F (36.6 C)   98.4 F (36.9 C)  TempSrc: Oral   Axillary  SpO2: 98% 97% 98% 94%  Weight:      Height:         Intake/Output Summary (Last 24 hours) at 08/25/2019 1757 Last data filed at 08/25/2019 1203 Gross per 24 hour  Intake 537.3 ml  Output -  Net 537.3 ml   Filed Weights   08/21/19 1317 08/22/19 0300  Weight: 41.7 kg 52.7 kg      Examination: General appearance: Chronically ill-appearing adult female, alert and in no acute distress.  Lying in bed HEENT: Anicteric, conjunctiva pink, lids and lashes normal. No nasal deformity, discharge, epistaxis.  Lips moist, dentition in poor repair, oropharynx moist, no oral lesions, hearing normal.   Skin: Warm and dry.  Excoriations on bilateral lower extremities. Cardiac: RRR, no murmurs appreciated.  Trace LE edema.   JVP normal. Respiratory: Normal respiratory rate and rhythm.  CTAB without rales or wheezes. Abdomen: Abdomen soft.  No tenderness palpation or guarding.. No ascites, distension, hepatosplenomegaly.   MSK: No deformities or effusions of the large joints of the upper or lower extremities bilaterally.  Diffuse loss of subcutaneous muscle mass and fat. Neuro: Awake and alert. Naming is grossly intact, and the patient's recall, recent and remote, as well as general fund of knowledge seem within normal limits.  Muscle tone normal, without fasciculations.  Moves all extremities equally and with normal coordination.  Marland Kitchen Speech fluent.    Psych: Sensorium intact and responding to questions, attention normal. Affect odd.  Judgment and insight appear impaired.  AL          Data Reviewed: I have personally reviewed following labs and imaging studies:  CBC: Recent Labs  Lab 08/21/19 2313 08/22/19 0813 08/23/19 0421 08/24/19 0413 08/25/19 0408  WBC 17.0* 17.4* 14.2* 14.1* 11.7*  NEUTROABS 14.3*  --   --   --   --   HGB 7.4* 7.4* 7.8* 7.2* 7.1*  HCT 21.6* 22.0* 22.7* 21.5* 21.8*  MCV 73.7* 76.4* 74.4* 74.7* 77.3*  PLT 335 366 379 357 [id number]   Basic Metabolic Panel: Recent Labs  Lab 08/21/19 2313 08/22/19 0813 08/23/19 0421  08/24/19 0413 08/25/19 0408  NA 131* 129* 133* 132* 134*  K 3.7 3.3* 4.3 4.1 3.9  CL 104 103 108 108 106  CO2 18* 17* 18* 19* 20*  GLUCOSE 112* 143* 130* 209* 106*  BUN 50* 43* 31* 27* 22  CREATININE 1.27* 1.10* 0.96 0.79 0.83  CALCIUM 7.7* 7.3* 8.1* 7.9* 7.8*  MG 1.6*  --   --  2.0  --    GFR: Estimated Creatinine Clearance: 53.4 mL/min (by C-G formula based on SCr of 0.83 mg/dL). Liver Function Tests: Recent Labs  Lab 08/21/19 1327  AST 10*  ALT 13  ALKPHOS 171*  BILITOT 0.5  PROT 7.6  ALBUMIN 2.7*   Recent Labs  Lab 08/21/19 1327  LIPASE 20   No results for input(s): AMMONIA in the last 168 hours. Coagulation Profile: No results for input(s): INR, PROTIME in the last 168 hours. Cardiac Enzymes: Recent Labs  Lab 08/21/19 1542  CKTOTAL 138   BNP (last 3 results) No results  for input(s): PROBNP in the last 8760 hours. HbA1C: No results for input(s): HGBA1C in the last 72 hours. CBG: Recent Labs  Lab 08/24/19 1710 08/24/19 2110 08/25/19 0738 08/25/19 1214 08/25/19 1724  GLUCAP 156* 158* 92 158* 240*   Lipid Profile: No results for input(s): CHOL, HDL, LDLCALC, TRIG, CHOLHDL, LDLDIRECT in the last 72 hours. Thyroid Function Tests: No results for input(s): TSH, T4TOTAL, FREET4, T3FREE, THYROIDAB in the last 72 hours. Anemia Panel: Recent Labs    08/23/19 1000 08/24/19 0413  VITAMINB12 623  --   FOLATE 4.3*  --   FERRITIN  --  257  TIBC  --  122*  IRON  --  15*  RETICCTPCT  --  1.6   Urine analysis:    Component Value Date/Time   COLORURINE YELLOW (A) 08/21/2019 1327   APPEARANCEUR TURBID (A) 08/21/2019 1327   LABSPEC 1.015 08/21/2019 1327   PHURINE 5.0 08/21/2019 1327   GLUCOSEU >=500 (A) 08/21/2019 1327   HGBUR LARGE (A) 08/21/2019 1327   BILIRUBINUR NEGATIVE 08/21/2019 1327   BILIRUBINUR neg 05/15/2013 1038   KETONESUR NEGATIVE 08/21/2019 1327   PROTEINUR 30 (A) 08/21/2019 1327   UROBILINOGEN 0.2 05/15/2013 1038   NITRITE NEGATIVE  08/21/2019 1327   LEUKOCYTESUR LARGE (A) 08/21/2019 1327   Sepsis Labs: @LABRCNTIP (procalcitonin:4,lacticacidven:4)  ) Recent Results (from the past 240 hour(s))  Urine culture     Status: Abnormal   Collection Time: 08/21/19  1:27 PM   Specimen: Urine, Random  Result Value Ref Range Status   Specimen Description   Final    URINE, RANDOM Performed at [facility], [address], [place], [place] [address]    Special Requests   Final    NONE Performed at [facility], [address] [place], [place] [address]    Culture (A)  Final    >=100,000 COLONIES/mL LACTOBACILLUS SPECIES Standardized susceptibility testing for this organism is not available. Performed at [facility], [place] [address], [place], [place] [address]    Report Status 08/23/2019 FINAL  Final  SARS CORONAVIRUS 2 (TAT 6-24 HRS) Nasopharyngeal Nasopharyngeal Swab     Status: None   Collection Time: 08/21/19  3:42 PM   Specimen: Nasopharyngeal Swab  Result Value Ref Range Status   SARS Coronavirus 2 NEGATIVE NEGATIVE Final    Comment: (NOTE) SARS-CoV-2 target nucleic acids are NOT DETECTED. The SARS-CoV-2 RNA is generally detectable in upper and lower respiratory specimens during the acute phase of infection. Negative results do not preclude SARS-CoV-2 infection, do not rule out co-infections with other pathogens, and should not be used as the sole basis for treatment or other patient management decisions. Negative results must be combined with clinical observations, patient history, and epidemiological information. The expected result is Negative. Fact Sheet for Patients: SugarRoll.be Fact Sheet for Healthcare Providers: https://www.woods-mathews.com/ This test is not yet approved or cleared by the [place] FDA and  has been authorized for detection and/or diagnosis of SARS-CoV-2 by FDA under an Emergency Use Authorization (EUA). This  EUA will remain  in effect (meaning this test can be used) for the duration of the COVID-19 declaration under Section 56 4(b)(1) of the Act, 21 U.S.C. section 360bbb-3(b)(1), unless the authorization is terminated or revoked sooner. Performed at [facility], [place] [address], [place], [place] [address]   Blood culture (routine x 2)     Status: None (Preliminary result)   Collection Time: 08/21/19  3:42 PM   Specimen: BLOOD  Result Value Ref Range Status   Specimen Description BLOOD RIGHT ANTECUBITAL  Final   Special Requests   Final    BOTTLES DRAWN AEROBIC AND ANAEROBIC Blood Culture adequate volume   Culture   Final    NO GROWTH 3 DAYS Performed at [facility], [address] [place], [place] [address]    Report Status PENDING  Incomplete  Blood culture (routine x 2)     Status: None (Preliminary result)   Collection Time: 08/21/19  3:42 PM   Specimen: BLOOD  Result Value Ref Range Status   Specimen Description BLOOD BLOOD RIGHT FOREARM  Final   Special Requests   Final    BOTTLES DRAWN AEROBIC AND ANAEROBIC Blood Culture adequate volume   Culture   Final    NO GROWTH 3 DAYS Performed at [facility], [address], [place], [place] [address]    Report Status PENDING  Incomplete  MRSA PCR Screening     Status: Abnormal   Collection Time: 08/22/19 11:41 AM  Result Value Ref Range Status   MRSA by PCR POSITIVE (A) NEGATIVE Final    Comment:        The GeneXpert MRSA Assay (FDA approved for NASAL specimens only), is one component of a comprehensive MRSA colonization surveillance program. It is not intended to diagnose MRSA infection nor to guide or monitor treatment for MRSA infections. RESULT CALLED TO, READ BACK BY AND VERIFIED WITH: JANE HODGE ON 08/22/2019 AT 1345 TIK Performed at [facility], [address] [place], [place] [address]   Expectorated sputum assessment w rflx to resp cult     Status: None   Collection Time:  08/23/19 10:00 AM   Specimen: Sputum  Result Value Ref Range Status   Specimen Description SPUTUM  Final   Special Requests NONE  Final   Sputum evaluation   Final    Sputum specimen not acceptable for testing.  Please recollect.   C/CHELSEA KNIGHT AT [id number] 08/23/2019.PMF Performed at [facility], [address] [place], [place] [address]    Report Status 08/23/2019 FINAL  Final  Culture, expectorated sputum-assessment     Status: None   Collection Time: 08/24/19 11:25 AM   Specimen: Expectorated Sputum  Result Value Ref Range Status   Specimen Description EXPECTORATED SPUTUM  Final   Special Requests NONE  Final   Sputum evaluation   Final    Sputum specimen not acceptable for testing.  Please recollect.   CALLED TO ASHLEY RAMIREZ @1846  08/24/19 MJU Performed at [facility], [address] [place], [place] [address]    Report Status 08/24/2019 FINAL  Final         Radiology Studies: No results found.      Scheduled Meds: . Chlorhexidine Gluconate Cloth  6 each Topical Q0600  . enoxaparin (LOVENOX) injection  40 mg Subcutaneous Q24H  . feeding supplement (GLUCERNA SHAKE)  237 mL Oral TID BM  . ferrous sulfate  325 mg Oral Q breakfast  . folic acid  1 mg Oral Daily  . insulin aspart  0-9 Units Subcutaneous TID WC  . insulin glargine  10 Units Subcutaneous QHS  . mupirocin ointment  1 application Nasal BID  . pantoprazole  40 mg Oral Daily   Continuous Infusions: . sodium chloride Stopped (08/24/19 1759)  . ceFEPime (MAXIPIME) IV Stopped (08/25/19 1203)  . vancomycin 1,000 mg (08/25/19 1534)     LOS: 4 days    Time spent: 25 minutes  Suann Larry Danford   [facility] 08/25/2019, 5:57 PM     Please page though [place] or Epic secure chat:  For [employer], Adult nurse

## 2019-08-25 NOTE — Evaluation (Signed)
Clinical/Bedside Swallow Evaluation Patient Details  Name: Julia Shea MRN: [id number] Date of Birth: 09/30/1956  Today's Date: 08/25/2019 Time: SLP Start Time (ACUTE ONLY): 1150 SLP Stop Time (ACUTE ONLY): 1250 SLP Time Calculation (min) (ACUTE ONLY): 60 min  Past Medical History:  Past Medical History:  Diagnosis Date  . Asthma   . Bilateral hip pain   . 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 Chronic indwelling Foley catheter   . Closed comminuted left humeral fracture    ORIF in June 2017  . Depression   . Diabetes mellitus type 2, with complication, on long term insulin pump ([place])   . Falls frequently   . Hypertension   . MRSA infection   . Nausea & vomiting   . Rotator cuff tear arthropathy, left    Past Surgical History:  Past Surgical History:  Procedure Laterality Date  . LAPAROSCOPIC OOPHORECTOMY     removal of cyst   HPI:  Pt is an 63 y.o. female with history of asthma, bilateral hip pain, depression, hypertension, history of MRSA infection, insulin-dependent diabetes mellitus, CKD stage III.  She presented to the hospital because of nausea, vomiting, generalized weakness and left hip pain.  She has had pain in her left hip for about a week.  She does not remember whether she had a fall or not.  Pain was quite severe.  It was nonradiating and there are no known relieving or aggravating factors.  She says that she had not been able to walk for about a week now and she was laying in bed all the time.  Reportedly, EMS went to her house, she was found laying in feces and urine and she was covered/surrounded by what appeared to be bedbugs.  She said she has been vomiting intermittently for about a week now she feels very weak and tired.  She has an occasional cough productive of clear sputum.  She said she has lost some weight but she does not know how much she has lost.  She has poor oral intake but she has  been trying to drink some soup.  Pt was admitted w/ dxs of acute UTI, Acute hyponatremia likely due to dehydration, AKI on CKD stage III: AKI is likely prerenal from dehydration, IDDM with severe hyperglycemia, Multiple cavitary lesions in the left lung, Low BMI/severe protein, malnutrition, and possible left Breast mass. Pt is currently on airborne isolation for r/o of TB.    Assessment / Plan / Recommendation Clinical Impression  Pt appears to present w/ grossly adequate oropharyngeal phase swallowing function w/ no immediate, clinical s/s of aspiration noted during calm drinking of liquids w/ less talking and more focus on following the general aspiration precautions. Pt required verbal cues throughout and redirection at times w/ tasks -- unsure of pt's baseline Cognitive functioning level as much of the verbal engagement appeared at a more basic level. Pt needed much support w/ pillows for comfort and sitting forward to feed self - pt able to Hold the Cup to drink which increases her safety w/ thin liquid intake; and feed self. W/ support and general aspiration precautions, pt appears to be able to consume a semi-modified diet w/out immediate, overt clinical s/s of aspiration noted. Pt did cough x1 during drinking of thin liquids -- this appeared directly related to the fact that she was crying/labile at the same time. Coughing or other overt s/s were Not noted again  when she drank calmly, slowly w/ single sips. Pt consumed boluses of thin liquids via cup/straw, purees, and softened solids broken down and moistened. No wet vocal quality or decline in respiratory effort/status noted b/t or post trials. Oral motor movements/function appeared [facility] w/ no oral weakness noted during bolus management; individual movements. Speech clear. During the oral phase, pt exhibited adequate bolus management and A-P transfer for swallowing w/ most trials; oral clearing achieved given time w/ the increased textured foods. Pt is  missing few Dentition. Pt required min cues intermittently for follow through of precautions to drink slowly and take her time. Recommend a Dysphagia 2 diet (MINCED meats, gravy added) w/ Thin liquids (via CUP if difficulty noted w/ straw drinking); Pills in Puree for safer, easier swallowing if any difficulty noted when swallowing w/ liquids (this has not been noted per NSG report). Recommend general aspiration precautions; support at meals for setup and sitting upright. ST services will monitor for any further needs next 2-3 days; continue to monitor pt's Pulmonary status. There is risk for aspiration d/t declined Pulmonary status in general, however, pt has not had any frequency in CXR Imaging reported in chart notes to indicate aspiration/dysphagia --- the last CXR in 12/2017 reported "She does not have any frequency in CXR Imaging to suggest pulmonary issues --- the last was 01/23/2018 - "No active disease". She did have episodes of N/V for ~1 week prior to admit. Recommend dietician f/u for nutritional support.  If concerns of prandial aspiration then an objective swallow assessment can be performed when appropriate.  SLP Visit Diagnosis: Dysphagia, oral phase (R13.11)(missing some dentition)    Aspiration Risk  Risk for inadequate nutrition/hydration(reduced following aspiration precautions)    Diet Recommendation  Dysphagia level 2 (MINCED foods w/ gravy) and Thin liquids; monitor straw use. General aspiration precautions; assistance w/ setup and positioning at meals. Reduce distractions during meals; talking.  Medication Administration: Whole meds with liquid(but whole in puree if any trouble w/ water)    Other  Recommendations Recommended Consults: (Dietician f/u) Oral Care Recommendations: Oral care BID;Staff/trained caregiver to provide oral care Other Recommendations: (n/a)   Follow up Recommendations None(TBD)      Frequency and Duration min 2x/week  1 week       Prognosis Prognosis  for Safe Diet Advancement: Fair(-Good) Barriers to Reach Goals: (extended illness)      Swallow Study   General Date of Onset: 08/21/19 HPI: Pt is an 63 y.o. female with history of asthma, bilateral hip pain, depression, hypertension, history of MRSA infection, insulin-dependent diabetes mellitus, CKD stage III.  She presented to the hospital because of nausea, vomiting, generalized weakness and left hip pain.  She has had pain in her left hip for about a week.  She does not remember whether she had a fall or not.  Pain was quite severe.  It was nonradiating and there are no known relieving or aggravating factors.  She says that she had not been able to walk for about a week now and she was laying in bed all the time.  Reportedly, EMS went to her house, she was found laying in feces and urine and she was covered/surrounded by what appeared to be bedbugs.  She said she has been vomiting intermittently for about a week now she feels very weak and tired.  She has an occasional cough productive of clear sputum.  She said she has lost some weight but she does not know how much she has lost.  She has poor oral intake but she has been trying to drink some soup.  Pt was admitted w/ dxs of acute UTI, Acute hyponatremia likely due to dehydration, AKI on CKD stage III: AKI is likely prerenal from dehydration, IDDM with severe hyperglycemia, Multiple cavitary lesions in the left lung, Low BMI/severe protein, malnutrition, and possible left Breast mass. Pt is currently on airborne isolation for r/o of TB.  Type of Study: Bedside Swallow Evaluation Previous Swallow Assessment: none reported Diet Prior to this Study: Dysphagia 1 (puree);Thin liquids Temperature Spikes Noted: No(wbc 11.7 down from 20) Respiratory Status: Room air History of Recent Intubation: No Behavior/Cognition: Alert;Pleasant mood;Cooperative;Distractible;Requires cueing Oral Cavity Assessment: Within Functional Limits Oral Care Completed by  SLP: Yes Oral Cavity - Dentition: Missing dentition;Poor condition Vision: Functional for self-feeding Self-Feeding Abilities: Able to feed self;Needs assist;Needs set up Patient Positioning: Upright in bed(needed positioning - back discomfort(NSG aware)) Baseline Vocal Quality: Normal Volitional Cough: Strong Volitional Swallow: Able to elicit    Oral/Motor/Sensory Function Overall Oral Motor/Sensory Function: Within functional limits(no unilateral weakness noted; speech clear)   Ice Chips Ice chips: Within functional limits Presentation: Spoon(fed; 3 trials)   Thin Liquid Thin Liquid: Within functional limits Presentation: Cup;Self Fed;Straw(3 trials via cup; 6 trials via straw) Other Comments: pt coughed x1 during drinking of thin liquids -- this appeared directly related to the fact that she was crying/labile at the same time. Coughing was not noted when she drank calmly, slowly w/ single sips.     Nectar Thick Nectar Thick Liquid: Not tested   Honey Thick Honey Thick Liquid: Not tested   Puree Puree: Within functional limits Presentation: Spoon;Self Fed(8 trials)   Solid     Solid: Impaired(mech soft trials) Presentation: Self Fed;Spoon(6 trials of graham crackers w/ puree) Oral Phase Impairments: Impaired mastication(missing some dentition) Oral Phase Functional Implications: Impaired mastication;Prolonged oral transit(missing some dentition) Pharyngeal Phase Impairments: (none) Other Comments: pt often talked during/immediate after trials       [name], MS, CCC-SLP , 08/25/2019,2:47 PM

## 2019-08-25 NOTE — Progress Notes (Signed)
OT Cancellation Note  Patient Details Name: Julia Shea MRN: [id number] DOB: 1956-03-23   Cancelled Treatment:    Reason Eval/Treat Not Completed: Other (comment). Consult received, chart reviewed. Therapist preparing to enter pt's room. RN reports pt in significant pain and requiring nursing care. RN requests therapy to come back later. Will re-attempt at later date/time as appropriate.   Jeni Salles, MPH, MS, OTR/L ascom [phone] 08/25/19, 12:17 PM

## 2019-08-25 NOTE — Progress Notes (Signed)
   Date of Admission:  08/21/2019  *   ID: Julia Shea is a 63 y.o. female  Principal Problem:   Hyponatremia Active Problems:   Diabetes mellitus type 2, uncontrolled ([place])   Acute lower UTI   AKI (acute kidney injury) ([place])    Subjective: Says she is feeling a little better Still has cough   Medications:  . Chlorhexidine Gluconate Cloth  6 each Topical Q0600  . enoxaparin (LOVENOX) injection  40 mg Subcutaneous Q24H  . feeding supplement (GLUCERNA SHAKE)  237 mL Oral TID BM  . ferrous sulfate  325 mg Oral Q breakfast  . folic acid  1 mg Oral Daily  . insulin aspart  0-9 Units Subcutaneous TID WC  . insulin glargine  10 Units Subcutaneous QHS  . mupirocin ointment  1 application Nasal BID  . pantoprazole  40 mg Oral Daily    Objective: Vital signs in last 24 hours: Temp:  [97.7 F (36.5 C)-97.9 F (36.6 C)] 97.9 F (36.6 C) (11/24 0345) Pulse Rate:  [81-90] 88 (11/24 1332) Resp:  [18] 18 (11/24 0804) BP: (92-112)/(56-69) 92/69 (11/24 1332) SpO2:  [97 %-99 %] 98 % (11/24 1332)  PHYSICAL EXAM:  General:somnolent, responds appropriately Lungs: b/l air entry Heart: tachycardia Abdomen: Soft, non-tender,not distended. Bowel sounds normal. No masses Extremities: venous stasis/venous pigmentation/venous edema b/l with excoriations Skin: No rashes or lesions. Or bruising Lymph: Cervical, supraclavicular normal. Neurologic: Grossly non-focal  Lab Results Recent Labs    08/24/19 0413 08/25/19 0408  WBC 14.1* 11.7*  HGB 7.2* 7.1*  HCT 21.5* 21.8*  NA 132* 134*  K 4.1 3.9  CL 108 106  CO2 19* 20*  BUN 27* 22  CREATININE 0.79 0.83   Liver Panel No results for input(s): PROT, ALBUMIN, AST, ALT, ALKPHOS, BILITOT, BILIDIR, IBILI in the last 72 hours. Sedimentation Rate No results for input(s): ESRSEDRATE in the last 72 hours. C-Reactive Protein No results for input(s): CRP in the last 72 hours.  Microbiology:  Studies/Results: No results  found.   Assessment/Plan:  Multiple cavitary lesions in the lung. D.D septic emboli with cavitation , MRSA pneumonia,  atypical infection like MAC/TB less likely but will get sputum, non infectious causes like wegener's,  Will change meropenem to cefepime continue vanco ( she has taken cefepime and ceftriaxone in her previous admissions in the ED)  MRSA nares  Hyponatremia   Leukocytosis  Anemia  AKI- resolved- likely prerenal  Question of ca breast on CT chest- no obvious lesion seen - palpation limited  Increased ESR of 126 ? MRSA infection in the past I only see coag neg staph in the blood in April 2019 and MRSA nares  ? Discussed the management with the patient

## 2019-08-25 NOTE — Evaluation (Signed)
Occupational Therapy Evaluation Patient Details Name: Julia Shea MRN: [id number] DOB: 1956/09/22 Today's Date: 08/25/2019    History of Present Illness Julia Shea is a 63 y.o. F with HTN, DM II, Depression, frequent falls, and asthma who was admitted 11/20 with N/V, generalized weakness and left hip pain. EMS were called for a welfare check by neighbors, reported finding her lying in feces, urine and possible bedbugs.    Clinical Impression   Pt seen for OT evaluation this date. Pt reporting 8/10 pain but does not elaborate on where and visually demonstrates little to no pain. Pt requesting sleep, becomes increasingly agitated by questions and attempts to encourage/educate pt in benefits of therapy. PLOG unclear. Pt plans to go to rehab to get stronger prior to return home where she says she lives alone.  Currently pt demonstrates impairments in strength, pain, balance, and activity tolerance requiring significant assist ADL and mobility. Pt would benefit from skilled OT to address noted impairments and functional limitations (see below for any additional details) in order to maximize safety and independence while minimizing falls risk and caregiver burden.  Upon hospital discharge, recommend pt discharge to SNF.    Follow Up Recommendations  SNF    Equipment Recommendations  3 in 1 bedside commode    Recommendations for Other Services       Precautions / Restrictions Precautions Precautions: Fall;Other (comment) Precaution Comments: airborne precautions; bed bugs at her home (consider contact precautions out of abundance of precaution) Restrictions Weight Bearing Restrictions: No      Mobility Bed Mobility               General bed mobility comments: pt declines 2/2 pain and fatigue despite education and encouragement  Transfers                 General transfer comment: pt declines 2/2 pain and fatigue despite education and encouragement    Balance                                           ADL either performed or assessed with clinical judgement   ADL Overall ADL's : Needs assistance/impaired Eating/Feeding: Modified independent   Grooming: Bed level;Minimal assistance   Upper Body Bathing: Bed level;Moderate assistance;Minimal assistance   Lower Body Bathing: Bed level;Maximal assistance   Upper Body Dressing : Bed level;Minimal assistance;Moderate assistance   Lower Body Dressing: Bed level;Maximal assistance                       Vision Baseline Vision/History: Cataracts;Legally blind Patient Visual Report: No change from baseline       Perception     Praxis      Pertinent Vitals/Pain Pain Assessment: 0-10 Pain Score: 8  Pain Location: pt does not elaborate when asked where Pain Intervention(s): Limited activity within patient's tolerance;Monitored during session     Hand Dominance Right   Extremity/Trunk Assessment Upper Extremity Assessment Upper Extremity Assessment: Generalized weakness   Lower Extremity Assessment Lower Extremity Assessment: Defer to PT evaluation       Communication Communication Communication: No difficulties   Cognition Arousal/Alertness: Awake/alert Behavior During Therapy: Agitated Overall Cognitive Status: No family/caregiver present to determine baseline cognitive functioning  General Comments: pt becomes agitated with basic PLOF questions and requests rest   General Comments       Exercises Other Exercises Other Exercises: Pt educated in benefits of participation in therapy to improve impairments in order to improve functional independence with ADL and mobility   Shoulder Instructions      Home Living Family/patient expects to be discharged to:: Skilled nursing facility                                        Prior Functioning/Environment Level of Independence: Needs assistance         Comments: PT reports needing help for a lot of things before the hospital but declines to elaborate        OT Problem List: Decreased strength;Decreased range of motion;Pain;Decreased activity tolerance;Impaired balance (sitting and/or standing);Decreased knowledge of use of DME or AE;Impaired vision/perception      OT Treatment/Interventions: Self-care/ADL training;Therapeutic exercise;Therapeutic activities;DME and/or AE instruction;Patient/family education;Balance training;Visual/perceptual remediation/compensation    OT Goals(Current goals can be found in the care plan section) Acute Rehab OT Goals Patient Stated Goal: get some rest and get better OT Goal Formulation: With patient Time For Goal Achievement: 09/08/19 Potential to Achieve Goals: Good ADL Goals Pt Will Perform Grooming: sitting;with supervision;with set-up Pt Will Perform Lower Body Dressing: with min assist;sitting/lateral leans Pt Will Transfer to Toilet: bedside commode;stand pivot transfer;with mod assist  OT Frequency: Min 1X/week   Barriers to D/C: Decreased caregiver support          Co-evaluation              AM-PAC OT "6 Clicks" Daily Activity     Outcome Measure Help from another person eating meals?: None Help from another person taking care of personal grooming?: A Little Help from another person toileting, which includes using toliet, bedpan, or urinal?: Total Help from another person bathing (including washing, rinsing, drying)?: A Lot Help from another person to put on and taking off regular upper body clothing?: A Lot Help from another person to put on and taking off regular lower body clothing?: A Lot 6 Click Score: 14   End of Session Nurse Communication: Other (comment)(BP)  Activity Tolerance: Patient limited by fatigue;Patient limited by pain Patient left: in bed;with call bell/phone within reach;with bed alarm set  OT Visit Diagnosis: Other abnormalities of gait and mobility  (R26.89);Repeated falls (R29.6);Muscle weakness (generalized) (M62.81)                Time: [id number] OT Time Calculation (min): 17 min Charges:  OT General Charges $OT Visit: 1 Visit OT Evaluation $OT Eval Moderate Complexity: 1 Mod  Jeni Salles, MPH, MS, OTR/L ascom [phone] 08/25/19, 1:57 PM

## 2019-08-25 NOTE — Progress Notes (Signed)
Ch received a referral to visit with pt who is celebrating a b-day today. Pt is a 63 y.o. female that has been experiencing frequent falls at home and may have limited support with her basic needs. Ch called pt to provided social support and words of encouragement. Pt presented to hv a flat affect and could benefit for continual support from the care team. Ch phone call was appreciated.  F/U recommended.    08/25/19 1200  Clinical Encounter Type  Visited With Patient;Health care provider  Visit Type Follow-up;Spiritual support;Social support  Referral From Other (Comment) (sph ther)  Consult/Referral To Chaplain  Recommendations f/u with social support   Spiritual Encounters  Spiritual Needs Other (Comment) (birthday wishes!)  Stress Factors  Patient Stress Factors Health changes;Lack of caregivers;Loss of control;Major life changes  Family Stress Factors None identified

## 2019-08-25 NOTE — Plan of Care (Signed)

## 2019-08-25 NOTE — Progress Notes (Signed)
PT Cancellation Note  Patient Details Name: Julia Shea MRN: [id number] DOB: [birth year]   Cancelled Treatment:    Reason Eval/Treat Not Completed: Other (comment). Per OT, pt currently refusing therapy this date. Will re-attempt next date.   Kharon Hixon 08/25/2019, 1:46 PM Greggory Stallion, PT, DPT [phone]

## 2019-08-26 DIAGNOSIS — E1165 Type 2 diabetes mellitus with hyperglycemia: Secondary | ICD-10-CM

## 2019-08-26 DIAGNOSIS — L89312 Pressure ulcer of right buttock, stage 2: Secondary | ICD-10-CM

## 2019-08-26 DIAGNOSIS — Z720 Tobacco use: Secondary | ICD-10-CM

## 2019-08-26 DIAGNOSIS — I959 Hypotension, unspecified: Secondary | ICD-10-CM

## 2019-08-26 DIAGNOSIS — E43 Unspecified severe protein-calorie malnutrition: Secondary | ICD-10-CM

## 2019-08-26 DIAGNOSIS — J984 Other disorders of lung: Secondary | ICD-10-CM

## 2019-08-26 LAB — QUANTIFERON-TB GOLD PLUS (RQFGPL)
QuantiFERON Mitogen Value: 0.14 IU/mL
QuantiFERON Nil Value: 0.06 IU/mL
QuantiFERON TB1 Ag Value: 0.07 IU/mL
QuantiFERON TB2 Ag Value: 0.03 IU/mL

## 2019-08-26 LAB — GLUCOSE, CAPILLARY
Glucose-Capillary: 109 mg/dL — ABNORMAL HIGH (ref 70–99)
Glucose-Capillary: 184 mg/dL — ABNORMAL HIGH (ref 70–99)
Glucose-Capillary: 264 mg/dL — ABNORMAL HIGH (ref 70–99)
Glucose-Capillary: 198 mg/dL — ABNORMAL HIGH (ref 70–99)

## 2019-08-26 LAB — CBC
HCT: 22.7 % — ABNORMAL LOW (ref 36.0–46.0)
Hemoglobin: 7.5 g/dL — ABNORMAL LOW (ref 12.0–15.0)
MCH: 24.8 pg — ABNORMAL LOW (ref 26.0–34.0)
MCHC: 33 g/dL (ref 30.0–36.0)
MCV: 74.9 fL — ABNORMAL LOW (ref 80.0–100.0)
Platelets: 370 10*3/uL (ref 150–400)
RBC: 3.03 MIL/uL — ABNORMAL LOW (ref 3.87–5.11)
RDW: 14.5 % (ref 11.5–15.5)
WBC: 11.4 10*3/uL — ABNORMAL HIGH (ref 4.0–10.5)
nRBC: 0 % (ref 0.0–0.2)

## 2019-08-26 LAB — BASIC METABOLIC PANEL
Anion gap: 7 (ref 5–15)
BUN: 22 mg/dL (ref 8–23)
CO2: 22 mmol/L (ref 22–32)
Calcium: 7.8 mg/dL — ABNORMAL LOW (ref 8.9–10.3)
Chloride: 106 mmol/L (ref 98–111)
Creatinine, Ser: 0.73 mg/dL (ref 0.44–1.00)
GFR calc Af Amer: >60 mL/min (ref >60)
GFR calc non Af Amer: >60 mL/min (ref >60)
Glucose, Bld: 130 mg/dL — ABNORMAL HIGH (ref 70–99)
Potassium: 3.8 mmol/L (ref 3.5–5.1)
Sodium: 135 mmol/L (ref 135–145)

## 2019-08-26 LAB — MISC LABCORP TEST (SEND OUT)

## 2019-08-26 LAB — CULTURE, BLOOD (ROUTINE X 2)
Culture: NO GROWTH
Culture: NO GROWTH
Special Requests: ADEQUATE
Special Requests: ADEQUATE

## 2019-08-26 LAB — QUANTIFERON-TB GOLD PLUS: QuantiFERON-TB Gold Plus: UNDETERMINED — AB

## 2019-08-26 LAB — HISTOPLASMA GAL'MANNAN AG SER: Histoplasma Gal'mannan Ag Ser: POSITIVE — AB (ref <0.5)

## 2019-08-26 LAB — HISTOPLASMA GAL'MANNAN AG, QN REFLEX: Histoplasma Gal'mannan Ag Qn S: 0.9 ng/mL

## 2019-08-26 MED ORDER — ENSURE MAX PROTEIN PO LIQD
11.0000 [oz_av] | Freq: Two times a day (BID) | ORAL | Status: DC
  Administered 2019-08-26 – 2019-09-06 (×14): 11 [oz_av] via ORAL
  Administered 2019-09-07: 237 mL via ORAL
  Administered 2019-09-08: 11 [oz_av] via ORAL
  Filled 2019-08-26: qty 330

## 2019-08-26 MED ORDER — LINEZOLID 600 MG PO TABS
600.0000 mg | ORAL_TABLET | Freq: Two times a day (BID) | ORAL | Status: DC
  Administered 2019-08-26 – 2019-09-01 (×11): 600 mg via ORAL
  Filled 2019-08-26 (×14): qty 1

## 2019-08-26 MED ORDER — TUBERCULIN PPD 5 UNIT/0.1ML ID SOLN
5.0000 [IU] | Freq: Once | INTRADERMAL | Status: AC
  Administered 2019-08-26: 5 [IU] via INTRADERMAL
  Filled 2019-08-26 (×2): qty 0.1

## 2019-08-26 MED ORDER — ACETYLCYSTEINE 20 % IN SOLN
4.0000 mL | Freq: Two times a day (BID) | RESPIRATORY_TRACT | Status: DC
  Administered 2019-08-26 – 2019-08-28 (×3): 4 mL via RESPIRATORY_TRACT
  Filled 2019-08-26 (×4): qty 4

## 2019-08-26 NOTE — NC FL2 (Signed)
[place] LEVEL OF CARE SCREENING TOOL     IDENTIFICATION  Patient Name: Julia Shea Birthdate: 1955-12-13 Sex: female Admission Date (Current Location): 08/21/2019  [place] and [place] Number:  Engineering geologist and Address:  [facility], [address] [place], [place] [address]      Provider Number: [id number]  Attending Physician Name and Address:  Loletha Grayer, MD  Relative Name and Phone Number:  Murray Hodgkins [phone]    Current Level of Care: Hospital Recommended Level of Care: [place] Prior Approval Number:    Date Approved/Denied:   PASRR Number: [id number]  Discharge Plan: SNF    Current Diagnoses: Patient Active Problem List   Diagnosis Date Noted  . Cavitary lung disease   . Hypotension   . Hyponatremia 08/21/2019  . Acute lower UTI 08/21/2019  . AKI (acute kidney injury) ([place]) 08/21/2019  . Severe malnutrition ([place]) 03/31/2018  . Hypokalemia 03/29/2018  . Diabetes mellitus type 2, with complication, on long term insulin pump ([place])   . Nausea & vomiting   . Chronic indwelling Foley catheter   . Bladder problem   . Rotator cuff tear arthropathy, left   . Malnutrition of moderate degree 01/24/2018  . Pressure injury of skin 12/22/2017  . Hepatic steatosis 05/15/2013  . Abdominal pain, right upper quadrant 05/07/2013  . Breast cancer screening 11/14/2011  . Hypertriglyceridemia 10/30/2011  . Depression 10/26/2011  . Diabetes mellitus type 2, uncontrolled ([place]) 10/26/2011  . Hypertension 10/26/2011    Orientation RESPIRATION BLADDER Height & Weight     Self, Time, Situation, Place  Normal Incontinent Weight: 52.7 kg Height:  5\' 2"  (157.5 cm)  BEHAVIORAL SYMPTOMS/MOOD NEUROLOGICAL BOWEL NUTRITION STATUS      Incontinent Diet(Dysphagia diet 2)  AMBULATORY STATUS COMMUNICATION OF NEEDS Skin   Total Care Verbally Other (Comment), Skin abrasions(MASD, wound right buttock)                        Personal Care Assistance Level of Assistance  Bathing, Feeding, Dressing Bathing Assistance: Maximum assistance Feeding assistance: Limited assistance Dressing Assistance: Maximum assistance     Functional Limitations Info             SPECIAL CARE FACTORS FREQUENCY  PT (By licensed PT), OT (By licensed OT)     PT Frequency: 5 times per week OT Frequency: 5 times per week            Contractures Contractures Info: Not present    Additional Factors Info  Code Status, Allergies Code Status Info: full Allergies Info: PCN, Sulfa           Current Medications (08/26/2019):  This is the current hospital active medication list Current Facility-Administered Medications  Medication Dose Route Frequency Provider Last Rate Last Dose  . 0.9 %  sodium chloride infusion   Intravenous PRN Edwin Dada, MD   Stopped at 08/24/19 1759  . acetaminophen (TYLENOL) tablet 650 mg  650 mg Oral Q6H PRN Jennye Boroughs, MD   650 mg at 08/26/19 [id number]   Or  . acetaminophen (TYLENOL) suppository 650 mg  650 mg Rectal Q6H PRN Jennye Boroughs, MD      . Chlorhexidine Gluconate Cloth 2 % PADS 6 each  6 each Topical Q0600 Max Sane, MD   6 each at 08/26/19 0549  . enoxaparin (LOVENOX) injection 40 mg  40 mg Subcutaneous Q24H Edwin Dada, MD   40 mg at 08/26/19  1227  . ferrous sulfate tablet 325 mg  325 mg Oral Q breakfast Edwin Dada, MD   325 mg at 08/26/19 0953  . folic acid (FOLVITE) tablet 1 mg  1 mg Oral Daily Danford, Suann Larry, MD   1 mg at 08/26/19 0953  . insulin aspart (novoLOG) injection 0-9 Units  0-9 Units Subcutaneous TID WC Jennye Boroughs, MD   2 Units at 08/26/19 1227  . insulin glargine (LANTUS) injection 10 Units  10 Units Subcutaneous QHS Jennye Boroughs, MD   10 Units at 08/25/19 2301  . linezolid (ZYVOX) tablet 600 mg  600 mg Oral Q12H Ravishankar, Jayashree, MD      . mupirocin ointment (BACTROBAN) 2 % 1 application  1  application Nasal BID Max Sane, MD   1 application at [id number]  . pantoprazole (PROTONIX) EC tablet 40 mg  40 mg Oral Daily Jennye Boroughs, MD   40 mg at 08/26/19 0953  . polyethylene glycol (MIRALAX / GLYCOLAX) packet 17 g  17 g Oral Daily PRN Jennye Boroughs, MD   17 g at 08/24/19 0900  . protein supplement (ENSURE MAX) liquid  11 oz Oral BID BM Loletha Grayer, MD   11 oz at 08/26/19 1442  . tuberculin injection 5 Units  5 Units Intradermal Once Loletha Grayer, MD         Discharge Medications: Please see discharge summary for a list of discharge medications.  Relevant Imaging Results:  Relevant Lab Results:   Additional Information SSN [id number]  Shelbie Hutching, RN

## 2019-08-26 NOTE — Consult Note (Signed)
Pharmacy Antibiotic Note  Julia Shea is a 63 y.o. female with medical history of hypertension, diabetes, frequent falls, asthma, admitted on 08/21/2019 with pneumonia. Imaging with cavitary lesions c/f atypical infections. Per chart, patient has reported weight loss, sweats, fevers, chills fatigue. Patient with history of MRSA infection (pneumonia?)- was not able to find personally in chart. UA with many bacteria, >50 RBC, >50 WBC. Pharmacy has been consulted for vancomycin and meropenem dosing. Pulmonology following.    Afebrile x 24h. WBC 14.1. Patient is not hypoxic per chart and is on room air. Creatinine has continued to improve since admission.   Plan: Continue Cefepime 2g q12  Will continue Vancomycin 1000 mg IV Q 24 hrs.  Goal AUC 400-550. Expected AUC: 516.4, trough 11.5 SCr used: 0.8  Serum creatinine stable x 3 days - will recheck with Friday am labs.   Levels approaching steady state, will strongly consider checking peak and trough levels with Friday dosing if pt continues Vancomycin.   Height: 5\' 2"  (157.5 cm) Weight: 116 lb 1.6 oz (52.7 kg) IBW/kg (Calculated) : 50.1  Temp (24hrs), Avg:97.8 F (36.6 C), Min:97.4 F (36.3 C), Max:98.4 F (36.9 C)  Recent Labs  Lab 08/22/19 0813 08/23/19 0421 08/24/19 0413 08/25/19 0408 08/26/19 0322  WBC 17.4* 14.2* 14.1* 11.7* 11.4*  CREATININE 1.10* 0.96 0.79 0.83 0.73    Estimated Creatinine Clearance: 55.4 mL/min (by C-G formula based on SCr of 0.73 mg/dL).    Allergies  Allergen Reactions  . Penicillins     Has patient had a PCN reaction causing immediate rash, facial/tongue/throat swelling, SOB or lightheadedness with hypotension: Yes Has patient had a PCN reaction causing severe rash involving mucus membranes or skin necrosis: Yes Has patient had a PCN reaction that required hospitalization: Yes Has patient had a PCN reaction occurring within the last 10 years: Yes If all of the above answers are "NO", then may  proceed with Cephalosporin use.  . Sulfa Antibiotics     Antimicrobials this admission: Levofloxacin 11/20 x 1 Meropenem 11/21 >> 11/24 Vancomycin 11/21 >>  Cefepime 11/24 >>  Dose adjustments this admission: 11/23 - Adjusted Vancomycin from 750mg  q24 to 1000mg  q24  Microbiology results: 11/20 COVID-19: negative 11/20 BCx: no growth x 5 days final 11/20 UCx: Lactobacillus  11/21 Sputum: untestable 11/21 MRSA PCR: positive 11/21 AFB culture/smear: pending  Thank you for allowing pharmacy to be a part of this patient's care.  Lu Duffel, PharmD, BCPS Clinical Pharmacist 08/26/2019 7:36 AM

## 2019-08-26 NOTE — Progress Notes (Signed)
Physical Therapy Treatment Patient Details Name: Julia Shea MRN: [id number] DOB: 1956-02-21 Today's Date: 08/26/2019    History of Present Illness Julia Shea is a 63 y.o. F with HTN, DM II, Depression, frequent falls, and asthma who was admitted 11/20 with N/V, generalized weakness and left hip pain. EMS were called for a welfare check by neighbors, reported finding her lying in feces, urine and possible bedbugs.     PT Comments    Pt is making gradual progress towards goals with increased functional mobility performed with ability to come to EOB with ease this date. Still limited by B LE strength, unable to stand at this time even with max assist. Pt able to tolerate sitting at EOB for prolonged time and complete there-ex. Pt eager to get her strength back. Of note, TB results indeterminate. Still remains on airborne precautions. Will continue to progress.   Follow Up Recommendations  SNF     Equipment Recommendations  Rolling walker with 5" wheels    Recommendations for Other Services       Precautions / Restrictions Precautions Precautions: Fall Precaution Comments: airborne precautions; bed bugs at her home (consider contact precautions out of abundance of precaution) Restrictions Weight Bearing Restrictions: No    Mobility  Bed Mobility Overal bed mobility: Needs Assistance Bed Mobility: Supine to Sit;Sit to Supine     Supine to sit: Min assist Sit to supine: Min assist   General bed mobility comments: improved technique this date with light assist for upper body. Once seated at EOB, able to improve to supervision. B knee contractures noted  Transfers Overall transfer level: Needs assistance Equipment used: Rolling walker (2 wheeled);1 person hand held assist Transfers: Sit to/from Stand Sit to Stand: Max assist         General transfer comment: 3 attempts for transfer with/without RW. Pt unable to elevated buttocks off bed with poor strength in B LEs.    Ambulation/Gait             General Gait Details: unable   Stairs             Wheelchair Mobility    Modified Rankin (Stroke Patients Only)       Balance Overall balance assessment: Needs assistance Sitting-balance support: Bilateral upper extremity supported;Feet unsupported Sitting balance-Leahy Scale: Good     Standing balance support: (unable)                                Cognition Arousal/Alertness: Awake/alert Behavior During Therapy: WFL for tasks assessed/performed Overall Cognitive Status: Within Functional Limits for tasks assessed                                        Exercises Other Exercises Other Exercises: Pt performed seated ther-ex including B LE LAQ, alt. marching, SLRs, AP, and hip abd/add. All ther-ex performed x 12 reps with min assist    General Comments        Pertinent Vitals/Pain Pain Assessment: Faces Faces Pain Scale: Hurts a little bit Pain Location: low back Pain Descriptors / Indicators: Dull;Discomfort Pain Intervention(s): Limited activity within patient's tolerance;Repositioned    Home Living                      Prior Function  PT Goals (current goals can now be found in the care plan section) Acute Rehab PT Goals Patient Stated Goal: get some rest and get better PT Goal Formulation: With patient Time For Goal Achievement: 09/06/19 Potential to Achieve Goals: Fair Progress towards PT goals: Progressing toward goals    Frequency    Min 2X/week      PT Plan Current plan remains appropriate    Co-evaluation              AM-PAC PT "6 Clicks" Mobility   Outcome Measure  Help needed turning from your back to your side while in a flat bed without using bedrails?: A Little Help needed moving from lying on your back to sitting on the side of a flat bed without using bedrails?: A Little Help needed moving to and from a bed to a chair (including a  wheelchair)?: Total Help needed standing up from a chair using your arms (e.g., wheelchair or bedside chair)?: Total Help needed to walk in hospital room?: Total Help needed climbing 3-5 steps with a railing? : Total 6 Click Score: 10    End of Session Equipment Utilized During Treatment: Gait belt Activity Tolerance: Patient limited by fatigue Patient left: in bed;with bed alarm set Nurse Communication: Mobility status PT Visit Diagnosis: Muscle weakness (generalized) (M62.81);History of falling (Z91.81);Difficulty in walking, not elsewhere classified (R26.2);Pain Pain - Right/Left: Left Pain - part of body: Hip     Time: [id number] PT Time Calculation (min) (ACUTE ONLY): 30 min  Charges:  $Therapeutic Exercise: 8-22 mins $Therapeutic Activity: 8-22 mins                     Greggory Stallion, PT, DPT [phone]    Truett Mcfarlan 08/26/2019, 12:15 PM

## 2019-08-26 NOTE — Progress Notes (Signed)
Speech Language Pathology Treatment: Dysphagia  Patient Details Name: Julia Shea MRN: [id number] DOB: 12-Mar-1956 Today's Date: 08/26/2019 Time: 9163-8466 SLP Time Calculation (min) (ACUTE ONLY): 28 min  Assessment / Plan / Recommendation Clinical Impression  Pt seen today for ongoing assessment of toleration of diet of MINCED foods moistened and thin liquids; education on general aspiration precautions including sitting fully upright when eating/drinking. Pt benefits from less distractions in her environment during oral intake. NSG staff reported good toleration of po's this morning at breakfast including cream of wheat, Milk w/ no overt s/s of aspiration reported. Noted stable labs and vitals.  Pt easily engaging; distracted at times but was easily redirected w/ verbal cues. She consumed trials of thin liquids via Cup w/ no overt s/s of aspiration noted; No wet vocal quality or decline in respiratory effort/status noted b/t or post trials. No overt coughing. Pt was instructed on Not talking during/immediately after the po trials. Oral motor movements/function appeared [facility] w/ no oral weakness noted during bolus management and oral clearing. Pt fed self w/ setup support d/t overall weakness. Verbal education and practice w/ general aspiration precautions was given. Pt gave verbal agreement to slowing down and sitting up when eating/drinking at meals.  Recommend continue the modified diet of Dysphagia level 2 (MINCED foods w/ gravies, and added Purees, Soups) w/ Thin liquids; general aspiration precautions; tray setup support and monitoring at meals as needed. Reduce Distractions at meals. Recommend Pills given in Puree IF ANY difficulty swallowing w/ liquids. NSG to reconsult ST services if any decline in status while admitted.      HPI HPI: Pt is an 63 y.o. female with history of asthma, bilateral hip pain, depression, hypertension, history of MRSA infection, insulin-dependent diabetes mellitus,  CKD stage III.  She presented to the hospital because of nausea, vomiting, generalized weakness and left hip pain.  She has had pain in her left hip for about a week.  She does not remember whether she had a fall or not.  Pain was quite severe.  It was nonradiating and there are no known relieving or aggravating factors.  She says that she had not been able to walk for about a week now and she was laying in bed all the time.  Reportedly, EMS went to her house, she was found laying in feces and urine and she was covered/surrounded by what appeared to be bedbugs.  She said she has been vomiting intermittently for about a week now she feels very weak and tired.  She has an occasional cough productive of clear sputum.  She said she has lost some weight but she does not know how much she has lost.  She has poor oral intake but she has been trying to drink some soup.  Pt was admitted w/ dxs of acute UTI, Acute hyponatremia likely due to dehydration, AKI on CKD stage III: AKI is likely prerenal from dehydration, IDDM with severe hyperglycemia, Multiple cavitary lesions in the left lung, Low BMI/severe protein, malnutrition, and possible left Breast mass. Pt is currently on airborne isolation for r/o of TB.       SLP Plan  All goals met       Recommendations  Diet recommendations: Dysphagia 2 (fine chop);Thin liquid(w/ added purees) Liquids provided via: Cup;Straw(monitor straw use) Medication Administration: Whole meds with liquid(but use a Puree IF any difficulty swallowing w/ water) Supervision: Patient able to self feed;Intermittent supervision to cue for compensatory strategies(setup) Compensations: Minimize environmental distractions;Slow rate;Small sips/bites;Lingual sweep for  clearance of pocketing;Multiple dry swallows after each bite/sip;Follow solids with liquid Postural Changes and/or Swallow Maneuvers: Seated upright 90 degrees;Upright 30-60 min after meal                General  recommendations: (Dietician f/u) Oral Care Recommendations: Oral care BID;Staff/trained caregiver to provide oral care Follow up Recommendations: None SLP Visit Diagnosis: Dysphagia, oral phase (R13.11)(poor dentition status) Plan: All goals met       GO                 [name], MS, CCC-SLP Watson,Katherine 08/26/2019, 10:33 AM

## 2019-08-26 NOTE — Progress Notes (Signed)
Nutrition Follow-up  RD working remotely.  DOCUMENTATION CODES:   Not applicable  INTERVENTION:  Will discontinue Glucerna as patient reports she is tired of them.  Provide Ensure Max Protein po BID, each supplement provides 150 kcal and 30 grams of protein. Patient prefers vanilla.  NUTRITION DIAGNOSIS:   Inadequate oral intake related to decreased appetite as evidenced by per patient/family report.  Resolving - patient's PO intake is improving.  GOAL:   Patient will meet greater than or equal to 90% of their needs  Progressing.  MONITOR:   PO intake, Supplement acceptance, Diet advancement, Labs, Weight trends, Skin, I & O's  REASON FOR ASSESSMENT:   Malnutrition Screening Tool    ASSESSMENT:   63 year old female with PMHx of HTN, depression, asthma, DM admitted after being found down by neighbors and found to have cavitary lesions in left lung undergoing work-up, UTI, hypotension, N/V, AKI, hyperglycemia, anemia, possible left breast mass.   -Following SLP evaluation on 11/24 diet was advanced to dysphagia 2 with thin liquids.  Spoke with patient over the phone. She reports her appetite is improving and she is eating more at meals. She is finishing 90-100% of her meals per her report (not documented in chart). She reports she is tired of the Glucerna and would like to try another option. Discussed Ensure Max Protein and she is willing to try the vanilla flavor (pt does not like chocolate). Denies any N/V.  Medications reviewed and include: ferrous sulfate 325 mg daily, Novolog 0-9 units TID, Lantus 10 units QHS, pantoprazole, cefepime, vancomycin.  Labs reviewed: CBG 109-240.  Diet Order:   Diet Order            DIET DYS 2 Room service appropriate? Yes with Assist; Fluid consistency: Thin  Diet effective now             EDUCATION NEEDS:   No education needs have been identified at this time  Skin:  Skin Assessment: Skin Integrity Issues:(wounds bilateral  buttocks (1cm x 1cm))  Last BM:  08/24/2019 per chart  Height:   Ht Readings from Last 1 Encounters:  08/21/19 5\' 2"  (1.575 m)   Weight:   Wt Readings from Last 1 Encounters:  08/22/19 52.7 kg   Ideal Body Weight:  50 kg  BMI:  Body mass index is 21.23 kg/m.  Estimated Nutritional Needs:   Kcal:  1400-1600  Protein:  70-80 grams  Fluid:  1.4-1.6 L/day  Jacklynn Barnacle, MS, RD, LDN Office: [phone] Pager: [phone] After Hours/Weekend Pager: [phone]

## 2019-08-26 NOTE — Progress Notes (Signed)
   Date of Admission:  08/21/2019      Subjective: Status quo No fever Some cough but not bringing up good sputum for testing eventhough she says she has been spitting out  Medications:  . Chlorhexidine Gluconate Cloth  6 each Topical Q0600  . enoxaparin (LOVENOX) injection  40 mg Subcutaneous Q24H  . ferrous sulfate  325 mg Oral Q breakfast  . folic acid  1 mg Oral Daily  . insulin aspart  0-9 Units Subcutaneous TID WC  . insulin glargine  10 Units Subcutaneous QHS  . mupirocin ointment  1 application Nasal BID  . pantoprazole  40 mg Oral Daily  . Ensure Max Protein  11 oz Oral BID BM    Objective: Vital signs in last 24 hours: Temp:  [97.2 F (36.2 C)-98.4 F (36.9 C)] 97.2 F (36.2 C) (11/25 0806) Pulse Rate:  [68-88] 76 (11/25 0806) Resp:  [14-18] 17 (11/25 0806) BP: (92-118)/(59-69) 103/68 (11/25 0806) SpO2:  [94 %-100 %] 100 % (11/25 0806)  PHYSICAL EXAM:  General: Alert, cooperative, no distress, pale  poor dentition Chest b/l air entry Hss 1s2 Extremities: venous edema/dermatitis Neurologic: Grossly non-focal  Lab Results Recent Labs    08/25/19 0408 08/26/19 0322  WBC 11.7* 11.4*  HGB 7.1* 7.5*  HCT 21.8* 22.7*  NA 134* 135  K 3.9 3.8  CL 106 106  CO2 20* 22  BUN 22 22  CREATININE 0.83 0.73    Microbiology: 08/21/2019 blood culture negative 08/22/2019 MRSA nares PCR positive  Studies/Results: CT chest multiple cavitary masses lesions and lung nodules seen.    Assessment/Plan:    Multiple cavitary lesions lungs-  Thick walled cavity .  DD infection versus noninfectious cause  .MRSA nares. could this be multifocal MRSA pneumonia versus septic emboli Blood culture is negative 2D echo shows grossly normal tricuspid valve/mitral valve.  Aortic valve and pulmonary valve was not well visualized.  EF is 60 to 65%. Could this be atypical infection like tuberculosis or atypical mycobacteria or fungal. Not clear what the histoplasma  galactomannan of one 0.9 significance is.  Will await beta D glucan. Could this be autoimmune condition like Wegener's? could this be malignancy. May need bronchoscopy and BAL for cultures and cytology  Indeterminate quantiferon gold.  Patient is unable to get sputum.  Hospitalist checking PPD.  Hyponatremia has resolved  Anemia AKI resolved  Smoker  Discussed the management with the hospitalist and her nurse. ID will follow her remotely tomorrow.

## 2019-08-26 NOTE — TOC Initial Note (Signed)
Transition of Care [facility]) - Initial/Assessment Note    Patient Details  Name: Shreena Eiland MRN: [id number] Date of Birth: 1956/03/28  Transition of Care [facility] CM/SW Contact:    Shelbie Hutching, RN Phone Number: 08/26/2019, 3:45 PM  Clinical Narrative:                 Patient admitted with hyponatremia.  Patient was found at home per report covered in her own feces and bugs, what EMS thought were bedbugs.  Patient reports that she cannot walk and has a good friend that takes care of her.  Patient's sister in law reports that the patient has not walked in years.   Patient is not safe at home, brother and sister in law would like for the patient to go to SNF and patient is agreeable with this plan.  Bed search started.   Patient gives [facility] team permission to speak with sister in law [place].   Patient is currently on airborne precautions.    Expected Discharge Plan: Skilled Nursing Facility Barriers to Discharge: Continued Medical Work up   Patient Goals and CMS Choice   CMS Medicare.gov Compare Post Acute Care list provided to:: Patient Represenative (must comment) Choice offered to / list presented to : Sibling  Expected Discharge Plan and Services Expected Discharge Plan: [place] In-house Referral: Clinical Social Work Discharge Planning Services: CM Consult Post Acute Care Choice: [place] Living arrangements for the past 2 months: Apartment                                      Prior Living Arrangements/Services Living arrangements for the past 2 months: Apartment Lives with:: Friends Patient language and need for interpreter reviewed:: Yes Do you feel safe going back to the place where you live?: Yes      Need for Family Participation in Patient Care: Yes (Comment) Care giver support system in place?: Yes (comment)   Criminal Activity/Legal Involvement Pertinent to Current Situation/Hospitalization: No - Comment as  needed  Activities of Daily Living Home Assistive Devices/Equipment: Wheelchair ADL Screening (condition at time of admission) Patient's cognitive ability adequate to safely complete daily activities?: Yes Is the patient deaf or have difficulty hearing?: No Does the patient have difficulty seeing, even when wearing glasses/contacts?: No Does the patient have difficulty concentrating, remembering, or making decisions?: No Patient able to express need for assistance with ADLs?: Yes Does the patient have difficulty dressing or bathing?: Yes Independently performs ADLs?: Yes (appropriate for developmental age)(but sometimes with assistance) Does the patient have difficulty walking or climbing stairs?: Yes Weakness of Legs: Both Weakness of Arms/Hands: Both  Permission Sought/Granted Permission sought to share information with : Case Manager Permission granted to share information with : Yes, Verbal Permission Granted     Permission granted to share info w AGENCY: SNF  Permission granted to share info w Relationship: [name]- sister     Emotional Assessment   Attitude/Demeanor/Rapport: Engaged Affect (typically observed): Accepting Orientation: : Oriented to Self, Oriented to Place, Oriented to  Time, Oriented to Situation Alcohol / Substance Use: Not Applicable Psych Involvement: No (comment)  Admission diagnosis:  Dehydration [E86.0] Breast mass [N63.0] Hip pain [M25.559] Renal insufficiency [N28.9] Urinary tract infection with hematuria, site unspecified [N39.0, R31.9] Patient Active Problem List   Diagnosis Date Noted  . Cavitary lung disease   . Hypotension   . Hyponatremia 08/21/2019  .  Acute lower UTI 08/21/2019  . AKI (acute kidney injury) ([place]) 08/21/2019  . Severe malnutrition ([place]) 03/31/2018  . Hypokalemia 03/29/2018  . Diabetes mellitus type 2, with complication, on long term insulin pump ([place])   . Nausea & vomiting   . Chronic indwelling Foley catheter   .  Bladder problem   . Rotator cuff tear arthropathy, left   . Malnutrition of moderate degree 01/24/2018  . Pressure injury of skin 12/22/2017  . Hepatic steatosis 05/15/2013  . Abdominal pain, right upper quadrant 05/07/2013  . Breast cancer screening 11/14/2011  . Hypertriglyceridemia 10/30/2011  . Depression 10/26/2011  . Diabetes mellitus type 2, uncontrolled ([place]) 10/26/2011  . Hypertension 10/26/2011   PCP:  Patient, No Pcp Per Pharmacy:   [facility] [address] [place] [address] Phone: [phone] Fax: [phone]     Social Determinants of Health (SDOH) Interventions    Readmission Risk Interventions No flowsheet data found.

## 2019-08-26 NOTE — Progress Notes (Signed)
Patient ID: Julia Shea, female   DOB: 1956-01-24, 63 y.o.   MRN: [id number] [facility] PROGRESS NOTE  Brietta Kellar [id number] DOB: 1956/08/31 DOA: 08/21/2019 PCP: Patient, No Pcp Per  HPI/Subjective: Patient feels okay.  Some cough.  Not much production.  Feels weak.  Objective: Vitals:   08/26/19 0418 08/26/19 0806  BP: 104/69 103/68  Pulse: 77 76  Resp: 16 17  Temp: (!) 97.4 F (36.3 C) (!) 97.2 F (36.2 C)  SpO2: 100% 100%    Intake/Output Summary (Last 24 hours) at 08/26/2019 1304 Last data filed at 08/25/2019 1840 Gross per 24 hour  Intake -  Output 450 ml  Net -450 ml   Filed Weights   08/21/19 1317 08/22/19 0300  Weight: 41.7 kg 52.7 kg    ROS: Review of Systems  Constitutional: Negative for chills and fever.  Eyes: Negative for blurred vision.  Respiratory: Positive for cough and shortness of breath.   Cardiovascular: Negative for chest pain.  Gastrointestinal: Negative for abdominal pain, constipation, diarrhea, nausea and vomiting.  Genitourinary: Negative for dysuria.  Musculoskeletal: Negative for joint pain.  Neurological: Negative for dizziness and headaches.   Exam: Physical Exam  Constitutional: She is oriented to person, place, and time.  HENT:  Nose: No mucosal edema.  Mouth/Throat: No oropharyngeal exudate or posterior oropharyngeal edema.  Eyes: Pupils are equal, round, and reactive to light. Conjunctivae, EOM and lids are normal.  Neck: No JVD present. Carotid bruit is not present. No edema present. No thyroid mass and no thyromegaly present.  Cardiovascular: S1 normal and S2 normal. Exam reveals no gallop.  No murmur heard. Pulses:      Dorsalis pedis pulses are 2+ on the right side and 2+ on the left side.  Respiratory: No respiratory distress. She has decreased breath sounds in the right lower field and the left lower field. She has no wheezes. She has no rhonchi. She has no rales.  GI: Soft. Bowel sounds are normal. There  is no abdominal tenderness.  Musculoskeletal:     Right ankle: She exhibits no swelling.     Left ankle: She exhibits no swelling.  Lymphadenopathy:    She has no cervical adenopathy.  Neurological: She is alert and oriented to person, place, and time. No cranial nerve deficit.  Skin: Skin is warm. Nails show no clubbing.  Stage 2 decubiti on right buttuck Numerous scabs lower extremities and upper extremities  Psychiatric: She has a normal mood and affect.      Data Reviewed: Basic Metabolic Panel: Recent Labs  Lab 08/21/19 2313 08/22/19 0813 08/23/19 0421 08/24/19 0413 08/25/19 0408 08/26/19 0322  NA 131* 129* 133* 132* 134* 135  K 3.7 3.3* 4.3 4.1 3.9 3.8  CL 104 103 108 108 106 106  CO2 18* 17* 18* 19* 20* 22  GLUCOSE 112* 143* 130* 209* 106* 130*  BUN 50* 43* 31* 27* 22 22  CREATININE 1.27* 1.10* 0.96 0.79 0.83 0.73  CALCIUM 7.7* 7.3* 8.1* 7.9* 7.8* 7.8*  MG 1.6*  --   --  2.0  --   --    Liver Function Tests: Recent Labs  Lab 08/21/19 1327  AST 10*  ALT 13  ALKPHOS 171*  BILITOT 0.5  PROT 7.6  ALBUMIN 2.7*   Recent Labs  Lab 08/21/19 1327  LIPASE 20   CBC: Recent Labs  Lab 08/21/19 2313 08/22/19 0813 08/23/19 0421 08/24/19 0413 08/25/19 0408 08/26/19 0322  WBC 17.0* 17.4* 14.2* 14.1* 11.7* 11.4*  NEUTROABS  14.3*  --   --   --   --   --   HGB 7.4* 7.4* 7.8* 7.2* 7.1* 7.5*  HCT 21.6* 22.0* 22.7* 21.5* 21.8* 22.7*  MCV 73.7* 76.4* 74.4* 74.7* 77.3* 74.9*  PLT 335 366 379 357 364 370   Cardiac Enzymes: Recent Labs  Lab 08/21/19 1542  CKTOTAL 138    CBG: Recent Labs  Lab 08/25/19 1214 08/25/19 1724 08/25/19 2102 08/26/19 0808 08/26/19 1139  GLUCAP 158* 240* 166* 109* 184*    Recent Results (from the past 240 hour(s))  Urine culture     Status: Abnormal   Collection Time: 08/21/19  1:27 PM   Specimen: Urine, Random  Result Value Ref Range Status   Specimen Description   Final    URINE, RANDOM Performed at [facility], [address] [place], [place] [address]    Special Requests   Final    NONE Performed at [facility], [address] [place], [place] [address]    Culture (A)  Final    >=100,000 COLONIES/mL LACTOBACILLUS SPECIES Standardized susceptibility testing for this organism is not available. Performed at [facility], [place] [address] [place], [place] [address]    Report Status 08/23/2019 FINAL  Final  SARS CORONAVIRUS 2 (TAT 6-24 HRS) Nasopharyngeal Nasopharyngeal Swab     Status: None   Collection Time: 08/21/19  3:42 PM   Specimen: Nasopharyngeal Swab  Result Value Ref Range Status   SARS Coronavirus 2 NEGATIVE NEGATIVE Final    Comment: (NOTE) SARS-CoV-2 target nucleic acids are NOT DETECTED. The SARS-CoV-2 RNA is generally detectable in upper and lower respiratory specimens during the acute phase of infection. Negative results do not preclude SARS-CoV-2 infection, do not rule out co-infections with other pathogens, and should not be used as the sole basis for treatment or other patient management decisions. Negative results must be combined with clinical observations, patient history, and epidemiological information. The expected result is Negative. Fact Sheet for Patients: SugarRoll.be Fact Sheet for Healthcare Providers: https://www.woods-mathews.com/ This test is not yet approved or cleared by the [place] FDA and  has been authorized for detection and/or diagnosis of SARS-CoV-2 by FDA under an Emergency Use Authorization (EUA). This EUA will remain  in effect (meaning this test can be used) for the duration of the COVID-19 declaration under Section 56 4(b)(1) of the Act, 21 U.S.C. section 360bbb-3(b)(1), unless the authorization is terminated or revoked sooner. Performed at [facility], [place] [address] [place], [place] [address]   Blood culture (routine x 2)     Status: None   Collection Time:  08/21/19  3:42 PM   Specimen: BLOOD  Result Value Ref Range Status   Specimen Description BLOOD RIGHT ANTECUBITAL  Final   Special Requests   Final    BOTTLES DRAWN AEROBIC AND ANAEROBIC Blood Culture adequate volume   Culture   Final    NO GROWTH 5 DAYS Performed at [facility], [address], [place], [place] [address]    Report Status 08/26/2019 FINAL  Final  Blood culture (routine x 2)     Status: None   Collection Time: 08/21/19  3:42 PM   Specimen: BLOOD  Result Value Ref Range Status   Specimen Description BLOOD BLOOD RIGHT FOREARM  Final   Special Requests   Final    BOTTLES DRAWN AEROBIC AND ANAEROBIC Blood Culture adequate volume   Culture   Final    NO GROWTH 5  DAYS Performed at [facility], [address] [place], [place] [address]    Report Status 08/26/2019 FINAL  Final  MRSA PCR Screening     Status: Abnormal   Collection Time: 08/22/19 11:41 AM  Result Value Ref Range Status   MRSA by PCR POSITIVE (A) NEGATIVE Final    Comment:        The GeneXpert MRSA Assay (FDA approved for NASAL specimens only), is one component of a comprehensive MRSA colonization surveillance program. It is not intended to diagnose MRSA infection nor to guide or monitor treatment for MRSA infections. RESULT CALLED TO, READ BACK BY AND VERIFIED WITH: JANE HODGE ON 08/22/2019 AT 1345 TIK Performed at [facility], [address] [place], [place] [address]   Expectorated sputum assessment w rflx to resp cult     Status: None   Collection Time: 08/23/19 10:00 AM   Specimen: Sputum  Result Value Ref Range Status   Specimen Description SPUTUM  Final   Special Requests NONE  Final   Sputum evaluation   Final    Sputum specimen not acceptable for testing.  Please recollect.   C/CHELSEA KNIGHT AT [id number] 08/23/2019.PMF Performed at [facility], [address] [place], [place] [address]    Report Status 08/23/2019 FINAL  Final  Culture,  expectorated sputum-assessment     Status: None   Collection Time: 08/24/19 11:25 AM   Specimen: Expectorated Sputum  Result Value Ref Range Status   Specimen Description EXPECTORATED SPUTUM  Final   Special Requests NONE  Final   Sputum evaluation   Final    Sputum specimen not acceptable for testing.  Please recollect.   CALLED TO ASHLEY RAMIREZ @1846  08/24/19 MJU Performed at [facility], [address] [place], [place] [address]    Report Status 08/24/2019 FINAL  Final      Scheduled Meds: . Chlorhexidine Gluconate Cloth  6 each Topical Q0600  . enoxaparin (LOVENOX) injection  40 mg Subcutaneous Q24H  . ferrous sulfate  325 mg Oral Q breakfast  . folic acid  1 mg Oral Daily  . insulin aspart  0-9 Units Subcutaneous TID WC  . insulin glargine  10 Units Subcutaneous QHS  . mupirocin ointment  1 application Nasal BID  . pantoprazole  40 mg Oral Daily  . Ensure Max Protein  11 oz Oral BID BM  . tuberculin  5 Units Intradermal Once   Continuous Infusions: . sodium chloride Stopped (08/24/19 1759)  . ceFEPime (MAXIPIME) IV 2 g (08/26/19 1012)  . vancomycin Stopped (08/25/19 2341)    Assessment/Plan:  1. Cavitary lesions and lung nodules.  Radiologist suspected infectious inflammatory etiology.  Continue cefepime and vancomycin.  Appreciate infectious disease consultation.  We will get a PPD since QuantiFERON gold indeterminate.  So far sputum's are not a good sample.  Spoke with nurse to speak with respiratory about inducing sputum's for AFB.  I ordered ANA, ANCA, rheumatoid factor, anti-GBM antibody 2. Hypotension.  Improved with IV fluids 3. Hyponatremia.  This has improved with IV fluids 4. Acute kidney injury.  Improved with IV fluids 5. Anemia of chronic disease 6. Type 2 diabetes mellitus uncontrolled with acute kidney injury.  hemoglobin A1c elevated at greater than 14.  On low-dose Lantus. 7. Severe malnutrition 8. Stage II decubitus ulcer on right  buttock 9. Lactobacillus and urine culture.  Antibiotic should cover this also.  Code Status:     Code Status Orders  (From admission, onward)  Start     Ordered   08/21/19 1940  Full code  Continuous     08/21/19 1939        Code Status History    Date Active Date Inactive Code Status Order ID Comments User Context   03/29/2018 2042 03/31/2018 1846 Full Code [id number]  Saundra Shelling, MD Inpatient   01/23/2018 2104 01/28/2018 2143 Full Code [id number]  Saundra Shelling, MD Inpatient   12/21/2017 1750 12/23/2017 2222 Full Code [id number]  Saundra Shelling, MD Inpatient   Advance Care Planning Activity    Advance Directive Documentation     Most Recent Value  Type of Advance Directive  Healthcare Power of Attorney  Pre-existing out of facility DNR order (yellow form or pink MOST form)  -  "MOST" Form in Place?  -     Family Communication: Spoke with brother on the phone Disposition Plan: To be determined  Consultants:  Infectious disease  Pulmonary  Antibiotics:  Vancomycin  Cefepime  Time spent: 28 minutes  [place]

## 2019-08-27 ENCOUNTER — Inpatient Hospital Stay: Payer: Medicare Other

## 2019-08-27 DIAGNOSIS — R1032 Left lower quadrant pain: Secondary | ICD-10-CM

## 2019-08-27 LAB — ANCA TITERS
Atypical P-ANCA titer: <1:20 {titer}
C-ANCA: <1:20 {titer}
P-ANCA: <1:20 {titer}

## 2019-08-27 LAB — GLUCOSE, CAPILLARY
Glucose-Capillary: 112 mg/dL — ABNORMAL HIGH (ref 70–99)
Glucose-Capillary: 202 mg/dL — ABNORMAL HIGH (ref 70–99)
Glucose-Capillary: 253 mg/dL — ABNORMAL HIGH (ref 70–99)
Glucose-Capillary: 221 mg/dL — ABNORMAL HIGH (ref 70–99)

## 2019-08-27 LAB — GLOMERULAR BASEMENT MEMBRANE ANTIBODIES: GBM Ab: 3 units (ref 0–20)

## 2019-08-27 LAB — RHEUMATOID FACTOR: Rheumatoid fact SerPl-aCnc: 13.7 IU/mL (ref 0.0–13.9)

## 2019-08-27 NOTE — Progress Notes (Signed)
Patient ID: Lailey Zurlo, female   DOB: 07-26-1956, 63 y.o.   MRN: [id number] [facility] PROGRESS NOTE  Maebel Tegethoff [id number] DOB: 11/30/1955 DOA: 08/21/2019 PCP: Patient, No Pcp Per  HPI/Subjective: Patient complains of pain in the left groin.  She is able to lift her leg up but has severe pain with doing it.  Slight cough and slight shortness of breath.  Objective: Vitals:   08/27/19 0809 08/27/19 0831  BP:  97/60  Pulse: 85 78  Resp: 18 17  Temp:  (!) 97.5 F (36.4 C)  SpO2: 99% 100%   No intake or output data in the 24 hours ending 08/27/19 1106 Filed Weights   08/21/19 1317 08/22/19 0300  Weight: 41.7 kg 52.7 kg    ROS: Review of Systems  Constitutional: Negative for chills and fever.  Eyes: Negative for blurred vision.  Respiratory: Positive for cough and shortness of breath.   Cardiovascular: Negative for chest pain.  Gastrointestinal: Negative for abdominal pain, constipation, diarrhea, nausea and vomiting.  Genitourinary: Negative for dysuria.  Musculoskeletal: Positive for joint pain.  Neurological: Negative for dizziness and headaches.   Exam: Physical Exam  Constitutional: She is oriented to person, place, and time.  HENT:  Nose: No mucosal edema.  Mouth/Throat: No oropharyngeal exudate or posterior oropharyngeal edema.  Eyes: Pupils are equal, round, and reactive to light. Conjunctivae, EOM and lids are normal.  Neck: No JVD present. Carotid bruit is not present. No edema present. No thyroid mass and no thyromegaly present.  Cardiovascular: S1 normal and S2 normal. Exam reveals no gallop.  No murmur heard. Pulses:      Dorsalis pedis pulses are 2+ on the right side and 2+ on the left side.  Respiratory: No respiratory distress. She has decreased breath sounds in the right lower field and the left lower field. She has no wheezes. She has no rhonchi. She has no rales.  GI: Soft. Bowel sounds are normal. There is no abdominal tenderness.   Musculoskeletal:     Right ankle: She exhibits no swelling.     Left ankle: She exhibits no swelling.  Lymphadenopathy:    She has no cervical adenopathy.  Neurological: She is alert and oriented to person, place, and time. No cranial nerve deficit.  Skin: Skin is warm. Nails show no clubbing.  Stage 2 decubiti on right buttuck Numerous scabs lower extremities and upper extremities  Psychiatric: She has a normal mood and affect.      Data Reviewed: Basic Metabolic Panel: Recent Labs  Lab 08/21/19 2313 08/22/19 0813 08/23/19 0421 08/24/19 0413 08/25/19 0408 08/26/19 0322  NA 131* 129* 133* 132* 134* 135  K 3.7 3.3* 4.3 4.1 3.9 3.8  CL 104 103 108 108 106 106  CO2 18* 17* 18* 19* 20* 22  GLUCOSE 112* 143* 130* 209* 106* 130*  BUN 50* 43* 31* 27* 22 22  CREATININE 1.27* 1.10* 0.96 0.79 0.83 0.73  CALCIUM 7.7* 7.3* 8.1* 7.9* 7.8* 7.8*  MG 1.6*  --   --  2.0  --   --    Liver Function Tests: Recent Labs  Lab 08/21/19 1327  AST 10*  ALT 13  ALKPHOS 171*  BILITOT 0.5  PROT 7.6  ALBUMIN 2.7*   Recent Labs  Lab 08/21/19 1327  LIPASE 20   CBC: Recent Labs  Lab 08/21/19 2313 08/22/19 0813 08/23/19 0421 08/24/19 0413 08/25/19 0408 08/26/19 0322  WBC 17.0* 17.4* 14.2* 14.1* 11.7* 11.4*  NEUTROABS 14.3*  --   --   --   --   --  HGB 7.4* 7.4* 7.8* 7.2* 7.1* 7.5*  HCT 21.6* 22.0* 22.7* 21.5* 21.8* 22.7*  MCV 73.7* 76.4* 74.4* 74.7* 77.3* 74.9*  PLT 335 366 379 357 364 370   Cardiac Enzymes: Recent Labs  Lab 08/21/19 1542  CKTOTAL 138    CBG: Recent Labs  Lab 08/26/19 0808 08/26/19 1139 08/26/19 1710 08/26/19 2113 08/27/19 0832  GLUCAP 109* 184* 264* 198* 112*    Recent Results (from the past 240 hour(s))  Urine culture     Status: Abnormal   Collection Time: 08/21/19  1:27 PM   Specimen: Urine, Random  Result Value Ref Range Status   Specimen Description   Final    URINE, RANDOM Performed at [facility], [address],  [place], [place] [address]    Special Requests   Final    NONE Performed at [facility], [address] [place], [place] [address]    Culture (A)  Final    >=100,000 COLONIES/mL LACTOBACILLUS SPECIES Standardized susceptibility testing for this organism is not available. Performed at [facility], [place] [address], [place], [place] [address]    Report Status 08/23/2019 FINAL  Final  SARS CORONAVIRUS 2 (TAT 6-24 HRS) Nasopharyngeal Nasopharyngeal Swab     Status: None   Collection Time: 08/21/19  3:42 PM   Specimen: Nasopharyngeal Swab  Result Value Ref Range Status   SARS Coronavirus 2 NEGATIVE NEGATIVE Final    Comment: (NOTE) SARS-CoV-2 target nucleic acids are NOT DETECTED. The SARS-CoV-2 RNA is generally detectable in upper and lower respiratory specimens during the acute phase of infection. Negative results do not preclude SARS-CoV-2 infection, do not rule out co-infections with other pathogens, and should not be used as the sole basis for treatment or other patient management decisions. Negative results must be combined with clinical observations, patient history, and epidemiological information. The expected result is Negative. Fact Sheet for Patients: SugarRoll.be Fact Sheet for Healthcare Providers: https://www.woods-mathews.com/ This test is not yet approved or cleared by the [place] FDA and  has been authorized for detection and/or diagnosis of SARS-CoV-2 by FDA under an Emergency Use Authorization (EUA). This EUA will remain  in effect (meaning this test can be used) for the duration of the COVID-19 declaration under Section 56 4(b)(1) of the Act, 21 U.S.C. section 360bbb-3(b)(1), unless the authorization is terminated or revoked sooner. Performed at [facility], [place] [address], [place], [place] [address]   Blood culture (routine x 2)     Status: None   Collection Time: 08/21/19  3:42 PM   Specimen:  BLOOD  Result Value Ref Range Status   Specimen Description BLOOD RIGHT ANTECUBITAL  Final   Special Requests   Final    BOTTLES DRAWN AEROBIC AND ANAEROBIC Blood Culture adequate volume   Culture   Final    NO GROWTH 5 DAYS Performed at [facility], [address] [place], [place] [address]    Report Status 08/26/2019 FINAL  Final  Blood culture (routine x 2)     Status: None   Collection Time: 08/21/19  3:42 PM   Specimen: BLOOD  Result Value Ref Range Status   Specimen Description BLOOD BLOOD RIGHT FOREARM  Final   Special Requests   Final    BOTTLES DRAWN AEROBIC AND ANAEROBIC Blood Culture adequate volume   Culture   Final    NO GROWTH 5 DAYS Performed at [facility], [address] [place],  [address]    Report  Status 08/26/2019 FINAL  Final  MRSA PCR Screening     Status: Abnormal   Collection Time: 08/22/19 11:41 AM  Result Value Ref Range Status   MRSA by PCR POSITIVE (A) NEGATIVE Final    Comment:        The GeneXpert MRSA Assay (FDA approved for NASAL specimens only), is one component of a comprehensive MRSA colonization surveillance program. It is not intended to diagnose MRSA infection nor to guide or monitor treatment for MRSA infections. RESULT CALLED TO, READ BACK BY AND VERIFIED WITH: JANE HODGE ON 08/22/2019 AT 1345 TIK Performed at [facility], [address] [place], [place] [address]   Expectorated sputum assessment w rflx to resp cult     Status: None   Collection Time: 08/23/19 10:00 AM   Specimen: Sputum  Result Value Ref Range Status   Specimen Description SPUTUM  Final   Special Requests NONE  Final   Sputum evaluation   Final    Sputum specimen not acceptable for testing.  Please recollect.   C/CHELSEA KNIGHT AT [id number] 08/23/2019.PMF Performed at [facility], [address] [place], [place] [address]    Report Status 08/23/2019 FINAL  Final  Culture, expectorated sputum-assessment      Status: None   Collection Time: 08/24/19 11:25 AM   Specimen: Expectorated Sputum  Result Value Ref Range Status   Specimen Description EXPECTORATED SPUTUM  Final   Special Requests NONE  Final   Sputum evaluation   Final    Sputum specimen not acceptable for testing.  Please recollect.   CALLED TO ASHLEY RAMIREZ @1846  08/24/19 MJU Performed at [facility], [address] [place], [place] [address]    Report Status 08/24/2019 FINAL  Final      Scheduled Meds: . acetylcysteine  4 mL Nebulization BID  . Chlorhexidine Gluconate Cloth  6 each Topical Q0600  . enoxaparin (LOVENOX) injection  40 mg Subcutaneous Q24H  . ferrous sulfate  325 mg Oral Q breakfast  . folic acid  1 mg Oral Daily  . insulin aspart  0-9 Units Subcutaneous TID WC  . insulin glargine  10 Units Subcutaneous QHS  . linezolid  600 mg Oral Q12H  . mupirocin ointment  1 application Nasal BID  . pantoprazole  40 mg Oral Daily  . Ensure Max Protein  11 oz Oral BID BM  . tuberculin  5 Units Intradermal Once   Continuous Infusions: . sodium chloride Stopped (08/24/19 1759)    Assessment/Plan:  1. Cavitary lesions and lung nodules.  Radiologist suspected infectious inflammatory etiology.  Continue cefepime and vancomycin.  Appreciate infectious disease consultation.  PPD placed yesterday QuantiFERON gold indeterminate. I ordered ANA, ANCA.both rheumatoid factor and anti-GBM antibody are in the normal range.  Still trying to induce sputum for AFB. 2. Left groin pain.  We will get an x-ray of the pelvis.  Since she is able to move her left leg doubt this is hip injury. 3. Hypotension.  Improved with IV fluids 4. Hyponatremia.  This has improved with IV fluids 5. Acute kidney injury.  Improved with IV fluids 6. Anemia of chronic disease 7. Type 2 diabetes mellitus uncontrolled with acute kidney injury.  hemoglobin A1c elevated at greater than 14.  On low-dose Lantus. 8. Severe malnutrition 9. Stage II  decubitus ulcer on right buttock 10. Lactobacillus and urine culture.  Antibiotic should cover this also.  Code Status:     Code Status Orders  (From admission, onward)  Start     Ordered   08/21/19 1940  Full code  Continuous     08/21/19 1939        Code Status History    Date Active Date Inactive Code Status Order ID Comments User Context   03/29/2018 2042 03/31/2018 1846 Full Code [id number]  Saundra Shelling, MD Inpatient   01/23/2018 2104 01/28/2018 2143 Full Code [id number]  Saundra Shelling, MD Inpatient   12/21/2017 1750 12/23/2017 2222 Full Code [id number]  Saundra Shelling, MD Inpatient   Advance Care Planning Activity    Advance Directive Documentation     Most Recent Value  Type of Advance Directive  Healthcare Power of Attorney  Pre-existing out of facility DNR order (yellow form or pink MOST form)  -  "MOST" Form in Place?  -     Family Communication: Spoke with brother on the phone yesterday Disposition Plan: To be determined  Consultants:  Infectious disease  Pulmonary  Antibiotics:  Vancomycin  Cefepime  Time spent: 27 minutes  [place]

## 2019-08-27 NOTE — Progress Notes (Signed)
Patient c/o pain in Left extremity , redness noted to the left lateral side. Dr. Earleen Newport doing rounds and ordered DG for unilateral hip d/t groin pain .

## 2019-08-27 NOTE — Progress Notes (Signed)
Patient has 3 cups beside of bed, per order of 23rd will monitor for productive cough , at this time has nonproductive cough noted. Educated patient of when she feels need to expectorate it must be a copious amount

## 2019-08-28 ENCOUNTER — Inpatient Hospital Stay: Payer: Medicare Other

## 2019-08-28 DIAGNOSIS — R58 Hemorrhage, not elsewhere classified: Secondary | ICD-10-CM

## 2019-08-28 DIAGNOSIS — R454 Irritability and anger: Secondary | ICD-10-CM

## 2019-08-28 DIAGNOSIS — R1032 Left lower quadrant pain: Secondary | ICD-10-CM

## 2019-08-28 DIAGNOSIS — D638 Anemia in other chronic diseases classified elsewhere: Secondary | ICD-10-CM

## 2019-08-28 LAB — CBC WITH DIFFERENTIAL/PLATELET
Abs Immature Granulocytes: 0.05 10*3/uL (ref 0.00–0.07)
Basophils Absolute: 0 10*3/uL (ref 0.0–0.1)
Basophils Relative: 0 %
Eosinophils Absolute: 0.2 10*3/uL (ref 0.0–0.5)
Eosinophils Relative: 3 %
HCT: 20.4 % — ABNORMAL LOW (ref 36.0–46.0)
Hemoglobin: 6.7 g/dL — ABNORMAL LOW (ref 12.0–15.0)
Immature Granulocytes: 1 %
Lymphocytes Relative: 16 %
Lymphs Abs: 1.5 10*3/uL (ref 0.7–4.0)
MCH: 24.5 pg — ABNORMAL LOW (ref 26.0–34.0)
MCHC: 32.8 g/dL (ref 30.0–36.0)
MCV: 74.7 fL — ABNORMAL LOW (ref 80.0–100.0)
Monocytes Absolute: 0.5 10*3/uL (ref 0.1–1.0)
Monocytes Relative: 6 %
Neutro Abs: 6.9 10*3/uL (ref 1.7–7.7)
Neutrophils Relative %: 74 %
Platelets: 306 10*3/uL (ref 150–400)
RBC: 2.73 MIL/uL — ABNORMAL LOW (ref 3.87–5.11)
RDW: 14.6 % (ref 11.5–15.5)
WBC: 9.2 10*3/uL (ref 4.0–10.5)
nRBC: 0 % (ref 0.0–0.2)

## 2019-08-28 LAB — ENA+DNA/DS+ANTICH+CENTRO+JO...
Anti JO-1: <0.2 AI (ref 0.0–0.9)
Centromere Ab Screen: <0.2 AI (ref 0.0–0.9)
Chromatin Ab SerPl-aCnc: <0.2 AI (ref 0.0–0.9)
ENA SM Ab Ser-aCnc: 1.4 AI — ABNORMAL HIGH (ref 0.0–0.9)
Ribonucleic Protein: 1.1 AI — ABNORMAL HIGH (ref 0.0–0.9)
SSA (Ro) (ENA) Antibody, IgG: <0.2 AI (ref 0.0–0.9)
SSB (La) (ENA) Antibody, IgG: <0.2 AI (ref 0.0–0.9)
Scleroderma (Scl-70) (ENA) Antibody, IgG: <0.2 AI (ref 0.0–0.9)
ds DNA Ab: <1 IU/mL (ref 0–9)

## 2019-08-28 LAB — C-REACTIVE PROTEIN: CRP: 10 mg/dL — ABNORMAL HIGH (ref <1.0)

## 2019-08-28 LAB — GLUCOSE, CAPILLARY
Glucose-Capillary: 154 mg/dL — ABNORMAL HIGH (ref 70–99)
Glucose-Capillary: 167 mg/dL — ABNORMAL HIGH (ref 70–99)
Glucose-Capillary: 146 mg/dL — ABNORMAL HIGH (ref 70–99)
Glucose-Capillary: 97 mg/dL (ref 70–99)
Glucose-Capillary: 108 mg/dL — ABNORMAL HIGH (ref 70–99)

## 2019-08-28 LAB — FUNGITELL, SERUM: Fungitell Result: 116 pg/mL — ABNORMAL HIGH (ref <80)

## 2019-08-28 LAB — SEDIMENTATION RATE: Sed Rate: 126 mm/hr — ABNORMAL HIGH (ref 0–30)

## 2019-08-28 LAB — ANA W/REFLEX IF POSITIVE: Anti Nuclear Antibody (ANA): POSITIVE — AB

## 2019-08-28 MED ORDER — SODIUM CHLORIDE 0.9 % IV BOLUS
500.0000 mL | Freq: Once | INTRAVENOUS | Status: AC
  Administered 2019-08-28: 500 mL via INTRAVENOUS

## 2019-08-28 MED ORDER — ALBUTEROL SULFATE (2.5 MG/3ML) 0.083% IN NEBU
2.5000 mg | INHALATION_SOLUTION | Freq: Four times a day (QID) | RESPIRATORY_TRACT | Status: DC | PRN
  Administered 2019-08-28: 2.5 mg via RESPIRATORY_TRACT

## 2019-08-28 MED ORDER — SODIUM CHLORIDE 0.9 % IV SOLN
2.0000 g | INTRAVENOUS | Status: DC
  Administered 2019-08-28 – 2019-09-09 (×13): 2 g via INTRAVENOUS
  Filled 2019-08-28: qty 2
  Filled 2019-08-28: qty 20
  Filled 2019-08-28: qty 2
  Filled 2019-08-28 (×2): qty 20
  Filled 2019-08-28 (×4): qty 2
  Filled 2019-08-28: qty 20
  Filled 2019-08-28 (×6): qty 2

## 2019-08-28 MED ORDER — MIDODRINE HCL 5 MG PO TABS
5.0000 mg | ORAL_TABLET | Freq: Three times a day (TID) | ORAL | Status: DC
  Administered 2019-08-28 – 2019-09-09 (×28): 5 mg via ORAL
  Filled 2019-08-28 (×29): qty 1

## 2019-08-28 MED ORDER — METRONIDAZOLE IN NACL 5-0.79 MG/ML-% IV SOLN
500.0000 mg | Freq: Three times a day (TID) | INTRAVENOUS | Status: DC
  Administered 2019-08-28 – 2019-09-09 (×36): 500 mg via INTRAVENOUS
  Filled 2019-08-28 (×41): qty 100

## 2019-08-28 NOTE — Progress Notes (Signed)
Patient refused in and out cath. Purewick intact. Will continue to monitor so can collect urine

## 2019-08-28 NOTE — TOC Progression Note (Signed)
Transition of Care [facility] - Progression Note    Patient Details  Name: Julia Shea MRN: [id number] Date of Birth: [birth year]  Transition of Care [facility]) CM/SW [place], RN Phone Number: 08/28/2019, 11:01 AM  Clinical Narrative:     Requested the price of Zyvox 600 mg BID for 4-6 weeks Expected Discharge Plan: [place] Barriers to Discharge: Continued Medical Work up  Expected Discharge Plan and Services Expected Discharge Plan: [place] In-house Referral: Clinical Social Work Discharge Planning Services: CM Consult Post Acute Care Choice: [place] Living arrangements for the past 2 months: Apartment                                       Social Determinants of Health (SDOH) Interventions    Readmission Risk Interventions No flowsheet data found.

## 2019-08-28 NOTE — Progress Notes (Signed)
Patient presents with failure to thrive. Incontinent B and B , sleeps and when try to feed states " I am not hungry right now". Encouraged PO fluids , IV ABT hanging at this time. BP much improved. Tolerates PO meds without difficulty .

## 2019-08-28 NOTE — Progress Notes (Signed)
Patient noted to have BP of 72/50. Pulse 59.Marland KitchenRecieved Orders per Dr. Earleen Newport for Bolus of NS started , Placed in [place]. CBC in the morning, Will continue to monitor . Sleepy but alert to tactile , verbal stimuli

## 2019-08-28 NOTE — Progress Notes (Signed)
Patient ID: Julia Shea, female   DOB: January 10, 1956, 63 y.o.   MRN: [id number] [facility] PROGRESS NOTE  Julia Shea [id number] DOB: [birth year] DOA: 08/21/2019 PCP: Patient, No Pcp Per  HPI/Subjective: Patient still having some pain in the left groin. Still very weak. She states her appetite is getting better. Just called with her blood pressure being low again.  Objective: Vitals:   08/28/19 0826 08/28/19 1046  BP:  (!) 72/50  Pulse:  79  Resp:    Temp:  (!) 97.5 F (36.4 C)  SpO2: 100% 100%    Intake/Output Summary (Last 24 hours) at 08/28/2019 1052 Last data filed at 08/28/2019 1000 Gross per 24 hour  Intake -  Output 750 ml  Net -750 ml   Filed Weights   08/21/19 1317 08/22/19 0300  Weight: 41.7 kg 52.7 kg    ROS: Review of Systems  Constitutional: Negative for chills and fever.  Eyes: Negative for blurred vision.  Respiratory: Positive for shortness of breath. Negative for cough.   Cardiovascular: Negative for chest pain.  Gastrointestinal: Negative for abdominal pain, constipation, diarrhea, nausea and vomiting.  Genitourinary: Negative for dysuria.  Musculoskeletal: Positive for joint pain.  Neurological: Negative for dizziness and headaches.   Exam: Physical Exam  Constitutional: She is oriented to person, place, and time.  HENT:  Nose: No mucosal edema.  Mouth/Throat: No oropharyngeal exudate or posterior oropharyngeal edema.  Eyes: Pupils are equal, round, and reactive to light. Conjunctivae, EOM and lids are normal.  Neck: No JVD present. Carotid bruit is not present. No edema present. No thyroid mass and no thyromegaly present.  Cardiovascular: S1 normal and S2 normal. Exam reveals no gallop.  No murmur heard. Pulses:      Dorsalis pedis pulses are 2+ on the right side and 2+ on the left side.  Respiratory: No respiratory distress. She has decreased breath sounds in the right lower field and the left lower field. She has no wheezes. She has  no rhonchi. She has no rales.  GI: Soft. Bowel sounds are normal. There is no abdominal tenderness.  Musculoskeletal:     Right ankle: She exhibits no swelling.     Left ankle: She exhibits no swelling.  Lymphadenopathy:    She has no cervical adenopathy.  Neurological: She is alert and oriented to person, place, and time. No cranial nerve deficit.  Skin: Skin is warm. Nails show no clubbing.  Stage 2 decubiti on right buttuck Numerous scabs lower extremities and upper extremities  Psychiatric: She has a normal mood and affect.      Data Reviewed: Basic Metabolic Panel: Recent Labs  Lab 08/21/19 2313 08/22/19 0813 08/23/19 0421 08/24/19 0413 08/25/19 0408 08/26/19 0322  NA 131* 129* 133* 132* 134* 135  K 3.7 3.3* 4.3 4.1 3.9 3.8  CL 104 103 108 108 106 106  CO2 18* 17* 18* 19* 20* 22  GLUCOSE 112* 143* 130* 209* 106* 130*  BUN 50* 43* 31* 27* 22 22  CREATININE 1.27* 1.10* 0.96 0.79 0.83 0.73  CALCIUM 7.7* 7.3* 8.1* 7.9* 7.8* 7.8*  MG 1.6*  --   --  2.0  --   --    Liver Function Tests: Recent Labs  Lab 08/21/19 1327  AST 10*  ALT 13  ALKPHOS 171*  BILITOT 0.5  PROT 7.6  ALBUMIN 2.7*   Recent Labs  Lab 08/21/19 1327  LIPASE 20   CBC: Recent Labs  Lab 08/21/19 2313 08/22/19 0813 08/23/19 0421 08/24/19 0413 08/25/19  0408 08/26/19 0322  WBC 17.0* 17.4* 14.2* 14.1* 11.7* 11.4*  NEUTROABS 14.3*  --   --   --   --   --   HGB 7.4* 7.4* 7.8* 7.2* 7.1* 7.5*  HCT 21.6* 22.0* 22.7* 21.5* 21.8* 22.7*  MCV 73.7* 76.4* 74.4* 74.7* 77.3* 74.9*  PLT 335 366 379 357 364 370   Cardiac Enzymes: Recent Labs  Lab 08/21/19 1542  CKTOTAL 138    CBG: Recent Labs  Lab 08/27/19 1203 08/27/19 1702 08/27/19 2102 08/28/19 0834 08/28/19 0946  GLUCAP 202* 253* 221* 154* 167*    Recent Results (from the past 240 hour(s))  Urine culture     Status: Abnormal   Collection Time: 08/21/19  1:27 PM   Specimen: Urine, Random  Result Value Ref Range Status   Specimen  Description   Final    URINE, RANDOM Performed at [facility], [address] [place], [place] [address]    Special Requests   Final    NONE Performed at [facility], [address] [place], [place] [address]    Culture (A)  Final    >=100,000 COLONIES/mL LACTOBACILLUS SPECIES Standardized susceptibility testing for this organism is not available. Performed at [facility], [place] [address] [place], [place] [address]    Report Status 08/23/2019 FINAL  Final  SARS CORONAVIRUS 2 (TAT 6-24 HRS) Nasopharyngeal Nasopharyngeal Swab     Status: None   Collection Time: 08/21/19  3:42 PM   Specimen: Nasopharyngeal Swab  Result Value Ref Range Status   SARS Coronavirus 2 NEGATIVE NEGATIVE Final    Comment: (NOTE) SARS-CoV-2 target nucleic acids are NOT DETECTED. The SARS-CoV-2 RNA is generally detectable in upper and lower respiratory specimens during the acute phase of infection. Negative results do not preclude SARS-CoV-2 infection, do not rule out co-infections with other pathogens, and should not be used as the sole basis for treatment or other patient management decisions. Negative results must be combined with clinical observations, patient history, and epidemiological information. The expected result is Negative. Fact Sheet for Patients: SugarRoll.be Fact Sheet for Healthcare Providers: https://www.woods-mathews.com/ This test is not yet approved or cleared by the [place] FDA and  has been authorized for detection and/or diagnosis of SARS-CoV-2 by FDA under an Emergency Use Authorization (EUA). This EUA will remain  in effect (meaning this test can be used) for the duration of the COVID-19 declaration under Section 56 4(b)(1) of the Act, 21 U.S.C. section 360bbb-3(b)(1), unless the authorization is terminated or revoked sooner. Performed at [facility], [place] [address], [place], [place] [address]    Blood culture (routine x 2)     Status: None   Collection Time: 08/21/19  3:42 PM   Specimen: BLOOD  Result Value Ref Range Status   Specimen Description BLOOD RIGHT ANTECUBITAL  Final   Special Requests   Final    BOTTLES DRAWN AEROBIC AND ANAEROBIC Blood Culture adequate volume   Culture   Final    NO GROWTH 5 DAYS Performed at [facility], [address] [place], [place] [address]    Report Status 08/26/2019 FINAL  Final  Blood culture (routine x 2)     Status: None   Collection Time: 08/21/19  3:42 PM   Specimen: BLOOD  Result Value Ref Range Status   Specimen Description BLOOD BLOOD RIGHT FOREARM  Final   Special Requests   Final    BOTTLES DRAWN AEROBIC AND ANAEROBIC Blood Culture adequate  volume   Culture   Final    NO GROWTH 5 DAYS Performed at [facility], [address] [place], [place] [address]    Report Status 08/26/2019 FINAL  Final  MRSA PCR Screening     Status: Abnormal   Collection Time: 08/22/19 11:41 AM  Result Value Ref Range Status   MRSA by PCR POSITIVE (A) NEGATIVE Final    Comment:        The GeneXpert MRSA Assay (FDA approved for NASAL specimens only), is one component of a comprehensive MRSA colonization surveillance program. It is not intended to diagnose MRSA infection nor to guide or monitor treatment for MRSA infections. RESULT CALLED TO, READ BACK BY AND VERIFIED WITH: JANE HODGE ON 08/22/2019 AT 1345 TIK Performed at [facility], [address] [place], [place] [address]   Expectorated sputum assessment w rflx to resp cult     Status: None   Collection Time: 08/23/19 10:00 AM   Specimen: Sputum  Result Value Ref Range Status   Specimen Description SPUTUM  Final   Special Requests NONE  Final   Sputum evaluation   Final    Sputum specimen not acceptable for testing.  Please recollect.   C/CHELSEA KNIGHT AT [id number] 08/23/2019.PMF Performed at [facility], [address] [place],  [place] [address]    Report Status 08/23/2019 FINAL  Final  Culture, expectorated sputum-assessment     Status: None   Collection Time: 08/24/19 11:25 AM   Specimen: Expectorated Sputum  Result Value Ref Range Status   Specimen Description EXPECTORATED SPUTUM  Final   Special Requests NONE  Final   Sputum evaluation   Final    Sputum specimen not acceptable for testing.  Please recollect.   CALLED TO ASHLEY RAMIREZ @1846  08/24/19 MJU Performed at [facility], [address] [place], [place] [address]    Report Status 08/24/2019 FINAL  Final      Scheduled Meds: . enoxaparin (LOVENOX) injection  40 mg Subcutaneous Q24H  . ferrous sulfate  325 mg Oral Q breakfast  . folic acid  1 mg Oral Daily  . insulin aspart  0-9 Units Subcutaneous TID WC  . insulin glargine  10 Units Subcutaneous QHS  . linezolid  600 mg Oral Q12H  . mupirocin ointment  1 application Nasal BID  . pantoprazole  40 mg Oral Daily  . Ensure Max Protein  11 oz Oral BID BM  . tuberculin  5 Units Intradermal Once   Continuous Infusions: . sodium chloride Stopped (08/24/19 1759)  . sodium chloride      Assessment/Plan:  1. Cavitary lesions and lung nodules.  Radiologist suspected infectious inflammatory etiology. Infectious disease doctor changed antibiotics over to oral Zyvox. I will read PPD placed on right forearm tomorrow morning. QuantiFERON gold indeterminate. I ordered ANA. The ANCA, rheumatoid factor and anti-GBM antibody are in the normal range. We are unable to induce sputum for AFB. Case discussed with infectious disease physician and recommended reconsulting pulmonary for consideration of bronchoscopy. I message Dr. Devona Konig. 2. Hypotension. We will give fluid bolus today. I may end up having to start low-dose midodrine. 3. Left groin pain. X-ray of the pelvis negative for pubic rami fracture. Continue physical therapy. Likely musculoskeletal in nature. 4. Hyponatremia.  This has improved with IV  fluids 5. Acute kidney injury.  Improved with IV fluids 6. Anemia of chronic disease. Check hemoglobin tomorrow morning with type and cross. May end up needing a blood  transfusion. 7. Type 2 diabetes mellitus uncontrolled with acute kidney injury.  Hemoglobin A1c elevated at greater than 14.  On low-dose Lantus. 8. Severe malnutrition 9. Stage II decubitus ulcer on right buttock 10. Lactobacillus and urine culture. Antibiotic should have covered this.  Code Status:     Code Status Orders  (From admission, onward)         Start     Ordered   08/21/19 1940  Full code  Continuous     08/21/19 1939        Code Status History    Date Active Date Inactive Code Status Order ID Comments User Context   03/29/2018 2042 03/31/2018 1846 Full Code [id number]  Saundra Shelling, MD Inpatient   01/23/2018 2104 01/28/2018 2143 Full Code [id number]  Saundra Shelling, MD Inpatient   12/21/2017 1750 12/23/2017 2222 Full Code [id number]  Saundra Shelling, MD Inpatient   Advance Care Planning Activity    Advance Directive Documentation     Most Recent Value  Type of Advance Directive  Healthcare Power of Attorney  Pre-existing out of facility DNR order (yellow form or pink MOST form)  -  "MOST" Form in Place?  -     Family Communication: Spoke with brother on the phone today Disposition Plan: We will need rehab once discharged from the hospital.  Consultants:  Infectious disease  Pulmonary  Antibiotics:  Oral Zyvox  Time spent: 28 minutes. Case discussed with infectious disease and care manager and nursing staff.  [place]  [facility] [employer]

## 2019-08-28 NOTE — Progress Notes (Signed)
ID Says she has pain left groin In bed No cough  O/E No distress, but irritable Pale Poor dentition Chest b/l air entry [id number] Abd soft eccymosis and bruising Left hip no swelling- I am able to passively move the leg- restricted flexion due to pain     Patient Vitals for the past 24 hrs:  BP Temp Temp src Pulse Resp SpO2  08/28/19 1252 (!) 88/53 - - 78 - 100 %  08/28/19 1046 (!) 72/50 (!) 97.5 F (36.4 C) Oral 79 - 100 %  08/28/19 0826 - - - - - 100 %  08/27/19 1959 101/64 (!) 97.3 F (36.3 C) Oral 79 18 100 %          CBC Latest Ref Rng & Units 08/26/2019 08/25/2019 08/24/2019  WBC 4.0 - 10.5 K/uL 11.4(H) 11.7(H) 14.1(H)  Hemoglobin 12.0 - 15.0 g/dL 7.5(L) 7.1(L) 7.2(L)  Hematocrit 36.0 - 46.0 % 22.7(L) 21.8(L) 21.5(L)  Platelets 150 - 400 K/uL 370 364 357    CMP Latest Ref Rng & Units 08/26/2019 08/25/2019 08/24/2019  Glucose 70 - 99 mg/dL 130(H) 106(H) 209(H)  BUN 8 - 23 mg/dL 22 22 27(H)  Creatinine 0.44 - 1.00 mg/dL 0.73 0.83 0.79  Sodium 135 - 145 mmol/L 135 134(L) 132(L)  Potassium 3.5 - 5.1 mmol/L 3.8 3.9 4.1  Chloride 98 - 111 mmol/L 106 106 108  CO2 22 - 32 mmol/L 22 20(L) 19(L)  Calcium 8.9 - 10.3 mg/dL 7.8(L) 7.8(L) 7.9(L)  Total Protein 6.5 - 8.1 g/dL - - -  Total Bilirubin 0.3 - 1.2 mg/dL - - -  Alkaline Phos 38 - 126 U/L - - -  AST 15 - 41 U/L - - -  ALT 0 - 44 U/L - - -    08/28/19    08/21/19  Impression/recommendation  B/l nodular and cavitating lesions b/l lungs. Infection VS non infection Infectious cause The infectious cause list is extensive including common organisms like Staph ( MRSA) anerobes, gram neg, nocardia actino, atypical mycobacteria, fungal like aspergillus, cocci, blasto, histo The histoplasma galactomannin of 0.9 -unclear significance will send urine antigen, and also fungal antibodies- Beta D glucan pending  Non infectious cause includes wegeners, sarcoid, langerhans histiocytosis, rheumatoid etc- but ANCA, Rh  factor negative  As she is unable to produce sputum and the repeat CXR has shown no improvement ( rather worsening) will need Bronchoscopy and BAL sent for bacteria, fungl and atypical mycobacteria cultures and cytology  2D echo shows grossly normal tricuspid valve/mitral valve.  Aortic valve and pulmonary valve was not well visualized.  EF is 60 to 65%. Could this be atypical infection like tuberculosis or atypical mycobacteria or fungal.   Left Groin /hip pain- Xray N --is this primary pathology in the hip joint VS secondary pain from lumbar spine  ? MRI hip/lumbar spine  Bruising over abdominal wall ? Due to heparin - in the left flank area looks like [place] turner but she has no pain. ?? Need for CT abdomen  Indeterminate quantiferon gold.  Pt unable to give sputum- PPD placed  Hyponatremia has resolved  Anemia Lactobacillus in urine culture is likely contaminant- no significance  AKI resolved  Smoker  Discussed the management with the care team Discussed with pulmonologist- for Bronch monday

## 2019-08-28 NOTE — Progress Notes (Signed)
PT Cancellation Note  Patient Details Name: Julia Shea MRN: [id number] DOB: 07/14/1956   Cancelled Treatment:    Reason Eval/Treat Not Completed: Medical issues which prohibited therapy(Pt's BP is 72/41mmHg. Hgb 7.5. Of note, RN also reports pt had CXR for TB; MD will read results tomorrow. Will hold PT at this time.)   Madilyn Hook 08/28/2019, 11:58 AM

## 2019-08-29 LAB — GLUCOSE, CAPILLARY
Glucose-Capillary: 104 mg/dL — ABNORMAL HIGH (ref 70–99)
Glucose-Capillary: 123 mg/dL — ABNORMAL HIGH (ref 70–99)
Glucose-Capillary: 122 mg/dL — ABNORMAL HIGH (ref 70–99)
Glucose-Capillary: 111 mg/dL — ABNORMAL HIGH (ref 70–99)

## 2019-08-29 LAB — BASIC METABOLIC PANEL
Anion gap: 8 (ref 5–15)
BUN: 27 mg/dL — ABNORMAL HIGH (ref 8–23)
CO2: 22 mmol/L (ref 22–32)
Calcium: 7.5 mg/dL — ABNORMAL LOW (ref 8.9–10.3)
Chloride: 105 mmol/L (ref 98–111)
Creatinine, Ser: 0.95 mg/dL (ref 0.44–1.00)
GFR calc Af Amer: >60 mL/min (ref >60)
GFR calc non Af Amer: >60 mL/min (ref >60)
Glucose, Bld: 129 mg/dL — ABNORMAL HIGH (ref 70–99)
Potassium: 3.6 mmol/L (ref 3.5–5.1)
Sodium: 135 mmol/L (ref 135–145)

## 2019-08-29 LAB — LIPASE, BLOOD: Lipase: 19 U/L (ref 11–51)

## 2019-08-29 LAB — AMYLASE: Amylase: 44 U/L (ref 28–100)

## 2019-08-29 LAB — CORTISOL-AM, BLOOD: Cortisol - AM: 11.9 ug/dL (ref 6.7–22.6)

## 2019-08-29 LAB — PREPARE RBC (CROSSMATCH)

## 2019-08-29 LAB — CBC
HCT: 21.9 % — ABNORMAL LOW (ref 36.0–46.0)
Hemoglobin: 6.9 g/dL — ABNORMAL LOW (ref 12.0–15.0)
MCH: 24.9 pg — ABNORMAL LOW (ref 26.0–34.0)
MCHC: 31.5 g/dL (ref 30.0–36.0)
MCV: 79.1 fL — ABNORMAL LOW (ref 80.0–100.0)
Platelets: 362 10*3/uL (ref 150–400)
RBC: 2.77 MIL/uL — ABNORMAL LOW (ref 3.87–5.11)
RDW: 14.6 % (ref 11.5–15.5)
WBC: 10.6 10*3/uL — ABNORMAL HIGH (ref 4.0–10.5)
nRBC: 0 % (ref 0.0–0.2)

## 2019-08-29 LAB — ABO/RH: ABO/RH(D): O NEG

## 2019-08-29 MED ORDER — ACETAMINOPHEN 325 MG PO TABS
650.0000 mg | ORAL_TABLET | Freq: Once | ORAL | Status: AC
  Administered 2019-08-29: 650 mg via ORAL
  Filled 2019-08-29: qty 2

## 2019-08-29 MED ORDER — SODIUM CHLORIDE 0.9% IV SOLUTION
Freq: Once | INTRAVENOUS | Status: AC
  Administered 2019-08-29: 16:00:00 via INTRAVENOUS

## 2019-08-29 NOTE — Progress Notes (Signed)
Patient ID: Delania Dijoseph, female   DOB: [birth year], 63 y.o.   MRN: [id number] [facility] PROGRESS NOTE  Reigan Moala [id number] DOB: 09/21/1956 DOA: 08/21/2019 PCP: Patient, No Pcp Per  HPI/Subjective: Patient stating that she is having a lot of bowel movements.  She states that they are formed and not liquid.  Still with some shortness of breath.  Her left groin pain is better.  Objective: Vitals:   08/28/19 2320 08/29/19 0350  BP: (!) 94/48 101/65  Pulse: 74 74  Resp: 18 18  Temp: (!) 97.5 F (36.4 C) 97.6 F (36.4 C)  SpO2: 100% 100%    Intake/Output Summary (Last 24 hours) at 08/29/2019 0759 Last data filed at 08/28/2019 2000 Gross per 24 hour  Intake 121.92 ml  Output 252 ml  Net -130.08 ml   Filed Weights   08/21/19 1317 08/22/19 0300  Weight: 41.7 kg 52.7 kg    ROS: Review of Systems  Constitutional: Negative for chills and fever.  Eyes: Negative for blurred vision.  Respiratory: Positive for shortness of breath. Negative for cough.   Cardiovascular: Negative for chest pain.  Gastrointestinal: Negative for abdominal pain, constipation, diarrhea, nausea and vomiting.  Genitourinary: Negative for dysuria.  Musculoskeletal: Positive for joint pain.  Neurological: Negative for dizziness and headaches.   Exam: Physical Exam  Constitutional: She is oriented to person, place, and time.  HENT:  Nose: No mucosal edema.  Mouth/Throat: No oropharyngeal exudate or posterior oropharyngeal edema.  Eyes: Pupils are equal, round, and reactive to light. Conjunctivae, EOM and lids are normal.  Neck: No JVD present. Carotid bruit is not present. No edema present. No thyroid mass and no thyromegaly present.  Cardiovascular: S1 normal and S2 normal. Exam reveals no gallop.  No murmur heard. Pulses:      Dorsalis pedis pulses are 2+ on the right side and 2+ on the left side.  Respiratory: No respiratory distress. She has decreased breath sounds in the right lower  field and the left lower field. She has no wheezes. She has no rhonchi. She has no rales.  GI: Soft. Bowel sounds are normal. There is no abdominal tenderness.  Musculoskeletal:     Right ankle: She exhibits no swelling.     Left ankle: She exhibits no swelling.  Lymphadenopathy:    She has no cervical adenopathy.  Neurological: She is alert and oriented to person, place, and time. No cranial nerve deficit.  Skin: Skin is warm. Nails show no clubbing.  Stage 2 decubiti on right buttuck Numerous scabs lower extremities and upper extremities  Psychiatric: She has a normal mood and affect.      Data Reviewed: Basic Metabolic Panel: Recent Labs  Lab 08/23/19 0421 08/24/19 0413 08/25/19 0408 08/26/19 0322 08/29/19 0556  NA 133* 132* 134* 135 135  K 4.3 4.1 3.9 3.8 3.6  CL 108 108 106 106 105  CO2 18* 19* 20* 22 22  GLUCOSE 130* 209* 106* 130* 129*  BUN 31* 27* 22 22 27*  CREATININE 0.96 0.79 0.83 0.73 0.95  CALCIUM 8.1* 7.9* 7.8* 7.8* 7.5*  MG  --  2.0  --   --   --     Recent Labs  Lab 08/29/19 0556  LIPASE 19  AMYLASE 44   CBC: Recent Labs  Lab 08/24/19 0413 08/25/19 0408 08/26/19 0322 08/28/19 1348 08/29/19 0556  WBC 14.1* 11.7* 11.4* 9.2 10.6*  NEUTROABS  --   --   --  6.9  --   HGB  7.2* 7.1* 7.5* 6.7* 6.9*  HCT 21.5* 21.8* 22.7* 20.4* 21.9*  MCV 74.7* 77.3* 74.9* 74.7* 79.1*  PLT 357 364 370 306 362    CBG: Recent Labs  Lab 08/28/19 0834 08/28/19 0946 08/28/19 1234 08/28/19 1640 08/28/19 2020  GLUCAP 154* 167* 146* 97 108*    Recent Results (from the past 240 hour(s))  Urine culture     Status: Abnormal   Collection Time: 08/21/19  1:27 PM   Specimen: Urine, Random  Result Value Ref Range Status   Specimen Description   Final    URINE, RANDOM Performed at [facility], [address], [place], [place] [address]    Special Requests   Final    NONE Performed at [facility], [address] [place], [place] [address]     Culture (A)  Final    >=100,000 COLONIES/mL LACTOBACILLUS SPECIES Standardized susceptibility testing for this organism is not available. Performed at [facility], [place] [address], [place], [place] [address]    Report Status 08/23/2019 FINAL  Final  SARS CORONAVIRUS 2 (TAT 6-24 HRS) Nasopharyngeal Nasopharyngeal Swab     Status: None   Collection Time: 08/21/19  3:42 PM   Specimen: Nasopharyngeal Swab  Result Value Ref Range Status   SARS Coronavirus 2 NEGATIVE NEGATIVE Final    Comment: (NOTE) SARS-CoV-2 target nucleic acids are NOT DETECTED. The SARS-CoV-2 RNA is generally detectable in upper and lower respiratory specimens during the acute phase of infection. Negative results do not preclude SARS-CoV-2 infection, do not rule out co-infections with other pathogens, and should not be used as the sole basis for treatment or other patient management decisions. Negative results must be combined with clinical observations, patient history, and epidemiological information. The expected result is Negative. Fact Sheet for Patients: SugarRoll.be Fact Sheet for Healthcare Providers: https://www.woods-mathews.com/ This test is not yet approved or cleared by the [place] FDA and  has been authorized for detection and/or diagnosis of SARS-CoV-2 by FDA under an Emergency Use Authorization (EUA). This EUA will remain  in effect (meaning this test can be used) for the duration of the COVID-19 declaration under Section 56 4(b)(1) of the Act, 21 U.S.C. section 360bbb-3(b)(1), unless the authorization is terminated or revoked sooner. Performed at [facility], [place] [address] [place], [place] [address]   Blood culture (routine x 2)     Status: None   Collection Time: 08/21/19  3:42 PM   Specimen: BLOOD  Result Value Ref Range Status   Specimen Description BLOOD RIGHT ANTECUBITAL  Final   Special Requests   Final    BOTTLES DRAWN  AEROBIC AND ANAEROBIC Blood Culture adequate volume   Culture   Final    NO GROWTH 5 DAYS Performed at [facility], [address], [place], [place] [address]    Report Status 08/26/2019 FINAL  Final  Blood culture (routine x 2)     Status: None   Collection Time: 08/21/19  3:42 PM   Specimen: BLOOD  Result Value Ref Range Status   Specimen Description BLOOD BLOOD RIGHT FOREARM  Final   Special Requests   Final    BOTTLES DRAWN AEROBIC AND ANAEROBIC Blood Culture adequate volume   Culture   Final    NO GROWTH 5 DAYS Performed at [facility], [address] [place], [place] [address]    Report Status 08/26/2019 FINAL  Final  MRSA PCR Screening     Status: Abnormal   Collection  Time: 08/22/19 11:41 AM  Result Value Ref Range Status   MRSA by PCR POSITIVE (A) NEGATIVE Final    Comment:        The GeneXpert MRSA Assay (FDA approved for NASAL specimens only), is one component of a comprehensive MRSA colonization surveillance program. It is not intended to diagnose MRSA infection nor to guide or monitor treatment for MRSA infections. RESULT CALLED TO, READ BACK BY AND VERIFIED WITH: JANE HODGE ON 08/22/2019 AT 1345 TIK Performed at [facility], [address] [place], [place] [address]   Expectorated sputum assessment w rflx to resp cult     Status: None   Collection Time: 08/23/19 10:00 AM   Specimen: Sputum  Result Value Ref Range Status   Specimen Description SPUTUM  Final   Special Requests NONE  Final   Sputum evaluation   Final    Sputum specimen not acceptable for testing.  Please recollect.   C/CHELSEA KNIGHT AT [id number] 08/23/2019.PMF Performed at [facility], [address] [place], [place] [address]    Report Status 08/23/2019 FINAL  Final  Culture, expectorated sputum-assessment     Status: None   Collection Time: 08/24/19 11:25 AM   Specimen: Expectorated Sputum  Result Value Ref Range Status   Specimen Description  EXPECTORATED SPUTUM  Final   Special Requests NONE  Final   Sputum evaluation   Final    Sputum specimen not acceptable for testing.  Please recollect.   CALLED TO ASHLEY RAMIREZ @1846  08/24/19 MJU Performed at [facility], [address] [place], [place] [address]    Report Status 08/24/2019 FINAL  Final      Scheduled Meds: . sodium chloride   Intravenous Once  . acetaminophen  650 mg Oral Once  . enoxaparin (LOVENOX) injection  40 mg Subcutaneous Q24H  . ferrous sulfate  325 mg Oral Q breakfast  . folic acid  1 mg Oral Daily  . insulin aspart  0-9 Units Subcutaneous TID WC  . insulin glargine  10 Units Subcutaneous QHS  . linezolid  600 mg Oral Q12H  . midodrine  5 mg Oral TID WC  . pantoprazole  40 mg Oral Daily  . Ensure Max Protein  11 oz Oral BID BM   Continuous Infusions: . sodium chloride 20 mL (08/29/19 0139)  . cefTRIAXone (ROCEPHIN)  IV 2 g (08/28/19 1541)  . metronidazole 500 mg (08/29/19 0140)    Assessment/Plan:  1. Cavitary lesions and lung nodules.  Radiologist suspected infectious inflammatory etiology. Infectious disease doctor changed antibiotics over to oral Zyvox, Rocephin and Flagyl.  PPD read by me is negative.. QuantiFERON gold indeterminate.  The ANA is positive with Robo nucleic protein being only slightly elevated.  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  I will call rheumatology on Monday.  The ANCA, rheumatoid factor and anti-GBM antibody are in the normal range. We are unable to induce sputum for AFB.  Bronchoscopy for Monday. 2. Anemia of chronic disease.  Hemoglobin dipped down to 6.9.  Case discussed with patient and brother.  Consented for blood transfusion.  Benefits and risks of transfusion explained by me. 3. Hypotension.  Transfuse blood today.  Continue low-dose midodrine. 4. Left groin pain.  Is better as per patient.  X-ray negative. 5. Hyponatremia.  This has normalized 6. Acute kidney injury.   Improved. 7. Type 2 diabetes mellitus uncontrolled with acute kidney injury.  Hemoglobin A1c elevated at greater than 14.  On low-dose Lantus.  Sugars are much better. 8. Severe malnutrition 9. Stage II decubitus ulcer on right buttock 10. Lactobacillus and urine culture. Antibiotic should have covered this.  Code Status:     Code Status Orders  (From admission, onward)         Start     Ordered   08/21/19 1940  Full code  Continuous     08/21/19 1939        Code Status History    Date Active Date Inactive Code Status Order ID Comments User Context   03/29/2018 2042 03/31/2018 1846 Full Code [id number]  Saundra Shelling, MD Inpatient   01/23/2018 2104 01/28/2018 2143 Full Code [id number]  Saundra Shelling, MD Inpatient   12/21/2017 1750 12/23/2017 2222 Full Code [id number]  Saundra Shelling, MD Inpatient   Advance Care Planning Activity    Advance Directive Documentation     Most Recent Value  Type of Advance Directive  Healthcare Power of Attorney  Pre-existing out of facility DNR order (yellow form or pink MOST form)  -  "MOST" Form in Place?  -     Family Communication: Spoke with brother on the phone today Disposition Plan: We will need rehab once discharged from the hospital.  Consultants:  Infectious disease  Pulmonary  Antibiotics:  Oral Zyvox  Rocephin  Flagyl  Time spent: 27 minutes. Case discussed with nursing staff.  [place]  [facility]

## 2019-08-29 NOTE — Progress Notes (Signed)
PT Cancellation Note  Patient Details Name: Shauntae Statzer MRN: [id number] DOB: 12/03/1955   Cancelled Treatment:    Reason Eval/Treat Not Completed: Medical issues which prohibited therapy.  Hgb 6.9 and low BP today, will reattempt at another time.   Ramond Dial 08/29/2019, 2:54 PM   Mee Hives, PT MS Acute Rehab Dept. Number: [place]

## 2019-08-29 NOTE — Progress Notes (Signed)
SLP Cancellation Note  Patient Details Name: Julia Shea MRN: [id number] DOB: Mar 01, 1956   Cancelled treatment:       Reason Eval/Treat Not Completed: Medical issues which prohibited therapy Spoke to nsg who states pt has had no change or upgrade in status. ST will continue to monitor.    Tomie China 08/29/2019, 9:48 AM

## 2019-08-30 ENCOUNTER — Inpatient Hospital Stay: Payer: Medicare Other

## 2019-08-30 DIAGNOSIS — M25559 Pain in unspecified hip: Secondary | ICD-10-CM

## 2019-08-30 DIAGNOSIS — N63 Unspecified lump in unspecified breast: Secondary | ICD-10-CM

## 2019-08-30 LAB — BPAM RBC
Blood Product Expiration Date: 202011302359
ISSUE DATE / TIME: 202011281537
Unit Type and Rh: 9500

## 2019-08-30 LAB — TYPE AND SCREEN
ABO/RH(D): O NEG
Antibody Screen: NEGATIVE
Unit division: 0

## 2019-08-30 LAB — GLUCOSE, CAPILLARY
Glucose-Capillary: 82 mg/dL (ref 70–99)
Glucose-Capillary: 100 mg/dL — ABNORMAL HIGH (ref 70–99)
Glucose-Capillary: 134 mg/dL — ABNORMAL HIGH (ref 70–99)

## 2019-08-30 LAB — CBC
HCT: 26.4 % — ABNORMAL LOW (ref 36.0–46.0)
Hemoglobin: 8.9 g/dL — ABNORMAL LOW (ref 12.0–15.0)
MCH: 25.6 pg — ABNORMAL LOW (ref 26.0–34.0)
MCHC: 33.7 g/dL (ref 30.0–36.0)
MCV: 75.9 fL — ABNORMAL LOW (ref 80.0–100.0)
Platelets: 355 10*3/uL (ref 150–400)
RBC: 3.48 MIL/uL — ABNORMAL LOW (ref 3.87–5.11)
RDW: 14.5 % (ref 11.5–15.5)
WBC: 9.8 10*3/uL (ref 4.0–10.5)
nRBC: 0 % (ref 0.0–0.2)

## 2019-08-30 MED ORDER — NYSTATIN 100000 UNIT/ML MT SUSP
5.0000 mL | Freq: Four times a day (QID) | OROMUCOSAL | Status: DC
  Administered 2019-08-30 – 2019-09-09 (×34): 500000 [IU] via ORAL
  Filled 2019-08-30 (×34): qty 5

## 2019-08-30 MED ORDER — INSULIN GLARGINE 100 UNIT/ML ~~LOC~~ SOLN
10.0000 [IU] | Freq: Every day | SUBCUTANEOUS | Status: DC
  Filled 2019-08-30: qty 0.1

## 2019-08-30 NOTE — Progress Notes (Signed)
Patient ID: Julia Shea, female   DOB: 06/10/1956, 63 y.o.   MRN: [id number] [facility] PROGRESS NOTE  Julia Shea [id number] DOB: 24-Dec-1955 DOA: 08/21/2019 PCP: Patient, No Pcp Per  HPI/Subjective: Patient feeling weak.  Has not walked much since coming into the hospital.  Breathing is better.  Some joint pain.  Objective: Vitals:   08/29/19 2306 08/30/19 0954  BP: 118/68 123/60  Pulse: 79 72  Resp:    Temp:    SpO2:  100%    Intake/Output Summary (Last 24 hours) at 08/30/2019 1216 Last data filed at 08/30/2019 0155 Gross per 24 hour  Intake 670.5 ml  Output -  Net 670.5 ml   Filed Weights   08/21/19 1317 08/22/19 0300  Weight: 41.7 kg 52.7 kg    ROS: Review of Systems  Constitutional: Positive for malaise/fatigue. Negative for chills and fever.  Eyes: Negative for blurred vision.  Respiratory: Negative for cough and shortness of breath.   Cardiovascular: Negative for chest pain.  Gastrointestinal: Negative for abdominal pain, constipation, diarrhea, nausea and vomiting.  Genitourinary: Negative for dysuria.  Musculoskeletal: Positive for joint pain.  Neurological: Negative for dizziness and headaches.   Exam: Physical Exam  Constitutional: She is oriented to person, place, and time.  HENT:  Nose: No mucosal edema.  Mouth/Throat: No oropharyngeal exudate or posterior oropharyngeal edema.  Eyes: Pupils are equal, round, and reactive to light. Conjunctivae, EOM and lids are normal.  Neck: No JVD present. Carotid bruit is not present. No edema present. No thyroid mass and no thyromegaly present.  Cardiovascular: S1 normal and S2 normal. Exam reveals no gallop.  No murmur heard. Pulses:      Dorsalis pedis pulses are 2+ on the right side and 2+ on the left side.  Respiratory: No respiratory distress. She has decreased breath sounds in the right lower field and the left lower field. She has no wheezes. She has no rhonchi. She has no rales.  GI: Soft.  Bowel sounds are normal. There is no abdominal tenderness.  Musculoskeletal:     Right ankle: She exhibits no swelling.     Left ankle: She exhibits no swelling.  Lymphadenopathy:    She has no cervical adenopathy.  Neurological: She is alert and oriented to person, place, and time. No cranial nerve deficit.  Skin: Skin is warm. Nails show no clubbing.  Stage 2 decubiti on right buttuck Numerous scabs lower extremities and upper extremities  Psychiatric: She has a normal mood and affect.      Data Reviewed: Basic Metabolic Panel: Recent Labs  Lab 08/24/19 0413 08/25/19 0408 08/26/19 0322 08/29/19 0556  NA 132* 134* 135 135  K 4.1 3.9 3.8 3.6  CL 108 106 106 105  CO2 19* 20* 22 22  GLUCOSE 209* 106* 130* 129*  BUN 27* 22 22 27*  CREATININE 0.79 0.83 0.73 0.95  CALCIUM 7.9* 7.8* 7.8* 7.5*  MG 2.0  --   --   --     Recent Labs  Lab 08/29/19 0556  LIPASE 19  AMYLASE 44   CBC: Recent Labs  Lab 08/25/19 0408 08/26/19 0322 08/28/19 1348 08/29/19 0556 08/30/19 0420  WBC 11.7* 11.4* 9.2 10.6* 9.8  NEUTROABS  --   --  6.9  --   --   HGB 7.1* 7.5* 6.7* 6.9* 8.9*  HCT 21.8* 22.7* 20.4* 21.9* 26.4*  MCV 77.3* 74.9* 74.7* 79.1* 75.9*  PLT 364 370 306 362 355    CBG: Recent Labs  Lab  08/29/19 1207 08/29/19 1743 08/29/19 2304 08/30/19 0900 08/30/19 1213  GLUCAP 123* 122* 111* 82 100*    Recent Results (from the past 240 hour(s))  Urine culture     Status: Abnormal   Collection Time: 08/21/19  1:27 PM   Specimen: Urine, Random  Result Value Ref Range Status   Specimen Description   Final    URINE, RANDOM Performed at [facility], [address] [place], [place] [address]    Special Requests   Final    NONE Performed at [facility], [address] [place], [place] [address]    Culture (A)  Final    >=100,000 COLONIES/mL LACTOBACILLUS SPECIES Standardized susceptibility testing for this organism is not available. Performed at  [facility], [place] [address], [place], [place] [address]    Report Status 08/23/2019 FINAL  Final  SARS CORONAVIRUS 2 (TAT 6-24 HRS) Nasopharyngeal Nasopharyngeal Swab     Status: None   Collection Time: 08/21/19  3:42 PM   Specimen: Nasopharyngeal Swab  Result Value Ref Range Status   SARS Coronavirus 2 NEGATIVE NEGATIVE Final    Comment: (NOTE) SARS-CoV-2 target nucleic acids are NOT DETECTED. The SARS-CoV-2 RNA is generally detectable in upper and lower respiratory specimens during the acute phase of infection. Negative results do not preclude SARS-CoV-2 infection, do not rule out co-infections with other pathogens, and should not be used as the sole basis for treatment or other patient management decisions. Negative results must be combined with clinical observations, patient history, and epidemiological information. The expected result is Negative. Fact Sheet for Patients: SugarRoll.be Fact Sheet for Healthcare Providers: https://www.woods-mathews.com/ This test is not yet approved or cleared by the [place] FDA and  has been authorized for detection and/or diagnosis of SARS-CoV-2 by FDA under an Emergency Use Authorization (EUA). This EUA will remain  in effect (meaning this test can be used) for the duration of the COVID-19 declaration under Section 56 4(b)(1) of the Act, 21 U.S.C. section 360bbb-3(b)(1), unless the authorization is terminated or revoked sooner. Performed at [facility], [place] [address] [place], [place] [address]   Blood culture (routine x 2)     Status: None   Collection Time: 08/21/19  3:42 PM   Specimen: BLOOD  Result Value Ref Range Status   Specimen Description BLOOD RIGHT ANTECUBITAL  Final   Special Requests   Final    BOTTLES DRAWN AEROBIC AND ANAEROBIC Blood Culture adequate volume   Culture   Final    NO GROWTH 5 DAYS Performed at [facility], [address],  [place], [place] [address]    Report Status 08/26/2019 FINAL  Final  Blood culture (routine x 2)     Status: None   Collection Time: 08/21/19  3:42 PM   Specimen: BLOOD  Result Value Ref Range Status   Specimen Description BLOOD BLOOD RIGHT FOREARM  Final   Special Requests   Final    BOTTLES DRAWN AEROBIC AND ANAEROBIC Blood Culture adequate volume   Culture   Final    NO GROWTH 5 DAYS Performed at [facility], [address] [place],  [address]    Report Status 08/26/2019 FINAL  Final  MRSA PCR Screening     Status: Abnormal   Collection Time: 08/22/19 11:41 AM  Result Value Ref Range Status   MRSA by PCR POSITIVE (A) NEGATIVE Final    Comment:        The GeneXpert MRSA Assay (  FDA approved for NASAL specimens only), is one component of a comprehensive MRSA colonization surveillance program. It is not intended to diagnose MRSA infection nor to guide or monitor treatment for MRSA infections. RESULT CALLED TO, READ BACK BY AND VERIFIED WITH: Julia Shea ON 08/22/2019 AT 1345 TIK Performed at [facility], [address] [place], [place] [address]   Expectorated sputum assessment w rflx to resp cult     Status: None   Collection Time: 08/23/19 10:00 AM   Specimen: Sputum  Result Value Ref Range Status   Specimen Description SPUTUM  Final   Special Requests NONE  Final   Sputum evaluation   Final    Sputum specimen not acceptable for testing.  Please recollect.   C/Julia Shea AT [id number] 08/23/2019.PMF Performed at [facility], [address] [place], [place] [address]    Report Status 08/23/2019 FINAL  Final  Culture, expectorated sputum-assessment     Status: None   Collection Time: 08/24/19 11:25 AM   Specimen: Expectorated Sputum  Result Value Ref Range Status   Specimen Description EXPECTORATED SPUTUM  Final   Special Requests NONE  Final   Sputum evaluation   Final    Sputum specimen not acceptable for testing.  Please  recollect.   CALLED TO Julia Shea @1846  08/24/19 MJU Performed at [facility], [address] [place],  [address]    Report Status 08/24/2019 FINAL  Final      Scheduled Meds: . enoxaparin (LOVENOX) injection  40 mg Subcutaneous Q24H  . ferrous sulfate  325 mg Oral Q breakfast  . folic acid  1 mg Oral Daily  . insulin aspart  0-9 Units Subcutaneous TID WC  . insulin glargine  10 Units Subcutaneous QHS  . linezolid  600 mg Oral Q12H  . midodrine  5 mg Oral TID WC  . nystatin  5 mL Oral QID  . pantoprazole  40 mg Oral Daily  . Ensure Max Protein  11 oz Oral BID BM   Continuous Infusions: . sodium chloride Stopped (08/30/19 0155)  . cefTRIAXone (ROCEPHIN)  IV 2 g (08/29/19 1516)  . metronidazole 500 mg (08/30/19 1006)    Assessment/Plan:  1. Cavitary lesions and lung nodules.  Radiologist suspected infectious inflammatory etiology. Infectious disease doctor changed antibiotics over to oral Zyvox, Rocephin and Flagyl.  PPD read by me on 08/29/2019 is Negative. QuantiFERON gold indeterminate.  The ANA is positive with Ribonucleic protein being only slightly elevated.  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  I will call rheumatology on Monday.  The ANCA, rheumatoid factor and anti-GBM antibody are in the normal range. We are unable to induce sputum for AFB.  Bronchoscopy for Monday.  I will make n.p.o. after midnight tonight.  We will hold Lovenox.  Looks like pulmonary ordered a repeat CT scan of the chest. 2. Anemia of chronic disease.  After transfusion.  Hemoglobin came up to 8.9. 3. Hypotension.  Better after transfusion and with low-dose midodrine. 4. Left groin pain.  Is better as per patient.  X-ray negative. 5. Hyponatremia.  This has normalized 6. Acute kidney injury.  Improved. 7. Type 2 diabetes mellitus uncontrolled with acute kidney injury.  Hemoglobin A1c elevated at greater than 14.  On low-dose Lantus.  Sugars are much  better. 8. Severe malnutrition 9. Stage II decubitus ulcer on right buttock 10. Lactobacillus and urine culture. Antibiotic should have covered this.  Code Status:     Code Status Orders  (  From admission, onward)         Start     Ordered   08/21/19 1940  Full code  Continuous     08/21/19 1939        Code Status History    Date Active Date Inactive Code Status Order ID Comments User Context   03/29/2018 2042 03/31/2018 1846 Full Code [id number]  Saundra Shelling, MD Inpatient   01/23/2018 2104 01/28/2018 2143 Full Code [id number]  Saundra Shelling, MD Inpatient   12/21/2017 1750 12/23/2017 2222 Full Code [id number]  Saundra Shelling, MD Inpatient   Advance Care Planning Activity    Advance Directive Documentation     Most Recent Value  Type of Advance Directive  Healthcare Power of Attorney  Pre-existing out of facility DNR order (yellow form or pink MOST form)  -  "MOST" Form in Place?  -     Family Communication: Spoke with brother on the phone on 08/29/2019. Disposition Plan: Bronchoscopy for Monday.  We will need rehab once discharged from the hospital.  Consultants:  Infectious disease  Pulmonary  Antibiotics:  Oral Zyvox  Rocephin  Flagyl  Time spent: 26 minutes.  [place]  [facility] [employer]

## 2019-08-30 NOTE — Progress Notes (Signed)
Pulmonary Critical Care  Initial Consult Note  Julia Shea [id number] DOB: [birth year] DOA: 08/21/2019    HPI: Julia Shea is a 63 y.o. female follow-up for cavitary lung lesions.  The patient had a follow-up CT scan done today which I ordered it appears to show that the cavitation is actually increasing in size.  I spoke with the consulting radiologist and we discussed the possibility of may be doing a CT-guided needle biopsy which would be probably have a higher yield for both infectious and neoplastic etiologies.  Unfortunately over the weekend nothing can be done so we will discuss it with the radiologist that is doing interventional radiology in the morning.  The patient would be considered a high risk for bronchoscopy because of the possibility of AFB and this would lead to numerous hospital staff being exposed also if in the unlikely scenario that this is tuberculosis.  I think a CT-guided needle biopsy would give [place] a firm diagnosis and rule out neoplastic as well has an infectious conditions such as tuberculosis and also the possibility of atypical infections which could include some of the fungus infections etc.    Past Medical History:  Diagnosis Date  . Asthma   . Bilateral hip pain   . 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 Chronic indwelling Foley catheter   . Closed comminuted left humeral fracture    ORIF in June 2017  . Depression   . Diabetes mellitus type 2, with complication, on long term insulin pump ([facility])   . Falls frequently   . Hypertension   . MRSA infection   . Nausea & vomiting   . Rotator cuff tear arthropathy, left    Past Surgical History:  Procedure Laterality Date  . LAPAROSCOPIC OOPHORECTOMY     removal of cyst   Social History:  reports that she has never smoked. She has never used smokeless tobacco. She reports that she does not drink alcohol or use drugs.  Allergies   Allergen Reactions  . Penicillins     Has patient had a PCN reaction causing immediate rash, facial/tongue/throat swelling, SOB or lightheadedness with hypotension: Yes Has patient had a PCN reaction causing severe rash involving mucus membranes or skin necrosis: Yes Has patient had a PCN reaction that required hospitalization: Yes Has patient had a PCN reaction occurring within the last 10 years: Yes If all of the above answers are "NO", then may proceed with Cephalosporin use.  . Sulfa Antibiotics     Family History  Problem Relation Age of Onset  . Diabetes Maternal Uncle     Prior to Admission medications   Medication Sig Start Date End Date Taking? Authorizing Provider  insulin NPH-regular Human (70-30) 100 UNIT/ML injection Inject 10 Units into the skin 2 (two) times daily with a meal. 08/13/18  Yes Chaplin, Dianah Field, MD  Multiple Vitamins-Minerals (MULTIVITAMIN WITH MINERALS) tablet Take 1 tablet by mouth daily. 02/06/18  Yes Tukov-Yual, Magdalene S, NP  pantoprazole (PROTONIX) 40 MG tablet Take 1 tablet (40 mg total) by mouth daily. 08/13/18  Yes Tawni Millers, MD  potassium chloride (K-DUR) 10 MEQ tablet Take 1 tablet (10 mEq total) by mouth daily. 08/13/18  Yes Tawni Millers, MD  traZODone (DESYREL) 100 MG tablet Take 1 tablet (100 mg total) by mouth at bedtime. 11/11/18  Yes Tawni Millers, MD   Physical Exam: Vitals:   08/29/19 1603 08/29/19 1943 08/29/19  2306 08/30/19 0954  BP: (!) 85/51 107/61 118/68 123/60  Pulse: 75 86 79 72  Resp: 18 20    Temp: (!) 97.5 F (36.4 C) 97.6 F (36.4 C)    TempSrc: Oral     SpO2: 99% 98%  100%  Weight:      Height:        Wt Readings from Last 3 Encounters:  08/22/19 52.7 kg  08/13/18 52.2 kg  03/29/18 46.9 kg    General:  Appears calm and comfortable Eyes: PERRL, normal lids, irises & conjunctiva ENT: grossly normal hearing, lips & tongue Neck: no LAD, masses or thyromegaly Cardiovascular: RRR, no m/r/g. No LE  edema. Respiratory: CTA bilaterally, no w/r/r.       Normal respiratory effort. Abdomen: soft, nontender Skin: no rash or induration seen on limited exam Musculoskeletal: grossly normal tone BUE/BLE Psychiatric: grossly normal mood and affect Neurologic: grossly non-focal.          Labs on Admission:  Basic Metabolic Panel: Recent Labs  Lab 08/24/19 0413 08/25/19 0408 08/26/19 0322 08/29/19 0556  NA 132* 134* 135 135  K 4.1 3.9 3.8 3.6  CL 108 106 106 105  CO2 19* 20* 22 22  GLUCOSE 209* 106* 130* 129*  BUN 27* 22 22 27*  CREATININE 0.79 0.83 0.73 0.95  CALCIUM 7.9* 7.8* 7.8* 7.5*  MG 2.0  --   --   --    Liver Function Tests: No results for input(s): AST, ALT, ALKPHOS, BILITOT, PROT, ALBUMIN in the last 168 hours. Recent Labs  Lab 08/29/19 0556  LIPASE 19  AMYLASE 44   No results for input(s): AMMONIA in the last 168 hours. CBC: Recent Labs  Lab 08/25/19 0408 08/26/19 0322 08/28/19 1348 08/29/19 0556 08/30/19 0420  WBC 11.7* 11.4* 9.2 10.6* 9.8  NEUTROABS  --   --  6.9  --   --   HGB 7.1* 7.5* 6.7* 6.9* 8.9*  HCT 21.8* 22.7* 20.4* 21.9* 26.4*  MCV 77.3* 74.9* 74.7* 79.1* 75.9*  PLT 364 370 306 362 355   Cardiac Enzymes: No results for input(s): CKTOTAL, CKMB, CKMBINDEX, TROPONINI in the last 168 hours.  BNP (last 3 results) No results for input(s): BNP in the last 8760 hours.  ProBNP (last 3 results) No results for input(s): PROBNP in the last 8760 hours.  CBG: Recent Labs  Lab 08/29/19 1207 08/29/19 1743 08/29/19 2304 08/30/19 0900 08/30/19 1213  GLUCAP 123* 122* 111* 82 100*    Radiological Exams on Admission: Ct Chest Wo Contrast  Result Date: 08/30/2019 CLINICAL DATA:  Cavitary masses seen in the lungs on August 21, 2019, favored to be infectious. Follow-up. EXAM: CT CHEST WITHOUT CONTRAST TECHNIQUE: Multidetector CT imaging of the chest was performed following the standard protocol without IV contrast. COMPARISON:  CT of the chest  August 21, 2019. Chest x-ray August 28, 2019. FINDINGS: Cardiovascular: Atherosclerotic changes are seen in the nonaneurysmal aorta. Central pulmonary arteries are normal in caliber. Coronary artery calcifications involve the LAD and circumflex branches of the left coronary artery. The heart is unchanged. Mediastinum/Nodes: There are new small bilateral pleural effusions with associated atelectasis. The effusions partially obscure some of the cavitary nodules in the bases. No pericardial effusion is noted. The thyroid is unremarkable. No adenopathy. Lungs/Pleura: Bilateral cavitary nodules and masses are again identified. The cavitary mass seen on series 3, image 37 measures 6.3 by 4.0 cm today versus 5.9 x 3.5 cm previously. There is less air and more fluid/debris within  this mass today. The cavitary mass in the periphery of the right upper lobe is seen on series 3, image 34 measures 5.6 x 3.3 cm today versus 3.6 x 3.5 cm previously. This cavitary mass is clearly larger in the interval. There is some surrounding pulmonary opacity which could represent additional infection. Specifically, there is opacity with air bronchograms just inferior medial to this cavitary mass is seen on series 3, image 44 which was not present previously. The cavitary mass seen on series 2, image 79 of the previous study is partially obscured by pleural fluid today making comparison difficult. However, this mass measures at least 4.7 x 2.8 cm today versus 5.2 x 2.4 cm previously. The adjacent cavitary mass located just superiorly is partially obscured by pleural fluid as well measuring at least 2.8 by 2.2 cm today versus 2.6 x 1.9 cm previously. There is a cluster of cavitary nodules in the left base adjacent to the diaphragm. Several of these nodules appear to have merged measuring 3.8 x 2.8 cm today. The largest measured 2.9 x 2.1 cm previously. A new tiny nodule is seen in the left upper lobe on axial image 53. There is a new nodule  seen in the left base on image 100. A new nodule is seen in the right base on image 90. A few other new nodules are noted. Several previously identified the nodules which were not measured on the previous study are larger today. Upper Abdomen: No acute abnormality. Musculoskeletal: No chest wall mass or suspicious bone lesions identified. IMPRESSION: 1. Numerous bilateral cavitary nodules and masses are identified. There are several new nodules. Most of the previously identified nodules and masses are larger. The findings could be due to atypical infection, cavitary malignancy, or septic emboli. The patient is scheduled for bronchoscopy. 2. Atherosclerotic change in the thoracic aorta. Coronary artery calcifications. 3. Bilateral pleural effusions. 4. Aortic Atherosclerosis (ICD10-I70.0). Electronically Signed   By: Dorise Bullion III M.D   On: 08/30/2019 15:14    EKG: Independently reviewed.  Assessment/Plan Principal Problem:   Hyponatremia Active Problems:   Diabetes mellitus type 2, uncontrolled (HCC)   Severe malnutrition (HCC)   Acute lower UTI   AKI (acute kidney injury) ([place])   Cavitary lung disease   Hypotension   Left groin pain   Anemia of chronic disease   Hip pain   1. Tentatively we will try to schedule for bronchoscopy but as explained above biopsy is not likely to be fruitful and is also increased risk of having pneumothorax because of the cavitary nature of the disease.  It may be better to try to do a CT-guided needle aspiration from one of the lesions in the lower lung zones on the right side.  I will discuss this with the interventional radiologist tomorrow in the morning to see if it may be able to be scheduled.  We will however go ahead and get a consent for bronchoscopy in any case.  I have discussed the procedure including the risks and benefits with the patient at the bedside.  Code Status: full code  Family Communication: none Disposition Plan: home  Time spent:  51min  I have personally obtained a history, examined the patient, evaluated laboratory and imaging results, formulated the assessment and plan and placed orders.  The Patient requires high complexity decision making for assessment and support. Total Time Spent 72min   Giacomo Valone A Hollie Wojahn, MD [facility] Pulmonary Critical Care Medicine Sleep Medicine

## 2019-08-31 ENCOUNTER — Encounter
Admission: EM | Disposition: A | Discharge status: Skilled Nursing Facility | Payer: Self-pay | Source: Home / Self Care | Attending: Internal Medicine

## 2019-08-31 DIAGNOSIS — Z515 Encounter for palliative care: Secondary | ICD-10-CM

## 2019-08-31 LAB — MISC LABCORP TEST (SEND OUT): LabCorp test name: 164284

## 2019-08-31 LAB — GLUCOSE, CAPILLARY
Glucose-Capillary: 92 mg/dL (ref 70–99)
Glucose-Capillary: 103 mg/dL — ABNORMAL HIGH (ref 70–99)
Glucose-Capillary: 86 mg/dL (ref 70–99)
Glucose-Capillary: 64 mg/dL — ABNORMAL LOW (ref 70–99)
Glucose-Capillary: 95 mg/dL (ref 70–99)
Glucose-Capillary: 146 mg/dL — ABNORMAL HIGH (ref 70–99)

## 2019-08-31 SURGERY — BRONCHOSCOPY, FLEXIBLE
Anesthesia: Moderate Sedation | Laterality: Bilateral

## 2019-08-31 MED ORDER — INSULIN GLARGINE 100 UNIT/ML ~~LOC~~ SOLN
6.0000 [IU] | Freq: Every day | SUBCUTANEOUS | Status: DC
  Administered 2019-09-01 – 2019-09-09 (×9): 6 [IU] via SUBCUTANEOUS
  Filled 2019-08-31 (×9): qty 0.06

## 2019-08-31 NOTE — Progress Notes (Signed)
PT Cancellation Note  Patient Details Name: Koy Szopinski MRN: [id number] DOB: July 21, 1956   Cancelled Treatment:    Reason Eval/Treat Not Completed: Patient declined, no reason specified.  PT attempted to call to room to ask if pt ready for therapy with no answer.  Upon entering, pt in sidelying and stating that she felt too "weak" to participate.  PT encouraged pt that movement will help to prevent weakness and joint stiffness and pt asked that PT "come back later".  Will re-attempt when time allows.  Roxanne Gates, PT, DPT  Roxanne Gates 08/31/2019, 12:50 PM

## 2019-08-31 NOTE — Progress Notes (Signed)
Patient ID: Berla Bouey, female   DOB: 04-10-1956, 63 y.o.   MRN: [id number] [facility] PROGRESS NOTE  Delaysia Paup [id number] DOB: Nov 08, 1955 DOA: 08/21/2019 PCP: Patient, No Pcp Per  HPI/Subjective: Patient feels weak.  No complaints of cough or shortness of breath.  The groin pain that she had is gone.  Objective: Vitals:   08/30/19 1753 08/30/19 2306  BP: 139/80 133/74  Pulse: 76 72  Resp: 18 16  Temp: 97.6 F (36.4 C) 98.2 F (36.8 C)  SpO2: 100% 100%   No intake or output data in the 24 hours ending 08/31/19 1516 Filed Weights   08/21/19 1317 08/22/19 0300  Weight: 41.7 kg 52.7 kg    ROS: Review of Systems  Constitutional: Positive for malaise/fatigue. Negative for chills and fever.  Eyes: Negative for blurred vision.  Respiratory: Negative for cough and shortness of breath.   Cardiovascular: Negative for chest pain.  Gastrointestinal: Negative for abdominal pain, constipation, diarrhea, nausea and vomiting.  Genitourinary: Negative for dysuria.  Musculoskeletal: Negative for joint pain.  Neurological: Negative for dizziness and headaches.   Exam: Physical Exam  Constitutional: She is oriented to person, place, and time.  HENT:  Nose: No mucosal edema.  Mouth/Throat: No oropharyngeal exudate or posterior oropharyngeal edema.  Eyes: Pupils are equal, round, and reactive to light. Conjunctivae, EOM and lids are normal.  Neck: No JVD present. Carotid bruit is not present. No edema present. No thyroid mass and no thyromegaly present.  Cardiovascular: S1 normal and S2 normal. Exam reveals no gallop.  No murmur heard. Pulses:      Dorsalis pedis pulses are 2+ on the right side and 2+ on the left side.  Respiratory: No respiratory distress. She has decreased breath sounds in the right lower field and the left lower field. She has no wheezes. She has no rhonchi. She has no rales.  GI: Soft. Bowel sounds are normal. There is no abdominal tenderness.   Musculoskeletal:     Right ankle: She exhibits no swelling.     Left ankle: She exhibits no swelling.  Lymphadenopathy:    She has no cervical adenopathy.  Neurological: She is alert and oriented to person, place, and time. No cranial nerve deficit.  Skin: Skin is warm. Nails show no clubbing.  Stage 2 decubiti on right buttuck Numerous scabs lower extremities and upper extremities  Psychiatric: She has a normal mood and affect.      Data Reviewed: Basic Metabolic Panel: Recent Labs  Lab 08/25/19 0408 08/26/19 0322 08/29/19 0556  NA 134* 135 135  K 3.9 3.8 3.6  CL 106 106 105  CO2 20* 22 22  GLUCOSE 106* 130* 129*  BUN 22 22 27*  CREATININE 0.83 0.73 0.95  CALCIUM 7.8* 7.8* 7.5*    Recent Labs  Lab 08/29/19 0556  LIPASE 19  AMYLASE 44   CBC: Recent Labs  Lab 08/25/19 0408 08/26/19 0322 08/28/19 1348 08/29/19 0556 08/30/19 0420  WBC 11.7* 11.4* 9.2 10.6* 9.8  NEUTROABS  --   --  6.9  --   --   HGB 7.1* 7.5* 6.7* 6.9* 8.9*  HCT 21.8* 22.7* 20.4* 21.9* 26.4*  MCV 77.3* 74.9* 74.7* 79.1* 75.9*  PLT 364 370 306 362 355    CBG: Recent Labs  Lab 08/30/19 1213 08/30/19 1717 08/30/19 2300 08/31/19 0819 08/31/19 1202  GLUCAP 100* 92 134* 103* 86    Recent Results (from the past 240 hour(s))  SARS CORONAVIRUS 2 (TAT 6-24 HRS) Nasopharyngeal Nasopharyngeal  Swab     Status: None   Collection Time: 08/21/19  3:42 PM   Specimen: Nasopharyngeal Swab  Result Value Ref Range Status   SARS Coronavirus 2 NEGATIVE NEGATIVE Final    Comment: (NOTE) SARS-CoV-2 target nucleic acids are NOT DETECTED. The SARS-CoV-2 RNA is generally detectable in upper and lower respiratory specimens during the acute phase of infection. Negative results do not preclude SARS-CoV-2 infection, do not rule out co-infections with other pathogens, and should not be used as the sole basis for treatment or other patient management decisions. Negative results must be combined with clinical  observations, patient history, and epidemiological information. The expected result is Negative. Fact Sheet for Patients: SugarRoll.be Fact Sheet for Healthcare Providers: https://www.woods-mathews.com/ This test is not yet approved or cleared by the [place] FDA and  has been authorized for detection and/or diagnosis of SARS-CoV-2 by FDA under an Emergency Use Authorization (EUA). This EUA will remain  in effect (meaning this test can be used) for the duration of the COVID-19 declaration under Section 56 4(b)(1) of the Act, 21 U.S.C. section 360bbb-3(b)(1), unless the authorization is terminated or revoked sooner. Performed at [facility], [place] [address] [place], [place] [address]   Blood culture (routine x 2)     Status: None   Collection Time: 08/21/19  3:42 PM   Specimen: BLOOD  Result Value Ref Range Status   Specimen Description BLOOD RIGHT ANTECUBITAL  Final   Special Requests   Final    BOTTLES DRAWN AEROBIC AND ANAEROBIC Blood Culture adequate volume   Culture   Final    NO GROWTH 5 DAYS Performed at [facility], [address] [place], [place] [address]    Report Status 08/26/2019 FINAL  Final  Blood culture (routine x 2)     Status: None   Collection Time: 08/21/19  3:42 PM   Specimen: BLOOD  Result Value Ref Range Status   Specimen Description BLOOD BLOOD RIGHT FOREARM  Final   Special Requests   Final    BOTTLES DRAWN AEROBIC AND ANAEROBIC Blood Culture adequate volume   Culture   Final    NO GROWTH 5 DAYS Performed at [facility], [address] [place], [place] [address]    Report Status 08/26/2019 FINAL  Final  MRSA PCR Screening     Status: Abnormal   Collection Time: 08/22/19 11:41 AM  Result Value Ref Range Status   MRSA by PCR POSITIVE (A) NEGATIVE Final    Comment:        The GeneXpert MRSA Assay (FDA approved for NASAL specimens only), is one component of  a comprehensive MRSA colonization surveillance program. It is not intended to diagnose MRSA infection nor to guide or monitor treatment for MRSA infections. RESULT CALLED TO, READ BACK BY AND VERIFIED WITH: JANE HODGE ON 08/22/2019 AT 1345 TIK Performed at [facility], [address] [place], [place] [address]   Expectorated sputum assessment w rflx to resp cult     Status: None   Collection Time: 08/23/19 10:00 AM   Specimen: Sputum  Result Value Ref Range Status   Specimen Description SPUTUM  Final   Special Requests NONE  Final   Sputum evaluation   Final    Sputum specimen not acceptable for testing.  Please recollect.   C/CHELSEA KNIGHT AT [id number] 08/23/2019.PMF Performed at [facility], [address], [place], [place] [address]    Report Status 08/23/2019 FINAL  Final  Culture,  expectorated sputum-assessment     Status: None   Collection Time: 08/24/19 11:25 AM   Specimen: Expectorated Sputum  Result Value Ref Range Status   Specimen Description EXPECTORATED SPUTUM  Final   Special Requests NONE  Final   Sputum evaluation   Final    Sputum specimen not acceptable for testing.  Please recollect.   CALLED TO ASHLEY RAMIREZ @1846  08/24/19 MJU Performed at [facility], [address] [place], [place] [address]    Report Status 08/24/2019 FINAL  Final      Scheduled Meds: . ferrous sulfate  325 mg Oral Q breakfast  . folic acid  1 mg Oral Daily  . insulin aspart  0-9 Units Subcutaneous TID WC  . [START ON 09/01/2019] insulin glargine  6 Units Subcutaneous QHS  . linezolid  600 mg Oral Q12H  . midodrine  5 mg Oral TID WC  . nystatin  5 mL Oral QID  . pantoprazole  40 mg Oral Daily  . Ensure Max Protein  11 oz Oral BID BM   Continuous Infusions: . sodium chloride 250 mL (08/31/19 0213)  . cefTRIAXone (ROCEPHIN)  IV Stopped (08/30/19 1930)  . metronidazole 500 mg (08/31/19 0900)    Assessment/Plan:  1. Cavitary lesions and lung  nodules.  Radiologist suspected infectious inflammatory etiology. Infectious disease doctor changed antibiotics over to oral Zyvox, Rocephin and Flagyl.  PPD is negative.  QuantiFERON gold indeterminate.  Case discussed with rheumatology and titers of the ANA and other things are only slightly high and does not believe that this would go along with mixed connective tissue disorder and recommended biopsy.  Pulmonary discussed with interventional radiology and they will proceed with CT-guided needle placement tomorrow. 2. Anemia of chronic disease.  After transfusion.  Hemoglobin came up to 8.9. 3. Hypotension.  Better after transfusion and with low-dose midodrine. 4. Left groin pain.  Is better as per patient.  X-ray negative. 5. Hyponatremia.  This has normalized 6. Acute kidney injury.  Improved. 7. Type 2 diabetes mellitus uncontrolled with acute kidney injury.  Hemoglobin A1c elevated at greater than 14.  Holding Lantus since patient was n.p.o. most of the day and will be n.p.o. again tomorrow.  May be able to get by with oral medications. 8. Severe malnutrition 9. Stage II decubitus ulcer on right buttock 10. Lactobacillus and urine culture. Antibiotic should have covered this.  Code Status:     Code Status Orders  (From admission, onward)         Start     Ordered   08/21/19 1940  Full code  Continuous     08/21/19 1939        Code Status History    Date Active Date Inactive Code Status Order ID Comments User Context   03/29/2018 2042 03/31/2018 1846 Full Code [id number]  Saundra Shelling, MD Inpatient   01/23/2018 2104 01/28/2018 2143 Full Code [id number]  Saundra Shelling, MD Inpatient   12/21/2017 1750 12/23/2017 2222 Full Code [id number]  Saundra Shelling, MD Inpatient   Advance Care Planning Activity    Advance Directive Documentation     Most Recent Value  Type of Advance Directive  Healthcare Power of Attorney  Pre-existing out of facility DNR order (yellow form or pink MOST form)  -   "MOST" Form in Place?  -     Family Communication: Left message for brother on the phone Disposition Plan: CT-guided biopsy for tomorrow  Consultants:  Infectious disease  Pulmonary  Antibiotics:  Oral Zyvox  Rocephin  Flagyl  Time spent: 27 minutes.  Case discussed with infectious disease and pulmonary.  [place]  [facility] [employer]

## 2019-08-31 NOTE — Progress Notes (Signed)
Nutrition Follow-up  DOCUMENTATION CODES:   Not applicable  INTERVENTION:  Continue Ensure Max Protein po BID, each supplement provides 150 kcal and 30 grams of protein. Patient prefers vanilla.  NUTRITION DIAGNOSIS:   Inadequate oral intake related to decreased appetite as evidenced by per patient/family report.  Resolving per pt report.  GOAL:   Patient will meet greater than or equal to 90% of their needs  Progressing.  MONITOR:   PO intake, Supplement acceptance, Diet advancement, Labs, Weight trends, Skin, I & O's  REASON FOR ASSESSMENT:   Malnutrition Screening Tool    ASSESSMENT:   63 year old female with PMHx of HTN, depression, asthma, DM admitted after being found down by neighbors and found to have cavitary lesions in left lung undergoing work-up, UTI, hypotension, N/V, AKI, hyperglycemia, anemia, possible left breast mass.  Patient remains on airborne precautions. Spoke with her over the phone. She has been NPO today for upcoming procedure so she has not had anything to eat yet. She is hungry and is hoping she will leave for procedure soon so she can order a meal. She reports when she receives a tray she is able to eat very well. She reports eating 75-100% of her trays. There is limited documentation of meal completion in chart. She reports she likes breakfast food better than the options at lunch and dinner. Discussed that since we have food service she can order whatever foods she prefers at any meal. She likes Ensure Max and reports she is drinking them regularly. She enjoys milk and reports she is ordering an extra milk with her trays.  Medications reviewed and include: ferrous sulfate [id number] mg daily, folic acid 1 mg daily, Novolog 0-9 units TID, Lantus 6 units QHS, pantoprazole, ceftriaxone, Flagyl.  Labs reviewed: CBG 86-134.  Diet Order:   Diet Order            Diet NPO time specified Except for: Sips with Meds  Diet effective midnight              EDUCATION NEEDS:   No education needs have been identified at this time  Skin:  Skin Assessment: Skin Integrity Issues:(wounds bilateral buttocks (1cm x 1cm))  Last BM:  08/24/2019 per chart  Height:   Ht Readings from Last 1 Encounters:  08/21/19 5\' 2"  (1.575 m)   Weight:   Wt Readings from Last 1 Encounters:  08/22/19 52.7 kg   Ideal Body Weight:  50 kg  BMI:  Body mass index is 21.23 kg/m.  Estimated Nutritional Needs:   Kcal:  1400-1600  Protein:  70-80 grams  Fluid:  1.4-1.6 L/day  Jacklynn Barnacle, MS, RD, LDN Office: [phone] Pager: [phone] After Hours/Weekend Pager: [phone]

## 2019-08-31 NOTE — Progress Notes (Signed)
SLP Note  Patient Details Name: Julia Shea MRN: [id number] DOB: Mar 18, 1956   Cancelled treatment:       Reason Eval/Treat Not Completed: (chart reviewed). Consulted NSG re: pt's swallowing status; toleration of po's. NSG reported pt was doing well swallowing Pills in puree and w/ few bites/sips at meals. There was no increased coughing during oral intake, nor decline in respiratory status. Pt continues to present w/ cavitary lesions and masses w/ new nodules. Pulmonary MD has recommended a "CT-guided needle biopsy to further dx and r/o neoplastic as well has an infectious conditions such as tuberculosis and also the possibility of atypical infections.  Noted no fevers and WBC has been wnl post admission. Pt is Not on O2 support; she remains on an oral diet of Minced foods, thin liquids d/t Poor Dentition status.  Recommend continue the Minced diet for easier mastication of foods; general aspiration precautions and pills in Puree as needed. NSG/MD to reconsult ST services if any new changes/decline in her swallowing status while admitted. NSG agreed.      [name], [place], CCC-SLP [name] 08/31/2019, 5:00 PM

## 2019-08-31 NOTE — Progress Notes (Signed)
Discussed with intervention radiologist and radiology nurse Pt will have IR guided pulmonary cavitay biopsy- tissue to be sent for aerobic/anerobic/fungl/mycobacteria culture,. Cytoloy and HPE will be done as per Dr.Khan to r/o malignancy

## 2019-08-31 NOTE — Consult Note (Signed)
Consultation Note Date: 08/31/2019   Patient Name: Julia Shea  DOB: Jun 24, 1956  MRN: [id number]  Age / Sex: 63 y.o., female  PCP: Patient, No Pcp Per Referring Physician: Loletha Grayer, MD  Reason for Consultation: Establishing goals of care  HPI/Patient Profile: Julia Shea is an 63 y.o. female with history of asthma, bilateral hip pain, depression, hypertension, history of MRSA infection, insulin-dependent diabetes mellitus, CKD stage III.  She presented to the hospital because of nausea, vomiting, generalized weakness and left hip pain.  She has had pain in her left hip for about a week.  Clinical Assessment and Goals of Care:  Patient is resting in bed on airborne precautions in negative pressure room. She denies pain or other complaint at this time. She states she is not married and does not have children. She states she is close to her brother, and would want him to be her decision maker if she is unable to make decisions.  She lives alone.  She states a few years ago she had a hospitalization "where I was very sick", and over the past few years since then, she has "felt terrible." She states she does not have an appetite, and requires assistance with ambulation. She states although she lives alone, her friend is with her much of the time to assist her. She states she lacks energy, and is depressed. She states she is frustrated that nobody can tell her an underlying diagnosis for her symptoms.   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      SUMMARY OF RECOMMENDATIONS   Will continue to follow to discuss [place].   Prognosis:   Unable to determine  Discharge Planning: To Be Determined      Primary Diagnoses: Present on Admission: . Hyponatremia . Acute lower UTI . AKI (acute kidney injury) ([place]) . Diabetes  mellitus type 2, uncontrolled ([place])   I have reviewed the medical record, interviewed the patient and family, and examined the patient. The following aspects are pertinent.  Past Medical History:  Diagnosis Date  . Asthma   . Bilateral hip pain   . 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 Chronic indwelling Foley catheter   . Closed comminuted left humeral fracture    ORIF in June 2017  . Depression   . Diabetes mellitus type 2, with complication, on long term insulin pump ([place])   . Falls frequently   . Hypertension   . MRSA infection   . Nausea & vomiting   . Rotator cuff tear arthropathy, left    Social History   Socioeconomic History  . Marital status: Single    Spouse name: Not on file  . Number of children: Not on file  . Years of education: Not on file  . Highest education level: Not on file  Occupational History  . Occupation: retired  Scientific laboratory technician  .  Financial resource strain: Not on file  . Food insecurity    Worry: Not on file    Inability: Not on file  . Transportation needs    Medical: Not on file    Non-medical: Not on file  Tobacco Use  . Smoking status: Never Smoker  . Smokeless tobacco: Never Used  Substance and Sexual Activity  . Alcohol use: No  . Drug use: No  . Sexual activity: Not on file  Lifestyle  . Physical activity    Days per week: Not on file    Minutes per session: Not on file  . Stress: Not on file  Relationships  . Social Herbalist on phone: Not on file    Gets together: Not on file    Attends religious service: Not on file    Active member of club or organization: Not on file    Attends meetings of clubs or organizations: Not on file    Relationship status: Not on file  Other Topics Concern  . Not on file  Social History Narrative  . Not on file   Family History  Problem Relation Age of Onset  . Diabetes Maternal Uncle    Scheduled Meds: .  ferrous sulfate  325 mg Oral Q breakfast  . folic acid  1 mg Oral Daily  . insulin aspart  0-9 Units Subcutaneous TID WC  . insulin glargine  10 Units Subcutaneous QHS  . linezolid  600 mg Oral Q12H  . midodrine  5 mg Oral TID WC  . nystatin  5 mL Oral QID  . pantoprazole  40 mg Oral Daily  . Ensure Max Protein  11 oz Oral BID BM   Continuous Infusions: . sodium chloride 250 mL (08/31/19 0213)  . cefTRIAXone (ROCEPHIN)  IV Stopped (08/30/19 1930)  . metronidazole 500 mg (08/31/19 0900)   PRN Meds:.sodium chloride, acetaminophen **OR** acetaminophen, albuterol Medications Prior to Admission:  Prior to Admission medications   Medication Sig Start Date End Date Taking? Authorizing Provider  insulin NPH-regular Human (70-30) 100 UNIT/ML injection Inject 10 Units into the skin 2 (two) times daily with a meal. 08/13/18  Yes Chaplin, Dianah Field, MD  Multiple Vitamins-Minerals (MULTIVITAMIN WITH MINERALS) tablet Take 1 tablet by mouth daily. 02/06/18  Yes Tukov-Yual, Magdalene S, NP  pantoprazole (PROTONIX) 40 MG tablet Take 1 tablet (40 mg total) by mouth daily. 08/13/18  Yes Tawni Millers, MD  potassium chloride (K-DUR) 10 MEQ tablet Take 1 tablet (10 mEq total) by mouth daily. 08/13/18  Yes Tawni Millers, MD  traZODone (DESYREL) 100 MG tablet Take 1 tablet (100 mg total) by mouth at bedtime. 11/11/18  Yes Tawni Millers, MD   Allergies  Allergen Reactions  . Penicillins     Has patient had a PCN reaction causing immediate rash, facial/tongue/throat swelling, SOB or lightheadedness with hypotension: Yes Has patient had a PCN reaction causing severe rash involving mucus membranes or skin necrosis: Yes Has patient had a PCN reaction that required hospitalization: Yes Has patient had a PCN reaction occurring within the last 10 years: Yes If all of the above answers are "NO", then may proceed with Cephalosporin use.  . Sulfa Antibiotics    Review of Systems  All other systems reviewed and are  negative.   Physical Exam Pulmonary:     Effort: Pulmonary effort is normal.     Comments: Speaks in complete sentences.  Neurological:     Mental Status: She is  alert.     Vital Signs: BP 133/74 (BP Location: Right Arm)   Pulse 72   Temp 98.2 F (36.8 C)   Resp 16   Ht 5\' 2"  (1.575 m)   Wt 52.7 kg   SpO2 100%   BMI 21.23 kg/m  Pain Scale: 0-10 POSS *See Group Information*: 1-Acceptable,Awake and alert Pain Score: Asleep   SpO2: SpO2: 100 % O2 Device:SpO2: 100 % O2 Flow Rate: .   IO: Intake/output summary: No intake or output data in the 24 hours ending 08/31/19 1042  LBM: Last BM Date: 08/30/19 Baseline Weight: Weight: 41.7 kg Most recent weight: Weight: 52.7 kg     Palliative Assessment/Data:  Has not worked with PT due to HGB.      Time In: 10:00 Time Out: 10:30 Time Total: 30 min Greater than 50%  of this time was spent counseling and coordinating care related to the above assessment and plan.  Signed by: Asencion Gowda, NP   Please contact Palliative Medicine Team phone at [phone] for questions and concerns.  For individual provider: See Shea Evans

## 2019-09-01 ENCOUNTER — Inpatient Hospital Stay: Payer: Medicare Other

## 2019-09-01 DIAGNOSIS — R197 Diarrhea, unspecified: Secondary | ICD-10-CM

## 2019-09-01 LAB — CBC
HCT: 26 % — ABNORMAL LOW (ref 36.0–46.0)
Hemoglobin: 8.8 g/dL — ABNORMAL LOW (ref 12.0–15.0)
MCH: 25.7 pg — ABNORMAL LOW (ref 26.0–34.0)
MCHC: 33.8 g/dL (ref 30.0–36.0)
MCV: 76 fL — ABNORMAL LOW (ref 80.0–100.0)
Platelets: 289 10*3/uL (ref 150–400)
RBC: 3.42 MIL/uL — ABNORMAL LOW (ref 3.87–5.11)
RDW: 14.6 % (ref 11.5–15.5)
WBC: 7.8 10*3/uL (ref 4.0–10.5)
nRBC: 0 % (ref 0.0–0.2)

## 2019-09-01 LAB — ANA COMPREHENSIVE PANEL
Anti JO-1: <0.2 AI (ref 0.0–0.9)
Centromere Ab Screen: <0.2 AI (ref 0.0–0.9)
Chromatin Ab SerPl-aCnc: <0.2 AI (ref 0.0–0.9)
ENA SM Ab Ser-aCnc: 1.6 AI — ABNORMAL HIGH (ref 0.0–0.9)
Ribonucleic Protein: 1.2 AI — ABNORMAL HIGH (ref 0.0–0.9)
SSA (Ro) (ENA) Antibody, IgG: <0.2 AI (ref 0.0–0.9)
SSB (La) (ENA) Antibody, IgG: <0.2 AI (ref 0.0–0.9)
Scleroderma (Scl-70) (ENA) Antibody, IgG: <0.2 AI (ref 0.0–0.9)
ds DNA Ab: <1 IU/mL (ref 0–9)

## 2019-09-01 LAB — GLUCOSE, CAPILLARY
Glucose-Capillary: 167 mg/dL — ABNORMAL HIGH (ref 70–99)
Glucose-Capillary: 141 mg/dL — ABNORMAL HIGH (ref 70–99)
Glucose-Capillary: 122 mg/dL — ABNORMAL HIGH (ref 70–99)
Glucose-Capillary: 169 mg/dL — ABNORMAL HIGH (ref 70–99)

## 2019-09-01 LAB — ANCA TITERS
Atypical P-ANCA titer: <1:20 {titer}
C-ANCA: <1:20 {titer}
P-ANCA: <1:20 {titer}

## 2019-09-01 LAB — PROTIME-INR
INR: 1.4 — ABNORMAL HIGH (ref 0.8–1.2)
Prothrombin Time: 16.7 seconds — ABNORMAL HIGH (ref 11.4–15.2)

## 2019-09-01 LAB — MPO/PR-3 (ANCA) ANTIBODIES
ANCA Proteinase 3: <3.5 U/mL (ref 0.0–3.5)
Myeloperoxidase Abs: <9 U/mL (ref 0.0–9.0)

## 2019-09-01 LAB — RHEUMATOID FACTOR: Rheumatoid fact SerPl-aCnc: 16.1 IU/mL — ABNORMAL HIGH (ref 0.0–13.9)

## 2019-09-01 LAB — GLOMERULAR BASEMENT MEMBRANE ANTIBODIES: GBM Ab: 7 units (ref 0–20)

## 2019-09-01 MED ORDER — FENTANYL CITRATE (PF) 100 MCG/2ML IJ SOLN
INTRAMUSCULAR | Status: AC
  Filled 2019-09-01: qty 2

## 2019-09-01 MED ORDER — VANCOMYCIN HCL IN DEXTROSE 1-5 GM/200ML-% IV SOLN
1000.0000 mg | INTRAVENOUS | Status: DC
  Administered 2019-09-02: 1000 mg via INTRAVENOUS
  Filled 2019-09-01 (×2): qty 200

## 2019-09-01 MED ORDER — LOPERAMIDE HCL 2 MG PO CAPS
2.0000 mg | ORAL_CAPSULE | Freq: Three times a day (TID) | ORAL | Status: DC | PRN
  Administered 2019-09-01 – 2019-09-08 (×7): 2 mg via ORAL
  Filled 2019-09-01 (×9): qty 1

## 2019-09-01 MED ORDER — MIDAZOLAM HCL 5 MG/5ML IJ SOLN
INTRAMUSCULAR | Status: AC
  Filled 2019-09-01: qty 5

## 2019-09-01 MED ORDER — VANCOMYCIN HCL 1.25 G IV SOLR
1250.0000 mg | Freq: Once | INTRAVENOUS | Status: AC
  Administered 2019-09-01: 1250 mg via INTRAVENOUS
  Filled 2019-09-01: qty 1250

## 2019-09-01 NOTE — Progress Notes (Signed)
   09/01/19 1200  Clinical Encounter Type  Visited With Patient  Visit Type Initial;Psychological support;Spiritual support  Referral From Palliative care team  Spiritual Encounters  Spiritual Needs [facility] text;Emotional  Stress Factors  Patient Stress Factors Health changes  Ch received a referral from Palliative Care team to attend to the pt. Ch made a phone call as pt was in a isolation room. Pt burst out crying and shared how difficult her situations have been. She has had to continuously go through different tests and put on soft food diet or NPO. Pt enjoys food and it has been hard on her. Pt also misses being with people and finds the limited contact she has with nurses isn't satisfying her needs. Pt talked about her brother and how she wants to give up at times but her brother wants her to live. Ch provided a listening ear and made space for her to vent thus normalizing her thoughts and feelings. Pt brightened up a little bit after crying and letting her emotions out. Ch then offered to read Psalm 121 as pt complained of not being able to read due to her cateracts. Pt appreciated the reading. The call was interrupted as another care team member visited her. Ch will follow up with the pt to continue providing emotional and spiritual support.

## 2019-09-01 NOTE — Progress Notes (Signed)
Pt stable after lung mass bx.VSS.Chest ok.Samples sent to lab posterior bx CT ok.

## 2019-09-01 NOTE — Consult Note (Signed)
Pharmacy Antibiotic Note  Julia Shea is a 63 y.o. female admitted on 08/21/2019 with pneumonia.  Pharmacy has been consulted for vancomycin dosing. Keep trough around 15 per Dr. Delaine Lame. Pt is currently on ceftriaxone and flagyl. Linezolid d/c'ed.   Plan: Vancomycin 1250 mg x 1 loading dose followed by vancomycin 1000 mg q24H for a Css min of 15. AUC of 610. Plan to get levels in 4-5 days.   Height: 5\' 2"  (157.5 cm) Weight: 116 lb 1.6 oz (52.7 kg) IBW/kg (Calculated) : 50.1  Temp (24hrs), Avg:98.2 F (36.8 C), Min:97.5 F (36.4 C), Max:98.8 F (37.1 C)  Recent Labs  Lab 08/26/19 0322 08/28/19 1348 08/29/19 0556 08/30/19 0420 09/01/19 0528  WBC 11.4* 9.2 10.6* 9.8 7.8  CREATININE 0.73  --  0.95  --   --     Estimated Creatinine Clearance: 46.7 mL/min (by C-G formula based on SCr of 0.95 mg/dL).    Allergies  Allergen Reactions  . Penicillins     Has patient had a PCN reaction causing immediate rash, facial/tongue/throat swelling, SOB or lightheadedness with hypotension: Yes Has patient had a PCN reaction causing severe rash involving mucus membranes or skin necrosis: Yes Has patient had a PCN reaction that required hospitalization: Yes Has patient had a PCN reaction occurring within the last 10 years: Yes If all of the above answers are "NO", then may proceed with Cephalosporin use.  . Sulfa Antibiotics     Antimicrobials this admission: 11/27 ceftriaxone >>  11/27 flaygl >>  12/1 vancomycin >> 11/25 linezolid >>   Dose adjustments this admission: None  Microbiology results: 11/20 BCx: NGTD 11/20 UCx: Lactobacillus species   11/22 Sputum: not acceptable for testing.   11/21 MRSA PCR: positive 11/20 COVID negative.   Thank you for allowing pharmacy to be a part of this patient's care.  Oswald Hillock, PharmD, BCPS 09/01/2019 3:49 PM

## 2019-09-01 NOTE — Progress Notes (Signed)
Patient clinically stable post Lung biopsy per Dr Register, awake/alert and oriented. Only local anesthetic for procedure, tolerated procedure well, to be NPO for hour after which CXR,NPO til after CXR. Report given to [facility] RN with questions answered and plan reviewed.

## 2019-09-01 NOTE — Progress Notes (Signed)
OT Cancellation Note  Patient Details Name: Adaira Westervelt MRN: [id number] DOB: 09-14-1956   Cancelled Treatment:    Reason Eval/Treat Not Completed: Patient declined, no reason specified. Spoke to pt, declining OT tx this date 2/2 waiting on biopsy. Pt agreeable to OT re-attempt next date.   Jeni Salles, MPH, MS, OTR/L ascom [phone] 09/01/19, 11:07 AM

## 2019-09-01 NOTE — Progress Notes (Signed)
Daily Progress Note   Patient Name: Julia Shea       Date: 09/01/2019 DOB: 12/02/1955  Age: 63 y.o. MRN#: [id number] Attending Physician: Loletha Grayer, MD Primary Care Physician: Patient, No Pcp Per Admit Date: 08/21/2019  Reason for Consultation/Follow-up: Establishing goals of care  Subjective: Patient is resting in bed. She states she has been advised she would have a biopsy of the concerning areas of her lungs, and is ready to get this procedure done to hopefully have answers. She states at this time,  she would want all care possible as long as possible. She states her brother would be her decisions maker if she is unable to make decisions.  Length of Stay: 11  Current Medications: Scheduled Meds:  . ferrous sulfate  325 mg Oral Q breakfast  . folic acid  1 mg Oral Daily  . insulin aspart  0-9 Units Subcutaneous TID WC  . insulin glargine  6 Units Subcutaneous QHS  . linezolid  600 mg Oral Q12H  . midodrine  5 mg Oral TID WC  . nystatin  5 mL Oral QID  . pantoprazole  40 mg Oral Daily  . Ensure Max Protein  11 oz Oral BID BM    Continuous Infusions: . sodium chloride 10 mL/hr at 09/01/19 0150  . cefTRIAXone (ROCEPHIN)  IV Stopped (08/31/19 2353)  . metronidazole 500 mg (09/01/19 1004)    PRN Meds: sodium chloride, acetaminophen **OR** acetaminophen, albuterol, loperamide  Physical Exam Pulmonary:     Effort: Pulmonary effort is normal.  Neurological:     Mental Status: She is alert.             Vital Signs: BP 130/76 (BP Location: Left Arm)   Pulse 82   Temp 98.4 F (36.9 C)   Resp 18   Ht 5\' 2"  (1.575 m)   Wt 52.7 kg   SpO2 100%   BMI 21.23 kg/m  SpO2: SpO2: 100 % O2 Device: O2 Device: Room Air O2 Flow Rate:    Intake/output summary:    Intake/Output Summary (Last 24 hours) at 09/01/2019 1103 Last data filed at 09/01/2019 0600 Gross per 24 hour  Intake -  Output 600 ml  Net -600 ml   LBM: Last BM Date: 08/31/19 Baseline Weight: Weight: 41.7 kg Most recent weight: Weight: 52.7 kg  Palliative Assessment/Data:      Patient Active Problem List   Diagnosis Date Noted  . Hip pain   . Anemia of chronic disease   . Left groin pain   . Cavitary lung disease   . Hypotension   . Hyponatremia 08/21/2019  . Acute lower UTI 08/21/2019  . AKI (acute kidney injury) ([place]) 08/21/2019  . Severe malnutrition ([place]) 03/31/2018  . Hypokalemia 03/29/2018  . Diabetes mellitus type 2, with complication, on long term insulin pump ([place])   . Nausea & vomiting   . Chronic indwelling Foley catheter   . Bladder problem   . Rotator cuff tear arthropathy, left   . Malnutrition of moderate degree 01/24/2018  . Pressure injury of skin 12/22/2017  . Hepatic steatosis 05/15/2013  . Abdominal pain, right upper quadrant 05/07/2013  . Breast cancer screening 11/14/2011  . Hypertriglyceridemia 10/30/2011  . Depression 10/26/2011  . Diabetes mellitus type 2, uncontrolled ([place]) 10/26/2011  . Hypertension 10/26/2011    Palliative Care Assessment & Plan   Recommendations/Plan:  Full code/full scope.    Code Status:    Code Status Orders  (From admission, onward)         Start     Ordered   08/21/19 1940  Full code  Continuous     08/21/19 1939        Code Status History    Date Active Date Inactive Code Status Order ID Comments User Context   03/29/2018 2042 03/31/2018 1846 Full Code [id number]  Saundra Shelling, MD Inpatient   01/23/2018 2104 01/28/2018 2143 Full Code [id number]  Saundra Shelling, MD Inpatient   12/21/2017 1750 12/23/2017 2222 Full Code [id number]  Saundra Shelling, MD Inpatient   Advance Care Planning Activity    Advance Directive Documentation     Most Recent Value  Type of Advance Directive  Healthcare Power  of Attorney  Pre-existing out of facility DNR order (yellow form or pink MOST form)  -  "MOST" Form in Place?  -       Prognosis:   Unable to determine  Discharge Planning:  To Be Determined   Thank you for allowing the Palliative Medicine Team to assist in the care of this patient.   Total Time 15 min Prolonged Time Billed  no       Greater than 50%  of this time was spent counseling and coordinating care related to the above assessment and plan.  Asencion Gowda, NP  Please contact Palliative Medicine Team phone at [phone] for questions and concerns.

## 2019-09-01 NOTE — Care Management Important Message (Signed)
Important Message  Patient Details  Name: Monti Klaphake MRN: [id number] Date of Birth: May 06, 1956   Medicare Important Message Given:  Yes     Shelbie Hutching, RN 09/01/2019, 4:04 PM

## 2019-09-01 NOTE — Progress Notes (Signed)
Patient ID: Bobbyjo Bartoletti, female   DOB: September 06, 1956, 63 y.o.   MRN: [id number] [facility] PROGRESS NOTE  Kiyanna Delmedico [id number] DOB: 05/02/1956 DOA: 08/21/2019 PCP: Patient, No Pcp Per  HPI/Subjective: Patient very tearful today about being n.p.o.  She wants to get out of the hospital soon as possible.  She complains about having diarrhea.  She takes Imodium at home.  Objective: Vitals:   09/01/19 0428 09/01/19 0805  BP: 107/64 130/76  Pulse: 78 82  Resp: 17 18  Temp: (!) 97.5 F (36.4 C) 98.4 F (36.9 C)  SpO2: 100% 100%    Intake/Output Summary (Last 24 hours) at 09/01/2019 1248 Last data filed at 09/01/2019 1127 Gross per 24 hour  Intake -  Output 800 ml  Net -800 ml   Filed Weights   08/21/19 1317 08/22/19 0300  Weight: 41.7 kg 52.7 kg    ROS: Review of Systems  Constitutional: Positive for malaise/fatigue. Negative for chills and fever.  Eyes: Negative for blurred vision.  Respiratory: Negative for cough and shortness of breath.   Cardiovascular: Negative for chest pain.  Gastrointestinal: Positive for diarrhea. Negative for abdominal pain, constipation, nausea and vomiting.  Genitourinary: Negative for dysuria.  Musculoskeletal: Negative for joint pain.  Neurological: Negative for dizziness and headaches.   Exam: Physical Exam  Constitutional: She is oriented to person, place, and time.  HENT:  Nose: No mucosal edema.  Mouth/Throat: No oropharyngeal exudate or posterior oropharyngeal edema.  Eyes: Pupils are equal, round, and reactive to light. Conjunctivae, EOM and lids are normal.  Neck: No JVD present. Carotid bruit is not present. No edema present. No thyroid mass and no thyromegaly present.  Cardiovascular: S1 normal and S2 normal. Exam reveals no gallop.  No murmur heard. Pulses:      Dorsalis pedis pulses are 2+ on the right side and 2+ on the left side.  Respiratory: No respiratory distress. She has decreased breath sounds in the right  lower field and the left lower field. She has no wheezes. She has no rhonchi. She has no rales.  GI: Soft. Bowel sounds are normal. There is no abdominal tenderness.  Musculoskeletal:     Right ankle: She exhibits no swelling.     Left ankle: She exhibits no swelling.  Lymphadenopathy:    She has no cervical adenopathy.  Neurological: She is alert and oriented to person, place, and time. No cranial nerve deficit.  Skin: Skin is warm. Nails show no clubbing.  Stage 2 decubiti on right buttuck Numerous scabs lower extremities and upper extremities  Psychiatric: She has a normal mood and affect.      Data Reviewed: Basic Metabolic Panel: Recent Labs  Lab 08/26/19 0322 08/29/19 0556  NA 135 135  K 3.8 3.6  CL 106 105  CO2 22 22  GLUCOSE 130* 129*  BUN 22 27*  CREATININE 0.73 0.95  CALCIUM 7.8* 7.5*    Recent Labs  Lab 08/29/19 0556  LIPASE 19  AMYLASE 44   CBC: Recent Labs  Lab 08/26/19 0322 08/28/19 1348 08/29/19 0556 08/30/19 0420 09/01/19 0528  WBC 11.4* 9.2 10.6* 9.8 7.8  NEUTROABS  --  6.9  --   --   --   HGB 7.5* 6.7* 6.9* 8.9* 8.8*  HCT 22.7* 20.4* 21.9* 26.4* 26.0*  MCV 74.9* 74.7* 79.1* 75.9* 76.0*  PLT 370 306 362 355 289    CBG: Recent Labs  Lab 08/31/19 1702 08/31/19 1824 08/31/19 2019 09/01/19 0803 09/01/19 1201  GLUCAP 64*  95 146* 167* 141*    Recent Results (from the past 240 hour(s))  Expectorated sputum assessment w rflx to resp cult     Status: None   Collection Time: 08/23/19 10:00 AM   Specimen: Sputum  Result Value Ref Range Status   Specimen Description SPUTUM  Final   Special Requests NONE  Final   Sputum evaluation   Final    Sputum specimen not acceptable for testing.  Please recollect.   C/CHELSEA KNIGHT AT [id number] 08/23/2019.PMF Performed at [facility], [address] [place], [place] [address]    Report Status 08/23/2019 FINAL  Final  Culture, expectorated sputum-assessment     Status: None   Collection  Time: 08/24/19 11:25 AM   Specimen: Expectorated Sputum  Result Value Ref Range Status   Specimen Description EXPECTORATED SPUTUM  Final   Special Requests NONE  Final   Sputum evaluation   Final    Sputum specimen not acceptable for testing.  Please recollect.   CALLED TO ASHLEY RAMIREZ @1846  08/24/19 MJU Performed at [facility], [address] [place],  [address]    Report Status 08/24/2019 FINAL  Final      Scheduled Meds: . fentaNYL      . midazolam      . ferrous sulfate  325 mg Oral Q breakfast  . folic acid  1 mg Oral Daily  . insulin aspart  0-9 Units Subcutaneous TID WC  . insulin glargine  6 Units Subcutaneous QHS  . linezolid  600 mg Oral Q12H  . midodrine  5 mg Oral TID WC  . nystatin  5 mL Oral QID  . pantoprazole  40 mg Oral Daily  . Ensure Max Protein  11 oz Oral BID BM   Continuous Infusions: . sodium chloride 10 mL/hr at 09/01/19 0150  . cefTRIAXone (ROCEPHIN)  IV Stopped (08/31/19 2353)  . metronidazole 500 mg (09/01/19 1004)    Assessment/Plan:  1. Cavitary lesions and lung nodules.  Radiologist suspected infectious inflammatory etiology. Infectious disease doctor has patient on oral Zyvox, Rocephin and Flagyl.  Will likely need a PICC line for long-term IV antibiotics prior to discharge.  PPD is negative.  QuantiFERON gold indeterminate.  Pulmonary discussed with interventional radiology and they will proceed with CT-guided needle placement today.  Case discussed with rheumatologist on the phone and he believes that the ANA and other positive titers only slightly elevated and are nonspecific and recommended the biopsy for diagnosis. 2. Anemia of chronic disease.  After transfusion.  Hemoglobin came up to 8.9. 3. Hypotension.  Better after transfusion and with low-dose midodrine.  Hopefully can stop low-dose midodrine prior to discharge. 4. Left groin pain.  Is better as per patient.  X-ray negative. 5. Hyponatremia.  This has  normalized 6. Acute kidney injury.  Improved. 7. Type 2 diabetes mellitus uncontrolled with acute kidney injury.  Hemoglobin A1c elevated at greater than 14.  Hopefully able to restart low-dose Lantus this evening. 8. Severe malnutrition 9. Stage II decubitus ulcer on right buttock 10. Lactobacillus in urine culture. Antibiotic should have covered this. 11. Chronic diarrhea.  On Imodium.  Code Status:     Code Status Orders  (From admission, onward)         Start     Ordered   08/21/19 1940  Full code  Continuous     08/21/19 1939        Code Status History    Date Active Date Inactive Code Status  Order ID Comments User Context   03/29/2018 2042 03/31/2018 1846 Full Code [id number]  Saundra Shelling, MD Inpatient   01/23/2018 2104 01/28/2018 2143 Full Code [id number]  Saundra Shelling, MD Inpatient   12/21/2017 1750 12/23/2017 2222 Full Code [id number]  Saundra Shelling, MD Inpatient   Advance Care Planning Activity    Advance Directive Documentation     Most Recent Value  Type of Advance Directive  Healthcare Power of Attorney  Pre-existing out of facility DNR order (yellow form or pink MOST form)  -  "MOST" Form in Place?  -     Family Communication: Spoke with brother on the phone Disposition Plan: CT-guided biopsy for today.  Will need AFB negative prior to discharge to rehab.  Consultants:  Infectious disease  Pulmonary  Antibiotics:  Oral Zyvox  Rocephin  Flagyl  Time spent: 26 minutes  [place]

## 2019-09-02 LAB — GLUCOSE, CAPILLARY
Glucose-Capillary: 96 mg/dL (ref 70–99)
Glucose-Capillary: 157 mg/dL — ABNORMAL HIGH (ref 70–99)
Glucose-Capillary: 111 mg/dL — ABNORMAL HIGH (ref 70–99)

## 2019-09-02 LAB — ACID FAST SMEAR (AFB, MYCOBACTERIA): Acid Fast Smear: NEGATIVE

## 2019-09-02 NOTE — Progress Notes (Signed)
Physical Therapy Treatment Patient Details Name: Julia Shea MRN: [id number] DOB: 1956-04-21 Today's Date: 09/02/2019    History of Present Illness Julia Shea is a 63 y.o. F with HTN, DM II, Depression, frequent falls, and asthma who was admitted 11/20 with N/V, generalized weakness and left hip pain. EMS were called for a welfare check by neighbors, reported finding her lying in feces, urine and possible bedbugs.     PT Comments    Pt presented with deficits in strength, transfers, mobility, gait, balance, B knee ext ROM, and activity tolerance.  Pt found with difficulty extending B knees during therex with grossly improved ROM after extensive QS and gentle stretching.  At end of session pt still lacking grossly 10 deg of ext ROM bilaterally with emphasis placed on pt education for positioning and HEP to address the issue.  Pt motivated to participate throughout the session but ultimately was very limited functionally including being unable to clear the surface of the bed during transfer attempts.  Pt will benefit from PT services in a SNF setting upon discharge to safely address above deficits for decreased caregiver assistance and eventual return to PLOF.      Follow Up Recommendations  SNF     Equipment Recommendations  Rolling walker with 5" wheels    Recommendations for Other Services       Precautions / Restrictions Precautions Precautions: Fall Precaution Comments: airborne precautions; bed bugs at her home (consider contact precautions out of abundance of precaution) Restrictions Weight Bearing Restrictions: No    Mobility  Bed Mobility Overal bed mobility: Needs Assistance Bed Mobility: Supine to Sit;Sit to Supine     Supine to sit: Min assist Sit to supine: Min assist   General bed mobility comments: Min A for BLEs in/out of bed and trunk to full upright position  Transfers                 General transfer comment: Multiple attempts to stand from  various bed heights and max A with pt unable to clear the surface of the mattress  Ambulation/Gait             General Gait Details: unable   Stairs             Wheelchair Mobility    Modified Rankin (Stroke Patients Only)       Balance Overall balance assessment: Needs assistance Sitting-balance support: Bilateral upper extremity supported;Feet unsupported Sitting balance-Leahy Scale: Fair         Standing balance comment: Unable                            Cognition Arousal/Alertness: Awake/alert Behavior During Therapy: WFL for tasks assessed/performed Overall Cognitive Status: Within Functional Limits for tasks assessed                                        Exercises Total Joint Exercises Ankle Circles/Pumps: Strengthening;Both;10 reps;15 reps Quad Sets: Strengthening;Both;5 reps;10 reps;15 reps Gluteal Sets: Strengthening;Both;5 reps;10 reps Short Arc Quad: Strengthening;Both;5 reps Heel Slides: AROM;Both;5 reps Hip ABduction/ADduction: AAROM;AROM;Both;5 reps;10 reps Straight Leg Raises: AROM;AAROM;Both;10 reps;5 reps Long Arc Quad: Strengthening;5 reps;10 reps;Both Knee Flexion: Strengthening;Both;5 reps;10 reps Other Exercises Other Exercises: HEP education and review for BLE APs, QS, and GS x 10 each every 1-2 hours Other Exercises: Positioning education and emphasis on BLE QS x  10 every 1-2 hours to prevent B knee flex contractures    General Comments        Pertinent Vitals/Pain Pain Assessment: No/denies pain    Home Living                      Prior Function            PT Goals (current goals can now be found in the care plan section) Progress towards PT goals: Progressing toward goals    Frequency    Min 2X/week      PT Plan Current plan remains appropriate    Co-evaluation              AM-PAC PT "6 Clicks" Mobility   Outcome Measure  Help needed turning from your back to  your side while in a flat bed without using bedrails?: A Little Help needed moving from lying on your back to sitting on the side of a flat bed without using bedrails?: A Little Help needed moving to and from a bed to a chair (including a wheelchair)?: Total Help needed standing up from a chair using your arms (e.g., wheelchair or bedside chair)?: Total Help needed to walk in hospital room?: Total Help needed climbing 3-5 steps with a railing? : Total 6 Click Score: 10    End of Session Equipment Utilized During Treatment: Gait belt Activity Tolerance: Patient tolerated treatment well Patient left: in bed;with bed alarm set;with call bell/phone within reach Nurse Communication: Mobility status PT Visit Diagnosis: Muscle weakness (generalized) (M62.81);History of falling (Z91.81);Difficulty in walking, not elsewhere classified (R26.2);Pain Pain - Right/Left: Left Pain - part of body: Hip     Time: 1338-1410 PT Time Calculation (min) (ACUTE ONLY): 32 min  Charges:  $Therapeutic Exercise: 8-22 mins $Therapeutic Activity: 8-22 mins                     D. Scott [name] PT, DPT 09/02/19, 3:24 PM

## 2019-09-02 NOTE — Progress Notes (Signed)
PROGRESS NOTE    Julia Shea  [id number] DOB: Jan 24, 1956 DOA: 08/21/2019  PCP: Patient, No Pcp Per    LOS - 12   Brief Narrative:  63 y.o. female with history of asthma, bilateral hip pain, depression, hypertension, history of MRSA infection, insulin-dependent diabetes mellitus, CKD stage III.  Presented with N/V, generalized weakness and left hip pain x 1 week.  EMS were called for a welfare check by neighbors, reported finding her lying in feces, urine and possible bedbugs.  Evaluation in ED revealed hyponatremia, AKI, hyperglycemia, ?UTI and chest imaging with multiple cavitary lesions.  Underwent CT-guided biopsy on 12/1.  Pathology pending.  ID following.  Subjective 12/2: Patient seen and examined, awake laying in bed. Denies any complaints today, says her hip feels better, states it bothers her intermittently.  Says she has fear of falling so does not want to get up.  But states she is anxious to get to rehab and work with therapy.  No fevers/chills, N/V/D, chest pain or SOB.  No acute events reported overnight.   Assessment & Plan:   Principal Problem:   Hyponatremia Active Problems:   Diabetes mellitus type 2, uncontrolled (HCC)   Severe malnutrition (HCC)   Acute lower UTI   AKI (acute kidney injury) ([place])   Cavitary lung disease   Hypotension   Left groin pain   Anemia of chronic disease   Hip pain   Diarrhea   Cavitary Lesions in Left Lung Seen on non-contrast CT chest.  Differential includes TB vs atypical mycobacterium, fungal, malignancy, vasculitis, less likely bacterial lung abscesses or septic emboli given negative blood cultures and normal echo.  PPD negative.  Quantiferon Gold indeterminate.  Colleague discussed case with rheumatologist by phone, thought the ANA and other positive titers only slightly elevated and are nonspecific and recommended the biopsy for diagnosis. - AFB smear & culturtes pending - ID following - Pulmonary following - cont  Rocephin, Vancomycin and Flagyl for now - follow up biopsy path - unclear at this point if long-term antibiotics will be needed, if so - will need PICC line  Anemia of chronic disease - stable s/p RBC transfusion, no evidence of bleeding.  Hbg 8.8. - monitor CBC - transfuse if Hbg< 7.0 - continue iron supplement, folic acid  Hypotension - stable on midodrine 5 mg TID, continue for now.    Left groin/hip pain - resolved per patient.  Unclear if any hx of trauma or injury. Xray negative for fracture. - PT/OT  Hyponatremia - resolved  Acute kidney injury - improved  Type 2 diabetes, uncontrolled - Hbg A1C > 14%.    - Lantus 6 units qHS - sliding scale coverage, sensitive - monitor CBG's  Severe protein calorie malnutrition -  - dietician consulted - continue Ensure Max supplements  Stage II decubitus ulcer, right buttock - present on admission - Wound care - monitor closely - change position q2h  Chronic diarrhea - continue home loperamide  GERD - cont PPI   DVT prophylaxis: SCD's   Code Status: Full Code  Family Communication: none at bedside  Disposition Plan:  Pending clinical course, biopsy results, expect will need SNF, PT recommendations pending   Consultants:   Infectious Disease  Pulmonology  Procedures:   CT-guided biopsy 09/01/2019  Antimicrobials:   Flagyl, Rocephin, Vancomycin    Objective: Vitals:   09/01/19 1400 09/01/19 1819 09/01/19 2042 09/02/19 0457  BP: 114/75 110/67 124/60 114/65  Pulse:  79 79 81  Resp:  18 16  14  Temp:  97.7 F (36.5 C) 97.7 F (36.5 C) 98.3 F (36.8 C)  TempSrc:   Oral Oral  SpO2: 97% 100% 100% 99%  Weight:      Height:        Intake/Output Summary (Last 24 hours) at 09/02/2019 1155 Last data filed at 09/01/2019 1831 Gross per 24 hour  Intake 1520.17 ml  Output -  Net 1520.17 ml   Filed Weights   08/21/19 1317 08/22/19 0300  Weight: 41.7 kg 52.7 kg    Examination:  General exam: awake, alert, no  acute distress Respiratory system: clear to auscultation bilaterally, no wheezes, rales or rhonchi, normal respiratory effort. Cardiovascular system: normal S1/S2, RRR, no JVD, murmurs, rubs, gallops, no pedal edema.   Gastrointestinal system: soft, non-tender, non-distended abdomen, normal bowel sounds. Central nervous system: alert and oriented x3. no gross focal neurologic deficits, normal speech Extremities: moves all, no edema, normal tone Skin: dry, intact, normal temperature Psychiatry: normal mood, congruent affect    Data Reviewed: I have personally reviewed following labs and imaging studies  CBC: Recent Labs  Lab 08/28/19 1348 08/29/19 0556 08/30/19 0420 09/01/19 0528  WBC 9.2 10.6* 9.8 7.8  NEUTROABS 6.9  --   --   --   HGB 6.7* 6.9* 8.9* 8.8*  HCT 20.4* 21.9* 26.4* 26.0*  MCV 74.7* 79.1* 75.9* 76.0*  PLT 306 362 355 [id number]   Basic Metabolic Panel: Recent Labs  Lab 08/29/19 0556  NA 135  K 3.6  CL 105  CO2 22  GLUCOSE 129*  BUN 27*  CREATININE 0.95  CALCIUM 7.5*   GFR: Estimated Creatinine Clearance: 46.7 mL/min (by C-G formula based on SCr of 0.95 mg/dL). Liver Function Tests: No results for input(s): AST, ALT, ALKPHOS, BILITOT, PROT, ALBUMIN in the last 168 hours. Recent Labs  Lab 08/29/19 0556  LIPASE 19  AMYLASE 44   No results for input(s): AMMONIA in the last 168 hours. Coagulation Profile: Recent Labs  Lab 09/01/19 0528  INR 1.4*   Cardiac Enzymes: No results for input(s): CKTOTAL, CKMB, CKMBINDEX, TROPONINI in the last 168 hours. BNP (last 3 results) No results for input(s): PROBNP in the last 8760 hours. HbA1C: No results for input(s): HGBA1C in the last 72 hours. CBG: Recent Labs  Lab 09/01/19 1201 09/01/19 1645 09/01/19 2110 09/02/19 0920 09/02/19 1136  GLUCAP 141* 122* 169* 96 157*   Lipid Profile: No results for input(s): CHOL, HDL, LDLCALC, TRIG, CHOLHDL, LDLDIRECT in the last 72 hours. Thyroid Function Tests: No  results for input(s): TSH, T4TOTAL, FREET4, T3FREE, THYROIDAB in the last 72 hours. Anemia Panel: No results for input(s): VITAMINB12, FOLATE, FERRITIN, TIBC, IRON, RETICCTPCT in the last 72 hours. Sepsis Labs: No results for input(s): PROCALCITON, LATICACIDVEN in the last 168 hours.  Recent Results (from the past 240 hour(s))  Culture, expectorated sputum-assessment     Status: None   Collection Time: 08/24/19 11:25 AM   Specimen: Expectorated Sputum  Result Value Ref Range Status   Specimen Description EXPECTORATED SPUTUM  Final   Special Requests NONE  Final   Sputum evaluation   Final    Sputum specimen not acceptable for testing.  Please recollect.   CALLED TO ASHLEY RAMIREZ @1846  08/24/19 MJU Performed at [facility], [address] [place], [place] [address]    Report Status 08/24/2019 FINAL  Final  Aerobic/Anaerobic Culture (surgical/deep wound)     Status: None (Preliminary result)   Collection Time: 09/01/19 10:00 AM   Specimen: Abscess;  Tissue  Result Value Ref Range Status   Specimen Description   Final    ABSCESS Performed at [facility], [address] [place], [place] [address]    Special Requests   Final    LUNG Performed at [facility], [place], [place] [address]    Gram Stain NO WBC SEEN NO ORGANISMS SEEN   Final   Culture   Final    NO GROWTH < 24 HOURS Performed at [facility], [place] [address], [place], [place] [address]    Report Status PENDING  Incomplete         Radiology Studies: Ct Guided Needle Placement  Result Date: 09/01/2019 INDICATION: Multiple cavitary lesions. EXAM: CT-DIRECTED BIOPSY. MEDICATIONS: None. ANESTHESIA/SEDATION: None. FLUOROSCOPY TIME:  Fluoroscopy Time: None. COMPLICATIONS: None immediate. PROCEDURE: After discussing the risks and benefits of this procedure with the patient informed consent was obtained. Airborne disease precautions were taken. Right back was  sterilely prepped and draped. Following local anesthesia with 1% lidocaine a 18 gauge core biopsy system was advanced into the right posterior pleural-based lung mass without complication. Three separate core biopsies obtained, two 23 mm and one 13 mm, and sent to pathology for histologic evaluation. Two separate 13 mm samples obtained and mixed with saline and sent to microbiology for aerobic and anaerobic cultures and sensitivities, TB and fungal studies. Post biopsy CT was unremarkable. IMPRESSION: Successful CT-directed biopsy of right posterior lung mass in this patient with known multiple cavitary lesions. Multiple 18 gauge core samples sent to pathology for histologic and microbiologic evaluations. Electronically Signed   By: Marcello Moores  Register   On: 09/01/2019 14:26   Dg Chest Port 1 View  Result Date: 09/01/2019 CLINICAL DATA:  Lung mass biopsy. EXAM: PORTABLE CHEST 1 VIEW COMPARISON:  CT 08/30/2019.  Chest x-ray 08/28/2019. FINDINGS: Mediastinum and hilar structures are stable. Heart size stable. Multiple cavitary mass is again noted both lungs. No evidence of pneumothorax post biopsy. Small left pleural effusion. No acute bony abnormality. IMPRESSION: No evidence of pneumothorax post biopsy. Electronically Signed   By: Marcello Moores  Register   On: 09/01/2019 15:51        Scheduled Meds: . ferrous sulfate  325 mg Oral Q breakfast  . folic acid  1 mg Oral Daily  . insulin aspart  0-9 Units Subcutaneous TID WC  . insulin glargine  6 Units Subcutaneous QHS  . midodrine  5 mg Oral TID WC  . nystatin  5 mL Oral QID  . pantoprazole  40 mg Oral Daily  . Ensure Max Protein  11 oz Oral BID BM   Continuous Infusions: . sodium chloride 20 mL (09/01/19 2248)  . cefTRIAXone (ROCEPHIN)  IV Stopped (09/01/19 1559)  . metronidazole 500 mg (09/02/19 0514)  . vancomycin       LOS: 12 days    Time spent: 35-40 minutes    Ezekiel Slocumb, DO [facility] Pager: [phone]  If 7PM-7AM,  please contact night-coverage [email or web address] Password [facility] 09/02/2019, 11:55 AM

## 2019-09-02 NOTE — Progress Notes (Signed)
Date of Admission:  08/21/2019     ID: Julia Shea is a 63 y.o. 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 No hardware in her body Has 2 dogs and a cat. No animal bites Lived on her own  For the past 2 weeks before she came to the hospital she has been feeling weak and flopping to the floor as her legs were weak. Says left hip pain better    Medications:  . ferrous sulfate  325 mg Oral Q breakfast  . folic acid  1 mg Oral Daily  . insulin aspart  0-9 Units Subcutaneous TID WC  . insulin glargine  6 Units Subcutaneous QHS  . midodrine  5 mg Oral TID WC  . nystatin  5 mL Oral QID  . pantoprazole  40 mg Oral Daily  . Ensure Max Protein  11 oz Oral BID BM    Objective: Vital signs in last 24 hours: Temp:  [97.7 F (36.5 C)-98.3 F (36.8 C)] 98.3 F (36.8 C) (12/02 0457) Pulse Rate:  [79-82] 81 (12/02 0457) Resp:  [14-20] 14 (12/02 0457) BP: (110-132)/(60-76) 114/65 (12/02 0457) SpO2:  [97 %-100 %] 99 % (12/02 0457)  PHYSICAL EXAM:  General: Alert, cooperative, no distress, pale Very poor dentition-   Neck: Supple, symmetrical, no adenopathy, thyroid: non tender no carotid bruit and no JVD. Back: No CVA tenderness. Lungs: b/l air entry Heart: Regular rate and rhythm, no murmur, rub or gallop. Abdomen: Soft, non-tender,not distended. Bowel sounds normal. No masses Extremities: atraumatic, no cyanosis. No edema. No clubbing Skin: No rashes or lesions. Or bruising Lymph: Cervical, supraclavicular normal. Neurologic: Grossly  non-focal  Lab Results Recent Labs    09/01/19 0528  WBC 7.8  HGB 8.8*  HCT 26.0*   Liver Panel No results for input(s): PROT, ALBUMIN, AST, ALT, ALKPHOS, BILITOT, BILIDIR, IBILI in the last 72 hours. Sedimentation Rate No results for input(s): ESRSEDRATE in the last 72 hours. C-Reactive Protein No results for input(s): CRP in the last 72 hours.  Microbiology: Biopsy culture pending Studies/Results: Ct Guided Needle Placement  Result Date: 09/01/2019 INDICATION: Multiple cavitary lesions. EXAM: CT-DIRECTED BIOPSY. MEDICATIONS: None. ANESTHESIA/SEDATION: None. FLUOROSCOPY TIME:  Fluoroscopy Time: None. COMPLICATIONS: None immediate. PROCEDURE: After discussing the risks and benefits of this procedure with the patient informed consent was obtained. Airborne disease precautions were taken. Right back was sterilely prepped and draped. Following local anesthesia with 1% lidocaine a 18 gauge core biopsy system was advanced into the right posterior pleural-based lung mass without complication. Three separate core biopsies obtained, two 23 mm and one 13 mm, and sent to pathology for histologic evaluation. Two separate 13 mm samples obtained and mixed with saline and sent to microbiology for aerobic and anaerobic cultures and sensitivities, TB and fungal studies. Post biopsy CT was unremarkable. IMPRESSION: Successful CT-directed biopsy of right posterior lung mass in this patient with known multiple cavitary lesions. Multiple 18 gauge core samples sent to pathology for histologic and microbiologic evaluations. Electronically Signed  By: [place]   On: 09/01/2019 14:26   Dg Chest Port 1 View  Result Date: 09/01/2019 CLINICAL DATA:  Lung mass biopsy. EXAM: PORTABLE CHEST 1 VIEW COMPARISON:  CT 08/30/2019.  Chest x-ray 08/28/2019. FINDINGS: Mediastinum and hilar structures are stable. Heart size stable. Multiple cavitary mass is again noted both lungs. No evidence of pneumothorax post biopsy.  Small left pleural effusion. No acute bony abnormality. IMPRESSION: No evidence of pneumothorax post biopsy. Electronically Signed   By: Marcello Moores  Register   On: 09/01/2019 15:51     Assessment/Plan:  Multiple  cavitary and nodular infiltrates lungs- underwent IR biopsy- cultures pending- On triple antibiotics without no response radiologically Fungal antibodies- Blasto, aspergillus negative, PPD neg,  The histoplasma galcomammin test of 0.9 unclear significance. Need to r/o infective endocarditis causing septic emboli as she very poor dentition  Anemia  Malnutrition  Hyponatremia - resolved  Lactobacillus urine is likely contaminant  AKI resolved  Left hip pain improved-has restricted movt though patient will need intense PT  Discussed the management with patient and hospitalist

## 2019-09-03 DIAGNOSIS — T3695XA Adverse effect of unspecified systemic antibiotic, initial encounter: Secondary | ICD-10-CM

## 2019-09-03 DIAGNOSIS — D638 Anemia in other chronic diseases classified elsewhere: Secondary | ICD-10-CM

## 2019-09-03 DIAGNOSIS — K521 Toxic gastroenteritis and colitis: Secondary | ICD-10-CM

## 2019-09-03 DIAGNOSIS — J850 Gangrene and necrosis of lung: Secondary | ICD-10-CM

## 2019-09-03 LAB — BASIC METABOLIC PANEL
Anion gap: 7 (ref 5–15)
BUN: 13 mg/dL (ref 8–23)
CO2: 22 mmol/L (ref 22–32)
Calcium: 7.5 mg/dL — ABNORMAL LOW (ref 8.9–10.3)
Chloride: 107 mmol/L (ref 98–111)
Creatinine, Ser: 0.88 mg/dL (ref 0.44–1.00)
GFR calc Af Amer: >60 mL/min (ref >60)
GFR calc non Af Amer: >60 mL/min (ref >60)
Glucose, Bld: 129 mg/dL — ABNORMAL HIGH (ref 70–99)
Potassium: 2.4 mmol/L — CL (ref 3.5–5.1)
Sodium: 136 mmol/L (ref 135–145)

## 2019-09-03 LAB — GLUCOSE, CAPILLARY
Glucose-Capillary: 96 mg/dL (ref 70–99)
Glucose-Capillary: 95 mg/dL (ref 70–99)
Glucose-Capillary: 168 mg/dL — ABNORMAL HIGH (ref 70–99)
Glucose-Capillary: 139 mg/dL — ABNORMAL HIGH (ref 70–99)
Glucose-Capillary: 98 mg/dL (ref 70–99)

## 2019-09-03 LAB — CBC
HCT: 23.3 % — ABNORMAL LOW (ref 36.0–46.0)
Hemoglobin: 7.9 g/dL — ABNORMAL LOW (ref 12.0–15.0)
MCH: 25.6 pg — ABNORMAL LOW (ref 26.0–34.0)
MCHC: 33.9 g/dL (ref 30.0–36.0)
MCV: 75.6 fL — ABNORMAL LOW (ref 80.0–100.0)
Platelets: 187 10*3/uL (ref 150–400)
RBC: 3.08 MIL/uL — ABNORMAL LOW (ref 3.87–5.11)
RDW: 15 % (ref 11.5–15.5)
WBC: 7.6 10*3/uL (ref 4.0–10.5)
nRBC: 0 % (ref 0.0–0.2)

## 2019-09-03 LAB — POTASSIUM: Potassium: 4.6 mmol/L (ref 3.5–5.1)

## 2019-09-03 LAB — CRYPTOCOCCUS ANTIGEN, SERUM: Cryptococcus Antigen, Serum: NEGATIVE

## 2019-09-03 LAB — SURGICAL PATHOLOGY

## 2019-09-03 LAB — TOXOPLASMA ANTIBODIES- IGG AND  IGM
Toxoplasma Antibody- IgM: <3 AU/mL (ref 0.0–7.9)
Toxoplasma IgG Ratio: 12.5 IU/mL — ABNORMAL HIGH (ref 0.0–7.1)

## 2019-09-03 LAB — MAGNESIUM: Magnesium: 1.5 mg/dL — ABNORMAL LOW (ref 1.7–2.4)

## 2019-09-03 MED ORDER — RISAQUAD PO CAPS
1.0000 | ORAL_CAPSULE | Freq: Every day | ORAL | Status: DC
  Administered 2019-09-03 – 2019-09-09 (×6): 1 via ORAL
  Filled 2019-09-03 (×7): qty 1

## 2019-09-03 MED ORDER — MAGNESIUM SULFATE 2 GM/50ML IV SOLN
2.0000 g | Freq: Once | INTRAVENOUS | Status: AC
  Administered 2019-09-03: 2 g via INTRAVENOUS

## 2019-09-03 MED ORDER — VANCOMYCIN HCL 1.25 G IV SOLR
1250.0000 mg | INTRAVENOUS | Status: DC
  Administered 2019-09-03: 1250 mg via INTRAVENOUS
  Filled 2019-09-03 (×2): qty 1250

## 2019-09-03 MED ORDER — POTASSIUM CHLORIDE 20 MEQ PO PACK
40.0000 meq | PACK | ORAL | Status: AC
  Administered 2019-09-03 (×3): 40 meq via ORAL
  Filled 2019-09-03 (×3): qty 2

## 2019-09-03 MED ORDER — POTASSIUM CHLORIDE CRYS ER 20 MEQ PO TBCR: 40.0000 meq | EXTENDED_RELEASE_TABLET | Freq: Once | ORAL | Status: DC

## 2019-09-03 MED ORDER — ZINC OXIDE 40 % EX OINT
TOPICAL_OINTMENT | Freq: Three times a day (TID) | CUTANEOUS | Status: DC
  Administered 2019-09-03 – 2019-09-09 (×19): via TOPICAL
  Filled 2019-09-03 (×3): qty 113

## 2019-09-03 MED ORDER — MAGNESIUM SULFATE 2 GM/50ML IV SOLN
2.0000 g | Freq: Once | INTRAVENOUS | Status: AC
  Administered 2019-09-03: 2 g via INTRAVENOUS
  Filled 2019-09-03 (×2): qty 50

## 2019-09-03 MED ORDER — POTASSIUM CHLORIDE CRYS ER 20 MEQ PO TBCR
40.0000 meq | EXTENDED_RELEASE_TABLET | ORAL | Status: AC
  Administered 2019-09-03: 40 meq via ORAL
  Filled 2019-09-03: qty 2

## 2019-09-03 NOTE — Progress Notes (Signed)
PROGRESS NOTE    Julia Shea  [id number] DOB: Jun 28, 1956 DOA: 08/21/2019  PCP: Patient, No Pcp Per    LOS - 13   Brief Narrative:  63 y.o. female withhistory of asthma, bilateral hip pain, depression, hypertension, history of MRSA infection, insulin-dependent diabetes mellitus, CKD stage III.  Presented with N/V, generalized weakness and left hip pain x 1 week.  EMS were called for a welfare check by neighbors, reported finding her lying in feces, urine and possible bedbugs. Evaluation in ED revealed hyponatremia, AKI, hyperglycemia, ?UTI and chest imaging with multiple cavitary lesions.  Underwent CT-guided biopsy on 12/1.  Pathology pending.  ID following.  Subjective 12/3: Patient awake laying in bed.  No acute events reported overnight.  Reports ongoing diarrhea, not yet better with Immodium.  Denies fever/chills, chest pain, SOB, N/V or other acute complaints.  We discussed lung biopsy results and next steps.   Assessment & Plan:   Principal Problem:   Hyponatremia Active Problems:   Diabetes mellitus type 2, uncontrolled (HCC)   Severe malnutrition (HCC)   Acute lower UTI   AKI (acute kidney injury) ([place])   Cavitary lung disease   Hypotension   Left groin pain   Anemia of chronic disease   Hip pain   Diarrhea   Cavitary Lesions in Left Lung Seen on non-contrast CT chest.  Differential includes culture-negative infective endocarditis given very poor dentition, TB vs atypical mycobacterium, fungal, malignancy, vasculitis, Negative blood cultures and normal TTE.  PPD negative.  Quantiferon Gold indeterminate.  Colleague discussed case with rheumatologist by phone, thought the ANA and other positive titers only slightly elevated and are nonspecific and recommended the biopsy for diagnosis.   - AFB smear & culturtes pending - ID following - Pulmonary following - cont Rocephin, Vancomycin and Flagyl for now - follow up biopsy path - unclear at this point if long-term  antibiotics will be needed, if so - will need PICC line - TEE ordered to evaluate for possible IE  Anemia of chronic disease - stable s/p RBC transfusion, no evidence of bleeding.   - monitor CBC - transfuse if Hbg< 7.0 - continue iron supplement, folic acid  Hypotension - stable on midodrine 5 mg TID, continue for now.    Left groin/hip pain - resolved per patient.  Unclear if any hx of trauma or injury. Xray negative for fracture. - PT/OT  Hyponatremia - resolved  Acute kidney injury - improved  Type 2 diabetes, uncontrolled - Hbg A1C > 14%.    - Lantus 6 units qHS - sliding scale coverage, sensitive - monitor CBG's  Severe protein calorie malnutrition -  - dietician consulted - continue Ensure Max supplements  Stage II decubitus ulcer, right buttock - present on admission - Wound care - monitor closely - change position q2h  Chronic diarrhea - continue home loperamide  GERD - cont PPI   DVT prophylaxis: SCD's   Code Status: Full Code  Family Communication: none at bedside  Disposition Plan:  Pending clinical course, biopsy results, expect will need SNF, PT recommendations still pending   Consultants:   Infectious Disease  Pulmonology  Procedures:   CT-guided biopsy 09/01/2019  Antimicrobials:   Flagyl, Rocephin, Vancomycin    Objective: Vitals:   09/02/19 0457 09/02/19 1737 09/02/19 1934 09/03/19 0435  BP: 114/65 (!) 149/72 (!) 122/58 104/61  Pulse: 81 88 81 73  Resp: 14  15 16   Temp: 98.3 F (36.8 C)  98.2 F (36.8 C) (!) 97.5 F (36.4  C)  TempSrc: Oral  Oral Oral  SpO2: 99% 100% 98% 100%  Weight:      Height:        Intake/Output Summary (Last 24 hours) at 09/03/2019 0726 Last data filed at 09/03/2019 0444 Gross per 24 hour  Intake 300 ml  Output 1600 ml  Net -1300 ml   Filed Weights   08/21/19 1317 08/22/19 0300  Weight: 41.7 kg 52.7 kg    Examination:  General exam: awake, alert, no acute distress, frail  HEENT: moist mucus membranes, hearing grossly normal , poor dentition Respiratory system: clear to auscultation bilaterally, no wheezes, rales or rhonchi, normal respiratory effort. Cardiovascular system: normal S1/S2, RRR, no JVD, murmurs, rubs, gallops, no pedal edema.   Gastrointestinal system: soft, non-tender, non-distended abdomen, no organomegaly or masses felt, normal bowel sounds. Central nervous system: alert and oriented x3. no gross focal neurologic deficits, normal speech Extremities: moves all, no edema, normal tone Psychiatry: normal mood, congruent affect, judgement and insight appear normal    Data Reviewed: I have personally reviewed following labs and imaging studies  CBC: Recent Labs  Lab 08/28/19 1348 08/29/19 0556 08/30/19 0420 09/01/19 0528 09/03/19 0328  WBC 9.2 10.6* 9.8 7.8 7.6  NEUTROABS 6.9  --   --   --   --   HGB 6.7* 6.9* 8.9* 8.8* 7.9*  HCT 20.4* 21.9* 26.4* 26.0* 23.3*  MCV 74.7* 79.1* 75.9* 76.0* 75.6*  PLT 306 362 355 289 [id number]   Basic Metabolic Panel: Recent Labs  Lab 08/29/19 0556 09/03/19 0328  NA 135 136  K 3.6 2.4*  CL 105 107  CO2 22 22  GLUCOSE 129* 129*  BUN 27* 13  CREATININE 0.95 0.88  CALCIUM 7.5* 7.5*  MG  --  1.5*   GFR: Estimated Creatinine Clearance: 50.4 mL/min (by C-G formula based on SCr of 0.88 mg/dL). Liver Function Tests: No results for input(s): AST, ALT, ALKPHOS, BILITOT, PROT, ALBUMIN in the last 168 hours. Recent Labs  Lab 08/29/19 0556  LIPASE 19  AMYLASE 44   No results for input(s): AMMONIA in the last 168 hours. Coagulation Profile: Recent Labs  Lab 09/01/19 0528  INR 1.4*   Cardiac Enzymes: No results for input(s): CKTOTAL, CKMB, CKMBINDEX, TROPONINI in the last 168 hours. BNP (last 3 results) No results for input(s): PROBNP in the last 8760 hours. HbA1C: No results for input(s): HGBA1C in the last 72 hours. CBG: Recent Labs  Lab 09/01/19 1645 09/01/19 2110 09/02/19 0920 09/02/19  1136 09/02/19 1735  GLUCAP 122* 169* 96 157* 111*   Lipid Profile: No results for input(s): CHOL, HDL, LDLCALC, TRIG, CHOLHDL, LDLDIRECT in the last 72 hours. Thyroid Function Tests: No results for input(s): TSH, T4TOTAL, FREET4, T3FREE, THYROIDAB in the last 72 hours. Anemia Panel: No results for input(s): VITAMINB12, FOLATE, FERRITIN, TIBC, IRON, RETICCTPCT in the last 72 hours. Sepsis Labs: No results for input(s): PROCALCITON, LATICACIDVEN in the last 168 hours.  Recent Results (from the past 240 hour(s))  Culture, expectorated sputum-assessment     Status: None   Collection Time: 08/24/19 11:25 AM   Specimen: Expectorated Sputum  Result Value Ref Range Status   Specimen Description EXPECTORATED SPUTUM  Final   Special Requests NONE  Final   Sputum evaluation   Final    Sputum specimen not acceptable for testing.  Please recollect.   CALLED TO ASHLEY RAMIREZ @1846  08/24/19 MJU Performed at [facility], [address] [place], [place] [address]    Report Status  08/24/2019 FINAL  Final  Acid Fast Smear (AFB)     Status: None   Collection Time: 09/01/19 10:00 AM   Specimen: Tissue  Result Value Ref Range Status   AFB Specimen Processing Comment  Final    Comment: Tissue Grinding and Digestion/Decontamination   Acid Fast Smear Negative  Final    Comment: (NOTE) Performed At: [facility] [place], [place] [id number] Rush Farmer MD [id number]    Source (AFB) LUNG  Final    Comment: Performed at [facility], [address] [place], [place] [address]  Aerobic/Anaerobic Culture (surgical/deep wound)     Status: None (Preliminary result)   Collection Time: 09/01/19 10:00 AM   Specimen: Abscess; Tissue  Result Value Ref Range Status   Specimen Description   Final    ABSCESS Performed at [facility], [address] [place], [place] [address]    Special Requests   Final    LUNG Performed at [facility], [place], [place] [address]    Gram Stain NO WBC SEEN NO ORGANISMS SEEN   Final   Culture   Final    NO GROWTH < 24 HOURS Performed at [facility], [place] [address] [place], [place] [address]    Report Status PENDING  Incomplete         Radiology Studies: Ct Guided Needle Placement  Result Date: 09/01/2019 INDICATION: Multiple cavitary lesions. EXAM: CT-DIRECTED BIOPSY. MEDICATIONS: None. ANESTHESIA/SEDATION: None. FLUOROSCOPY TIME:  Fluoroscopy Time: None. COMPLICATIONS: None immediate. PROCEDURE: After discussing the risks and benefits of this procedure with the patient informed consent was obtained. Airborne disease precautions were taken. Right back was sterilely prepped and draped. Following local anesthesia with 1% lidocaine a 18 gauge core biopsy system was advanced into the right posterior pleural-based lung mass without complication. Three separate core biopsies obtained, two 23 mm and one 13 mm, and sent to pathology for histologic evaluation. Two separate 13 mm samples obtained and mixed with saline and sent to microbiology for aerobic and anaerobic cultures and sensitivities, TB and fungal studies. Post biopsy CT was unremarkable. IMPRESSION: Successful CT-directed biopsy of right posterior lung mass in this patient with known multiple cavitary lesions. Multiple 18 gauge core samples sent to pathology for histologic and microbiologic evaluations. Electronically Signed   By: Marcello Moores  Register   On: 09/01/2019 14:26   Dg Chest Port 1 View  Result Date: 09/01/2019 CLINICAL DATA:  Lung mass biopsy. EXAM: PORTABLE CHEST 1 VIEW COMPARISON:  CT 08/30/2019.  Chest x-ray 08/28/2019. FINDINGS: Mediastinum and hilar structures are stable. Heart size stable. Multiple cavitary mass is again noted both lungs. No evidence of pneumothorax post biopsy. Small left pleural effusion. No acute bony abnormality. IMPRESSION: No evidence of pneumothorax post biopsy.  Electronically Signed   By: Marcello Moores  Register   On: 09/01/2019 15:51        Scheduled Meds: . ferrous sulfate  325 mg Oral Q breakfast  . folic acid  1 mg Oral Daily  . insulin aspart  0-9 Units Subcutaneous TID WC  . insulin glargine  6 Units Subcutaneous QHS  . midodrine  5 mg Oral TID WC  . nystatin  5 mL Oral QID  . pantoprazole  40 mg Oral Daily  . potassium chloride  40 mEq Oral Q4H  . Ensure Max Protein  11 oz Oral BID BM   Continuous Infusions: . sodium chloride 20 mL (09/01/19 2248)  . cefTRIAXone (ROCEPHIN)  IV 2 g (09/02/19 1628)  . magnesium sulfate bolus IVPB    . magnesium sulfate bolus IVPB    . metronidazole 500 mg (09/03/19 0612)  . vancomycin Stopped (09/02/19 2320)     LOS: 13 days    Time spent: 30-35 minutes    Ezekiel Slocumb, DO [facility] Pager: [phone]  If 7PM-7AM, please contact night-coverage [email or web address] Password [email or web address] 09/03/2019, 7:26 AM

## 2019-09-03 NOTE — Consult Note (Signed)
Pharmacy Antibiotic Note  Julia Shea is a 63 y.o. female admitted on 08/21/2019 with pneumonia.  Pharmacy has been consulted for vancomycin dosing. Keep trough around 15 per Dr. Delaine Lame. Pt is currently on ceftriaxone and flagyl. Linezolid d/c'ed.   Plan: Vancomycin 1250 mg q24H for a Css min of 13.6 AUC of 570. Plan to get levels in 4-5 days.   Height: 5\' 2"  (157.5 cm) Weight: 116 lb 1.6 oz (52.7 kg) IBW/kg (Calculated) : 50.1  Temp (24hrs), Avg:97.9 F (36.6 C), Min:97.5 F (36.4 C), Max:98.2 F (36.8 C)  Recent Labs  Lab 08/28/19 1348 08/29/19 0556 08/30/19 0420 09/01/19 0528 09/03/19 0328  WBC 9.2 10.6* 9.8 7.8 7.6  CREATININE  --  0.95  --   --  0.88    Estimated Creatinine Clearance: 50.4 mL/min (by C-G formula based on SCr of 0.88 mg/dL).    Allergies  Allergen Reactions  . Penicillins     Has patient had a PCN reaction causing immediate rash, facial/tongue/throat swelling, SOB or lightheadedness with hypotension: Yes Has patient had a PCN reaction causing severe rash involving mucus membranes or skin necrosis: Yes Has patient had a PCN reaction that required hospitalization: Yes Has patient had a PCN reaction occurring within the last 10 years: Yes If all of the above answers are "NO", then may proceed with Cephalosporin use.  . Sulfa Antibiotics     Antimicrobials this admission: 11/27 ceftriaxone >>  11/27 flaygl >>  12/1 vancomycin >> 11/25 linezolid >>   Dose adjustments this admission: None  Microbiology results: 11/20 BCx: NGTD 11/20 UCx: Lactobacillus species   11/22 Sputum: not acceptable for testing.   11/21 MRSA PCR: positive 11/20 COVID negative.   Thank you for allowing pharmacy to be a part of this patient's care.  Rowland Lathe, PharmD 09/03/2019 8:02 AM

## 2019-09-03 NOTE — Progress Notes (Signed)
ID  Has loose stools- documented as type 6 X 3- says better with imodium No fever  No abdominal pain or bloating  No nausea  Patient Vitals for the past 24 hrs:  BP Temp Temp src Pulse Resp SpO2  09/03/19 1506 (!) 114/98 (!) 97.4 F (36.3 C) Oral 81 18 99 %  09/03/19 0820 (!) 141/76 97.6 F (36.4 C) Oral 72 18 100 %  09/03/19 0435 104/61 (!) 97.5 F (36.4 C) Oral 73 16 100 %  09/02/19 1934 (!) 122/58 98.2 F (36.8 C) Oral 81 15 98 %   O/E looks better Pale Chest b/l air entry, crepts present HSs1s2 Abd soft   CBC Latest Ref Rng & Units 09/03/2019 09/01/2019 08/30/2019  WBC 4.0 - 10.5 K/uL 7.6 7.8 9.8  Hemoglobin 12.0 - 15.0 g/dL 7.9(L) 8.8(L) 8.9(L)  Hematocrit 36.0 - 46.0 % 23.3(L) 26.0(L) 26.4(L)  Platelets 150 - 400 K/uL 187 289 355   CMP Latest Ref Rng & Units 09/03/2019 09/03/2019 08/29/2019  Glucose 70 - 99 mg/dL - 129(H) 129(H)  BUN 8 - 23 mg/dL - 13 27(H)  Creatinine 0.44 - 1.00 mg/dL - 0.88 0.95  Sodium 135 - 145 mmol/L - 136 135  Potassium 3.5 - 5.1 mmol/L 4.6 2.4(LL) 3.6  Chloride 98 - 111 mmol/L - 107 105  CO2 22 - 32 mmol/L - 22 22  Calcium 8.9 - 10.3 mg/dL - 7.5(L) 7.5(L)  Total Protein 6.5 - 8.1 g/dL - - -  Total Bilirubin 0.3 - 1.2 mg/dL - - -  Alkaline Phos 38 - 126 U/L - - -  AST 15 - 41 U/L - - -  ALT 0 - 44 U/L - - -    Lung biopsy- aerobic culture- NG so far AFB -negative smear Pathology LUNG NODULE, RIGHT; CT-GUIDED NEEDLE CORE BIOPSY:  - ACUTE AND ORGANIZING PNEUMONIA WITH FIBRINOUS ALVEOLITIS.  - FRAGMENTS OF NECROINFLAMMATORY DEBRIS.  - NEGATIVE FOR MALIGNANCY.  - SEE COMMENT.   Comment:  Sections demonstrate multiple fragments of necroinflammatory debris,  consistent with abscess. Fragments of lung parenchyma demonstrate plugs  of fibrin and inflammatory debris filling the alveolar spaces. There is  no evidence of granulomatous inflammation. The findings are most  consistent with an acute lung injury of infectious  etiology.  Special stains negative   Impression/Recommendation  Necrotizing cavitary and nodular infilttrates lungs- biopsy shows necrotic inflammation- culture so far neg- AFB neg Pt has been on antibiotics since admission -hence aerobic culture could be negative Will r/o endocarditis- need TEE DC airborne  Diarrhea relate to antibiotic/iron No clinical evidence of cdiff- also she has been on flagyl On imodium  Anemia of chronic disease  Discussed the management with the patient

## 2019-09-03 NOTE — Progress Notes (Signed)
OT Cancellation Note  Patient Details Name: Julia Shea MRN: [id number] DOB: 1955/12/04   Cancelled Treatment:    Reason Eval/Treat Not Completed: Other (comment);Patient declined, no reason specified. OT attempted to see pt this date for OT tx session. This Pryor Curia called pt room to determine a good time for the pt therapy session. Pt politely declines tx this date stating she has been "sick with a bowel movement all day". Will hold tx session at this time and re-attempt at a later date/time as available.   Shara Blazing, M.S., OTR/L Ascom: [phone] 09/03/19, 2:01 PM    09/03/2019, 1:59 PM

## 2019-09-03 NOTE — Consult Note (Signed)
Pharmacy Antibiotic Note  Julia Shea is a 63 y.o. female admitted on 08/21/2019 with pneumonia.  Pharmacy has been consulted for vancomycin dosing. Keep trough around 15 per Dr. Delaine Lame. Pt is currently on ceftriaxone and flagyl. Linezolid d/c'ed.   Plan: Vancomycin 1250 mg q24H for a Css min of 17.4 AUC of 712.5. Plan to get levels in 4-5 days.   Height: 5\' 2"  (157.5 cm) Weight: 116 lb 1.6 oz (52.7 kg) IBW/kg (Calculated) : 50.1  Temp (24hrs), Avg:97.8 F (36.6 C), Min:97.5 F (36.4 C), Max:98.2 F (36.8 C)  Recent Labs  Lab 08/28/19 1348 08/29/19 0556 08/30/19 0420 09/01/19 0528 09/03/19 0328  WBC 9.2 10.6* 9.8 7.8 7.6  CREATININE  --  0.95  --   --  0.88    Estimated Creatinine Clearance: 50.4 mL/min (by C-G formula based on SCr of 0.88 mg/dL).    Allergies  Allergen Reactions  . Penicillins     Has patient had a PCN reaction causing immediate rash, facial/tongue/throat swelling, SOB or lightheadedness with hypotension: Yes Has patient had a PCN reaction causing severe rash involving mucus membranes or skin necrosis: Yes Has patient had a PCN reaction that required hospitalization: Yes Has patient had a PCN reaction occurring within the last 10 years: Yes If all of the above answers are "NO", then may proceed with Cephalosporin use.  . Sulfa Antibiotics     Antimicrobials this admission: 11/27 ceftriaxone >>  11/27 flaygl >>  12/1 vancomycin >> 11/25 linezolid >>   Dose adjustments this admission: None  Microbiology results: 11/20 BCx: NGTD 11/20 UCx: Lactobacillus species   11/22 Sputum: not acceptable for testing.   11/21 MRSA PCR: positive 11/20 COVID negative.   Thank you for allowing pharmacy to be a part of this patient's care.  Rowland Lathe, PharmD 09/03/2019 1:03 PM

## 2019-09-04 LAB — GLUCOSE, CAPILLARY
Glucose-Capillary: 99 mg/dL (ref 70–99)
Glucose-Capillary: 154 mg/dL — ABNORMAL HIGH (ref 70–99)
Glucose-Capillary: 180 mg/dL — ABNORMAL HIGH (ref 70–99)
Glucose-Capillary: 175 mg/dL — ABNORMAL HIGH (ref 70–99)

## 2019-09-04 LAB — CBC WITH DIFFERENTIAL/PLATELET
Abs Immature Granulocytes: 0.03 10*3/uL (ref 0.00–0.07)
Basophils Absolute: 0.1 10*3/uL (ref 0.0–0.1)
Basophils Relative: 1 %
Eosinophils Absolute: 0.1 10*3/uL (ref 0.0–0.5)
Eosinophils Relative: 1 %
HCT: 25.5 % — ABNORMAL LOW (ref 36.0–46.0)
Hemoglobin: 8.4 g/dL — ABNORMAL LOW (ref 12.0–15.0)
Immature Granulocytes: 0 %
Lymphocytes Relative: 14 %
Lymphs Abs: 1.2 10*3/uL (ref 0.7–4.0)
MCH: 25.3 pg — ABNORMAL LOW (ref 26.0–34.0)
MCHC: 32.9 g/dL (ref 30.0–36.0)
MCV: 76.8 fL — ABNORMAL LOW (ref 80.0–100.0)
Monocytes Absolute: 0.5 10*3/uL (ref 0.1–1.0)
Monocytes Relative: 7 %
Neutro Abs: 6.4 10*3/uL (ref 1.7–7.7)
Neutrophils Relative %: 77 %
Platelets: 173 10*3/uL (ref 150–400)
RBC: 3.32 MIL/uL — ABNORMAL LOW (ref 3.87–5.11)
RDW: 14.8 % (ref 11.5–15.5)
WBC: 8.3 10*3/uL (ref 4.0–10.5)
nRBC: 0 % (ref 0.0–0.2)

## 2019-09-04 LAB — BASIC METABOLIC PANEL
Anion gap: 6 (ref 5–15)
BUN: 14 mg/dL (ref 8–23)
CO2: 22 mmol/L (ref 22–32)
Calcium: 7.6 mg/dL — ABNORMAL LOW (ref 8.9–10.3)
Chloride: 109 mmol/L (ref 98–111)
Creatinine, Ser: 0.83 mg/dL (ref 0.44–1.00)
GFR calc Af Amer: >60 mL/min (ref >60)
GFR calc non Af Amer: >60 mL/min (ref >60)
Glucose, Bld: 113 mg/dL — ABNORMAL HIGH (ref 70–99)
Potassium: 5.2 mmol/L — ABNORMAL HIGH (ref 3.5–5.1)
Sodium: 137 mmol/L (ref 135–145)

## 2019-09-04 LAB — Q FEVER ANTIBODIES, IGG
Q Fever Phase I: NEGATIVE
Q Fever Phase II: NEGATIVE

## 2019-09-04 LAB — BARTONELLA ANTIBODY PANEL
B Quintana IgM: NEGATIVE titer
B henselae IgG: NEGATIVE titer
B henselae IgM: NEGATIVE titer
B quintana IgG: NEGATIVE titer

## 2019-09-04 LAB — MAGNESIUM: Magnesium: 2.5 mg/dL — ABNORMAL HIGH (ref 1.7–2.4)

## 2019-09-04 LAB — POTASSIUM: Potassium: 4.3 mmol/L (ref 3.5–5.1)

## 2019-09-04 NOTE — Care Management Important Message (Signed)
Important Message  Patient Details  Name: Julia Shea MRN: [id number] Date of Birth: 1956/09/27   Medicare Important Message Given:  Yes     [name] 09/04/2019, 11:22 AM

## 2019-09-04 NOTE — Consult Note (Signed)
Pharmacy Antibiotic Note  Julia Shea is a 63 y.o. female admitted on 08/21/2019 with pneumonia.  Pharmacy has been consulted for vancomycin dosing. Keep trough around 15 per Dr. Delaine Lame. Pt is currently on ceftriaxone and flagyl. Linezolid d/c'ed.   Plan: Vancomycin 1250 mg q24H for a Css min of 15.9 AUC of 675.6. Plan to get levels in 4-5 days.   Height: 5\' 2"  (157.5 cm) Weight: 116 lb 1.6 oz (52.7 kg) IBW/kg (Calculated) : 50.1  Temp (24hrs), Avg:97.8 F (36.6 C), Min:97.4 F (36.3 C), Max:98.4 F (36.9 C)  Recent Labs  Lab 08/29/19 0556 08/30/19 0420 09/01/19 0528 09/03/19 0328 09/04/19 0342  WBC 10.6* 9.8 7.8 7.6 8.3  CREATININE 0.95  --   --  0.88 0.83    Estimated Creatinine Clearance: 53.4 mL/min (by C-G formula based on SCr of 0.83 mg/dL).    Allergies  Allergen Reactions  . Penicillins     Has patient had a PCN reaction causing immediate rash, facial/tongue/throat swelling, SOB or lightheadedness with hypotension: Yes Has patient had a PCN reaction causing severe rash involving mucus membranes or skin necrosis: Yes Has patient had a PCN reaction that required hospitalization: Yes Has patient had a PCN reaction occurring within the last 10 years: Yes If all of the above answers are "NO", then may proceed with Cephalosporin use.  . Sulfa Antibiotics     Antimicrobials this admission: 11/27 ceftriaxone >>  11/27 flaygl >>  12/1 vancomycin >> 11/25 linezolid >>   Dose adjustments this admission: None  Microbiology results: 11/20 BCx: NGTD 11/20 UCx: Lactobacillus species   11/22 Sputum: not acceptable for testing.   11/21 MRSA PCR: positive 11/20 COVID negative.   Thank you for allowing pharmacy to be a part of this patient's care.  Rowland Lathe, PharmD 09/04/2019 10:55 AM

## 2019-09-04 NOTE — Progress Notes (Signed)
PROGRESS NOTE    Julia Shea  [id number] DOB: [birth year] DOA: 08/21/2019  PCP: Patient, No Pcp Per    LOS - 14   Brief Narrative:  63 y.o. female withhistory of asthma, bilateral hip pain, depression, hypertension, history of MRSA infection, insulin-dependent diabetes mellitus, CKD stage III. Presented with N/V, generalized weakness and left hip pain x 1 week. EMS were called for a welfare check by neighbors, reported finding her lying in feces, urine and possible bedbugs.Evaluation in ED revealed hyponatremia, AKI, hyperglycemia, ?UTI and chest imaging with multiple cavitary lesions. Underwent CT-guided biopsy on 12/1. Pathology pending. ID following.  Subjective 12/4: Patient seen awake sitting up in bed.  Reports she is ready to get up and see if she is able to walk.  Expresses concern over weakness.  Denies fever/chills, N/V or other complaints.  Immodium has helped her diarrhea.  No acute events reported overnight.   Assessment & Plan:   Principal Problem:   Hyponatremia Active Problems:   Diabetes mellitus type 2, uncontrolled (HCC)   Severe malnutrition (HCC)   Acute lower UTI   AKI (acute kidney injury) ([place])   Cavitary lung disease   Hypotension   Left groin pain   Anemia of chronic disease   Hip pain   Diarrhea   Cavitary Lesions in Lung Seen on non-contrast CT chest. Differential includes culture-negative infective endocarditis given very poor dentition, TB vs atypical mycobacterium but AFB smear negative, fungal (so far negative studies), malignancy, vasculitis, Negative blood cultures and normal TTE. PPD negative. Quantiferon Gold indeterminate. Colleague discussedcasewith rheumatologistbyphone, thoughtthe ANA and other positive titers only slightly elevated and are nonspecific and recommended the biopsy for diagnosis.   - AFB culturtes pending (smear negative) - ID following - Pulmonary following - cont Rocephin, Vancomycin and Flagyl for now  - follow up biopsy path - unclear at this point if long-term antibiotics will be needed, if so - will need PICC line - TEE ordered to evaluate for possible IE  Anemia of chronic disease- stable s/p RBC transfusion, no evidence of bleeding.  - monitor CBC - transfuse if Hbg< 7.0 - continue iron supplement, folic acid  Hypotension- stable on midodrine 5 mg TID, continue for now.  Left groin/hip pain- resolved per patient. Unclear if any hx of trauma or injury. Xray negative for fracture. - PT/OT  Hyponatremia- resolved  Acute kidney injury- improved  Type 2 diabetes, uncontrolled- Hbg A1C >14%.  - Lantus 6 units qHS - sliding scale coverage, sensitive - monitor CBG's  Severe protein calorie malnutrition-  - dietician consulted - continue Ensure Max supplements  Stage II decubitus ulcer, right buttock- present on admission - Wound care - monitor closely - change position q2h  Chronic diarrhea - continue home loperamide  GERD - cont PPI   DVT prophylaxis:SCD's Code Status: Full Code Family Communication:daughter in law, by phone updated Disposition Plan:Pending clinical course, biopsy results, expect will need SNF, PT recommendations still pending   Consultants:  Infectious Disease  Pulmonology  Procedures:  CT-guided biopsy 09/01/2019  Antimicrobials:  Flagyl, Rocephin, Vancomycin   Objective: Vitals:   09/03/19 0435 09/03/19 0820 09/03/19 1506 09/03/19 2313  BP: 104/61 (!) 141/76 (!) 114/98 116/60  Pulse: 73 72 81 78  Resp: 16 18 18 17   Temp: (!) 97.5 F (36.4 C) 97.6 F (36.4 C) (!) 97.4 F (36.3 C) 98.4 F (36.9 C)  TempSrc: Oral Oral Oral   SpO2: 100% 100% 99% 99%  Weight:      Height:  Intake/Output Summary (Last 24 hours) at 09/04/2019 0719 Last data filed at 09/04/2019 0000 Gross per 24 hour  Intake 872.5 ml  Output 300 ml  Net 572.5 ml   Filed Weights   08/21/19 1317 08/22/19 0300   Weight: 41.7 kg 52.7 kg    Examination:  General exam: awake, alert, no acute distress, frail HEENT: very poor dentition, moist mucus membranes, hearing grossly normal  Respiratory system: clear to auscultation bilaterally, no wheezes, rales or rhonchi, normal respiratory effort. Cardiovascular system: normal S1/S2, RRR, no JVD, murmurs, rubs, gallops, no pedal edema.   Gastrointestinal system: soft, non-tender, non-distended abdomen Central nervous system: alert and oriented x4. no gross focal neurologic deficits, normal speech Extremities: moves all, no edema, normal tone Psychiatry: normal mood, congruent affect    Data Reviewed: I have personally reviewed following labs and imaging studies  CBC: Recent Labs  Lab 08/28/19 1348 08/29/19 0556 08/30/19 0420 09/01/19 0528 09/03/19 0328 09/04/19 0342  WBC 9.2 10.6* 9.8 7.8 7.6 8.3  NEUTROABS 6.9  --   --   --   --  6.4  HGB 6.7* 6.9* 8.9* 8.8* 7.9* 8.4*  HCT 20.4* 21.9* 26.4* 26.0* 23.3* 25.5*  MCV 74.7* 79.1* 75.9* 76.0* 75.6* 76.8*  PLT 306 362 355 289 187 [id number]   Basic Metabolic Panel: Recent Labs  Lab 08/29/19 0556 09/03/19 0328 09/03/19 1510 09/04/19 0342  NA 135 136  --  137  K 3.6 2.4* 4.6 5.2*  CL 105 107  --  109  CO2 22 22  --  22  GLUCOSE 129* 129*  --  113*  BUN 27* 13  --  14  CREATININE 0.95 0.88  --  0.83  CALCIUM 7.5* 7.5*  --  7.6*  MG  --  1.5*  --  2.5*   GFR: Estimated Creatinine Clearance: 53.4 mL/min (by C-G formula based on SCr of 0.83 mg/dL). Liver Function Tests: No results for input(s): AST, ALT, ALKPHOS, BILITOT, PROT, ALBUMIN in the last 168 hours. Recent Labs  Lab 08/29/19 0556  LIPASE 19  AMYLASE 44   No results for input(s): AMMONIA in the last 168 hours. Coagulation Profile: Recent Labs  Lab 09/01/19 0528  INR 1.4*   Cardiac Enzymes: No results for input(s): CKTOTAL, CKMB, CKMBINDEX, TROPONINI in the last 168 hours. BNP (last 3 results) No results for input(s): PROBNP  in the last 8760 hours. HbA1C: No results for input(s): HGBA1C in the last 72 hours. CBG: Recent Labs  Lab 09/02/19 2105 09/03/19 0822 09/03/19 1327 09/03/19 1754 09/03/19 2204  GLUCAP 96 95 168* 139* 98   Lipid Profile: No results for input(s): CHOL, HDL, LDLCALC, TRIG, CHOLHDL, LDLDIRECT in the last 72 hours. Thyroid Function Tests: No results for input(s): TSH, T4TOTAL, FREET4, T3FREE, THYROIDAB in the last 72 hours. Anemia Panel: No results for input(s): VITAMINB12, FOLATE, FERRITIN, TIBC, IRON, RETICCTPCT in the last 72 hours. Sepsis Labs: No results for input(s): PROCALCITON, LATICACIDVEN in the last 168 hours.  Recent Results (from the past 240 hour(s))  Acid Fast Smear (AFB)     Status: None   Collection Time: 09/01/19 10:00 AM   Specimen: Tissue  Result Value Ref Range Status   AFB Specimen Processing Comment  Final    Comment: Tissue Grinding and Digestion/Decontamination   Acid Fast Smear Negative  Final    Comment: (NOTE) Performed At: [facility] [address] [place], [place] [id number] Rush Farmer MD [id number]    Source (AFB) LUNG  Final  Comment: Performed at [facility], [address] [place], [place] [address]  Aerobic/Anaerobic Culture (surgical/deep wound)     Status: None (Preliminary result)   Collection Time: 09/01/19 10:00 AM   Specimen: Abscess; Tissue  Result Value Ref Range Status   Specimen Description   Final    ABSCESS Performed at [facility], [address] [place], [place] [address]    Special Requests   Final    LUNG Performed at [facility], [place], [place] [address]    Gram Stain NO WBC SEEN NO ORGANISMS SEEN   Final   Culture   Final    NO GROWTH 2 DAYS NO ANAEROBES ISOLATED; CULTURE IN PROGRESS FOR 5 DAYS Performed at [place] [address], [place], [place] [address]    Report Status PENDING  Incomplete         Radiology Studies: No  results found.      Scheduled Meds: . acidophilus  1 capsule Oral Daily  . ferrous sulfate  325 mg Oral Q breakfast  . folic acid  1 mg Oral Daily  . insulin aspart  0-9 Units Subcutaneous TID WC  . insulin glargine  6 Units Subcutaneous QHS  . liver oil-zinc oxide   Topical TID  . midodrine  5 mg Oral TID WC  . nystatin  5 mL Oral QID  . pantoprazole  40 mg Oral Daily  . Ensure Max Protein  11 oz Oral BID BM   Continuous Infusions: . sodium chloride 250 mL (09/04/19 0544)  . cefTRIAXone (ROCEPHIN)  IV Stopped (09/03/19 1915)  . metronidazole 500 mg (09/04/19 0545)  . vancomycin 166.7 mL/hr at 09/04/19 0000     LOS: 14 days    Time spent: 30-35 minutes    Ezekiel Slocumb, DO [facility] Pager: [phone]  If 7PM-7AM, please contact night-coverage [email or web address] Password [email or web address] 09/04/2019, 7:19 AM

## 2019-09-04 NOTE — Progress Notes (Signed)
P.t. saw pt today. Cont to have loose stools imodium given this pm. A/o. No distress.

## 2019-09-04 NOTE — Progress Notes (Signed)
SLP Cancellation Note  Patient Details Name: Julia Shea MRN: [id number] DOB: [birth year]   Cancelled treatment:       Reason Eval/Treat Not Completed: Patient at procedure or test/unavailable(chart reviewed; pt is NPO). Pt is NPO for a TEE. ST services will f/u tomorrow w/ toleration of diet. NSG denied any difficulties at this time.     [name], MS, CCC-SLP Watson,Katherine 09/04/2019, 12:48 PM

## 2019-09-04 NOTE — Progress Notes (Signed)
Occupational Therapy Treatment Patient Details Name: Julia Shea MRN: [id number] DOB: 03/30/1956 Today's Date: 09/04/2019    History of present illness Julia Shea is a 63 y.o. F with HTN, DM II, Depression, frequent falls, and asthma who was admitted 11/20 with N/V, generalized weakness and left hip pain. EMS were called for a welfare check by neighbors, reported finding her lying in feces, urine and possible bedbugs.    OT comments  Pt seen for OT tx this date. Pt pleasant and eager to participate. Pt performed sup>sit EOB with supervision and additional time/effort to perform. With fair sitting balance throughout, pt tolerated sitting EOB for grooming tasks with set up for low vision. Min-Mod A for combing through significant tangles/matting but pt able to comb out hair independently after tangles were removed. Pt verbalizes eager to get better so she can move to [facility] and live at an ALF so she can meet friends, learn how to garden, and do knitting/crocheting. Pt progressing towards goals, continues to benefit from skilled OT Services.    Follow Up Recommendations  SNF    Equipment Recommendations  3 in 1 bedside commode    Recommendations for Other Services      Precautions / Restrictions Precautions Precautions: Fall Precaution Comments: airborne precautions d/c'd Restrictions Weight Bearing Restrictions: No       Mobility Bed Mobility Overal bed mobility: Needs Assistance Bed Mobility: Supine to Sit;Sit to Supine     Supine to sit: Supervision Sit to supine: Supervision   General bed mobility comments: Min A to scoot up in bed, but otherwise supervision for the supine<>sit EOB  Transfers Overall transfer level: Needs assistance   Transfers: Lateral/Scoot Transfers          Lateral/Scoot Transfers: Min assist      Balance Overall balance assessment: Needs assistance Sitting-balance support: No upper extremity supported;Feet supported Sitting balance-Leahy  Scale: Fair                                     ADL either performed or assessed with clinical judgement   ADL Overall ADL's : Needs assistance/impaired     Grooming: Sitting;Set up;Supervision/safety;Moderate assistance;Oral care;Wash/dry face;Brushing hair Grooming Details (indicate cue type and reason): low vision 2/2 cataracts, set up with low vision to brush teeth, wash face, and comb hair; significant tangles/matting in hair, requiring Min-Mod A to get out                                     Vision Baseline Vision/History: Cataracts;Legally blind Patient Visual Report: No change from baseline     Perception     Praxis      Cognition Arousal/Alertness: Awake/alert Behavior During Therapy: WFL for tasks assessed/performed Overall Cognitive Status: Within Functional Limits for tasks assessed                                          Exercises     Shoulder Instructions       General Comments      Pertinent Vitals/ Pain       Pain Assessment: No/denies pain  Home Living  Prior Functioning/Environment              Frequency  Min 1X/week        Progress Toward Goals  OT Goals(current goals can now be found in the care plan section)  Progress towards OT goals: Progressing toward goals  Acute Rehab OT Goals Patient Stated Goal: get some rest and get better OT Goal Formulation: With patient Time For Goal Achievement: 09/08/19 Potential to Achieve Goals: Good  Plan Discharge plan remains appropriate;Frequency remains appropriate    Co-evaluation                 AM-PAC OT "6 Clicks" Daily Activity     Outcome Measure   Help from another person eating meals?: None Help from another person taking care of personal grooming?: A Little Help from another person toileting, which includes using toliet, bedpan, or urinal?: A Lot Help from  another person bathing (including washing, rinsing, drying)?: A Lot Help from another person to put on and taking off regular upper body clothing?: A Lot Help from another person to put on and taking off regular lower body clothing?: A Lot 6 Click Score: 15    End of Session    OT Visit Diagnosis: Other abnormalities of gait and mobility (R26.89);Repeated falls (R29.6);Muscle weakness (generalized) (M62.81)   Activity Tolerance Patient tolerated treatment well   Patient Left in bed;with call bell/phone within reach;with bed alarm set   Nurse Communication Other (comment)(nurse tech - pt requesting bath, needs new PureWick)        Time: [id number] OT Time Calculation (min): 26 min  Charges: OT General Charges $OT Visit: 1 Visit OT Treatments $Self Care/Home Management : 23-37 mins  Jeni Salles, MPH, MS, OTR/L ascom [phone] 09/04/19, 12:17 PM

## 2019-09-04 NOTE — Progress Notes (Signed)
Physical Therapy Treatment Patient Details Name: Julia Shea MRN: [id number] DOB: [birth year] Today's Date: 09/04/2019    History of Present Illness Julia Shea is a 63 y.o. F with HTN, DM II, Depression, frequent falls, and asthma who was admitted 11/20 with N/V, generalized weakness and left hip pain. EMS were called for a welfare check by neighbors, reported finding her lying in feces, urine and possible bedbugs.     PT Comments    Pt presented with deficits in strength, transfers, mobility, gait, balance, and activity tolerance.  Pt pleasant and motivated to participate throughout session but remained very limited functionally.  Pt required assist for both BLE and trunk control with bed mobility tasks and with max assistance was unable to come to standing at EOB. Pt participated without adverse symptoms throughout the session.  Pt will benefit from PT services in a SNF setting upon discharge to safely address above deficits for decreased caregiver assistance and eventual return to PLOF.     Follow Up Recommendations  SNF     Equipment Recommendations  Rolling walker with 5" wheels    Recommendations for Other Services       Precautions / Restrictions Precautions Precautions: Fall Restrictions Weight Bearing Restrictions: No    Mobility  Bed Mobility Overal bed mobility: Needs Assistance Bed Mobility: Supine to Sit;Sit to Supine     Supine to sit: Min assist Sit to supine: Min assist   General bed mobility comments: Min A for BLEs in/out of bed and for trunk to full upright positioning  Transfers                 General transfer comment: Multiple attempts to stand from various bed heights and max A with pt unable to clear the surface of the mattress  Ambulation/Gait             General Gait Details: unable   Stairs             Wheelchair Mobility    Modified Rankin (Stroke Patients Only)       Balance Overall balance assessment: Needs  assistance Sitting-balance support: No upper extremity supported;Feet supported Sitting balance-Leahy Scale: Fair         Standing balance comment: Unable                            Cognition Arousal/Alertness: Awake/alert Behavior During Therapy: WFL for tasks assessed/performed Overall Cognitive Status: Within Functional Limits for tasks assessed                                        Exercises Total Joint Exercises Ankle Circles/Pumps: Strengthening;Both;10 reps;15 reps Quad Sets: Strengthening;Both;10 reps;15 reps Gluteal Sets: Strengthening;Both;10 reps Heel Slides: AROM;Both;10 reps Hip ABduction/ADduction: AAROM;AROM;Both;10 reps Straight Leg Raises: AROM;AAROM;Both;10 reps Long Arc Quad: Strengthening;10 reps;Both;15 reps Knee Flexion: Strengthening;Both;10 reps;15 reps Other Exercises Other Exercises: Isometric sit to stand attempts with pt pushing into floor with full force with 1 sec holds 2 x 5    General Comments        Pertinent Vitals/Pain Pain Assessment: No/denies pain    Home Living                      Prior Function            PT Goals (current goals can now be  found in the care plan section) Progress towards PT goals: Not progressing toward goals - comment(Limited by functional weakness)    Frequency    Min 2X/week      PT Plan Current plan remains appropriate    Co-evaluation              AM-PAC PT "6 Clicks" Mobility   Outcome Measure  Help needed turning from your back to your side while in a flat bed without using bedrails?: A Little Help needed moving from lying on your back to sitting on the side of a flat bed without using bedrails?: A Little Help needed moving to and from a bed to a chair (including a wheelchair)?: Total Help needed standing up from a chair using your arms (e.g., wheelchair or bedside chair)?: Total Help needed to walk in hospital room?: Total Help needed climbing  3-5 steps with a railing? : Total 6 Click Score: 10    End of Session Equipment Utilized During Treatment: Gait belt Activity Tolerance: Patient tolerated treatment well Patient left: in bed;with bed alarm set;with call bell/phone within reach Nurse Communication: Mobility status PT Visit Diagnosis: Muscle weakness (generalized) (M62.81);History of falling (Z91.81);Difficulty in walking, not elsewhere classified (R26.2);Pain Pain - Right/Left: Left Pain - part of body: Hip     Time: [id number] PT Time Calculation (min) (ACUTE ONLY): 26 min  Charges:  $Therapeutic Exercise: 8-22 mins $Therapeutic Activity: 8-22 mins                     D. Scott Taylin Mans PT, DPT 09/04/19, 4:51 PM

## 2019-09-04 NOTE — Plan of Care (Signed)

## 2019-09-04 NOTE — Progress Notes (Signed)
   09/04/19 1000  Clinical Encounter Type  Visited With Patient  Visit Type Follow-up;Psychological support;Spiritual support  Spiritual Encounters  Spiritual Needs Prayer;Emotional  Stress Factors  Patient Stress Factors Health changes  Ch followed up on the pt. Pt was sitting up in a chair finishing up her breakfast. Pt was excited about today as she will try sitting in a chair after having bedridden for a long time. Contrary to the last phone call visit, pt was very positive, enthused, and content. Pt shared about her close relationship with her brother and also about her grandmother who she looks up to. Pt also shared her hopes of the future where she will go to rehab and continue on recovery. She looks forward to taking a walk, hearing the birds chirp, using her phone again and so many other things. She said she was able to learn a lot about herself and see the real important things in this life through this hospitalization. Ch affirmed her of her insights and encouraged her to stay motivated. The visit was ended with a prayer.

## 2019-09-05 LAB — HISTOPLASMA ANTIGEN, URINE: Histoplasma Antigen, urine: <0.5 (ref <0.5)

## 2019-09-05 LAB — CBC
HCT: 24.6 % — ABNORMAL LOW (ref 36.0–46.0)
Hemoglobin: 8.1 g/dL — ABNORMAL LOW (ref 12.0–15.0)
MCH: 25.5 pg — ABNORMAL LOW (ref 26.0–34.0)
MCHC: 32.9 g/dL (ref 30.0–36.0)
MCV: 77.4 fL — ABNORMAL LOW (ref 80.0–100.0)
Platelets: 162 10*3/uL (ref 150–400)
RBC: 3.18 MIL/uL — ABNORMAL LOW (ref 3.87–5.11)
RDW: 15 % (ref 11.5–15.5)
WBC: 7.3 10*3/uL (ref 4.0–10.5)
nRBC: 0 % (ref 0.0–0.2)

## 2019-09-05 LAB — BASIC METABOLIC PANEL
Anion gap: 5 (ref 5–15)
BUN: 16 mg/dL (ref 8–23)
CO2: 23 mmol/L (ref 22–32)
Calcium: 7.7 mg/dL — ABNORMAL LOW (ref 8.9–10.3)
Chloride: 108 mmol/L (ref 98–111)
Creatinine, Ser: 0.85 mg/dL (ref 0.44–1.00)
GFR calc Af Amer: >60 mL/min (ref >60)
GFR calc non Af Amer: >60 mL/min (ref >60)
Glucose, Bld: 148 mg/dL — ABNORMAL HIGH (ref 70–99)
Potassium: 4 mmol/L (ref 3.5–5.1)
Sodium: 136 mmol/L (ref 135–145)

## 2019-09-05 LAB — GLUCOSE, CAPILLARY
Glucose-Capillary: 142 mg/dL — ABNORMAL HIGH (ref 70–99)
Glucose-Capillary: 133 mg/dL — ABNORMAL HIGH (ref 70–99)
Glucose-Capillary: 184 mg/dL — ABNORMAL HIGH (ref 70–99)
Glucose-Capillary: 224 mg/dL — ABNORMAL HIGH (ref 70–99)

## 2019-09-05 LAB — MAGNESIUM: Magnesium: 1.9 mg/dL (ref 1.7–2.4)

## 2019-09-05 NOTE — Progress Notes (Signed)
PROGRESS NOTE    Julia Shea  [id number] DOB: 14-Apr-1956 DOA: 08/21/2019  PCP: Patient, No Pcp Per    LOS - 15   Brief Narrative:  63 y.o. female withhistory of asthma, bilateral hip pain, depression, hypertension, history of MRSA infection, insulin-dependent diabetes mellitus, CKD stage III. Presented with N/V, generalized weakness and left hip pain x 1 week. EMS were called for a welfare check by neighbors, reported finding her lying in feces, urine and possible bedbugs.Evaluation in ED revealed hyponatremia, AKI, hyperglycemia, ?UTI and chest imaging with multiple cavitary lesions. Underwent CT-guided biopsy on 12/1. Pathology pending. ID following.  TEE pending for evaluate for infective endocarditis.  Subjective 12/5: Patient resting comfortably when seen and examined.  No acute events reported overnight.  Denies pain or discomfort, fevers or chills.  Assessment & Plan:   Principal Problem:   Hyponatremia Active Problems:   Diabetes mellitus type 2, uncontrolled (HCC)   Severe malnutrition (HCC)   Acute lower UTI   AKI (acute kidney injury) ([place])   Cavitary lung disease   Hypotension   Left groin pain   Anemia of chronic disease   Hip pain   Diarrhea   Cavitary Lesions in Lung Seen on non-contrast CT chest. Differential includesculture-negative infective endocarditis given very poor dentition,TB vs atypical mycobacterium but AFB smear negative, fungal (so far negative studies), malignancy, vasculitis, Negative blood cultures and normalTTE. PPD negative. Quantiferon Gold indeterminate. Colleague discussedcasewith rheumatologistbyphone, thoughtthe ANA and other positive titers only slightly elevated and are nonspecific and recommended the biopsy for diagnosis.  - AFB cultures pending (smear negative) - ID following - Pulmonary following - cont Rocephin, Vancomycin and Flagyl for now - follow up biopsy path - unclear at this point if long-term  antibiotics will be needed, if so - will need PICC line - TEE ordered to evaluate for possible IE - likely Mon 12/7  Generalized Weakness - per family report, patient has essentially been unable to ambulate for about 2 years, unable to care for herself but moved out from living with family, has frequent falls.  Now further deconditioned due to current illness. --PT/OT -recommend SNF --fall precautions  Anemia of chronic disease- stable s/p RBC transfusion, no evidence of bleeding.  - monitor CBC - transfuse if Hbg< 7.0 - continue iron supplement, folic acid  Hypotension- stable on midodrine 5 mg TID, continue for now.  Left groin/hip pain- resolved per patient. Unclear if any hx of trauma or injury. Xray negative for fracture. - PT/OT  Hyponatremia- resolved  Acute kidney injury- improved  Type 2 diabetes, uncontrolled- Hbg A1C >14%.  - Lantus 6 units qHS - sliding scale coverage, sensitive - monitor CBG's  Severe protein calorie malnutrition-  - dietician consulted - continue Ensure Max supplements  Stage II decubitus ulcer, right buttock- present on admission - Wound care - monitor closely - change position q2h  Chronic diarrhea - continue home loperamide  GERD - cont PPI   DVT prophylaxis:SCD's Code Status: Full Code Family Communication:daughter in law, by phone updated Disposition Plan:Pending clinical course, biopsy results, expect will need SNF, PT recommendationsstillpending   Consultants:  Infectious Disease  Pulmonology  Procedures:  CT-guided biopsy 09/01/2019  Antimicrobials:  Flagyl, Rocephin, Vancomycin    Objective: Vitals:   09/03/19 1506 09/03/19 2313 09/04/19 0740 09/04/19 2354  BP: (!) 114/98 116/60 (!) 106/59 123/71  Pulse: 81 78 73 77  Resp: 18 17 14 18   Temp: (!) 97.4 F (36.3 C) 98.4 F (36.9 C) 97.6 F (36.4  C) 98.1 F (36.7 C)  TempSrc: Oral   Oral  SpO2: 99% 99% 97% 99%   Weight:      Height:        Intake/Output Summary (Last 24 hours) at 09/05/2019 0752 Last data filed at 09/04/2019 1830 Gross per 24 hour  Intake 240 ml  Output 1100 ml  Net -860 ml   Filed Weights   08/21/19 1317 08/22/19 0300  Weight: 41.7 kg 52.7 kg    Examination:  General exam: awake, alert, no acute distress Respiratory system: clear to auscultation bilaterally, no wheezes, rales or rhonchi, normal respiratory effort. Cardiovascular system: normal S1/S2, RRR, no JVD, murmurs, rubs, gallops, no pedal edema.   Gastrointestinal system: soft, non-tender, non-distended abdomen   Data Reviewed: I have personally reviewed following labs and imaging studies  CBC: Recent Labs  Lab 08/30/19 0420 09/01/19 0528 09/03/19 0328 09/04/19 0342 09/05/19 0400  WBC 9.8 7.8 7.6 8.3 7.3  NEUTROABS  --   --   --  6.4  --   HGB 8.9* 8.8* 7.9* 8.4* 8.1*  HCT 26.4* 26.0* 23.3* 25.5* 24.6*  MCV 75.9* 76.0* 75.6* 76.8* 77.4*  PLT 355 289 187 173 0000000   Basic Metabolic Panel: Recent Labs  Lab 09/03/19 0328 09/03/19 1510 09/04/19 0342 09/04/19 0904 09/05/19 0400  NA 136  --  137  --  136  K 2.4* 4.6 5.2* 4.3 4.0  CL 107  --  109  --  108  CO2 22  --  22  --  23  GLUCOSE 129*  --  113*  --  148*  BUN 13  --  14  --  16  CREATININE 0.88  --  0.83  --  0.85  CALCIUM 7.5*  --  7.6*  --  7.7*  MG 1.5*  --  2.5*  --  1.9   GFR: Estimated Creatinine Clearance: 52.2 mL/min (by C-G formula based on SCr of 0.85 mg/dL). Liver Function Tests: No results for input(s): AST, ALT, ALKPHOS, BILITOT, PROT, ALBUMIN in the last 168 hours. No results for input(s): LIPASE, AMYLASE in the last 168 hours. No results for input(s): AMMONIA in the last 168 hours. Coagulation Profile: Recent Labs  Lab 09/01/19 0528  INR 1.4*   Cardiac Enzymes: No results for input(s): CKTOTAL, CKMB, CKMBINDEX, TROPONINI in the last 168 hours. BNP (last 3 results) No results for input(s): PROBNP in the last 8760  hours. HbA1C: No results for input(s): HGBA1C in the last 72 hours. CBG: Recent Labs  Lab 09/03/19 2204 09/04/19 0736 09/04/19 1216 09/04/19 1806 09/04/19 2050  GLUCAP 98 99 154* 180* 175*   Lipid Profile: No results for input(s): CHOL, HDL, LDLCALC, TRIG, CHOLHDL, LDLDIRECT in the last 72 hours. Thyroid Function Tests: No results for input(s): TSH, T4TOTAL, FREET4, T3FREE, THYROIDAB in the last 72 hours. Anemia Panel: No results for input(s): VITAMINB12, FOLATE, FERRITIN, TIBC, IRON, RETICCTPCT in the last 72 hours. Sepsis Labs: No results for input(s): PROCALCITON, LATICACIDVEN in the last 168 hours.  Recent Results (from the past 240 hour(s))  Acid Fast Smear (AFB)     Status: None   Collection Time: 09/01/19 10:00 AM   Specimen: Tissue  Result Value Ref Range Status   AFB Specimen Processing Comment  Final    Comment: Tissue Grinding and Digestion/Decontamination   Acid Fast Smear Negative  Final    Comment: (NOTE) Performed At: [facility] [address] [place], [place] [id number] Rush Farmer MD [id number]  Source (AFB) LUNG  Final    Comment: Performed at [facility], [address] [place], [place] [address]  Culture, fungus without smear     Status: None (Preliminary result)   Collection Time: 09/01/19 10:00 AM   Specimen: Abscess; Other  Result Value Ref Range Status   Specimen Description   Final    ABSCESS Performed at [facility], [address] [place], [place] [address]    Special Requests   Final    LUNG Performed at [facility], [address], [place], [place] [address]    Culture   Final    NO FUNGUS ISOLATED AFTER 2 DAYS Performed at [facility], [place] [address] [place], [place] [address]    Report Status PENDING  Incomplete  Aerobic/Anaerobic Culture (surgical/deep wound)     Status: None (Preliminary result)   Collection Time: 09/01/19 10:00 AM   Specimen: Abscess; Tissue  Result  Value Ref Range Status   Specimen Description   Final    ABSCESS Performed at [facility], [address], [place], [place] [address]    Special Requests   Final    LUNG Performed at [facility], [place], [place] [address]    Gram Stain NO WBC SEEN NO ORGANISMS SEEN   Final   Culture   Final    NO GROWTH 3 DAYS NO ANAEROBES ISOLATED; CULTURE IN PROGRESS FOR 5 DAYS Performed at [facility] [address], [place],  [address]    Report Status PENDING  Incomplete         Radiology Studies: No results found.      Scheduled Meds: . acidophilus  1 capsule Oral Daily  . ferrous sulfate  325 mg Oral Q breakfast  . folic acid  1 mg Oral Daily  . insulin aspart  0-9 Units Subcutaneous TID WC  . insulin glargine  6 Units Subcutaneous QHS  . liver oil-zinc oxide   Topical TID  . midodrine  5 mg Oral TID WC  . nystatin  5 mL Oral QID  . pantoprazole  40 mg Oral Daily  . Ensure Max Protein  11 oz Oral BID BM   Continuous Infusions: . sodium chloride 250 mL (09/05/19 [id number])  . cefTRIAXone (ROCEPHIN)  IV Stopped (09/04/19 2318)  . metronidazole 500 mg (09/05/19 0634)     LOS: 15 days    Time spent: 15-20 minutes    Ezekiel Slocumb, DO [facility] Pager: [phone]  If 7PM-7AM, please contact night-coverage [email or web address] Password [email or web address] 09/05/2019, 7:52 AM

## 2019-09-06 LAB — GLUCOSE, CAPILLARY
Glucose-Capillary: 216 mg/dL — ABNORMAL HIGH (ref 70–99)
Glucose-Capillary: 246 mg/dL — ABNORMAL HIGH (ref 70–99)
Glucose-Capillary: 231 mg/dL — ABNORMAL HIGH (ref 70–99)
Glucose-Capillary: 208 mg/dL — ABNORMAL HIGH (ref 70–99)

## 2019-09-06 LAB — AEROBIC/ANAEROBIC CULTURE W GRAM STAIN (SURGICAL/DEEP WOUND)
Culture: NO GROWTH
Gram Stain: NONE SEEN

## 2019-09-06 NOTE — Progress Notes (Signed)
Transferred to 120. No distress noted. Pt request notify brother in the morning of her transfer.

## 2019-09-06 NOTE — Progress Notes (Signed)
Giving report to !C nurse to transfer patient to room 120

## 2019-09-06 NOTE — Progress Notes (Signed)
PROGRESS NOTE    Abegail Mccalvin  [id number] DOB: 1956/07/25 DOA: 08/21/2019  PCP: Patient, No Pcp Per    LOS - 16   Brief Narrative:  63 y.o. female withhistory of asthma, bilateral hip pain, depression, hypertension, history of MRSA infection, insulin-dependent diabetes mellitus, CKD stage III. Presented with N/V, generalized weakness and left hip pain x 1 week. EMS were called for a welfare check by neighbors, reported finding her lying in feces, urine and possible bedbugs.Evaluation in ED revealed hyponatremia, AKI, hyperglycemia, ?UTI and chest imaging with multiple cavitary lesions. Underwent CT-guided biopsy on 12/1. 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  ID following.  TEE pending for evaluate for infective endocarditis.  Subjective 12/6: Patient awake laying in bed today.  No acute complaints.  No acute events overnight.  Still having loose stool.  No fever, chills, trouble breathing.  Asks about procedure tomorrow, states ready to have it done so she can stop worrying about it.  Assessment & Plan:   Principal Problem:   Hyponatremia Active Problems:   Diabetes mellitus type 2, uncontrolled (HCC)   Severe malnutrition (HCC)   Acute lower UTI   AKI (acute kidney injury) ([place])   Cavitary lung disease   Hypotension   Left groin pain   Anemia of chronic disease   Hip pain   Diarrhea   Cavitary Lesions in Lung Seen on non-contrast CT chest. Differential includesculture-negative infective endocarditis given very poor dentition,TB vs atypical mycobacteriumbut AFB smear negative, fungal(so far negative studies), malignancy, vasculitis, Negative blood cultures and normalTTE. PPD negative. Quantiferon Gold indeterminate. Colleague discussedcasewith rheumatologistbyphone, thoughtthe ANA and other positive titers only slightly elevated and are  nonspecific and recommended the biopsy for diagnosis.  - AFB cultures pending(smear negative) - ID following - Pulmonary following - cont Rocephin, Vancomycin and Flagyl for now - follow up biopsy path - unclear at this point if long-term antibiotics will be needed, if so - will need PICC line - TEE orderedto evaluate for possible IE - likely Mon 12/7  Generalized Weakness - per family report, patient has essentially been unable to ambulate for about 2 years, unable to care for herself but moved out from living with family, has frequent falls.  Now further deconditioned due to current illness. --PT/OT - recommend SNF --fall precautions  Anemia of chronic disease- stable s/p RBC transfusion, no evidence of bleeding.  - monitor CBC - transfuse if Hbg< 7.0 - continue iron supplement, folic acid  Hypotension- stable on midodrine 5 mg TID, continue for now.  Left groin/hip pain- resolved per patient. Unclear if any hx of trauma or injury. Xray negative for fracture. - PT/OT  Hyponatremia- resolved  Acute kidney injury- improved  Type 2 diabetes, uncontrolled- Hbg A1C >14%.  - Lantus 6 units qHS - sliding scale coverage, sensitive - monitor CBG's  Severe protein calorie malnutrition-  - dietician consulted - continue Ensure Max supplements  Stage II decubitus ulcer, right buttock- present on admission - Wound care - monitor closely - change position q2h  Chronic diarrhea - continue home loperamide  GERD - cont PPI   DVT prophylaxis:SCD's Code Status: Full Code Family Communication:brother, updated by phone  Disposition Plan:Pending clinical course, biopsy results, expect will need SNF, PT recommendationsstillpending   Consultants:  Infectious Disease  Pulmonology  Procedures:  CT-guided biopsy 09/01/2019  Antimicrobials:  Flagyl, Rocephin, Vancomycin    Objective: Vitals:   09/04/19 0740 09/04/19 2354  09/05/19  1556 09/05/19 2339  BP: (!) 106/59 123/71 128/61 136/74  Pulse: 73 77 79 89  Resp: 14 18 16 16   Temp: 97.6 F (36.4 C) 98.1 F (36.7 C) (!) 97.3 F (36.3 C) 98.2 F (36.8 C)  TempSrc:  Oral Oral Oral  SpO2: 97% 99% 100% 99%  Weight:      Height:        Intake/Output Summary (Last 24 hours) at 09/06/2019 0743 Last data filed at 09/06/2019 0616 Gross per 24 hour  Intake 1920.95 ml  Output 1150 ml  Net 770.95 ml   Filed Weights   08/21/19 1317 08/22/19 0300  Weight: 41.7 kg 52.7 kg    Examination:  General exam: awake, alert, no acute distress  Respiratory system: clear to auscultation bilaterally, no wheezes, rales or rhonchi, normal respiratory effort. Cardiovascular system: normal S1/S2, RRR, no JVD, murmurs, rubs, gallops, no pedal edema.   Central nervous system: alert and oriented x4. no gross focal neurologic deficits, normal speech Extremities: moves all, no edema, normal tone    Data Reviewed: I have personally reviewed following labs and imaging studies  CBC: Recent Labs  Lab 09/01/19 0528 09/03/19 0328 09/04/19 0342 09/05/19 0400  WBC 7.8 7.6 8.3 7.3  NEUTROABS  --   --  6.4  --   HGB 8.8* 7.9* 8.4* 8.1*  HCT 26.0* 23.3* 25.5* 24.6*  MCV 76.0* 75.6* 76.8* 77.4*  PLT 289 187 173 0000000   Basic Metabolic Panel: Recent Labs  Lab 09/03/19 0328 09/03/19 1510 09/04/19 0342 09/04/19 0904 09/05/19 0400  NA 136  --  137  --  136  K 2.4* 4.6 5.2* 4.3 4.0  CL 107  --  109  --  108  CO2 22  --  22  --  23  GLUCOSE 129*  --  113*  --  148*  BUN 13  --  14  --  16  CREATININE 0.88  --  0.83  --  0.85  CALCIUM 7.5*  --  7.6*  --  7.7*  MG 1.5*  --  2.5*  --  1.9   GFR: Estimated Creatinine Clearance: 52.2 mL/min (by C-G formula based on SCr of 0.85 mg/dL). Liver Function Tests: No results for input(s): AST, ALT, ALKPHOS, BILITOT, PROT, ALBUMIN in the last 168 hours. No results for input(s): LIPASE, AMYLASE in the last 168 hours. No results for  input(s): AMMONIA in the last 168 hours. Coagulation Profile: Recent Labs  Lab 09/01/19 0528  INR 1.4*   Cardiac Enzymes: No results for input(s): CKTOTAL, CKMB, CKMBINDEX, TROPONINI in the last 168 hours. BNP (last 3 results) No results for input(s): PROBNP in the last 8760 hours. HbA1C: No results for input(s): HGBA1C in the last 72 hours. CBG: Recent Labs  Lab 09/04/19 2050 09/05/19 0855 09/05/19 1258 09/05/19 1656 09/05/19 2055  GLUCAP 175* 142* 133* 184* 224*   Lipid Profile: No results for input(s): CHOL, HDL, LDLCALC, TRIG, CHOLHDL, LDLDIRECT in the last 72 hours. Thyroid Function Tests: No results for input(s): TSH, T4TOTAL, FREET4, T3FREE, THYROIDAB in the last 72 hours. Anemia Panel: No results for input(s): VITAMINB12, FOLATE, FERRITIN, TIBC, IRON, RETICCTPCT in the last 72 hours. Sepsis Labs: No results for input(s): PROCALCITON, LATICACIDVEN in the last 168 hours.  Recent Results (from the past 240 hour(s))  Acid Fast Smear (AFB)     Status: None   Collection Time: 09/01/19 10:00 AM   Specimen: Tissue  Result Value Ref Range Status   AFB Specimen Processing  Comment  Final    Comment: Tissue Grinding and Digestion/Decontamination   Acid Fast Smear Negative  Final    Comment: (NOTE) Performed At: [facility] [place], [place] [id number] Rush Farmer MD [id number]    Source (AFB) LUNG  Final    Comment: Performed at [facility], [address] [place], [place] [address]  Culture, fungus without smear     Status: None (Preliminary result)   Collection Time: 09/01/19 10:00 AM   Specimen: Abscess; Other  Result Value Ref Range Status   Specimen Description   Final    ABSCESS Performed at [facility], [address] [place], [place] [address]    Special Requests   Final    LUNG Performed at [facility], [address], [place], [place] [address]    Culture   Final    NO FUNGUS ISOLATED  AFTER 2 DAYS Performed at [facility], [place] [address], [place], [place] [address]    Report Status PENDING  Incomplete  Aerobic/Anaerobic Culture (surgical/deep wound)     Status: None (Preliminary result)   Collection Time: 09/01/19 10:00 AM   Specimen: Abscess; Tissue  Result Value Ref Range Status   Specimen Description   Final    ABSCESS Performed at [facility], [address], [place], [place] [address]    Special Requests   Final    LUNG Performed at [facility], [place], [place] [address]    Gram Stain NO WBC SEEN NO ORGANISMS SEEN   Final   Culture   Final    NO GROWTH 4 DAYS NO ANAEROBES ISOLATED; CULTURE IN PROGRESS FOR 5 DAYS Performed at [facility], [address] [place], [place] [address]    Report Status PENDING  Incomplete         Radiology Studies: No results found.      Scheduled Meds:  acidophilus  1 capsule Oral Daily   ferrous sulfate  325 mg Oral Q breakfast   folic acid  1 mg Oral Daily   insulin aspart  0-9 Units Subcutaneous TID WC   insulin glargine  6 Units Subcutaneous QHS   liver oil-zinc oxide   Topical TID   midodrine  5 mg Oral TID WC   nystatin  5 mL Oral QID   pantoprazole  40 mg Oral Daily   Ensure Max Protein  11 oz Oral BID BM   Continuous Infusions:  sodium chloride Stopped (09/05/19 1620)   cefTRIAXone (ROCEPHIN)  IV Stopped (09/05/19 1527)   metronidazole 500 mg (09/06/19 0538)     LOS: 16 days    Time spent: 20 minutes    Ezekiel Slocumb, DO [facility] Pager: [phone]  If 7PM-7AM, please contact night-coverage [email or web address] Password [facility] 09/06/2019, 7:43 AM

## 2019-09-07 ENCOUNTER — Inpatient Hospital Stay
Admit: 2019-09-07 | Discharge: 2019-09-07 | Disposition: A | Discharge status: Home / Self Care | Payer: Medicare Other | Attending: Cardiology | Admitting: Cardiology

## 2019-09-07 ENCOUNTER — Encounter
Admission: EM | Disposition: A | Discharge status: Skilled Nursing Facility | Payer: Self-pay | Source: Home / Self Care | Attending: Internal Medicine

## 2019-09-07 HISTORY — PX: TEE WITHOUT CARDIOVERSION: SHX5443

## 2019-09-07 LAB — GLUCOSE, CAPILLARY
Glucose-Capillary: 143 mg/dL — ABNORMAL HIGH (ref 70–99)
Glucose-Capillary: 171 mg/dL — ABNORMAL HIGH (ref 70–99)
Glucose-Capillary: 130 mg/dL — ABNORMAL HIGH (ref 70–99)
Glucose-Capillary: 129 mg/dL — ABNORMAL HIGH (ref 70–99)
Glucose-Capillary: 201 mg/dL — ABNORMAL HIGH (ref 70–99)

## 2019-09-07 SURGERY — ECHOCARDIOGRAM, TRANSESOPHAGEAL
Anesthesia: Moderate Sedation

## 2019-09-07 MED ORDER — GERHARDT'S BUTT CREAM
TOPICAL_CREAM | Freq: Three times a day (TID) | CUTANEOUS | Status: DC
  Administered 2019-09-07 – 2019-09-09 (×7): via TOPICAL
  Filled 2019-09-07: qty 1

## 2019-09-07 MED ORDER — SODIUM CHLORIDE 0.9 % IV SOLN
INTRAVENOUS | Status: DC
  Administered 2019-09-07: 12:00:00 via INTRAVENOUS

## 2019-09-07 MED ORDER — FENTANYL CITRATE (PF) 100 MCG/2ML IJ SOLN
INTRAMUSCULAR | Status: AC
  Administered 2019-09-07: 12:00:00
  Filled 2019-09-07: qty 2

## 2019-09-07 MED ORDER — SODIUM CHLORIDE FLUSH 0.9 % IV SOLN
INTRAVENOUS | Status: AC
  Administered 2019-09-07: 12:00:00
  Filled 2019-09-07: qty 10

## 2019-09-07 MED ORDER — MIDAZOLAM HCL 5 MG/5ML IJ SOLN
INTRAMUSCULAR | Status: AC
  Administered 2019-09-07: 12:00:00
  Filled 2019-09-07: qty 5

## 2019-09-07 MED ORDER — INSULIN ASPART 100 UNIT/ML ~~LOC~~ SOLN
4.0000 [IU] | Freq: Three times a day (TID) | SUBCUTANEOUS | Status: DC
  Administered 2019-09-07 – 2019-09-09 (×7): 4 [IU] via SUBCUTANEOUS
  Filled 2019-09-07 (×7): qty 1

## 2019-09-07 MED ORDER — FENTANYL CITRATE (PF) 100 MCG/2ML IJ SOLN
INTRAMUSCULAR | Status: AC | PRN
  Administered 2019-09-07: 50 ug via INTRAVENOUS

## 2019-09-07 MED ORDER — BUTAMBEN-TETRACAINE-BENZOCAINE 2-2-14 % EX AERO
INHALATION_SPRAY | CUTANEOUS | Status: AC
  Administered 2019-09-07: 13:00:00
  Filled 2019-09-07: qty 5

## 2019-09-07 MED ORDER — LIDOCAINE VISCOUS HCL 2 % MT SOLN
OROMUCOSAL | Status: AC
  Administered 2019-09-07: 13:00:00
  Filled 2019-09-07: qty 15

## 2019-09-07 MED ORDER — MIDAZOLAM HCL 5 MG/5ML IJ SOLN
INTRAMUSCULAR | Status: AC | PRN
  Administered 2019-09-07: 2 mg via INTRAVENOUS

## 2019-09-07 NOTE — Progress Notes (Signed)
   Date of Admission:  08/21/2019        Subjective: Had TEE today and is sleeping  Patient Vitals for the past 24 hrs:  BP Temp Temp src Pulse Resp SpO2 Height Weight  09/07/19 1727 102/68 97.8 F (36.6 C) Oral (!) 58 - 94 % - -  09/07/19 1722 102/68 97.8 F (36.6 C) Oral 81 - 97 % - -  09/07/19 1330 96/61 - - 73 12 97 % - -  09/07/19 1315 (!) 89/59 - - 66 13 96 % - -  09/07/19 1300 (!) 103/54 - - 66 19 96 % - -  09/07/19 1256 (!) 111/58 - - 68 15 99 % - -  09/07/19 1250 112/65 - - 67 12 97 % - -  09/07/19 1244 - - - 81 16 100 % - -  09/07/19 1240 133/77 - - 82 (!) 22 100 % - -  09/07/19 1235 138/79 - - 76 20 100 % - -  09/07/19 1219 135/70 97.8 F (36.6 C) Oral 74 19 100 % 5\' 2"  (1.575 m) 52.7 kg  09/07/19 0534 (!) 127/56 98.3 F (36.8 C) Oral 78 18 97 % - -    Medications:  . acidophilus  1 capsule Oral Daily  . ferrous sulfate  325 mg Oral Q breakfast  . folic acid  1 mg Oral Daily  . Gerhardt's butt cream   Topical TID  . insulin aspart  0-9 Units Subcutaneous TID WC  . insulin aspart  4 Units Subcutaneous TID WC  . insulin glargine  6 Units Subcutaneous QHS  . liver oil-zinc oxide   Topical TID  . midodrine  5 mg Oral TID WC  . nystatin  5 mL Oral QID  . pantoprazole  40 mg Oral Daily  . Ensure Max Protein  11 oz Oral BID BM    Lab Results Recent Labs    09/05/19 0400  WBC 7.3  HGB 8.1*  HCT 24.6*  NA 136  K 4.0  CL 108  CO2 23  BUN 16  CREATININE 0.85   Microbiology: 09/01/19 lung biopsy-aerobic culture neg, anerobic culture neg AFB neg Fungus neg so far  HIV neg Bartonella neg Q fever neg Toxoplasma IgG positive at 12.5 HIV NR Histoplasma antigen <0.5, Histoplasma galactomannin 0.9 ( pos) Fungal antibodies neg Cryptococcus neg PPD neg   Pathology of the lung tissue Sections demonstrate multiple fragments of necroinflammatory debris, consistent with abscess. Fragments of lung parenchyma demonstrate plugs of fibrin and inflammatory  debris filling the alveolar spaces. There is no evidence of granulomatous inflammation. The findings are most consistent with an acute lung injury of infectious etiology.     Assessment/Plan:  Necrotizing cavitary and nodular infilttrates lungs- biopsy shows necrotic inflammation- culture so far neg- AFB neg TEE neg for vegetation Pt has been on antibiotics since admission - Will check CXR PA/LAt tomorrow to see improvement on Ceftriaxone and flagyl Toxoplasma IgG at 12 positive- if no improvement in CXR with antibiotics need to pursue this further. will ask pathologist to do special stains for toxoplasma   Anemia of chronic disease

## 2019-09-07 NOTE — Progress Notes (Signed)
*  PRELIMINARY RESULTS* Echocardiogram Echocardiogram Transesophageal has been performed.  Sherrie Sport 09/07/2019, 1:43 PM

## 2019-09-07 NOTE — Progress Notes (Signed)
Nutrition Follow-up  DOCUMENTATION CODES:   Not applicable  INTERVENTION:  Continue Ensure Max Protein po BID, each supplement provides 150 kcal and 30 grams of protein. Patient prefers vanilla.  NUTRITION DIAGNOSIS:   Inadequate oral intake related to decreased appetite as evidenced by per patient/family report.  Resolving - po now 100%.  GOAL:   Patient will meet greater than or equal to 90% of their needs  Progressing.  MONITOR:   PO intake, Supplement acceptance, Diet advancement, Labs, Weight trends, Skin, I & O's  REASON FOR ASSESSMENT:   Malnutrition Screening Tool    ASSESSMENT:   63 year old female with PMHx of HTN, depression, asthma, DM admitted after being found down by neighbors and found to have cavitary lesions in left lung undergoing work-up, UTI, hypotension, N/V, AKI, hyperglycemia, anemia, possible left breast mass.  Patient out of room for procedure. Patient has been NPO for procedure but previously had remained on dysphagia 2 diet with thin liquids. She has been eating 100% of all three meals daily according to chart. Patient appears to be drinking 1-2 bottles of Ensure Max daily.  Medications reviewed and include: acidophilus 1 capsule daily, ferrous sulfate tablet [id number] mg daily, folic acid 1 mg daily, Novolog 0-9 units TID, Novolog 4 units TID, Lantus 6 units QHS, pantoprazole, ceftriaxone, Flagyl.  Labs reviewed: CBG 171-246.  Diet Order:   Diet Order            Diet NPO time specified  Diet effective now             EDUCATION NEEDS:   No education needs have been identified at this time  Skin:  Skin Assessment: Skin Integrity Issues:(wound buttocks (1cm x 0.9cm))  Last BM:  09/07/2019 - large type 6  Height:   Ht Readings from Last 1 Encounters:  09/07/19 5\' 2"  (1.575 m)   Weight:   Wt Readings from Last 1 Encounters:  09/07/19 52.7 kg   Ideal Body Weight:  50 kg  BMI:  Body mass index is 21.25 kg/m.  Estimated Nutritional  Needs:   Kcal:  1400-1600  Protein:  70-80 grams  Fluid:  1.4-1.6 L/day  Jacklynn Barnacle, MS, RD, LDN Office: [phone] Pager: [phone] After Hours/Weekend Pager: [phone]

## 2019-09-07 NOTE — Progress Notes (Signed)
Physical Therapy Treatment Patient Details Name: Julia Shea MRN: [id number] DOB: June 21, 1956 Today's Date: 09/07/2019    History of Present Illness Julia Shea is a 63 y.o. F with HTN, DM II, Depression, frequent falls, and asthma who was admitted 11/20 with N/V, generalized weakness and left hip pain. EMS were called for a welfare check by neighbors, reported finding her lying in feces, urine and possible bedbugs.     PT Comments    Pt presented with deficits in strength, B knee ext ROM, and activity tolerance.  Pt declined sitting at the EOB this session secondary to anxiety regarding upcoming procedure but agreed to bed therex.  Pt participated actively with below therex with no adverse symptoms noted.  Importance of positioning and HEP to promote B knee ext ROM reinforced with patient.  Pt will benefit from PT services in a SNF setting upon discharge to safely address above deficits for decreased caregiver assistance and eventual return to PLOF.    Follow Up Recommendations  SNF     Equipment Recommendations  Rolling walker with 5" wheels    Recommendations for Other Services       Precautions / Restrictions Precautions Precautions: Fall Restrictions Weight Bearing Restrictions: No    Mobility  Bed Mobility               General bed mobility comments: Pt anxious regarding upcoming procedure and declined to sit at EOB this session  Transfers                    Ambulation/Gait                 Stairs             Wheelchair Mobility    Modified Rankin (Stroke Patients Only)       Balance                                            Cognition Arousal/Alertness: Awake/alert Behavior During Therapy: WFL for tasks assessed/performed Overall Cognitive Status: Within Functional Limits for tasks assessed                                        Exercises Total Joint Exercises Ankle Circles/Pumps:  Strengthening;Both;10 reps;15 reps Quad Sets: Strengthening;Both;10 reps;15 reps;AROM Gluteal Sets: Strengthening;Both;10 reps;15 reps Towel Squeeze: Strengthening;Both;10 reps Heel Slides: AROM;Both;10 reps Hip ABduction/ADduction: AAROM;AROM;Both;10 reps;15 reps Straight Leg Raises: AROM;AAROM;Both;10 reps;15 reps Other Exercises: Bridges x 10 with low amplitude Other Exercises: Supine B leg press with manual resistance 2 x 10 Other Exercises: Positioning education/review with emphasis on BLE QS x 10 every 1-2 hours to prevent B knee flex contractures    General Comments        Pertinent Vitals/Pain Pain Assessment: No/denies pain    Home Living                      Prior Function            PT Goals (current goals can now be found in the care plan section)      Frequency    Min 2X/week      PT Plan Current plan remains appropriate    Co-evaluation  AM-PAC PT "6 Clicks" Mobility   Outcome Measure  Help needed turning from your back to your side while in a flat bed without using bedrails?: A Little Help needed moving from lying on your back to sitting on the side of a flat bed without using bedrails?: A Little Help needed moving to and from a bed to a chair (including a wheelchair)?: Total Help needed standing up from a chair using your arms (e.g., wheelchair or bedside chair)?: Total Help needed to walk in hospital room?: Total Help needed climbing 3-5 steps with a railing? : Total 6 Click Score: 10    End of Session   Activity Tolerance: Patient tolerated treatment well Patient left: in bed;with bed alarm set;with call bell/phone within reach Nurse Communication: Mobility status;Other (comment)(IV alarm sounding, nsg notified) PT Visit Diagnosis: Muscle weakness (generalized) (M62.81);History of falling (Z91.81);Difficulty in walking, not elsewhere classified (R26.2);Pain Pain - Right/Left: Left Pain - part of body: Hip      Time: [id number] PT Time Calculation (min) (ACUTE ONLY): 26 min  Charges:  $Therapeutic Exercise: 23-37 mins                     D. Scott Quientin Jent PT, DPT 09/07/19, 12:07 PM

## 2019-09-07 NOTE — CV Procedure (Signed)
   TRANSESOPHAGEAL ECHOCARDIOGRAM   NAME:  Julia Shea   MRN: [id number] DOB:  Mar 18, 1956   ADMIT DATE: 08/21/2019  INDICATIONS: Endocarditis  PROCEDURE:   Informed consent was obtained prior to the procedure. The risks, benefits and alternatives for the procedure were discussed and the patient comprehended these risks.  Risks include, but are not limited to, cough, sore throat, vomiting, nausea, somnolence, esophageal and stomach trauma or perforation, bleeding, low blood pressure, aspiration, pneumonia, infection, trauma to the teeth and death.    After a procedural time-out, the patient was given 2 mg versed and 50 mcg fentanyl to achieve moderate sedation for 8 min.  The oropharynx was anesthetized 3 cc of topical 1% viscous lidocaine.  The transesophageal probe was inserted in the esophagus and stomach without difficulty and multiple views were obtained. I was present for the entire procedure.    COMPLICATIONS:    There were no immediate complications.  FINDINGS:  LEFT VENTRICLE: EF = 55%. No regional wall motion abnormalities.  RIGHT VENTRICLE: Normal size and function.   LEFT ATRIUM: Normal. No thrombi  LEFT ATRIAL APPENDAGE: No thrombus.   RIGHT ATRIUM: Normal  AORTIC VALVE:  [place]. No vegetations  Mild to moderate ai  MITRAL VALVE:    Normal. Mild MR. No vegetations  TRICUSPID VALVE: Normal.No vegetations  PULMONIC VALVE: Grossly normal.  INTERATRIAL SEPTUM: No PFO or ASD. Agitated saline contrast was used.   PERICARDIUM: No effusion  DESCENDING AORTA:  Normal CONCLUSION: Normal LV funciton. No vegetations.

## 2019-09-07 NOTE — TOC Progression Note (Signed)
Transition of Care [facility]) - Progression Note    Patient Details  Name: Julia Shea MRN: [id number] Date of Birth: [birth year]  Transition of Care [facility]) CM/SW Contact  Shelbie Hutching, RN Phone Number: 09/07/2019, 3:56 PM  Clinical Narrative:    Patient's sister in law called back and requested that patient go to [facility]  [facility] and still accept patient.  Plan for discharge to [facility] tomorrow.    Expected Discharge Plan: [place] Barriers to Discharge: Continued Medical Work up  Expected Discharge Plan and Services Expected Discharge Plan: [place] In-house Referral: Clinical Social Work Discharge Planning Services: CM Consult Post Acute Care Choice: [place] Living arrangements for the past 2 months: Apartment                                       Social Determinants of Health (SDOH) Interventions    Readmission Risk Interventions No flowsheet data found.

## 2019-09-07 NOTE — Progress Notes (Addendum)
PROGRESS NOTE    Julia Shea  [id number] DOB: 10-17-1955 DOA: 08/21/2019  PCP: Patient, No Pcp Per    LOS - 17   Brief Narrative:  63 y.o. female withhistory of asthma, bilateral hip pain, depression, hypertension, history of MRSA infection, insulin-dependent diabetes mellitus, CKD stage III. Presented with N/V, generalized weakness and left hip pain x 1 week. EMS were called for a welfare check by neighbors, reported finding her lying in feces, urine and possible bedbugs.Evaluation in ED revealed hyponatremia, AKI, hyperglycemia, ?UTI and chest imaging with multiple cavitary lesions. Underwent CT-guided biopsy on 12/1. 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  ID following.TEE pending for evaluate for infective endocarditis.  Subjective 12/7: Patient laying in fetal position crying this AM.  Says "get me out of here", "I just want to eat".  Patient NPO for TEE today.  She perseverates on these issues, does not report any other acute complaints.  No events reported overnight.  Assessment & Plan:   Principal Problem:   Hyponatremia Active Problems:   Diabetes mellitus type 2, uncontrolled (HCC)   Severe malnutrition (HCC)   Acute lower UTI   AKI (acute kidney injury) ([place])   Cavitary lung disease   Hypotension   Left groin pain   Anemia of chronic disease   Hip pain   Diarrhea   Cavitary Lesions in Lung Seen on non-contrast CT chest. Differential includesculture-negative infective endocarditis given very poor dentition,TB vs atypical mycobacteriumbut AFB smear negative, fungal(so far negative studies), malignancy, vasculitis, Negative blood cultures and normalTTE. PPD negative. Quantiferon Gold indeterminate. Colleague discussedcasewith rheumatologistbyphone, thoughtthe ANA and other positive titers only slightly elevated and are nonspecific and  recommended the biopsy for diagnosis.  - AFB cultures pending(smear negative) - ID following - Pulmonary following - cont Rocephin, Vancomycin and Flagyl for now - follow up biopsy path - unclear at this point if long-term antibiotics will be needed, if so - will need PICC line - TEE to be done today  Generalized Weakness - per family report, patient has essentially been unable to ambulate for about 2 years, unable to care for herself but moved out from living with family, has frequent falls. Now further deconditioned due to current illness. --PT/OT- recommend SNF --fall precautions  Anemia of chronic disease- stable s/p RBC transfusion, no evidence of bleeding.  - monitor CBC - transfuse if Hbg< 7.0 - continue iron supplement, folic acid  Hypotension- stable on midodrine 5 mg TID, continue for now.  Left groin/hip pain- resolved per patient. Unclear if any hx of trauma or injury. Xray negative for fracture. - PT/OT  Hyponatremia- resolved  Acute kidney injury- improved  Type 2 diabetes, uncontrolled- Hbg A1C >14%.  - Lantus 6 units qHS - sliding scale coverage, sensitive - added Novolog 4 units TID WC scheduled in addition to SSI - monitor CBG's  Severe protein calorie malnutrition-  - dietician consulted - continue Ensure Max supplements  Stage II decubitus ulcer, right buttock- present on admission - Wound care - monitor closely - change position q2h  Chronic diarrhea - continue home loperamide as needed  GERD - cont PPI   DVT prophylaxis:SCD's Code Status: Full Code Family Communication:brother, updated by phone  Disposition Plan:Expect medically clear for d/c after TEE and ID follow up.  PT recommends SNF.  Covid test pending.   Consultants:  Infectious Disease  Pulmonology  Procedures:  CT-guided biopsy 09/01/2019  TEE 09/07/2019  Antimicrobials:  Flagyl, Rocephin, Vancomycin      Objective: Vitals:   09/06/19 0819 09/06/19 0907 09/06/19 1711 09/07/19 0534  BP: 136/77  113/65 (!) 127/56  Pulse: 83  80 78  Resp: 16  16 18   Temp:  98.1 F (36.7 C) 99.3 F (37.4 C) 98.3 F (36.8 C)  TempSrc:  Oral Oral Oral  SpO2: 100%  96% 97%  Weight:      Height:        Intake/Output Summary (Last 24 hours) at 09/07/2019 0744 Last data filed at 09/06/2019 1900 Gross per 24 hour  Intake 1272.3 ml  Output 650 ml  Net 622.3 ml   Filed Weights   08/21/19 1317 08/22/19 0300  Weight: 41.7 kg 52.7 kg    Examination:  General exam: awake, alert, no acute distress, chronically ill appearing, underweight Respiratory system: clear to auscultation bilaterally, no wheezes, rales or rhonchi, normal respiratory effort. Cardiovascular system: normal S1/S2, RRR, no JVD, murmurs, rubs, gallops, no pedal edema.   Gastrointestinal system: soft, non-tender, non-distended abdomen Psychiatry: depressed mood, congruent affect, abnormal judgement and insight    Data Reviewed: I have personally reviewed following labs and imaging studies  CBC: Recent Labs  Lab 09/01/19 0528 09/03/19 0328 09/04/19 0342 09/05/19 0400  WBC 7.8 7.6 8.3 7.3  NEUTROABS  --   --  6.4  --   HGB 8.8* 7.9* 8.4* 8.1*  HCT 26.0* 23.3* 25.5* 24.6*  MCV 76.0* 75.6* 76.8* 77.4*  PLT 289 187 173 0000000   Basic Metabolic Panel: Recent Labs  Lab 09/03/19 0328 09/03/19 1510 09/04/19 0342 09/04/19 0904 09/05/19 0400  NA 136  --  137  --  136  K 2.4* 4.6 5.2* 4.3 4.0  CL 107  --  109  --  108  CO2 22  --  22  --  23  GLUCOSE 129*  --  113*  --  148*  BUN 13  --  14  --  16  CREATININE 0.88  --  0.83  --  0.85  CALCIUM 7.5*  --  7.6*  --  7.7*  MG 1.5*  --  2.5*  --  1.9   GFR: Estimated Creatinine Clearance: 52.2 mL/min (by C-G formula based on SCr of 0.85 mg/dL). Liver Function Tests: No results for input(s): AST, ALT, ALKPHOS, BILITOT, PROT, ALBUMIN in the last 168 hours. No results for  input(s): LIPASE, AMYLASE in the last 168 hours. No results for input(s): AMMONIA in the last 168 hours. Coagulation Profile: Recent Labs  Lab 09/01/19 0528  INR 1.4*   Cardiac Enzymes: No results for input(s): CKTOTAL, CKMB, CKMBINDEX, TROPONINI in the last 168 hours. BNP (last 3 results) No results for input(s): PROBNP in the last 8760 hours. HbA1C: No results for input(s): HGBA1C in the last 72 hours. CBG: Recent Labs  Lab 09/05/19 2055 09/06/19 0743 09/06/19 1204 09/06/19 1711 09/06/19 2055  GLUCAP 224* 216* 246* 231* 208*   Lipid Profile: No results for input(s): CHOL, HDL, LDLCALC, TRIG, CHOLHDL, LDLDIRECT in the last 72 hours. Thyroid Function Tests: No results for input(s): TSH, T4TOTAL, FREET4, T3FREE, THYROIDAB in the last 72 hours. Anemia Panel: No results for input(s): VITAMINB12, FOLATE, FERRITIN, TIBC, IRON, RETICCTPCT in the last 72 hours. Sepsis Labs: No results for input(s): PROCALCITON, LATICACIDVEN in the last 168 hours.  Recent Results (from the past 240 hour(s))  Acid Fast Smear (AFB)     Status: None   Collection Time: 09/01/19 10:00 AM   Specimen: Tissue  Result Value Ref Range Status   AFB Specimen Processing Comment  Final    Comment: Tissue Grinding and Digestion/Decontamination   Acid Fast Smear Negative  Final    Comment: (NOTE) Performed At: [facility] [place], [place] [id number] Rush Farmer MD [id number]    Source (AFB) LUNG  Final    Comment: Performed at [facility], [address] [place], [place] [address]  Culture, fungus without smear     Status: None (Preliminary result)   Collection Time: 09/01/19 10:00 AM   Specimen: Abscess; Other  Result Value Ref Range Status   Specimen Description   Final    ABSCESS Performed at [facility], [address] [place], [place] [address]    Special Requests   Final    LUNG Performed at [facility], [address],  [place], [place] [address]    Culture   Final    NO FUNGUS ISOLATED AFTER 5 DAYS Performed at [facility], [place] [address], [place], [place] [address]    Report Status PENDING  Incomplete  Aerobic/Anaerobic Culture (surgical/deep wound)     Status: None   Collection Time: 09/01/19 10:00 AM   Specimen: Abscess; Tissue  Result Value Ref Range Status   Specimen Description   Final    ABSCESS Performed at [facility], [address] [place], [place] [address]    Special Requests   Final    LUNG Performed at [facility], [place], [place] [address]    Gram Stain NO WBC SEEN NO ORGANISMS SEEN   Final   Culture   Final    No growth aerobically or anaerobically. Performed at [facility], [place] [address], [place], [place] [address]    Report Status 09/06/2019 FINAL  Final         Radiology Studies: No results found.      Scheduled Meds:  acidophilus  1 capsule Oral Daily   ferrous sulfate  325 mg Oral Q breakfast   folic acid  1 mg Oral Daily   insulin aspart  0-9 Units Subcutaneous TID WC   insulin glargine  6 Units Subcutaneous QHS   liver oil-zinc oxide   Topical TID   midodrine  5 mg Oral TID WC   nystatin  5 mL Oral QID   pantoprazole  40 mg Oral Daily   Ensure Max Protein  11 oz Oral BID BM   Continuous Infusions:  sodium chloride Stopped (09/06/19 1757)   cefTRIAXone (ROCEPHIN)  IV Stopped (09/06/19 1712)   metronidazole 500 mg (09/07/19 0600)     LOS: 17 days    Time spent: 15-20 miinutes    Ezekiel Slocumb, DO [facility] Pager: [phone]  If 7PM-7AM, please contact night-coverage [email or web address] Password [facility] 09/07/2019, 7:44 AM

## 2019-09-07 NOTE — TOC Progression Note (Signed)
Transition of Care [facility]) - Progression Note    Patient Details  Name: Julia Shea MRN: [id number] Date of Birth: [birth year]  Transition [facility] CM/SW Contact  Shelbie Hutching, RN Phone Number: 09/07/2019, 3:01 PM  Clinical Narrative:    Plan for discharge to [facility] tomorrow once medically cleared from TEE performed today.  Patient's sister in law Julia Shea updated on plan of care.     Expected Discharge Plan: [place] Barriers to Discharge: Continued Medical Work up  Expected Discharge Plan and Services Expected Discharge Plan: [place] In-house Referral: Clinical Social Work Discharge Planning Services: CM Consult Post Acute Care Choice: [place] Living arrangements for the past 2 months: Apartment                                       Social Determinants of Health (SDOH) Interventions    Readmission Risk Interventions No flowsheet data found.

## 2019-09-08 ENCOUNTER — Inpatient Hospital Stay: Payer: Medicare Other

## 2019-09-08 ENCOUNTER — Encounter: Payer: Self-pay | Admitting: Cardiology

## 2019-09-08 LAB — BASIC METABOLIC PANEL
Anion gap: 6 (ref 5–15)
BUN: 19 mg/dL (ref 8–23)
CO2: 24 mmol/L (ref 22–32)
Calcium: 7.9 mg/dL — ABNORMAL LOW (ref 8.9–10.3)
Chloride: 105 mmol/L (ref 98–111)
Creatinine, Ser: 0.86 mg/dL (ref 0.44–1.00)
GFR calc Af Amer: >60 mL/min (ref >60)
GFR calc non Af Amer: >60 mL/min (ref >60)
Glucose, Bld: 305 mg/dL — ABNORMAL HIGH (ref 70–99)
Potassium: 3.3 mmol/L — ABNORMAL LOW (ref 3.5–5.1)
Sodium: 135 mmol/L (ref 135–145)

## 2019-09-08 LAB — CBC WITH DIFFERENTIAL/PLATELET
Abs Immature Granulocytes: 0.08 10*3/uL — ABNORMAL HIGH (ref 0.00–0.07)
Basophils Absolute: 0 10*3/uL (ref 0.0–0.1)
Basophils Relative: 1 %
Eosinophils Absolute: 0.1 10*3/uL (ref 0.0–0.5)
Eosinophils Relative: 2 %
HCT: 22.9 % — ABNORMAL LOW (ref 36.0–46.0)
Hemoglobin: 7.3 g/dL — ABNORMAL LOW (ref 12.0–15.0)
Immature Granulocytes: 1 %
Lymphocytes Relative: 20 %
Lymphs Abs: 1.3 10*3/uL (ref 0.7–4.0)
MCH: 25.3 pg — ABNORMAL LOW (ref 26.0–34.0)
MCHC: 31.9 g/dL (ref 30.0–36.0)
MCV: 79.5 fL — ABNORMAL LOW (ref 80.0–100.0)
Monocytes Absolute: 0.8 10*3/uL (ref 0.1–1.0)
Monocytes Relative: 12 %
Neutro Abs: 4 10*3/uL (ref 1.7–7.7)
Neutrophils Relative %: 64 %
Platelets: 273 10*3/uL (ref 150–400)
RBC: 2.88 MIL/uL — ABNORMAL LOW (ref 3.87–5.11)
RDW: 14.6 % (ref 11.5–15.5)
WBC: 6.2 10*3/uL (ref 4.0–10.5)
nRBC: 0 % (ref 0.0–0.2)

## 2019-09-08 LAB — MAGNESIUM: Magnesium: 1.5 mg/dL — ABNORMAL LOW (ref 1.7–2.4)

## 2019-09-08 LAB — SARS CORONAVIRUS 2 (TAT 6-24 HRS): SARS Coronavirus 2: NEGATIVE

## 2019-09-08 LAB — GLUCOSE, CAPILLARY
Glucose-Capillary: 237 mg/dL — ABNORMAL HIGH (ref 70–99)
Glucose-Capillary: 286 mg/dL — ABNORMAL HIGH (ref 70–99)
Glucose-Capillary: 191 mg/dL — ABNORMAL HIGH (ref 70–99)
Glucose-Capillary: 125 mg/dL — ABNORMAL HIGH (ref 70–99)

## 2019-09-08 MED ORDER — MAGNESIUM SULFATE 4 GM/100ML IV SOLN: 4.0000 g | Freq: Once | INTRAVENOUS | Status: DC

## 2019-09-08 MED ORDER — MIDODRINE HCL 5 MG PO TABS: 5.0000 mg | ORAL_TABLET | Freq: Three times a day (TID) | ORAL | 0 refills | Status: DC

## 2019-09-08 MED ORDER — RISAQUAD PO CAPS: 1.0000 | ORAL_CAPSULE | Freq: Every day | ORAL | 0 refills | Status: DC

## 2019-09-08 MED ORDER — FOLIC ACID 1 MG PO TABS: 1.0000 mg | ORAL_TABLET | Freq: Every day | ORAL | 0 refills | Status: DC

## 2019-09-08 MED ORDER — MAGNESIUM SULFATE 2 GM/50ML IV SOLN
2.0000 g | Freq: Once | INTRAVENOUS | Status: AC
  Administered 2019-09-08: 2 g via INTRAVENOUS
  Filled 2019-09-08: qty 50

## 2019-09-08 MED ORDER — TRAZODONE HCL 100 MG PO TABS: 100.0000 mg | ORAL_TABLET | Freq: Every day | ORAL | 2 refills | Status: DC

## 2019-09-08 MED ORDER — FERROUS SULFATE 325 (65 FE) MG PO TABS: 325.0000 mg | ORAL_TABLET | Freq: Every day | ORAL | 0 refills | Status: DC

## 2019-09-08 MED ORDER — POTASSIUM CHLORIDE ER 10 MEQ PO TBCR: 10.0000 meq | EXTENDED_RELEASE_TABLET | Freq: Every day | ORAL | 3 refills | Status: DC

## 2019-09-08 MED ORDER — ZINC OXIDE 40 % EX OINT: TOPICAL_OINTMENT | Freq: Three times a day (TID) | CUTANEOUS | 0 refills | Status: DC

## 2019-09-08 MED ORDER — MAGNESIUM OXIDE 400 (241.3 MG) MG PO TABS: 400.0000 mg | ORAL_TABLET | Freq: Every day | ORAL | 0 refills | Status: DC

## 2019-09-08 MED ORDER — MAGNESIUM OXIDE 400 (241.3 MG) MG PO TABS
400.0000 mg | ORAL_TABLET | Freq: Every day | ORAL | Status: DC
  Administered 2019-09-08 – 2019-09-09 (×2): 400 mg via ORAL
  Filled 2019-09-08 (×2): qty 1

## 2019-09-08 MED ORDER — POTASSIUM CHLORIDE 20 MEQ PO PACK
40.0000 meq | PACK | ORAL | Status: AC
  Administered 2019-09-08 (×2): 40 meq via ORAL
  Filled 2019-09-08 (×2): qty 2

## 2019-09-08 MED ORDER — METRONIDAZOLE 500 MG PO TABS: 500.0000 mg | ORAL_TABLET | Freq: Three times a day (TID) | ORAL | 0 refills | Status: AC

## 2019-09-08 MED ORDER — LOPERAMIDE HCL 2 MG PO CAPS: 2.0000 mg | ORAL_CAPSULE | Freq: Three times a day (TID) | ORAL | 0 refills | Status: DC | PRN

## 2019-09-08 MED ORDER — CEFPODOXIME PROXETIL 200 MG PO TABS: 200.0000 mg | ORAL_TABLET | Freq: Two times a day (BID) | ORAL | 0 refills | Status: DC

## 2019-09-08 MED ORDER — ENSURE MAX PROTEIN PO LIQD: 11.0000 [oz_av] | Freq: Two times a day (BID) | ORAL | Status: DC

## 2019-09-08 MED ORDER — PANTOPRAZOLE SODIUM 40 MG PO TBEC: 40.0000 mg | DELAYED_RELEASE_TABLET | Freq: Every day | ORAL | 3 refills | Status: DC

## 2019-09-08 MED ORDER — INSULIN NPH ISOPHANE & REGULAR (70-30) 100 UNIT/ML ~~LOC~~ SUSP: 10.0000 [IU] | Freq: Two times a day (BID) | SUBCUTANEOUS | 6 refills | Status: DC

## 2019-09-08 MED ORDER — GERHARDT'S BUTT CREAM: 1.0000 "application " | TOPICAL_CREAM | Freq: Three times a day (TID) | CUTANEOUS | Status: DC

## 2019-09-08 MED ORDER — METRONIDAZOLE 500 MG PO TABS: 500.0000 mg | ORAL_TABLET | Freq: Three times a day (TID) | ORAL | 0 refills | Status: DC

## 2019-09-08 MED ORDER — CEFPODOXIME PROXETIL 200 MG PO TABS: 200.0000 mg | ORAL_TABLET | Freq: Two times a day (BID) | ORAL | 0 refills | Status: AC

## 2019-09-08 NOTE — Progress Notes (Signed)
PROGRESS NOTE    Julia Shea  [id number] DOB: 1955/12/12 DOA: 08/21/2019  PCP: Patient, No Pcp Per    LOS - 18   Brief Narrative:  63 y.o. female withhistory of asthma, bilateral hip pain, depression, hypertension, history of MRSA infection, insulin-dependent diabetes mellitus, CKD stage III. Presented with N/V, generalized weakness and left hip pain x 1 week. EMS were called for a welfare check by neighbors, reported finding her lying in feces, urine and possible bedbugs.Evaluation in ED revealed hyponatremia, AKI, hyperglycemia, ?UTI and chest imaging with multiple cavitary lesions. Underwent CT-guided biopsy on 12/1. 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  Blood cultures negative.  No leukocytosis of fevers.  AFB smear negative, AFB culture still pending but negative so far.  CXR on day of discharge improved from 11/27 after 12 days of Rocephin and Flagyl.  Suspect etiology of cavitary lung lesions is due to aspiration and very poor dentition as patient has tooth decay and few broken teeth.  Recommend dental care in follow up. She will have 2 more weeks of oral antibiotics on discharge (Flagyl and Vantin), and is to follow up with Dr. Delaine Lame, infectious disease, in two weeks.  Subjective 12/8: Patient sleeping comfortable, awoke to voice. No events reported overnight.  Anxious to get out of the hospital.  No acute complaints.  Assessment & Plan:   Principal Problem:   Hyponatremia Active Problems:   Diabetes mellitus type 2, uncontrolled (HCC)   Severe malnutrition (HCC)   Acute lower UTI   AKI (acute kidney injury) ([place])   Cavitary lung disease   Hypotension   Left groin pain   Anemia of chronic disease   Hip pain   Diarrhea   Cavitary Lesions in Lung Seen on non-contrast CT chest. Differential  includesculture-negative infective endocarditis given very poor dentition,TB vs atypical mycobacteriumbut AFB smear negative, fungal(so far negative studies), malignancy, vasculitis, Negative blood cultures and normalTTE. PPD negative. Quantiferon Gold indeterminate. Colleague discussedcasewith rheumatologistbyphone, thoughtthe ANA and other positive titers only slightly elevated and are nonspecific and recommended the biopsy for diagnosis.  - ID following - cont Rocephin & Flagyl IV for now - PO antibiotics on d/c - 2 weeks - Flagyl and Vantin - f/u with Dr. Delaine Lame in 2 weeks  Generalized Weakness - per family report, patient has essentially been unable to ambulate for about 2 years, unable to care for herself but moved out from living with family, has frequent falls. Now further deconditioned due to current illness. --PT/OT- recommend SNF --fall precautions  Anemia of chronic disease- stable s/p RBC transfusion, no evidence of bleeding.  - monitor CBC - transfuse if Hbg< 7.0 - continue iron supplement, folic acid  Hypotension- stable on midodrine 5 mg TID, continue for now.  Left groin/hip pain- resolved per patient. Unclear if any hx of trauma or injury. Xray negative for fracture. - PT/OT  Hyponatremia- resolved  Acute kidney injury- improved  Type 2 diabetes, uncontrolled- Hbg A1C >14%.  - Lantus 6 units qHS - sliding scale coverage, sensitive - added Novolog 4 units TID WC scheduled in addition to SSI - monitor CBG's  Severe protein calorie malnutrition-  - dietician consulted - continue Ensure Max supplements  Stage II decubitus ulcer, right buttock- present on admission - Wound care - monitor closely - change position q2h  Chronic diarrhea - continue home loperamide as needed  GERD - cont PPI   DVT prophylaxis:SCD's  Code Status: Full Code Family Communication:brother,updatedby phone  Disposition Plan:To  [facility] tomorrow 12/9 - bed ready  Consultants:  Infectious Disease  Pulmonology  Procedures:  CT-guided biopsy 09/01/2019  TEE 09/07/2019  Antimicrobials:  Flagyl, Rocephin    Objective: Vitals:   09/07/19 2314 09/08/19 0425 09/08/19 1618 09/08/19 1931  BP: (!) 106/58 (!) 114/59 115/65 (!) 115/96  Pulse: 86 83 86 80  Resp:  16 16 17   Temp:  99.1 F (37.3 C) 98.6 F (37 C) 98.4 F (36.9 C)  TempSrc:  Oral Oral Oral  SpO2: 96% 96% 99% 99%  Weight:      Height:        Intake/Output Summary (Last 24 hours) at 09/08/2019 1941 Last data filed at 09/08/2019 1700 Gross per 24 hour  Intake 853.82 ml  Output 1900 ml  Net -1046.18 ml   Filed Weights   08/21/19 1317 08/22/19 0300 09/07/19 1219  Weight: 41.7 kg 52.7 kg 52.7 kg    Examination:  General exam: sleeping comfortably, no acute distress Respiratory system: clear to auscultation bilaterally, normal respiratory effort. Cardiovascular system: normal S1/S2, RRRno pedal edema.   Central nervous system: oriented x4. no gross focal neurologic deficits, normal speech   Data Reviewed: I have personally reviewed following labs and imaging studies  CBC: Recent Labs  Lab 09/03/19 0328 09/04/19 0342 09/05/19 0400 09/08/19 0324  WBC 7.6 8.3 7.3 6.2  NEUTROABS  --  6.4  --  4.0  HGB 7.9* 8.4* 8.1* 7.3*  HCT 23.3* 25.5* 24.6* 22.9*  MCV 75.6* 76.8* 77.4* 79.5*  PLT 187 173 162 [id number]   Basic Metabolic Panel: Recent Labs  Lab 09/03/19 0328 09/03/19 1510 09/04/19 0342 09/04/19 0904 09/05/19 0400 09/08/19 0324  NA 136  --  137  --  136 135  K 2.4* 4.6 5.2* 4.3 4.0 3.3*  CL 107  --  109  --  108 105  CO2 22  --  22  --  23 24  GLUCOSE 129*  --  113*  --  148* 305*  BUN 13  --  14  --  16 19  CREATININE 0.88  --  0.83  --  0.85 0.86  CALCIUM 7.5*  --  7.6*  --  7.7* 7.9*  MG 1.5*  --  2.5*  --  1.9 1.5*   GFR: Estimated Creatinine Clearance: 51.6 mL/min (by C-G formula based on SCr of 0.86  mg/dL). Liver Function Tests: No results for input(s): AST, ALT, ALKPHOS, BILITOT, PROT, ALBUMIN in the last 168 hours. No results for input(s): LIPASE, AMYLASE in the last 168 hours. No results for input(s): AMMONIA in the last 168 hours. Coagulation Profile: No results for input(s): INR, PROTIME in the last 168 hours. Cardiac Enzymes: No results for input(s): CKTOTAL, CKMB, CKMBINDEX, TROPONINI in the last 168 hours. BNP (last 3 results) No results for input(s): PROBNP in the last 8760 hours. HbA1C: No results for input(s): HGBA1C in the last 72 hours. CBG: Recent Labs  Lab 09/07/19 1729 09/07/19 2136 09/08/19 0755 09/08/19 1152 09/08/19 1653  GLUCAP 129* 201* 237* 286* 191*   Lipid Profile: No results for input(s): CHOL, HDL, LDLCALC, TRIG, CHOLHDL, LDLDIRECT in the last 72 hours. Thyroid Function Tests: No results for input(s): TSH, T4TOTAL, FREET4, T3FREE, THYROIDAB in the last 72 hours. Anemia Panel: No results for input(s): VITAMINB12, FOLATE, FERRITIN, TIBC, IRON, RETICCTPCT in the last 72 hours. Sepsis Labs: No results for input(s): PROCALCITON, LATICACIDVEN in the last 168 hours.  Recent Results (from the past 240 hour(s))  Acid Fast Smear (AFB)     Status: None   Collection Time: 09/01/19 10:00 AM   Specimen: Tissue  Result Value Ref Range Status   AFB Specimen Processing Comment  Final    Comment: Tissue Grinding and Digestion/Decontamination   Acid Fast Smear Negative  Final    Comment: (NOTE) Performed At: [facility] [address] [place], [place] [id number] Rush Farmer MD [id number]    Source (AFB) LUNG  Final    Comment: Performed at [facility], [address] [place], [place] [address]  Culture, fungus without smear     Status: None (Preliminary result)   Collection Time: 09/01/19 10:00 AM   Specimen: Abscess; Other  Result Value Ref Range Status   Specimen Description   Final    ABSCESS Performed at [facility], [address], [place], [place] [address]    Special Requests   Final    LUNG Performed at [facility], [address] [place], [place] [address]    Culture   Final    NO FUNGUS ISOLATED AFTER 7 DAYS Performed at [facility], [place] [address], [place], [place] [address]    Report Status PENDING  Incomplete  Aerobic/Anaerobic Culture (surgical/deep wound)     Status: None   Collection Time: 09/01/19 10:00 AM   Specimen: Abscess; Tissue  Result Value Ref Range Status   Specimen Description   Final    ABSCESS Performed at [facility], [address], [place], [place] [address]    Special Requests   Final    LUNG Performed at [facility], [place], [place] [address]    Gram Stain NO WBC SEEN NO ORGANISMS SEEN   Final   Culture   Final    No growth aerobically or anaerobically. Performed at [facility], [place] [address] [place], [place] [address]    Report Status 09/06/2019 FINAL  Final  SARS CORONAVIRUS 2 (TAT 6-24 HRS) Nasopharyngeal Nasopharyngeal Swab     Status: None   Collection Time: 09/07/19  4:00 PM   Specimen: Nasopharyngeal Swab  Result Value Ref Range Status   SARS Coronavirus 2 NEGATIVE NEGATIVE Final    Comment: (NOTE) SARS-CoV-2 target nucleic acids are NOT DETECTED. The SARS-CoV-2 RNA is generally detectable in upper and lower respiratory specimens during the acute phase of infection. Negative results do not preclude SARS-CoV-2 infection, do not rule out co-infections with other pathogens, and should not be used as the sole basis for treatment or other patient management decisions. Negative results must be combined with clinical observations, patient history, and epidemiological information. The expected result is Negative. Fact Sheet for Patients: SugarRoll.be Fact Sheet for Healthcare Providers: https://www.woods-mathews.com/ This test is not yet  approved or cleared by the [place] FDA and  has been authorized for detection and/or diagnosis of SARS-CoV-2 by FDA under an Emergency Use Authorization (EUA). This EUA will remain  in effect (meaning this test can be used) for the duration of the COVID-19 declaration under Section 56 4(b)(1) of the Act, 21 U.S.C. section 360bbb-3(b)(1), unless the authorization is terminated or revoked sooner. Performed at [facility], [place] [address] [place], [place] [address]          Radiology Studies: Dg Chest 2 View  Result Date: 09/08/2019 CLINICAL DATA:  Pulmonary infiltrates. EXAM: CHEST - 2 VIEW COMPARISON:  Chest x-ray dated 09/01/2019 and 08/28/2019  and CT scan dated 09/01/2019 FINDINGS: There has been marked improvement in the multiple bilateral pulmonary nodular densities with significant decrease in the size of the cavitary lesion in the right upper lobe. Stable lesion in the left upper lobe. Heart size and vascularity are normal. Small bilateral pleural effusions persist. No bone abnormality. Aortic atherosclerosis. IMPRESSION: 1. Significant partial clearing of the multiple bilateral densities including a decreased size of the cavitary lesion in the right upper lobe. 2. No change in the cavitary lesion in the left upper lobe. 3. Persistent small bilateral effusions 4. aortic Atherosclerosis (ICD10-I70.0). Electronically Signed   By: Lorriane Shire M.D.   On: 09/08/2019 09:12        Scheduled Meds: . acidophilus  1 capsule Oral Daily  . ferrous sulfate  325 mg Oral Q breakfast  . folic acid  1 mg Oral Daily  . Gerhardt's butt cream   Topical TID  . insulin aspart  0-9 Units Subcutaneous TID WC  . insulin aspart  4 Units Subcutaneous TID WC  . insulin glargine  6 Units Subcutaneous QHS  . liver oil-zinc oxide   Topical TID  . magnesium oxide  400 mg Oral Daily  . midodrine  5 mg Oral TID WC  . nystatin  5 mL Oral QID  . pantoprazole  40 mg Oral Daily  . Ensure Max  Protein  11 oz Oral BID BM   Continuous Infusions: . sodium chloride Stopped (09/08/19 0549)  . cefTRIAXone (ROCEPHIN)  IV 2 g (09/08/19 1435)  . metronidazole 500 mg (09/08/19 1500)     LOS: 18 days    Time spent: 30 minutes    Ezekiel Slocumb, DO [facility] Pager: [phone]  If 7PM-7AM, please contact night-coverage [email or web address] Password [facility] 09/08/2019, 7:41 PM

## 2019-09-08 NOTE — TOC Progression Note (Signed)
Transition of Care [facility]) - Progression Note    Patient Details  Name: Julia Shea MRN: [id number] Date of Birth: 1956/04/18  Transition of Care [facility]) CM/SW Contact  Shelbie Hutching, RN Phone Number: 09/08/2019, 4:27 PM  Clinical Narrative:    Discharge delayed due to [facility].  [facility] reports that they need to verify insurance information, apparently a workers comp claim is showing up when they run the patient's information.  Patient notified and patient's sister in law Julia Shea updated. MD is also aware.     Expected Discharge Plan: [place] Barriers to Discharge: Barriers Resolved  Expected Discharge Plan and Services Expected Discharge Plan: [place] In-house Referral: Clinical Social Work Discharge Planning Services: CM Consult Post Acute Care Choice: [place] Living arrangements for the past 2 months: Apartment Expected Discharge Date: 09/08/19                                     Social Determinants of Health (SDOH) Interventions    Readmission Risk Interventions No flowsheet data found.

## 2019-09-08 NOTE — Discharge Instructions (Signed)
Antibiotic Medicine, Adult  Antibiotic medicines treat infections caused by a type of germ called bacteria. They work by killing the bacteria that make you sick. When do I need to take antibiotics? You often need these medicines to treat bacterial infections, such as:  A urinary tract infection (UTI).  Strep throat.  Meningitis. This affects the spinal cord and brain.  A bad lung infection. You may start the medicines while your doctor waits for tests to come back. When the tests come back, your doctor may change or stop your medicine. When are antibiotics not needed? You do not need these medicines for most common illnesses, such as:  A cold.  The flu.  A sore throat. Antibiotics are not always needed for all infections caused by bacteria. Do not ask for these medicines, or take them, when they are not needed. What are the risks of taking antibiotics? Most antibiotics can cause an infection called Clostridioides difficile (C. diff). This causes watery poop (diarrhea). Let your doctor know right away if:  You have watery poop while taking an antibiotic.  You have watery poop after you stop taking an antibiotic. The illness can happen weeks after you stop the medicine. You also have a risk of getting an infection in the future that antibiotics cannot treat (antibiotic-resistant infection). This type of infection can be dangerous. What else should I know about taking antibiotics?   You need to take the entire prescription. ? Take the medicine for as long as told by your doctor. ? Do not stop taking it even if you start to feel better.  Try not to miss any doses. If you miss a dose, call your doctor.  Birth control pills may not work. If you take birth control pills: ? Keep on taking them. ? Use a second form of birth control, such as a condom. Do this for as long as told by your doctor.  Ask your doctor: ? How long to wait in between doses. ? If you should take the medicine  with food. ? If there is anything you should stay away from while taking the antibiotic, such as: ? Food. ? Drinks. ? Medicines. ? If there are any side effects you should watch for.  Only take the medicines that your doctor told you to take. Do not take medicines that were given to someone else.  Drink a large glass of water with the medicine.  Ask the pharmacist for a tool to measure the medicine, such as: ? A syringe. ? A cup. ? A spoon.  Throw away any extra medicine. Contact a doctor if:  You get worse.  You have new joint pain or muscle aches after starting the medicine.  You have side effects from the medicine, such as: ? Stomach pain. ? Watery poop. ? Feeling sick to your stomach (nausea). Get help right away if:  You have signs of a very bad allergic reaction. If this happens, stop taking the medicine right away. Signs may include: ? Hives. These are raised, itchy, red bumps on the skin. ? Skin rash. ? Trouble breathing. ? Wheezing. ? Swelling. ? Feeling dizzy. ? Throwing up (vomiting).  Your pee (urine) is dark, or is the color of blood.  Your skin turns yellow.  You bruise easily.  You bleed easily.  You have very bad watery poop and cramps in your belly.  You have a very bad headache. Summary  Antibiotics are often used to treat infections caused by bacteria.  Only  take these medicines when needed.  Let your doctor know if you have watery poop while taking an antibiotic.  You need to take the entire prescription. This information is not intended to replace advice given to you by your health care provider. Make sure you discuss any questions you have with your health care provider. Document Released: 06/26/2008 Document Revised: 10/24/2018 Document Reviewed: 09/19/2016 Elsevier Patient Education  [place].   Abdominal Pain, Adult  Many things can cause belly (abdominal) pain. Most times, belly pain is not dangerous. Many cases of  belly pain can be watched and treated at home. Sometimes belly pain is serious, though. Your doctor will try to find the cause of your belly pain. Follow these instructions at home:  Take over-the-counter and prescription medicines only as told by your doctor. Do not take medicines that help you poop (laxatives) unless told to by your doctor.  Drink enough fluid to keep your pee (urine) clear or pale yellow.  Watch your belly pain for any changes.  Keep all follow-up visits as told by your doctor. This is important. Contact a doctor if:  Your belly pain changes or gets worse.  You are not hungry, or you lose weight without trying.  You are having trouble pooping (constipated) or have watery poop (diarrhea) for more than 2-3 days.  You have pain when you pee or poop.  Your belly pain wakes you up at night.  Your pain gets worse with meals, after eating, or with certain foods.  You are throwing up and cannot keep anything down.  You have a fever. Get help right away if:  Your pain does not go away as soon as your doctor says it should.  You cannot stop throwing up.  Your pain is only in areas of your belly, such as the right side or the left lower part of the belly.  You have bloody or black poop, or poop that looks like tar.  You have very bad pain, cramping, or bloating in your belly.  You have signs of not having enough fluid or water in your body (dehydration), such as: ? Dark pee, very little pee, or no pee. ? Cracked lips. ? Dry mouth. ? Sunken eyes. ? Sleepiness. ? Weakness. This information is not intended to replace advice given to you by your health care provider. Make sure you discuss any questions you have with your health care provider. Document Released: 03/05/2008 Document Revised: 04/06/2016 Document Reviewed: 02/29/2016 Elsevier Interactive Patient Education  [employer].

## 2019-09-08 NOTE — Care Management Important Message (Signed)
Important Message  Patient Details  Name: Julia Shea MRN: [id number] Date of Birth: 1956-09-20   Medicare Important Message Given:  Yes     Juliann Pulse A Lacrisha Bielicki 09/08/2019, 11:41 AM

## 2019-09-08 NOTE — Progress Notes (Addendum)
ID Patient Vitals for the past 24 hrs:  BP Temp Temp src Pulse Resp SpO2  09/08/19 0425 (!) 114/59 99.1 F (37.3 C) Oral 83 16 96 %  09/07/19 2314 (!) 106/58 - - 86 - 96 %  09/07/19 1925 (!) 141/122 98.6 F (37 C) - 76 - (!) 87 %  09/07/19 1727 102/68 97.8 F (36.6 C) Oral (!) 58 - 94 %  09/07/19 1722 102/68 97.8 F (36.6 C) Oral 81 - 97 %  09/07/19 1330 96/61 - - 73 12 97 %  09/07/19 1315 (!) 89/59 - - 66 13 96 %  09/07/19 1300 (!) 103/54 - - 66 19 96 %  09/07/19 1256 (!) 111/58 - - 68 15 99 %  09/07/19 1250 112/65 - - 67 12 97 %  09/07/19 1244 - - - 81 16 100 %  09/07/19 1240 133/77 - - 82 (!) 22 100 %    CBC Latest Ref Rng & Units 09/08/2019 09/05/2019 09/04/2019  WBC 4.0 - 10.5 K/uL 6.2 7.3 8.3  Hemoglobin 12.0 - 15.0 g/dL 7.3(L) 8.1(L) 8.4(L)  Hematocrit 36.0 - 46.0 % 22.9(L) 24.6(L) 25.5(L)  Platelets 150 - 400 K/uL 273 162 173    CMP Latest Ref Rng & Units 09/08/2019 09/05/2019 09/04/2019  Glucose 70 - 99 mg/dL 305(H) 148(H) -  BUN 8 - 23 mg/dL 19 16 -  Creatinine 0.44 - 1.00 mg/dL 0.86 0.85 -  Sodium 135 - 145 mmol/L 135 136 -  Potassium 3.5 - 5.1 mmol/L 3.3(L) 4.0 4.3  Chloride 98 - 111 mmol/L 105 108 -  CO2 22 - 32 mmol/L 24 23 -  Calcium 8.9 - 10.3 mg/dL 7.9(L) 7.7(L) -  Total Protein 6.5 - 8.1 g/dL - - -  Total Bilirubin 0.3 - 1.2 mg/dL - - -  Alkaline Phos 38 - 126 U/L - - -  AST 15 - 41 U/L - - -  ALT 0 - 44 U/L - - -   pts cxr from today much better than before   09/08/19  08/28/19    Impression/Recommendation B/l cavitary and nodular lesions - very likely from aspiration as TEE does not show any vegetation. She has decaying, broken, few teeth left behind and that is the source for this infection Blood cultures negative, TEE neg, Leucocytosis N, no fever ( lung biopsy showed necrosis and inflammation- special stains neg, cultures neg so far- AFB neg)  pts cxr from today much better than from 08/28/19  She has responded to ceftriaxone + flagyl ( day 12)   As she has PCN allergy will send her on cefpodoxime ( Vantin) 200mg  PO  BID and Flagyl 500mg  Po TID for 2 weeks Discussed with Hospitalist

## 2019-09-08 NOTE — TOC Transition Note (Signed)
Transition of Care [facility]) - CM/SW Discharge Note   Patient Details  Name: Vernee Blankenbaker MRN: [id number] Date of Birth: 1955-12-24  [facility]) CM/SW Contact:  Shelbie Hutching, RN Phone Number: 09/08/2019, 2:30 PM   Clinical Narrative:    Patient is medically stable to discharge to [facility] today.  Patient's sister in law [name] updated.  Patient will transport via [employer] cabin crew.  Bedside nurse will call report to [phone].   Final next level of care: Skilled Nursing Facility Barriers to Discharge: Barriers Resolved   Patient Goals and CMS Choice   CMS Medicare.gov Compare Post Acute Care list provided to:: Patient Represenative (must comment) Choice offered to / list presented to : Sibling  Discharge Placement              Patient chooses bed at: [facility] Patient to be transferred to facility by: [facility] Name of family member notified: [name] Coots Patient and family notified of of transfer: 09/08/19  Discharge Plan and Services In-house Referral: Clinical Social Work Discharge Planning Services: CM Consult Post Acute Care Choice: [place]                               Social Determinants of Health (SDOH) Interventions     Readmission Risk Interventions No flowsheet data found.

## 2019-09-08 NOTE — Discharge Summary (Addendum)
Physician Discharge Summary  Julia Shea [id number] DOB: 01-23-1956 DOA: 08/21/2019  PCP: Patient, No Pcp Per  Admit date: 08/21/2019 Discharge date: 09/09/2019  Admitted From: Home Disposition:  SNF  Recommendations for Outpatient Follow-up:  1. Follow up with PCP in 1-2 weeks 2. Please obtain BMP/CBC in one week 3. Please follow up with Dr. Delaine Lame, Infectious Disease in 2 weeks 4. Please establish with a dentist   Home Health: No  Equipment/Devices: None   Discharge Condition: Stable  CODE STATUS: Full  Diet recommendation: Dysphagia 3, thin liquids  Brief/Interim Summary:  63 y.o. female withhistory of asthma, bilateral hip pain, depression, hypertension, history of MRSA infection, insulin-dependent diabetes mellitus, CKD stage III. Presented with N/V, generalized weakness and left hip pain x 1 week. EMS were called for a welfare check by neighbors, reported finding her lying in feces, urine and possible bedbugs.Evaluation in ED revealed hyponatremia, AKI, hyperglycemia, ?UTI and chest imaging with multiple cavitary lesions. Underwent CT-guided biopsy on 12/1. 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  Blood cultures negative.  No leukocytosis of fevers.  AFB smear negative, AFB culture still pending but negative so far.  CXR on day of discharge improved from 11/27 after 12 days of Rocephin and Flagyl.  Suspect etiology of cavitary lung lesions is due to aspiration and very poor dentition as patient has tooth decay and few broken teeth.  Recommend dental care in follow up. She will have 2 more weeks of oral antibiotics on discharge (Flagyl and Vantin), and is to follow up with Dr. Delaine Lame, infectious disease, in two weeks.  Patient has chronic anemia which is likely multifactorial with chronic disease /  inflammation primary factor.  Did require transfusion of 1 unit RBC's for hemoglobin under 7, but has been relatively stable since and without any bleeding.  She has been started on oral iron supplement and folic acid.  For her severe malnutrition, is on Ensure Max supplemental drinks.  PT evaluated patient and recommend SNF for rehab on discharge.  Patient is    Discharge Diagnoses: Principal Problem:   Hyponatremia Active Problems:   Diabetes mellitus type 2, uncontrolled (HCC)   Severe malnutrition (HCC)   Acute lower UTI   AKI (acute kidney injury) ([place])   Cavitary lung disease   Hypotension   Left groin pain   Anemia of chronic disease   Hip pain   Diarrhea    Discharge Instructions   Discharge Instructions    Call MD for:  temperature >100.4   Complete by: As directed    Diet - low sodium heart healthy   Complete by: As directed    Discharge instructions   Complete by: As directed    Take antibiotics as prescribed for two weeks.   Increase activity slowly   Complete by: As directed      Allergies as of 09/08/2019      Reactions   Penicillins    Has patient had a PCN reaction causing immediate rash, facial/tongue/throat swelling, SOB or lightheadedness with hypotension: Yes Has patient had a PCN reaction causing severe rash involving mucus membranes or skin necrosis: Yes Has patient had a PCN reaction that required hospitalization: Yes Has patient had a PCN reaction occurring within the last 10 years: Yes If all of the above answers are "NO", then may proceed with Cephalosporin use.   Sulfa Antibiotics       Medication List  TAKE these medications   acidophilus Caps capsule Take 1 capsule by mouth daily. Start taking on: September 09, 2019   cefpodoxime 200 MG tablet Commonly known as: VANTIN Take 1 tablet (200 mg total) by mouth 2 (two) times daily for 14 days.   Ensure Max Protein Liqd Take 330 mLs (11 oz total) by mouth 2 (two) times daily between  meals.   ferrous sulfate 325 (65 FE) MG tablet Take 1 tablet (325 mg total) by mouth daily with breakfast. Start taking on: December 9, [id number]   folic acid 1 MG tablet Commonly known as: FOLVITE Take 1 tablet (1 mg total) by mouth daily. Start taking on: September 09, 2019   Gerhardt's butt cream Crea Apply 1 application topically 3 (three) times daily.   insulin NPH-regular Human (70-30) 100 UNIT/ML injection Inject 10 Units into the skin 2 (two) times daily with a meal.   liver oil-zinc oxide 40 % ointment Commonly known as: DESITIN Apply topically 3 (three) times daily.   loperamide 2 MG capsule Commonly known as: IMODIUM Take 1 capsule (2 mg total) by mouth every 8 (eight) hours as needed for diarrhea or loose stools.   magnesium oxide 400 (241.3 Mg) MG tablet Commonly known as: MAG-OX Take 1 tablet (400 mg total) by mouth daily. Start taking on: September 09, 2019   metroNIDAZOLE 500 MG tablet Commonly known as: Flagyl Take 1 tablet (500 mg total) by mouth 3 (three) times daily for 14 days.   midodrine 5 MG tablet Commonly known as: PROAMATINE Take 1 tablet (5 mg total) by mouth 3 (three) times daily with meals.   multivitamin with minerals tablet Take 1 tablet by mouth daily.   pantoprazole 40 MG tablet Commonly known as: PROTONIX Take 1 tablet (40 mg total) by mouth daily.   potassium chloride 10 MEQ tablet Commonly known as: KLOR-CON Take 1 tablet (10 mEq total) by mouth daily.   traZODone 100 MG tablet Commonly known as: DESYREL Take 1 tablet (100 mg total) by mouth at bedtime.      Contact information for after-discharge care    Destination    [facility] .   Service: Skilled Nursing Contact information: [address] [place] [phone]             Allergies  Allergen Reactions  . Penicillins     Has patient had a PCN reaction causing immediate rash, facial/tongue/throat swelling, SOB  or lightheadedness with hypotension: Yes Has patient had a PCN reaction causing severe rash involving mucus membranes or skin necrosis: Yes Has patient had a PCN reaction that required hospitalization: Yes Has patient had a PCN reaction occurring within the last 10 years: Yes If all of the above answers are "NO", then may proceed with Cephalosporin use.  Ignacia Bayley Antibiotics     Consultations:  Infectious Disease  Pulmonology  Cardiology (for TEE)    Procedures/Studies: Dg Chest 2 View  Result Date: 09/08/2019 CLINICAL DATA:  Pulmonary infiltrates. EXAM: CHEST - 2 VIEW COMPARISON:  Chest x-ray dated 09/01/2019 and 08/28/2019 and CT scan dated 09/01/2019 FINDINGS: There has been marked improvement in the multiple bilateral pulmonary nodular densities with significant decrease in the size of the cavitary lesion in the right upper lobe. Stable lesion in the left upper lobe. Heart size and vascularity are normal. Small bilateral pleural effusions persist. No bone abnormality. Aortic atherosclerosis. IMPRESSION: 1. Significant partial clearing of the multiple bilateral densities including a decreased size of  the cavitary lesion in the right upper lobe. 2. No change in the cavitary lesion in the left upper lobe. 3. Persistent small bilateral effusions 4. aortic Atherosclerosis (ICD10-I70.0). Electronically Signed   By: Lorriane Shire M.D.   On: 09/08/2019 09:12   Dg Pelvis 1-2 Views  Result Date: 08/27/2019 CLINICAL DATA:  Left groin pain and bilateral hip pain. EXAM: PELVIS - 1-2 VIEW COMPARISON:  08/21/2019 FINDINGS: Mild symmetric degenerative change of the hips. No acute fracture or dislocation. Degenerative change of the spine. Remainder the exam is unchanged. IMPRESSION: No acute findings. Electronically Signed   By: Marin Olp M.D.   On: 08/27/2019 12:36   Ct Chest Wo Contrast  Result Date: 08/30/2019 CLINICAL DATA:  Cavitary masses seen in the lungs on August 21, 2019, favored to  be infectious. Follow-up. EXAM: CT CHEST WITHOUT CONTRAST TECHNIQUE: Multidetector CT imaging of the chest was performed following the standard protocol without IV contrast. COMPARISON:  CT of the chest August 21, 2019. Chest x-ray August 28, 2019. FINDINGS: Cardiovascular: Atherosclerotic changes are seen in the nonaneurysmal aorta. Central pulmonary arteries are normal in caliber. Coronary artery calcifications involve the LAD and circumflex branches of the left coronary artery. The heart is unchanged. Mediastinum/Nodes: There are new small bilateral pleural effusions with associated atelectasis. The effusions partially obscure some of the cavitary nodules in the bases. No pericardial effusion is noted. The thyroid is unremarkable. No adenopathy. Lungs/Pleura: Bilateral cavitary nodules and masses are again identified. The cavitary mass seen on series 3, image 37 measures 6.3 by 4.0 cm today versus 5.9 x 3.5 cm previously. There is less air and more fluid/debris within this mass today. The cavitary mass in the periphery of the right upper lobe is seen on series 3, image 34 measures 5.6 x 3.3 cm today versus 3.6 x 3.5 cm previously. This cavitary mass is clearly larger in the interval. There is some surrounding pulmonary opacity which could represent additional infection. Specifically, there is opacity with air bronchograms just inferior medial to this cavitary mass is seen on series 3, image 44 which was not present previously. The cavitary mass seen on series 2, image 79 of the previous study is partially obscured by pleural fluid today making comparison difficult. However, this mass measures at least 4.7 x 2.8 cm today versus 5.2 x 2.4 cm previously. The adjacent cavitary mass located just superiorly is partially obscured by pleural fluid as well measuring at least 2.8 by 2.2 cm today versus 2.6 x 1.9 cm previously. There is a cluster of cavitary nodules in the left base adjacent to the diaphragm. Several of  these nodules appear to have merged measuring 3.8 x 2.8 cm today. The largest measured 2.9 x 2.1 cm previously. A new tiny nodule is seen in the left upper lobe on axial image 53. There is a new nodule seen in the left base on image 100. A new nodule is seen in the right base on image 90. A few other new nodules are noted. Several previously identified the nodules which were not measured on the previous study are larger today. Upper Abdomen: No acute abnormality. Musculoskeletal: No chest wall mass or suspicious bone lesions identified. IMPRESSION: 1. Numerous bilateral cavitary nodules and masses are identified. There are several new nodules. Most of the previously identified nodules and masses are larger. The findings could be due to atypical infection, cavitary malignancy, or septic emboli. The patient is scheduled for bronchoscopy. 2. Atherosclerotic change in the thoracic aorta. Coronary artery  calcifications. 3. Bilateral pleural effusions. 4. Aortic Atherosclerosis (ICD10-I70.0). Electronically Signed   By: Dorise Bullion III M.D   On: 08/30/2019 15:14   Ct Chest Wo Contrast  Result Date: 08/21/2019 CLINICAL DATA:  Cough.  Persistent left lung cavitary lesion. EXAM: CT CHEST WITHOUT CONTRAST TECHNIQUE: Multidetector CT imaging of the chest was performed following the standard protocol without IV contrast. COMPARISON:  Prior chest radiographs. FINDINGS: Cardiovascular: Heart is normal in size and configuration. No pericardial effusion. Two vessel coronary artery calcifications. Aortic atherosclerotic calcifications. Mediastinum/Nodes: No enlarged mediastinal or axillary lymph nodes. Thyroid gland, trachea, and esophagus demonstrate no significant findings. Lungs/Pleura: Multiple cavitary lesion/masses and non cavitary nodules. In the left upper lobe, there is a cavitary lesion that measures 5.9 x 3.5 cm transversely, centered on image 49, series 2. Cavities contain dependent fluid. There is hazy airspace  opacity along the periphery of the lesion mostly at superior aspect. In the right upper lobe there is a cavitary mass/lesion measuring 3.6 x 3.5 cm, centered on image 32, series 2. The central cavity contains dependent fluid. There is hazy airspace opacity surrounding the periphery of this lesion. In the posterior right lower lobe there is a cavitary mass/lesion measuring 5.2 x 2.4 cm containing bubbles of air, centered on image 79, series 2. A second cavitary lesion is seen superior to this measuring 2.6 x 1.9 cm. Hazy ground-glass opacity also is seen along the periphery of these lesions. In the left lower lobe abutting the inferior oblique fissure, image 105, series 2, there is a nodule measuring 2.9 x 2.1 cm containing a single small bubble of nondependent air. There are several other lung nodules mostly in the lower lobes. Remainder of the lungs is clear. No evidence of pulmonary edema. No pleural effusion or pneumothorax. Upper Abdomen: No acute abnormality. Musculoskeletal: No fracture or acute finding. No osteoblastic or osteolytic lesions. IMPRESSION: 1. Multiple cavitary masses/lesions and lung nodules as detailed above. Suspect the etiology is infectious/inflammatory. Larger cavitary lesions contain dependent fluid supporting infection. Consider atypical infections including fungal and mycobacterial. Recommend follow-up chest CT after treatment to assess response. 2. No other acute abnormality within the chest. 3. Coronary artery calcifications and aortic atherosclerosis. Aortic Atherosclerosis (ICD10-I70.0). Electronically Signed   By: Lajean Manes M.D.   On: 08/21/2019 18:14   Ct Guided Needle Placement  Result Date: 09/01/2019 INDICATION: Multiple cavitary lesions. EXAM: CT-DIRECTED BIOPSY. MEDICATIONS: None. ANESTHESIA/SEDATION: None. FLUOROSCOPY TIME:  Fluoroscopy Time: None. COMPLICATIONS: None immediate. PROCEDURE: After discussing the risks and benefits of this procedure with the patient  informed consent was obtained. Airborne disease precautions were taken. Right back was sterilely prepped and draped. Following local anesthesia with 1% lidocaine a 18 gauge core biopsy system was advanced into the right posterior pleural-based lung mass without complication. Three separate core biopsies obtained, two 23 mm and one 13 mm, and sent to pathology for histologic evaluation. Two separate 13 mm samples obtained and mixed with saline and sent to microbiology for aerobic and anaerobic cultures and sensitivities, TB and fungal studies. Post biopsy CT was unremarkable. IMPRESSION: Successful CT-directed biopsy of right posterior lung mass in this patient with known multiple cavitary lesions. Multiple 18 gauge core samples sent to pathology for histologic and microbiologic evaluations. Electronically Signed   By: Marcello Moores  Register   On: 09/01/2019 14:26   Dg Chest Port 1 View  Result Date: 09/01/2019 CLINICAL DATA:  Lung mass biopsy. EXAM: PORTABLE CHEST 1 VIEW COMPARISON:  CT 08/30/2019.  Chest x-ray 08/28/2019. FINDINGS:  Mediastinum and hilar structures are stable. Heart size stable. Multiple cavitary mass is again noted both lungs. No evidence of pneumothorax post biopsy. Small left pleural effusion. No acute bony abnormality. IMPRESSION: No evidence of pneumothorax post biopsy. Electronically Signed   By: Marcello Moores  Register   On: 09/01/2019 15:51   Dg Chest Port 1 View  Result Date: 08/28/2019 CLINICAL DATA:  Cavitary lung disease. EXAM: PORTABLE CHEST 1 VIEW COMPARISON:  CT chest and chest x-ray dated August 21, 2019. FINDINGS: The heart size and mediastinal contours are within normal limits. Atherosclerotic calcification of the aortic arch. Normal pulmonary vascularity. Large bilateral upper lobe cavitary lesions again noted. Patchy nodular densities at the lung bases appear worsened. No pleural effusion or pneumothorax. No acute osseous abnormality. IMPRESSION: 1. Slightly worsened nodular  densities at both lung bases with unchanged dominant bilateral upper lobe cavitary nodules. Electronically Signed   By: Titus Dubin M.D.   On: 08/28/2019 09:47   Dg Chest Portable 1 View  Result Date: 08/21/2019 CLINICAL DATA:  Cough. EXAM: PORTABLE CHEST 1 VIEW COMPARISON:  January 23, 2018 FINDINGS: The patient has multiple cavitary lesions in the left lung as well as multiple other poorly defined patchy lesions in the right lung and in the left lung base. The heart size and pulmonary vascularity are normal. No discrete effusions. Aortic atherosclerosis. No significant bone abnormality. There also is suggestion of a mass in the left breast. IMPRESSION: 1. Multiple cavitary lesions in the left lung and in the left lung base. The differential diagnosis includes cavitary malignancy, septic emboli, and atypical infection. 2. Possible mass in the left breast. 3. Aortic atherosclerosis. Electronically Signed   By: Lorriane Shire M.D.   On: 08/21/2019 14:20   Dg Hip Unilat With Pelvis 2-3 Views Left  Result Date: 08/21/2019 CLINICAL DATA:  Bilateral hip pain since a fall 1 week ago. Unable to bear weight. EXAM: DG HIP (WITH OR WITHOUT PELVIS) 2-3V LEFT COMPARISON:  Radiographs dated 11/13/2017 FINDINGS: There is no evidence of hip fracture or dislocation. There is no evidence of arthropathy or other focal bone abnormality. IMPRESSION: Negative. Electronically Signed   By: Lorriane Shire M.D.   On: 08/21/2019 14:17      CT guided lung biopsy on 09/01/2019   Subjective: Patient seen and examined this AM.  No acute events reported overnight.  She reports concern of being very weak.  No fever/chills or other acute complaints.   Discharge Exam: Vitals:   09/07/19 2314 09/08/19 0425  BP: (!) 106/58 (!) 114/59  Pulse: 86 83  Resp:  16  Temp:  99.1 F (37.3 C)  SpO2: 96% 96%   Vitals:   09/07/19 1727 09/07/19 1925 09/07/19 2314 09/08/19 0425  BP: 102/68 (!) 141/122 (!) 106/58 (!) 114/59  Pulse:  (!) 58 76 86 83  Resp:    16  Temp: 97.8 F (36.6 C) 98.6 F (37 C)  99.1 F (37.3 C)  TempSrc: Oral   Oral  SpO2: 94% (!) 87% 96% 96%  Weight:      Height:        General: Pt is alert, awake, not in acute distress, frail, underweight Cardiovascular: RRR, S1/S2 +, no rubs, no gallops Respiratory: CTA bilaterally, no wheezing, no rhonchi Abdominal: Soft, NT, ND, bowel sounds + Extremities: no edema, no cyanosis    The results of significant diagnostics from this hospitalization (including imaging, microbiology, ancillary and laboratory) are listed below for reference.     Microbiology: Recent Results (  from the past 240 hour(s))  Acid Fast Smear (AFB)     Status: None   Collection Time: 09/01/19 10:00 AM   Specimen: Tissue  Result Value Ref Range Status   AFB Specimen Processing Comment  Final    Comment: Tissue Grinding and Digestion/Decontamination   Acid Fast Smear Negative  Final    Comment: (NOTE) Performed At: [facility] [address] [place], [place] [id number] Rush Farmer MD [id number]    Source (AFB) LUNG  Final    Comment: Performed at [facility], [address] [place], [place] [address]  Culture, fungus without smear     Status: None (Preliminary result)   Collection Time: 09/01/19 10:00 AM   Specimen: Abscess; Other  Result Value Ref Range Status   Specimen Description   Final    ABSCESS Performed at [facility], [address] [place], [place] [address]    Special Requests   Final    LUNG Performed at [facility], [address], [place], [place] [address]    Culture   Final    NO FUNGUS ISOLATED AFTER 7 DAYS Performed at [facility], [place] [address], [place], [place] [address]    Report Status PENDING  Incomplete  Aerobic/Anaerobic Culture (surgical/deep wound)     Status: None   Collection Time: 09/01/19 10:00 AM   Specimen: Abscess; Tissue  Result Value Ref Range Status   Specimen  Description   Final    ABSCESS Performed at [facility], [address] [place], [place] [address]    Special Requests   Final    LUNG Performed at [facility], [place], [place] [address]    Gram Stain NO WBC SEEN NO ORGANISMS SEEN   Final   Culture   Final    No growth aerobically or anaerobically. Performed at [facility], [place] [address] [place],  [address]    Report Status 09/06/2019 FINAL  Final  SARS CORONAVIRUS 2 (TAT 6-24 HRS) Nasopharyngeal Nasopharyngeal Swab     Status: None   Collection Time: 09/07/19  4:00 PM   Specimen: Nasopharyngeal Swab  Result Value Ref Range Status   SARS Coronavirus 2 NEGATIVE NEGATIVE Final    Comment: (NOTE) SARS-CoV-2 target nucleic acids are NOT DETECTED. The SARS-CoV-2 RNA is generally detectable in upper and lower respiratory specimens during the acute phase of infection. Negative results do not preclude SARS-CoV-2 infection, do not rule out co-infections with other pathogens, and should not be used as the sole basis for treatment or other patient management decisions. Negative results must be combined with clinical observations, patient history, and epidemiological information. The expected result is Negative. Fact Sheet for Patients: SugarRoll.be Fact Sheet for Healthcare Providers: https://www.woods-mathews.com/ This test is not yet approved or cleared by the [place] FDA and  has been authorized for detection and/or diagnosis of SARS-CoV-2 by FDA under an Emergency Use Authorization (EUA). This EUA will remain  in effect (meaning this test can be used) for the duration of the COVID-19 declaration under Section 56 4(b)(1) of the Act, 21 U.S.C. section 360bbb-3(b)(1), unless the authorization is terminated or revoked sooner. Performed at [facility], [place] [address], [place],  [address]      Labs: BNP (last 3  results) No results for input(s): BNP in the last 8760 hours. Basic Metabolic Panel: Recent Labs  Lab 09/03/19 0328 09/03/19 1510 09/04/19 0342 09/04/19 0904 09/05/19 0400 09/08/19 0324  NA 136  --  137  --  136 135  K 2.4* 4.6 5.2* 4.3 4.0 3.3*  CL 107  --  109  --  108 105  CO2 22  --  22  --  23 24  GLUCOSE 129*  --  113*  --  148* 305*  BUN 13  --  14  --  16 19  CREATININE 0.88  --  0.83  --  0.85 0.86  CALCIUM 7.5*  --  7.6*  --  7.7* 7.9*  MG 1.5*  --  2.5*  --  1.9 1.5*   Liver Function Tests: No results for input(s): AST, ALT, ALKPHOS, BILITOT, PROT, ALBUMIN in the last 168 hours. No results for input(s): LIPASE, AMYLASE in the last 168 hours. No results for input(s): AMMONIA in the last 168 hours. CBC: Recent Labs  Lab 09/03/19 0328 09/04/19 0342 09/05/19 0400 09/08/19 0324  WBC 7.6 8.3 7.3 6.2  NEUTROABS  --  6.4  --  4.0  HGB 7.9* 8.4* 8.1* 7.3*  HCT 23.3* 25.5* 24.6* 22.9*  MCV 75.6* 76.8* 77.4* 79.5*  PLT 187 173 162 273   Cardiac Enzymes: No results for input(s): CKTOTAL, CKMB, CKMBINDEX, TROPONINI in the last 168 hours. BNP: Invalid input(s): POCBNP CBG: Recent Labs  Lab 09/07/19 1222 09/07/19 1729 09/07/19 2136 09/08/19 0755 09/08/19 1152  GLUCAP 130* 129* 201* 237* 286*   D-Dimer No results for input(s): DDIMER in the last 72 hours. Hgb A1c No results for input(s): HGBA1C in the last 72 hours. Lipid Profile No results for input(s): CHOL, HDL, LDLCALC, TRIG, CHOLHDL, LDLDIRECT in the last 72 hours. Thyroid function studies No results for input(s): TSH, T4TOTAL, T3FREE, THYROIDAB in the last 72 hours.  Invalid input(s): FREET3 Anemia work up No results for input(s): VITAMINB12, FOLATE, FERRITIN, TIBC, IRON, RETICCTPCT in the last 72 hours. Urinalysis    Component Value Date/Time   COLORURINE YELLOW (A) 08/21/2019 1327   APPEARANCEUR TURBID (A) 08/21/2019 1327   LABSPEC 1.015 08/21/2019 1327   PHURINE 5.0 08/21/2019 1327    GLUCOSEU >=500 (A) 08/21/2019 1327   HGBUR LARGE (A) 08/21/2019 1327   BILIRUBINUR NEGATIVE 08/21/2019 1327   BILIRUBINUR neg 05/15/2013 1038   KETONESUR NEGATIVE 08/21/2019 1327   PROTEINUR 30 (A) 08/21/2019 1327   UROBILINOGEN 0.2 05/15/2013 1038   NITRITE NEGATIVE 08/21/2019 1327   LEUKOCYTESUR LARGE (A) 08/21/2019 1327   Sepsis Labs Invalid input(s): PROCALCITONIN,  WBC,  LACTICIDVEN Microbiology Recent Results (from the past 240 hour(s))  Acid Fast Smear (AFB)     Status: None   Collection Time: 09/01/19 10:00 AM   Specimen: Tissue  Result Value Ref Range Status   AFB Specimen Processing Comment  Final    Comment: Tissue Grinding and Digestion/Decontamination   Acid Fast Smear Negative  Final    Comment: (NOTE) Performed At: [facility] [place], [place] [id number] Rush Farmer MD [id number]    Source (AFB) LUNG  Final    Comment: Performed at [facility], [place] [place], [place] [id number]  Culture, fungus without smear     Status: None (Preliminary result)   Collection Time: 09/01/19 10:00 AM   Specimen: Abscess; Other  Result Value Ref Range Status   Specimen Description   Final    ABSCESS Performed at [facility], [address], [place], [place] [address]    Special Requests   Final    LUNG Performed at [facility], [place]  [address], [place], [place] [address]    Culture   Final    NO FUNGUS ISOLATED AFTER 7 DAYS Performed at [facility], [place] [address] [place], [place] [address]    Report Status PENDING  Incomplete  Aerobic/Anaerobic Culture (surgical/deep wound)     Status: None   Collection Time: 09/01/19 10:00 AM   Specimen: Abscess; Tissue  Result Value Ref Range Status   Specimen Description   Final    ABSCESS Performed at [facility], [address] [place], [place] [address]    Special Requests   Final    LUNG Performed at [facility], [place], [place] [address]    Gram Stain NO WBC SEEN NO ORGANISMS SEEN   Final   Culture   Final    No growth aerobically or anaerobically. Performed at [facility], [place] [address], [place], [place] [address]    Report Status 09/06/2019 FINAL  Final  SARS CORONAVIRUS 2 (TAT 6-24 HRS) Nasopharyngeal Nasopharyngeal Swab     Status: None   Collection Time: 09/07/19  4:00 PM   Specimen: Nasopharyngeal Swab  Result Value Ref Range Status   SARS Coronavirus 2 NEGATIVE NEGATIVE Final    Comment: (NOTE) SARS-CoV-2 target nucleic acids are NOT DETECTED. The SARS-CoV-2 RNA is generally detectable in upper and lower respiratory specimens during the acute phase of infection. Negative results do not preclude SARS-CoV-2 infection, do not rule out co-infections with other pathogens, and should not be used as the sole basis for treatment or other patient management decisions. Negative results must be combined with clinical observations, patient history, and epidemiological information. The expected result is Negative. Fact Sheet for Patients: SugarRoll.be Fact Sheet for Healthcare Providers: https://www.woods-mathews.com/ This test is not yet approved or cleared by the [place] FDA and  has been authorized for detection and/or diagnosis of SARS-CoV-2 by FDA under an Emergency Use Authorization (EUA). This EUA will remain  in effect (meaning this test can be used) for the duration of the COVID-19 declaration under Section 56 4(b)(1) of the Act, 21 U.S.C. section 360bbb-3(b)(1), unless the authorization is terminated or revoked sooner. Performed at [facility], [place] [address] [place], [place] [address]      Time coordinating discharge: Over 30 minutes  SIGNED:   Ezekiel Slocumb, DO [facility] 09/08/2019, 1:04 PM Pager [phone]  If 7PM-7AM, please contact night-coverage [email or web address] Password  [email or web address]   Discharge summary done by Dr. Arbutus Ped no changes made apart from discharge date and patient's diet.

## 2019-09-08 NOTE — Progress Notes (Signed)
OT Cancellation Note  Patient Details Name: Trinisha Rojek MRN: [id number] DOB: [birth year]   Cancelled Treatment:    Reason Eval/Treat Not Completed: Patient at procedure or test/ unavailable. Pt being taken out of room by transport staff for testing. Will re-attempt OT tx at later date/time as appropriate.   Jeni Salles, MPH, MS, OTR/L ascom [phone] 09/08/19, 8:51 AM

## 2019-09-09 LAB — GLUCOSE, CAPILLARY
Glucose-Capillary: 137 mg/dL — ABNORMAL HIGH (ref 70–99)
Glucose-Capillary: 284 mg/dL — ABNORMAL HIGH (ref 70–99)

## 2019-09-09 NOTE — Plan of Care (Signed)
  Problem: Education: Goal: Knowledge of General Education information will improve Description: Including pain rating scale, medication(s)/side effects and non-pharmacologic comfort measures Outcome: Progressing   Problem: Clinical Measurements: Goal: Ability to maintain clinical measurements within normal limits will improve Outcome: Progressing Goal: Will remain free from infection Outcome: Progressing Goal: Diagnostic test results will improve Outcome: Progressing Goal: Respiratory complications will improve Outcome: Progressing Goal: Cardiovascular complication will be avoided Outcome: Progressing   Problem: Coping: Goal: Level of anxiety will decrease Outcome: Progressing   Problem: Elimination: Goal: Will not experience complications related to bowel motility Outcome: Progressing Goal: Will not experience complications related to urinary retention Outcome: Progressing   Problem: Pain Managment: Goal: General experience of comfort will improve Outcome: Progressing   Problem: Safety: Goal: Ability to remain free from injury will improve Outcome: Progressing   Problem: Skin Integrity: Goal: Risk for impaired skin integrity will decrease Outcome: Progressing   

## 2019-09-09 NOTE — Progress Notes (Signed)
PROGRESS NOTE                                                                                                                                                                                                             Patient Demographics:    Julia Shea, is a 63 y.o. female, DOB - 1956-05-06, [id number]  Admit date - 08/21/2019   Admitting Physician Jennye Boroughs, MD  Outpatient Primary MD for the patient is Patient, No Pcp Per  LOS - 35    Chief Complaint  Patient presents with   Hip Pain       Brief Narrative  63 year old female with history of asthma, hypertension, depression, bilateral hip pain, history of MRSA infection, insulin-dependent diabetes mellitus and CKD stage III presented with generalized weakness with nausea and vomiting and left hip pain for 1 week duration.  EMS called by neighbors and was found to be lying in feces and urine and possible bedbugs.  In the ED she was found to be hyponatremic, acute kidney injury, hyperglycemic and chest x-ray showing multiple cavitary lesions.  She underwent CT-guided biopsy on 12/1 with pathology negative for malignancy and showed acute and organizing pneumonia with fibrinous alveolitis, fragments of necro- inflammatory debris.  TEE was negative for vegetation.  Blood culture negative.  Patient without any fevers or WBC.  AFB smear negative, cultures pending.  Follow-up chest x-ray shows improvement.  Patient has received 12 days of IV Rocephin and Flagyl and plan to discharge on Flagyl and Vantin for 2 more weeks with outpatient ID follow-up.         Subjective:   Denies any symptoms.  No overnight events.   Assessment  & Plan :    Hospital course Cavitary lesions of the lung TEE negative.  Biopsy negative for malignancy.  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  AFB smear negative, fungal culture so far negative.   AFB culture pending.  QuantiFERON gold was indeterminate.  PPD negative. Prior hospital discussed with rheumatologist on the phone and recommended biopsy for diagnosis. ID consult appreciated.  Received 2 weeks of IV Rocephin and Flagyl with plan on discharge on 2 more weeks of oral Flagyl and Vantin. Follow-up with ID Dr. Delaine Lame in 2 weeks.  Generalized weakness Per family she has not been able to ambulate for almost 2 years  and unable to take care of herself with frequent falls.  PT recommends SNF.  Anemia of chronic disease/iron deficiency Received PRBC transfusion and currently stable.  Continue Continue iron supplement and folic acid.  Hypertension Stable on midodrine.  Left hip/groin pain No signs of injury on imaging.  Needs PT.  Hyponatremia Resolved with fluids  Acute kidney injury Resolved.  Type 2 diabetes mellitus with hyperglycemia, uncontrolled A1c >14.  On Lantus 6 units at bedtime and added premeal NovoLog.  Severe protein calorie malnutrition Added supplement.  Stage II decubitus ulcer of the right buttock Care per nursing.  Change position every 2 hours.  Chronic diarrhea Stable on loperamide  GERD Continue PPI   Code Status : Full code  Family Communication  : None  Disposition Plan  : Skilled nursing facility    Lab Results  Component Value Date   PLT 273 09/08/2019    Antibiotics  :    Anti-infectives (From admission, onward)   Start     Dose/Rate Route Frequency Ordered Stop   09/08/19 0000  metroNIDAZOLE (FLAGYL) 500 MG tablet  Status:  Discontinued     500 mg Oral 3 times daily 09/08/19 1304 09/08/19    09/08/19 0000  cefpodoxime (VANTIN) 200 MG tablet  Status:  Discontinued     200 mg Oral 2 times daily 09/08/19 1304 09/08/19    09/08/19 0000  cefpodoxime (VANTIN) 200 MG tablet     200 mg Oral 2 times daily 09/08/19 1608 09/22/19 2359   09/08/19 0000  metroNIDAZOLE (FLAGYL) 500 MG tablet     500 mg Oral 3 times daily  09/08/19 1608 09/22/19 2359   09/03/19 2000  Vancomycin (VANCOCIN) 1,250 mg in sodium chloride 0.9 % 250 mL IVPB  Status:  Discontinued     1,250 mg 166.7 mL/hr over 90 Minutes Intravenous Every 24 hours 09/03/19 1348 09/04/19 1544   09/02/19 2000  vancomycin (VANCOCIN) IVPB 1000 mg/200 mL premix  Status:  Discontinued     1,000 mg 200 mL/hr over 60 Minutes Intravenous Every 24 hours 09/01/19 1603 09/03/19 1348   09/01/19 1700  vancomycin (VANCOCIN) 1,250 mg in sodium chloride 0.9 % 250 mL IVPB     1,250 mg 166.7 mL/hr over 90 Minutes Intravenous  Once 09/01/19 1603 09/01/19 2030   08/28/19 1400  cefTRIAXone (ROCEPHIN) 2 g in sodium chloride 0.9 % 100 mL IVPB     2 g 200 mL/hr over 30 Minutes Intravenous Every 24 hours 08/28/19 1330     08/28/19 1400  metroNIDAZOLE (FLAGYL) IVPB 500 mg     500 mg 100 mL/hr over 60 Minutes Intravenous Every 8 hours 08/28/19 1344     08/26/19 1800  linezolid (ZYVOX) tablet 600 mg  Status:  Discontinued     600 mg Oral Every 12 hours 08/26/19 1508 09/01/19 1513   08/25/19 1000  ceFEPIme (MAXIPIME) 2 g in sodium chloride 0.9 % 100 mL IVPB  Status:  Discontinued     2 g 200 mL/hr over 30 Minutes Intravenous Every 12 hours 08/25/19 0740 08/26/19 1508   08/25/19 0745  ceFEPIme (MAXIPIME) 2 g in sodium chloride 0.9 % 100 mL IVPB  Status:  Discontinued     2 g 200 mL/hr over 30 Minutes Intravenous Every 8 hours 08/25/19 0739 08/25/19 0740   08/25/19 0730  cefTRIAXone (ROCEPHIN) 2 g in sodium chloride 0.9 % 100 mL IVPB  Status:  Discontinued     2 g 200 mL/hr over 30 Minutes Intravenous Every  24 hours 08/25/19 0715 08/25/19 0721   08/25/19 0730  cefTRIAXone (ROCEPHIN) 2 g in sodium chloride 0.9 % 100 mL IVPB  Status:  Discontinued     2 g 200 mL/hr over 30 Minutes Intravenous Every 24 hours 08/25/19 0722 08/25/19 0739   08/24/19 1400  vancomycin (VANCOCIN) IVPB 1000 mg/200 mL premix  Status:  Discontinued     1,000 mg 200 mL/hr over 60 Minutes Intravenous Every  24 hours 08/24/19 0813 08/26/19 1508   08/24/19 0815  meropenem (MERREM) 1 g in sodium chloride 0.9 % 100 mL IVPB  Status:  Discontinued     1 g 200 mL/hr over 30 Minutes Intravenous Every 8 hours 08/24/19 0811 08/25/19 0715   08/23/19 1700  vancomycin (VANCOCIN) IVPB 750 mg/150 ml premix  Status:  Discontinued     750 mg 150 mL/hr over 60 Minutes Intravenous Every 24 hours 08/22/19 1516 08/24/19 0813   08/22/19 1700  vancomycin (VANCOCIN) 1,250 mg in sodium chloride 0.9 % 250 mL IVPB     1,250 mg 166.7 mL/hr over 90 Minutes Intravenous  Once 08/22/19 1516 08/22/19 1930   08/22/19 1500  Levofloxacin (LEVAQUIN) IVPB 250 mg  Status:  Discontinued     250 mg 50 mL/hr over 60 Minutes Intravenous Every 24 hours 08/21/19 1939 08/22/19 0756   08/22/19 0815  meropenem (MERREM) 1 g in sodium chloride 0.9 % 100 mL IVPB  Status:  Discontinued     1 g 200 mL/hr over 30 Minutes Intravenous 2 times daily 08/22/19 0806 08/24/19 0811   08/22/19 0800  meropenem (MERREM) 1 g in sodium chloride 0.9 % 100 mL IVPB  Status:  Discontinued     1 g 200 mL/hr over 30 Minutes Intravenous  Once 08/22/19 0756 08/22/19 0806   08/21/19 1415  levofloxacin (LEVAQUIN) IVPB 500 mg     500 mg 100 mL/hr over 60 Minutes Intravenous  Once 08/21/19 1412 08/21/19 1612        Objective:   Vitals:   09/08/19 0425 09/08/19 1618 09/08/19 1931 09/09/19 0425  BP: (!) 114/59 115/65 (!) 115/96 133/64  Pulse: 83 86 80 83  Resp: 16 16 17 18   Temp: 99.1 F (37.3 C) 98.6 F (37 C) 98.4 F (36.9 C) 97.7 F (36.5 C)  TempSrc: Oral Oral Oral Oral  SpO2: 96% 99% 99% 98%  Weight:      Height:        Wt Readings from Last 3 Encounters:  09/07/19 52.7 kg  08/13/18 52.2 kg  03/29/18 46.9 kg     Intake/Output Summary (Last 24 hours) at 09/09/2019 1251 Last data filed at 09/09/2019 1012 Gross per 24 hour  Intake 469.4 ml  Output 1300 ml  Net -830.6 ml     Physical Exam  Gen: not in distress HEENT: Pallor present, moist  mucosa, supple neck Chest: clear b/l, no added sounds CVS: N S1&S2, no murmurs,  GI: soft, NT, ND,  Musculoskeletal: warm, no edema     Data Review:    CBC Recent Labs  Lab 09/03/19 0328 09/04/19 0342 09/05/19 0400 09/08/19 0324  WBC 7.6 8.3 7.3 6.2  HGB 7.9* 8.4* 8.1* 7.3*  HCT 23.3* 25.5* 24.6* 22.9*  PLT 187 173 162 273  MCV 75.6* 76.8* 77.4* 79.5*  MCH 25.6* 25.3* 25.5* 25.3*  MCHC 33.9 32.9 32.9 31.9  RDW 15.0 14.8 15.0 14.6  LYMPHSABS  --  1.2  --  1.3  MONOABS  --  0.5  --  0.8  EOSABS  --  0.1  --  0.1  BASOSABS  --  0.1  --  0.0    Chemistries  Recent Labs  Lab 09/03/19 0328 09/03/19 1510 09/04/19 0342 09/04/19 0904 09/05/19 0400 09/08/19 0324  NA 136  --  137  --  136 135  K 2.4* 4.6 5.2* 4.3 4.0 3.3*  CL 107  --  109  --  108 105  CO2 22  --  22  --  23 24  GLUCOSE 129*  --  113*  --  148* 305*  BUN 13  --  14  --  16 19  CREATININE 0.88  --  0.83  --  0.85 0.86  CALCIUM 7.5*  --  7.6*  --  7.7* 7.9*  MG 1.5*  --  2.5*  --  1.9 1.5*   ------------------------------------------------------------------------------------------------------------------ No results for input(s): CHOL, HDL, LDLCALC, TRIG, CHOLHDL, LDLDIRECT in the last 72 hours.  Lab Results  Component Value Date   HGBA1C 14.6 (H) 08/21/2019   ------------------------------------------------------------------------------------------------------------------ No results for input(s): TSH, T4TOTAL, T3FREE, THYROIDAB in the last 72 hours.  Invalid input(s): FREET3 ------------------------------------------------------------------------------------------------------------------ No results for input(s): VITAMINB12, FOLATE, FERRITIN, TIBC, IRON, RETICCTPCT in the last 72 hours.  Coagulation profile No results for input(s): INR, PROTIME in the last 168 hours.  No results for input(s): DDIMER in the last 72 hours.  Cardiac Enzymes No results for input(s): CKMB, TROPONINI, MYOGLOBIN in  the last 168 hours.  Invalid input(s): CK ------------------------------------------------------------------------------------------------------------------ No results found for: BNP  Inpatient Medications  Scheduled Meds:  acidophilus  1 capsule Oral Daily   ferrous sulfate  325 mg Oral Q breakfast   folic acid  1 mg Oral Daily   Gerhardt's butt cream   Topical TID   insulin aspart  0-9 Units Subcutaneous TID WC   insulin aspart  4 Units Subcutaneous TID WC   insulin glargine  6 Units Subcutaneous QHS   liver oil-zinc oxide   Topical TID   magnesium oxide  400 mg Oral Daily   midodrine  5 mg Oral TID WC   nystatin  5 mL Oral QID   pantoprazole  40 mg Oral Daily   Ensure Max Protein  11 oz Oral BID BM   Continuous Infusions:  sodium chloride Stopped (09/09/19 0625)   cefTRIAXone (ROCEPHIN)  IV Stopped (09/08/19 1940)   metronidazole 100 mL/hr at 09/09/19 0641   PRN Meds:.sodium chloride, acetaminophen **OR** acetaminophen, albuterol, loperamide  Micro Results Recent Results (from the past 240 hour(s))  Acid Fast Smear (AFB)     Status: None   Collection Time: 09/01/19 10:00 AM   Specimen: Tissue  Result Value Ref Range Status   AFB Specimen Processing Comment  Final    Comment: Tissue Grinding and Digestion/Decontamination   Acid Fast Smear Negative  Final    Comment: (NOTE) Performed At: [facility] [place], [place] [id number] Rush Farmer MD [id number]    Source (AFB) LUNG  Final    Comment: Performed at [facility], [address] [place], [place] [address]  Culture, fungus without smear     Status: None (Preliminary result)   Collection Time: 09/01/19 10:00 AM   Specimen: Abscess; Other  Result Value Ref Range Status   Specimen Description   Final    ABSCESS Performed at [facility], [address] [place], [place] [address]    Special Requests   Final    LUNG Performed at [facility], [place], [place] [address]    Culture   Final    NO FUNGUS ISOLATED AFTER 8 DAYS Performed at [facility], [place] [address] [place], [place] [address]    Report Status PENDING  Incomplete  Aerobic/Anaerobic Culture (surgical/deep wound)     Status: None   Collection Time: 09/01/19 10:00 AM   Specimen: Abscess; Tissue  Result Value Ref Range Status   Specimen Description   Final    ABSCESS Performed at [facility], [address] [place], [place] [address]    Special Requests   Final    LUNG Performed at [facility], [place], [place] [address]    Gram Stain NO WBC SEEN NO ORGANISMS SEEN   Final   Culture   Final    No growth aerobically or anaerobically. Performed at [facility], [place] [address] [place], [place] [address]    Report Status 09/06/2019 FINAL  Final  SARS CORONAVIRUS 2 (TAT 6-24 HRS) Nasopharyngeal Nasopharyngeal Swab     Status: None   Collection Time: 09/07/19  4:00 PM   Specimen: Nasopharyngeal Swab  Result Value Ref Range Status   SARS Coronavirus 2 NEGATIVE NEGATIVE Final    Comment: (NOTE) SARS-CoV-2 target nucleic acids are NOT DETECTED. The SARS-CoV-2 RNA is generally detectable in upper and lower respiratory specimens during the acute phase of infection. Negative results do not preclude SARS-CoV-2 infection, do not rule out co-infections with other pathogens, and should not be used as the sole basis for treatment or other patient management decisions. Negative results must be combined with clinical observations, patient history, and epidemiological information. The expected result is Negative. Fact Sheet for Patients: SugarRoll.be Fact Sheet for Healthcare Providers: https://www.woods-mathews.com/ This test is not yet approved or cleared by the [place] FDA and  has been authorized for detection and/or diagnosis of  SARS-CoV-2 by FDA under an Emergency Use Authorization (EUA). This EUA will remain  in effect (meaning this test can be used) for the duration of the COVID-19 declaration under Section 56 4(b)(1) of the Act, 21 U.S.C. section 360bbb-3(b)(1), unless the authorization is terminated or revoked sooner. Performed at [facility], [place] [address] [place], [place] [address]     Radiology Reports Dg Chest 2 View  Result Date: 09/08/2019 CLINICAL DATA:  Pulmonary infiltrates. EXAM: CHEST - 2 VIEW COMPARISON:  Chest x-ray dated 09/01/2019 and 08/28/2019 and CT scan dated 09/01/2019 FINDINGS: There has been marked improvement in the multiple bilateral pulmonary nodular densities with significant decrease in the size of the cavitary lesion in the right upper lobe. Stable lesion in the left upper lobe. Heart size and vascularity are normal. Small bilateral pleural effusions persist. No bone abnormality. Aortic atherosclerosis. IMPRESSION: 1. Significant partial clearing of the multiple bilateral densities including a decreased size of the cavitary lesion in the right upper lobe. 2. No change in the cavitary lesion in the left upper lobe. 3. Persistent small bilateral effusions 4. aortic Atherosclerosis (ICD10-I70.0). Electronically Signed   By: Lorriane Shire M.D.   On: 09/08/2019 09:12   Dg Pelvis 1-2 Views  Result Date: 08/27/2019 CLINICAL DATA:  Left groin pain and bilateral hip pain. EXAM: PELVIS - 1-2 VIEW COMPARISON:  08/21/2019 FINDINGS: Mild symmetric degenerative change of the hips. No acute fracture or dislocation. Degenerative change of the spine. Remainder the exam is unchanged. IMPRESSION: No acute findings. Electronically Signed   By: Marin Olp M.D.  On: 08/27/2019 12:36   Ct Chest Wo Contrast  Result Date: 08/30/2019 CLINICAL DATA:  Cavitary masses seen in the lungs on August 21, 2019, favored to be infectious. Follow-up. EXAM: CT CHEST WITHOUT CONTRAST TECHNIQUE:  Multidetector CT imaging of the chest was performed following the standard protocol without IV contrast. COMPARISON:  CT of the chest August 21, 2019. Chest x-ray August 28, 2019. FINDINGS: Cardiovascular: Atherosclerotic changes are seen in the nonaneurysmal aorta. Central pulmonary arteries are normal in caliber. Coronary artery calcifications involve the LAD and circumflex branches of the left coronary artery. The heart is unchanged. Mediastinum/Nodes: There are new small bilateral pleural effusions with associated atelectasis. The effusions partially obscure some of the cavitary nodules in the bases. No pericardial effusion is noted. The thyroid is unremarkable. No adenopathy. Lungs/Pleura: Bilateral cavitary nodules and masses are again identified. The cavitary mass seen on series 3, image 37 measures 6.3 by 4.0 cm today versus 5.9 x 3.5 cm previously. There is less air and more fluid/debris within this mass today. The cavitary mass in the periphery of the right upper lobe is seen on series 3, image 34 measures 5.6 x 3.3 cm today versus 3.6 x 3.5 cm previously. This cavitary mass is clearly larger in the interval. There is some surrounding pulmonary opacity which could represent additional infection. Specifically, there is opacity with air bronchograms just inferior medial to this cavitary mass is seen on series 3, image 44 which was not present previously. The cavitary mass seen on series 2, image 79 of the previous study is partially obscured by pleural fluid today making comparison difficult. However, this mass measures at least 4.7 x 2.8 cm today versus 5.2 x 2.4 cm previously. The adjacent cavitary mass located just superiorly is partially obscured by pleural fluid as well measuring at least 2.8 by 2.2 cm today versus 2.6 x 1.9 cm previously. There is a cluster of cavitary nodules in the left base adjacent to the diaphragm. Several of these nodules appear to have merged measuring 3.8 x 2.8 cm today.  The largest measured 2.9 x 2.1 cm previously. A new tiny nodule is seen in the left upper lobe on axial image 53. There is a new nodule seen in the left base on image 100. A new nodule is seen in the right base on image 90. A few other new nodules are noted. Several previously identified the nodules which were not measured on the previous study are larger today. Upper Abdomen: No acute abnormality. Musculoskeletal: No chest wall mass or suspicious bone lesions identified. IMPRESSION: 1. Numerous bilateral cavitary nodules and masses are identified. There are several new nodules. Most of the previously identified nodules and masses are larger. The findings could be due to atypical infection, cavitary malignancy, or septic emboli. The patient is scheduled for bronchoscopy. 2. Atherosclerotic change in the thoracic aorta. Coronary artery calcifications. 3. Bilateral pleural effusions. 4. Aortic Atherosclerosis (ICD10-I70.0). Electronically Signed   By: Dorise Bullion III M.D   On: 08/30/2019 15:14   Ct Chest Wo Contrast  Result Date: 08/21/2019 CLINICAL DATA:  Cough.  Persistent left lung cavitary lesion. EXAM: CT CHEST WITHOUT CONTRAST TECHNIQUE: Multidetector CT imaging of the chest was performed following the standard protocol without IV contrast. COMPARISON:  Prior chest radiographs. FINDINGS: Cardiovascular: Heart is normal in size and configuration. No pericardial effusion. Two vessel coronary artery calcifications. Aortic atherosclerotic calcifications. Mediastinum/Nodes: No enlarged mediastinal or axillary lymph nodes. Thyroid gland, trachea, and esophagus demonstrate no significant findings. Lungs/Pleura: Multiple  cavitary lesion/masses and non cavitary nodules. In the left upper lobe, there is a cavitary lesion that measures 5.9 x 3.5 cm transversely, centered on image 49, series 2. Cavities contain dependent fluid. There is hazy airspace opacity along the periphery of the lesion mostly at superior  aspect. In the right upper lobe there is a cavitary mass/lesion measuring 3.6 x 3.5 cm, centered on image 32, series 2. The central cavity contains dependent fluid. There is hazy airspace opacity surrounding the periphery of this lesion. In the posterior right lower lobe there is a cavitary mass/lesion measuring 5.2 x 2.4 cm containing bubbles of air, centered on image 79, series 2. A second cavitary lesion is seen superior to this measuring 2.6 x 1.9 cm. Hazy ground-glass opacity also is seen along the periphery of these lesions. In the left lower lobe abutting the inferior oblique fissure, image 105, series 2, there is a nodule measuring 2.9 x 2.1 cm containing a single small bubble of nondependent air. There are several other lung nodules mostly in the lower lobes. Remainder of the lungs is clear. No evidence of pulmonary edema. No pleural effusion or pneumothorax. Upper Abdomen: No acute abnormality. Musculoskeletal: No fracture or acute finding. No osteoblastic or osteolytic lesions. IMPRESSION: 1. Multiple cavitary masses/lesions and lung nodules as detailed above. Suspect the etiology is infectious/inflammatory. Larger cavitary lesions contain dependent fluid supporting infection. Consider atypical infections including fungal and mycobacterial. Recommend follow-up chest CT after treatment to assess response. 2. No other acute abnormality within the chest. 3. Coronary artery calcifications and aortic atherosclerosis. Aortic Atherosclerosis (ICD10-I70.0). Electronically Signed   By: Lajean Manes M.D.   On: 08/21/2019 18:14   Ct Guided Needle Placement  Result Date: 09/01/2019 INDICATION: Multiple cavitary lesions. EXAM: CT-DIRECTED BIOPSY. MEDICATIONS: None. ANESTHESIA/SEDATION: None. FLUOROSCOPY TIME:  Fluoroscopy Time: None. COMPLICATIONS: None immediate. PROCEDURE: After discussing the risks and benefits of this procedure with the patient informed consent was obtained. Airborne disease precautions were  taken. Right back was sterilely prepped and draped. Following local anesthesia with 1% lidocaine a 18 gauge core biopsy system was advanced into the right posterior pleural-based lung mass without complication. Three separate core biopsies obtained, two 23 mm and one 13 mm, and sent to pathology for histologic evaluation. Two separate 13 mm samples obtained and mixed with saline and sent to microbiology for aerobic and anaerobic cultures and sensitivities, TB and fungal studies. Post biopsy CT was unremarkable. IMPRESSION: Successful CT-directed biopsy of right posterior lung mass in this patient with known multiple cavitary lesions. Multiple 18 gauge core samples sent to pathology for histologic and microbiologic evaluations. Electronically Signed   By: Marcello Moores  Register   On: 09/01/2019 14:26   Dg Chest Port 1 View  Result Date: 09/01/2019 CLINICAL DATA:  Lung mass biopsy. EXAM: PORTABLE CHEST 1 VIEW COMPARISON:  CT 08/30/2019.  Chest x-ray 08/28/2019. FINDINGS: Mediastinum and hilar structures are stable. Heart size stable. Multiple cavitary mass is again noted both lungs. No evidence of pneumothorax post biopsy. Small left pleural effusion. No acute bony abnormality. IMPRESSION: No evidence of pneumothorax post biopsy. Electronically Signed   By: Marcello Moores  Register   On: 09/01/2019 15:51   Dg Chest Port 1 View  Result Date: 08/28/2019 CLINICAL DATA:  Cavitary lung disease. EXAM: PORTABLE CHEST 1 VIEW COMPARISON:  CT chest and chest x-ray dated August 21, 2019. FINDINGS: The heart size and mediastinal contours are within normal limits. Atherosclerotic calcification of the aortic arch. Normal pulmonary vascularity. Large bilateral upper lobe cavitary  lesions again noted. Patchy nodular densities at the lung bases appear worsened. No pleural effusion or pneumothorax. No acute osseous abnormality. IMPRESSION: 1. Slightly worsened nodular densities at both lung bases with unchanged dominant bilateral upper  lobe cavitary nodules. Electronically Signed   By: Titus Dubin M.D.   On: 08/28/2019 09:47   Dg Chest Portable 1 View  Result Date: 08/21/2019 CLINICAL DATA:  Cough. EXAM: PORTABLE CHEST 1 VIEW COMPARISON:  January 23, 2018 FINDINGS: The patient has multiple cavitary lesions in the left lung as well as multiple other poorly defined patchy lesions in the right lung and in the left lung base. The heart size and pulmonary vascularity are normal. No discrete effusions. Aortic atherosclerosis. No significant bone abnormality. There also is suggestion of a mass in the left breast. IMPRESSION: 1. Multiple cavitary lesions in the left lung and in the left lung base. The differential diagnosis includes cavitary malignancy, septic emboli, and atypical infection. 2. Possible mass in the left breast. 3. Aortic atherosclerosis. Electronically Signed   By: Lorriane Shire M.D.   On: 08/21/2019 14:20   Dg Hip Unilat With Pelvis 2-3 Views Left  Result Date: 08/21/2019 CLINICAL DATA:  Bilateral hip pain since a fall 1 week ago. Unable to bear weight. EXAM: DG HIP (WITH OR WITHOUT PELVIS) 2-3V LEFT COMPARISON:  Radiographs dated 11/13/2017 FINDINGS: There is no evidence of hip fracture or dislocation. There is no evidence of arthropathy or other focal bone abnormality. IMPRESSION: Negative. Electronically Signed   By: Lorriane Shire M.D.   On: 08/21/2019 14:17    Time Spent in minutes  25   Miranda Garber M.D on 09/09/2019 at 12:51 PM  Between 7am to 7pm - Pager - [phone]  After 7pm go to [email or web address] - password [facility]  [facility] -  Office  [phone]

## 2019-09-09 NOTE — TOC Transition Note (Signed)
Transition of Care [facility]) - CM/SW Discharge Note   Patient Details  Name: Mikinley Ryle MRN: [id number] Date of Birth: [birth year]  Transition of Care [facility]) CM/SW Contact:  Candie Chroman, LCSW Phone Number: 09/09/2019, 4:07 PM   Clinical Narrative:  Patient has orders to discharge to [facility] today. Patient and her sister-in-law aware. RN will call report to [phone] prior to setting up EMS transportation. No further concerns. CSW signing off.   Final next level of care: Skilled Nursing Facility Barriers to Discharge: Barriers Resolved   Patient Goals and CMS Choice   CMS Medicare.gov Compare Post Acute Care list provided to:: Patient Represenative (must comment) Choice offered to / list presented to : Sibling  Discharge Placement              Patient chooses bed at: [facility] Patient to be transferred to facility by: EMS Name of family member notified: [name] Topete Patient and family notified of of transfer: 09/09/19  Discharge Plan and Services In-house Referral: Clinical Social Work Discharge Planning Services: [employer] Consult Post Acute Care Choice: [place]                               Social Determinants of Health (SDOH) Interventions     Readmission Risk Interventions No flowsheet data found.

## 2019-09-09 NOTE — Progress Notes (Signed)
Patient transported to [facility] via EMS.  Clarise Cruz, BSN

## 2019-09-09 NOTE — Progress Notes (Signed)
Physical Therapy Treatment Patient Details Name: Julia Shea MRN: [id number] DOB: [birth year] Today's Date: 09/09/2019    History of Present Illness Skyelyn Billingslea is a 63 y.o. F with HTN, DM II, Depression, frequent falls, and asthma who was admitted 11/20 with N/V, generalized weakness and left hip pain. EMS were called for a welfare check by neighbors, reported finding her lying in feces, urine and possible bedbugs.     PT Comments    Pt presented with deficits in strength, transfers, mobility, gait, balance, and activity tolerance.  Pt motivated to participate with therapy throughout the session.  Pt continued to present with significant functional weakness.  Pt required assistance for bed mobility tasks and even with max A was unable to come to standing at the EOB.  Pt education/reinforcement provided regarding positioning and HEP to address deficits in B knee ext ROM.  Pt will benefit from PT services in a SNF setting upon discharge to safely address above deficits for decreased caregiver assistance and eventual return to PLOF.      Follow Up Recommendations  SNF     Equipment Recommendations  Rolling walker with 5" wheels    Recommendations for Other Services       Precautions / Restrictions Precautions Precautions: Fall Restrictions Weight Bearing Restrictions: No    Mobility  Bed Mobility Overal bed mobility: Needs Assistance Bed Mobility: Supine to Sit;Sit to Supine     Supine to sit: Min assist Sit to supine: Min assist   General bed mobility comments: Min A and min verbal cues for sequencing  Transfers Overall transfer level: Needs assistance   Transfers: Lateral/Scoot Transfers          Lateral/Scoot Transfers: Max assist General transfer comment: Multiple attempts to stand from various bed heights and max A with pt unable to clear the surface of the mattress; pt able to laterally scoot with max A  Ambulation/Gait             General Gait  Details: unable   Stairs             Wheelchair Mobility    Modified Rankin (Stroke Patients Only)       Balance Overall balance assessment: Needs assistance Sitting-balance support: No upper extremity supported;Feet supported Sitting balance-Leahy Scale: Fair         Standing balance comment: Unable                            Cognition Arousal/Alertness: Awake/alert Behavior During Therapy: WFL for tasks assessed/performed Overall Cognitive Status: Within Functional Limits for tasks assessed                                        Exercises Total Joint Exercises Ankle Circles/Pumps: Strengthening;Both;10 reps;15 reps Quad Sets: Strengthening;Both;10 reps;15 reps;AROM Gluteal Sets: Strengthening;Both;10 reps;15 reps Towel Squeeze: Strengthening;Both;10 reps Heel Slides: AROM;Both;10 reps Hip ABduction/ADduction: AAROM;AROM;Both;10 reps;15 reps Straight Leg Raises: AROM;AAROM;Both;10 reps;15 reps Long Arc Quad: Strengthening;10 reps;Both;15 reps Knee Flexion: Strengthening;Both;10 reps;15 reps Other Exercises: B knee ext PROM  Other Exercises: Supine B leg press with manual resistance 2 x 10 Other Exercises: 2 x 5 sit to/from stand attempts with pt unable to clear surface of mattress    General Comments        Pertinent Vitals/Pain Pain Assessment: No/denies pain    Home Living  Prior Function            PT Goals (current goals can now be found in the care plan section) Progress towards PT goals: Not progressing toward goals - comment(limited by functional weakness)    Frequency    Min 2X/week      PT Plan Current plan remains appropriate    Co-evaluation              AM-PAC PT "6 Clicks" Mobility   Outcome Measure  Help needed turning from your back to your side while in a flat bed without using bedrails?: A Little Help needed moving from lying on your back to sitting on the  side of a flat bed without using bedrails?: A Little Help needed moving to and from a bed to a chair (including a wheelchair)?: Total Help needed standing up from a chair using your arms (e.g., wheelchair or bedside chair)?: Total Help needed to walk in hospital room?: Total Help needed climbing 3-5 steps with a railing? : Total 6 Click Score: 10    End of Session Equipment Utilized During Treatment: Gait belt Activity Tolerance: Patient tolerated treatment well Patient left: in bed;with bed alarm set;with call bell/phone within reach Nurse Communication: Mobility status PT Visit Diagnosis: Muscle weakness (generalized) (M62.81);History of falling (Z91.81);Difficulty in walking, not elsewhere classified (R26.2);Pain Pain - Right/Left: Left Pain - part of body: Hip     Time: 0936-1000 PT Time Calculation (min) (ACUTE ONLY): 24 min  Charges:  $Therapeutic Exercise: 23-37 mins                     D. Scott Xolani Degracia PT, DPT 09/09/19, 1:12 PM

## 2019-09-09 NOTE — Progress Notes (Signed)
Occupational Therapy Treatment Patient Details Name: Julia Shea MRN: [id number] DOB: 08/27/1956 Today's Date: 09/09/2019    History of present illness Ori Weipert is a 63 y.o. F with HTN, DM II, Depression, frequent falls, and asthma who was admitted 11/20 with N/V, generalized weakness and left hip pain. EMS were called for a welfare check by neighbors, reported finding her lying in feces, urine and possible bedbugs.    OT comments  Ms. Mcclintock was seen for OT treatment on this date. Upon arrival to room pt awake/alert, semi-supine in bed, agreeable to OT tx session. Pt comes to sitting EOB given min assist from this therapist. OT engages pt in ADL tasks listed below. See below for additional details. Pt continues to be functionally limited by pain and flexion contractures in BLE/knees this date. Despite functional challenges pt is able to maintain balance seated EOB to complete grooming/bathing tasks for >20 min this date with intermittent CGA to maintain seated balance and set-up/supervision from this therapist. Pt progressing toward goals with POC updated this date to reflect pt progress and functional status. Pt continues to benefit from skilled OT services to maximize return to PLOF and minimize risk of future falls, injury, caregiver burden, and readmission.  Discharge recommendation remains appropriate.    Follow Up Recommendations  SNF    Equipment Recommendations  3 in 1 bedside commode    Recommendations for Other Services      Precautions / Restrictions Precautions Precautions: Fall Precaution Comments: airborne precautions d/c'd Restrictions Weight Bearing Restrictions: No       Mobility Bed Mobility Overal bed mobility: Needs Assistance Bed Mobility: Supine to Sit;Sit to Supine     Supine to sit: Min assist Sit to supine: Min assist   General bed mobility comments: Min a for boost up in bed. Pt able to bridge to push self toward HOB this  date.  Transfers Overall transfer level: Needs assistance   Transfers: Lateral/Scoot Transfers          Lateral/Scoot Transfers: Max assist General transfer comment: Deferred for pt comfort.    Balance Overall balance assessment: Needs assistance Sitting-balance support: No upper extremity supported;Feet unsupported;Single extremity supported Sitting balance-Leahy Scale: Good Sitting balance - Comments: Pt steady sitting EOB completing functional tasks. No foot support and occasional BUE unsupported this date.       Standing balance comment: Unable                           ADL either performed or assessed with clinical judgement   ADL Overall ADL's : Needs assistance/impaired Eating/Feeding: Modified independent   Grooming: Sitting;Set up;Supervision/safety;Moderate assistance;Wash/dry face;Brushing hair Grooming Details (indicate cue type and reason): Ot provides set-up/close supervision for pt safety as she sits EOB to complete seated grooming tasks and hair washing this date. Pt demonstrates fair seated balance with feet not consistently touching the floor this date. Upper Body Bathing: Sitting;Set up;Minimal assistance Upper Body Bathing Details (indicate cue type and reason): Pt uses BUE to don a shampoo cap while seated EOB this date. OT provides min assist to support pt with cap placement. Pt uses BUE to wash hair with cap on head and then combs hair after washing. Pt requires close supervision and occasional min guard to maintain seated balance.                         Functional mobility during ADLs: Moderate assistance;Maximal assistance General ADL   Comments: Pt unable to complete functional mobility. Continues to be functionally limited by knee flexion contractures and BLE pain. Would require squat pivot transfers to WC.     Vision Baseline Vision/History: Cataracts;Legally blind Patient Visual Report: No change from baseline     Perception      Praxis      Cognition Arousal/Alertness: Awake/alert Behavior During Therapy: WFL for tasks assessed/performed Overall Cognitive Status: Within Functional Limits for tasks assessed                                          Exercises Total Joint Exercises Ankle Circles/Pumps: Strengthening;Both;10 reps;15 reps Quad Sets: Strengthening;Both;10 reps;15 reps;AROM Gluteal Sets: Strengthening;Both;10 reps;15 reps Towel Squeeze: Strengthening;Both;10 reps Heel Slides: AROM;Both;10 reps Hip ABduction/ADduction: AAROM;AROM;Both;10 reps;15 reps Straight Leg Raises: AROM;AAROM;Both;10 reps;15 reps Long Arc Quad: Strengthening;10 reps;Both;15 reps Knee Flexion: Strengthening;Both;10 reps;15 reps Other Exercises Other Exercises: Supine B leg press with manual resistance 2 x 10 Other Exercises: 2 x 5 sit to/from stand attempts with pt unable to clear surface of mattress Other Exercises: OT engages pt in functional ADL tasks listed above. Pt requires set-up to CGA assist during seated grooming and UB bathing tasks.   Shoulder Instructions       General Comments      Pertinent Vitals/ Pain       Pain Assessment: No/denies pain Faces Pain Scale: Hurts a little bit Pain Location: BLE with mobility attempts Pain Descriptors / Indicators: Aching;Sore Pain Intervention(s): Limited activity within patient's tolerance;Monitored during session;Repositioned  Home Living                                          Prior Functioning/Environment              Frequency  Min 1X/week        Progress Toward Goals  OT Goals(current goals can now be found in the care plan section)  Progress towards OT goals: Goals met and updated - see care plan  Acute Rehab OT Goals Patient Stated Goal: get some rest and get better OT Goal Formulation: With patient Time For Goal Achievement: 09/23/19 ADL Goals Pt Will Perform Grooming: sitting;with modified  independence Pt Will Perform Lower Body Dressing: with min assist;sitting/lateral leans Pt Will Transfer to Toilet: with min assist;squat pivot transfer;bedside commode  Plan Discharge plan remains appropriate;Frequency remains appropriate    Co-evaluation                 AM-PAC OT "6 Clicks" Daily Activity     Outcome Measure   Help from another person eating meals?: None Help from another person taking care of personal grooming?: A Little Help from another person toileting, which includes using toliet, bedpan, or urinal?: A Lot Help from another person bathing (including washing, rinsing, drying)?: A Lot Help from another person to put on and taking off regular upper body clothing?: A Lot Help from another person to put on and taking off regular lower body clothing?: A Lot 6 Click Score: 15    End of Session    OT Visit Diagnosis: Other abnormalities of gait and mobility (R26.89);Repeated falls (R29.6);Muscle weakness (generalized) (M62.81)   Activity Tolerance Patient tolerated treatment well   Patient Left in bed;with call bell/phone within reach;with bed alarm set  Nurse Communication          Time: 4270-6237 OT Time Calculation (min): 28 min  Charges: OT General Charges $OT Visit: 1 Visit OT Treatments $Self Care/Home Management : 23-37 mins  Shara Blazing, M.S., OTR/L Ascom: [phone] 09/09/19, 4:34 PM

## 2019-09-09 NOTE — Progress Notes (Signed)
Speech Language Pathology Treatment: Dysphagia  Patient Details Name: Julia Shea MRN: [id number] DOB: May 04, 1956 Today's Date: 09/09/2019 Time: 0825-0910 SLP Time Calculation (min) (ACUTE ONLY): 45 min  Assessment / Plan / Recommendation Clinical Impression  Pt seen today for f/u assessment of toleration of diet of Minced foods/diet and thin liquids w/ trials to upgrade diet if able; education on general aspiration precautions including sitting fully upright when eating/drinking and slowing down when eating/drinking. Pt benefits from less distractions in her environment during oral intake; less talking. NSG staff reported good toleration of oral intake at meals. Noted stable labs and vitals.  Pt was talkative and easily engaged w/ SLP; distracted at times but was easily redirected w/ verbal cues. She consumed trials of thin liquids via Cup/Straw w/ no overt s/s of aspiration noted; No wet vocal quality or decline in respiratory effort/status noted b/t or post trials. No overt coughing. Pt was instructed on Not talking during/immediately after the po trials. Oral motor movements/function appeared [facility] w/ no oral weakness noted during bolus management and oral clearing of broken down foods and the 5 trials of softened solids. Pt fed self w/ setup support d/t overall weakness. Verbal education and practice w/ general aspiration precautions was given. Pt gave verbal agreement to slowing down and sitting up when eating/drinking at meals; less talking during eating.  Recommend a modified diet of Dysphagia level 3 (Mech Soft foods) w/ level 2 Minced Meats w/ gravies, and Thin liquids; general aspiration precautions; tray setup support and monitoring at meals as needed. Reduce Distractions at meals. Recommend Pills given in Puree IF ANY difficulty swallowing w/ liquids. NSG to reconsult ST services if any decline in status while admitted. No further services indicated as pt appears at her baseline - Poor  Dentition status at Baseline for sufficient mastication of hard solids. NSG updated.      HPI HPI: Pt is an 63 y.o. female with history of asthma, bilateral hip pain, depression, hypertension, history of MRSA infection, insulin-dependent diabetes mellitus, CKD stage III.  She presented to the hospital because of nausea, vomiting, generalized weakness and left hip pain.  She has had pain in her left hip for about a week.  She does not remember whether she had a fall or not.  Pain was quite severe.  It was nonradiating and there are no known relieving or aggravating factors.  She says that she had not been able to walk for about a week now and she was laying in bed all the time.  Reportedly, EMS went to her house, she was found laying in feces and urine and she was covered/surrounded by what appeared to be bedbugs.  She said she has been vomiting intermittently for about a week now she feels very weak and tired.  She has an occasional cough productive of clear sputum.  She said she has lost some weight but she does not know how much she has lost.  She has poor oral intake but she has been trying to drink some soup.  Pt was admitted w/ dxs of acute UTI, Acute hyponatremia likely due to dehydration, AKI on CKD stage III: AKI is likely prerenal from dehydration, IDDM with severe hyperglycemia, Multiple cavitary lesions in the left lung, Low BMI/severe protein, malnutrition, and possible left Breast mass. Pt was cleared of airborne isolation for r/o of TB.       SLP Plan  All goals met       Recommendations  Diet recommendations: Dysphagia 3 (mechanical  soft);Thin liquid(MINCED meats w/ gravies) Liquids provided via: Cup;Straw Medication Administration: Whole meds with puree(as needed for safer swallowing) Supervision: Patient able to self feed;Intermittent supervision to cue for compensatory strategies Compensations: Minimize environmental distractions;Slow rate;Small sips/bites;Lingual sweep for  clearance of pocketing;Multiple dry swallows after each bite/sip;Follow solids with liquid Postural Changes and/or Swallow Maneuvers: Seated upright 90 degrees;Upright 30-60 min after meal                General recommendations: (Dietician f/u) Oral Care Recommendations: Oral care BID;Staff/trained caregiver to provide oral care Follow up Recommendations: None SLP Visit Diagnosis: Dysphagia, oral phase (R13.11)(baseline poor Dentition status) Plan: All goals met       GO                 [name], MS, CCC-SLP Jessicalynn Deshong 09/09/2019, 11:45 AM

## 2019-09-09 NOTE — TOC Progression Note (Addendum)
Transition of Care [facility]) - Progression Note    Patient Details  Name: Julia Shea MRN: [id number] Date of Birth: 01/02/1956  Transition of Care [facility]) CM/SW [place], LCSW Phone Number: 09/09/2019, 11:42 AM  Clinical Narrative: Patient has an open worker's comp claim that will affect insurance paying for SNF placement. CSW notified patient and she asked that CSW give the information to her sister-in-law. Information provided. (Call April Lassiter at [phone] ext [phone]. Claim # [id number]).     2:17 pm: Patient's sister-in-law talked to [place] at workers comp and she said the case has been closed since 2018 and it may be that Medicare hasn't finished putting something in their system to close it on their end. Left message for SNF admissions coordinator to notify.  Expected Discharge Plan: Skilled Nursing Facility Barriers to Discharge: Barriers Resolved  Expected Discharge Plan and Services Expected Discharge Plan: [place] In-house Referral: Clinical Social Work Discharge Planning Services: CM Consult Post Acute Care Choice: [place] Living arrangements for the past 2 months: Apartment Expected Discharge Date: 09/09/19                                     Social Determinants of Health (SDOH) Interventions    Readmission Risk Interventions No flowsheet data found.

## 2019-09-09 NOTE — Progress Notes (Signed)
Called [facility] 1 hr  Earlier but  Was told nurse  Had to call me back. I just called again and spoke with  Ms parks lpn at [facility] and gave report on pt.  Ems called an hr ago and pt is number 5 on the list.  Sl d/cd. Pt resting quietly.

## 2019-09-15 ENCOUNTER — Telehealth: Payer: Self-pay | Admitting: Infectious Diseases

## 2019-09-15 NOTE — Telephone Encounter (Signed)
-----   Message from Tsosie Billing, MD sent at 09/10/2019  4:01 PM EST ----- Needs a follow up appt after chrsitmas

## 2019-09-18 ENCOUNTER — Telehealth: Payer: Self-pay

## 2019-09-18 NOTE — Telephone Encounter (Signed)
-----   Message from Tsosie Billing, MD sent at 09/10/2019  4:01 PM EST ----- Needs a follow up appt after chrsitmas

## 2019-09-18 NOTE — Telephone Encounter (Signed)
LM to call and get on schedule. This is second VM left.

## 2019-09-22 LAB — CULTURE, FUNGUS WITHOUT SMEAR: Culture: NO GROWTH

## 2019-09-29 ENCOUNTER — Telehealth: Payer: Self-pay | Admitting: Infectious Diseases

## 2019-09-29 NOTE — Telephone Encounter (Signed)
Pls continue to call

## 2019-09-29 NOTE — Telephone Encounter (Signed)
[place] to get appt scheduled - Diane will call back when she is back in office to schedule appt

## 2019-09-29 NOTE — Telephone Encounter (Signed)
-----   Message from Lazarus Gowda, [place] sent at 09/23/2019  2:01 PM EST ----- Regarding: appt to make after holidays  ----- Message ----- From: Tsosie Billing, MD Sent: 09/10/2019   4:01 PM EST To: Lazarus Gowda, CMA, [place] a follow up appt after chrsitmas

## 2019-10-08 ENCOUNTER — Ambulatory Visit: Payer: Medicare Other | Admitting: Infectious Diseases

## 2019-10-15 LAB — ACID FAST CULTURE WITH REFLEXED SENSITIVITIES (MYCOBACTERIA): Acid Fast Culture: NEGATIVE

## 2019-10-17 IMAGING — CR DG HIP (WITH OR WITHOUT PELVIS) 2-3V*L*
3 series · 3 of 3 positions shown · non-contrast
Comparison: None.

CLINICAL DATA: Bilateral hip pain

EXAM:
DG HIP (WITH OR WITHOUT PELVIS) 2-3V LEFT

[pelvis ap]
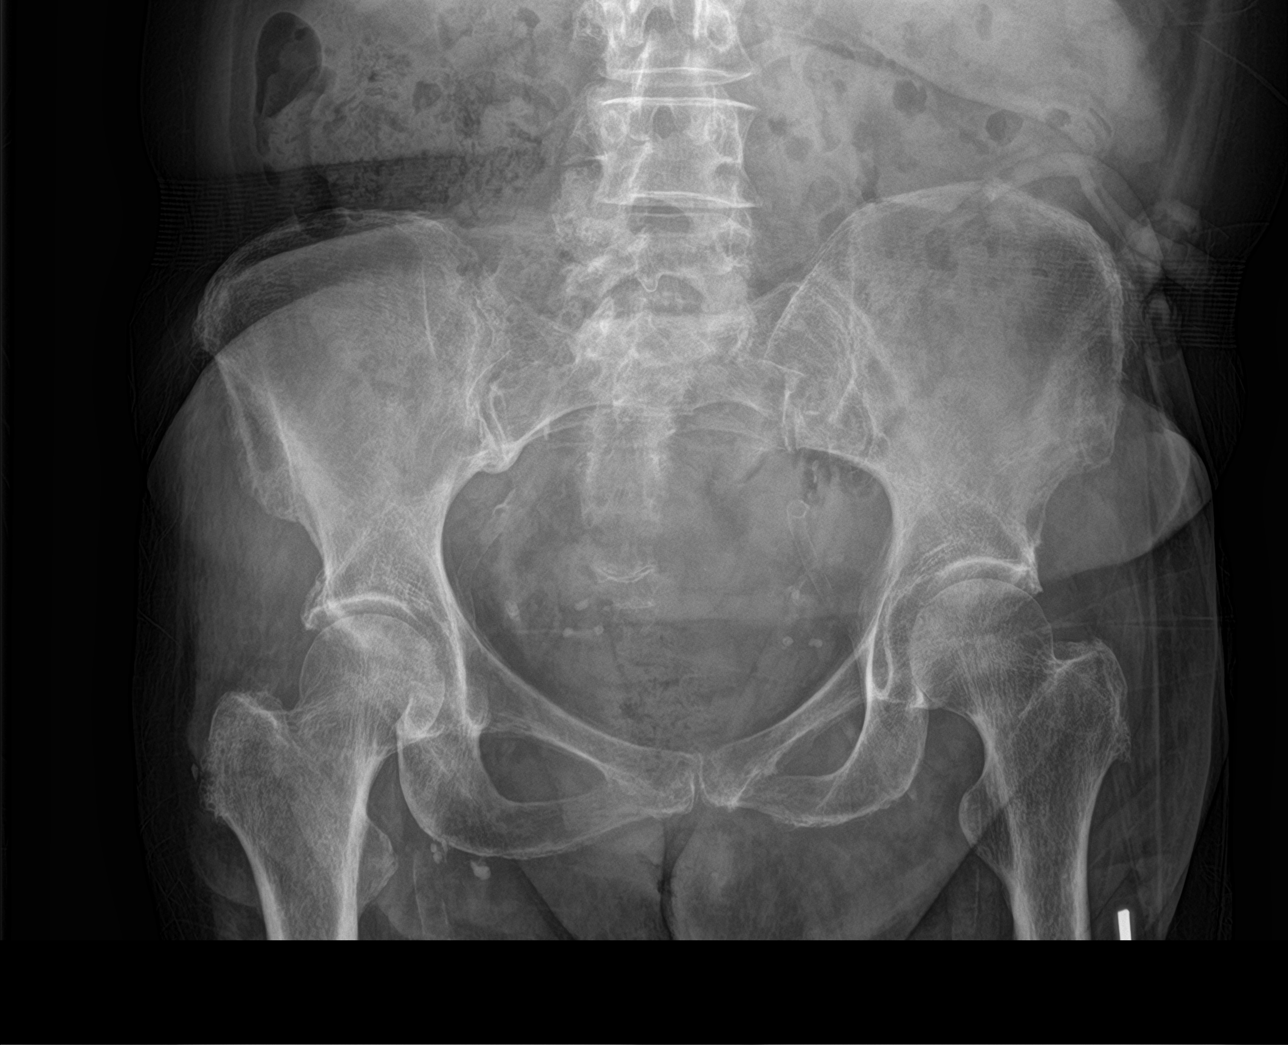

[hip lat]
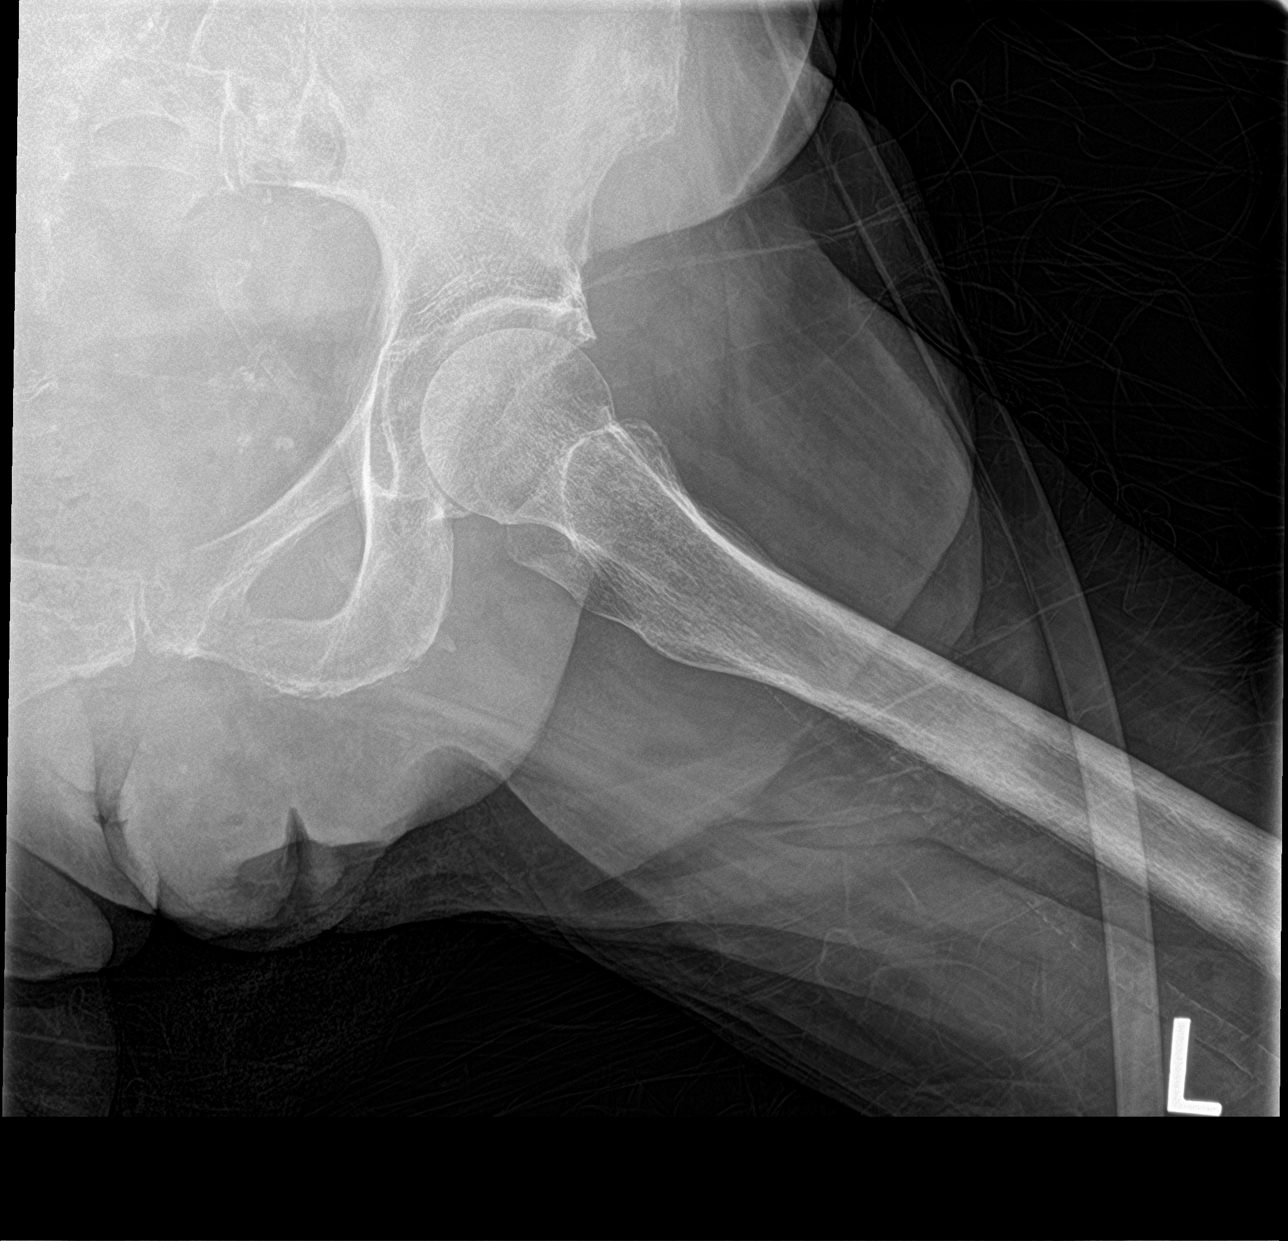

[hip ap]
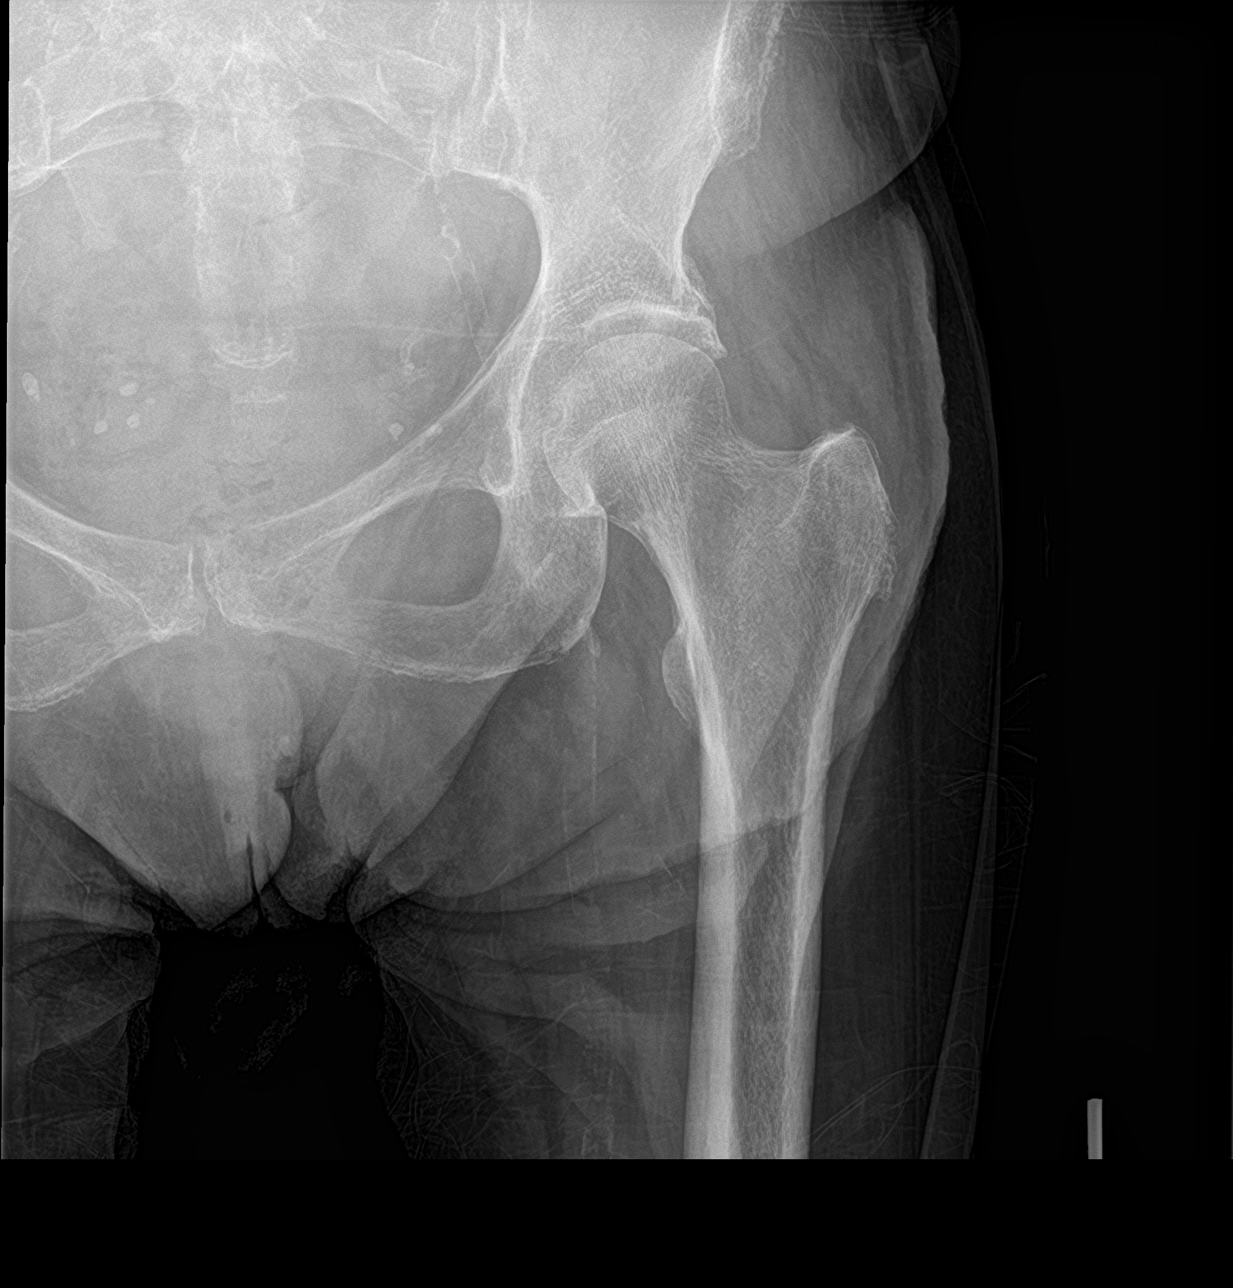

[3 of 3 positions shown; findings below may reference images not displayed]

FINDINGS: Negative for fracture or bone lesion. No significant degenerative
change. Mild arterial calcification.
IMPRESSION: Negative.

## 2019-10-17 IMAGING — CR DG HIP (WITH OR WITHOUT PELVIS) 2-3V*R*
3 series · 3 of 3 positions shown · non-contrast
Comparison: None

CLINICAL DATA: Back pain bilateral hip pain

EXAM:
DG HIP (WITH OR WITHOUT PELVIS) 2-3V RIGHT

[hip ap]
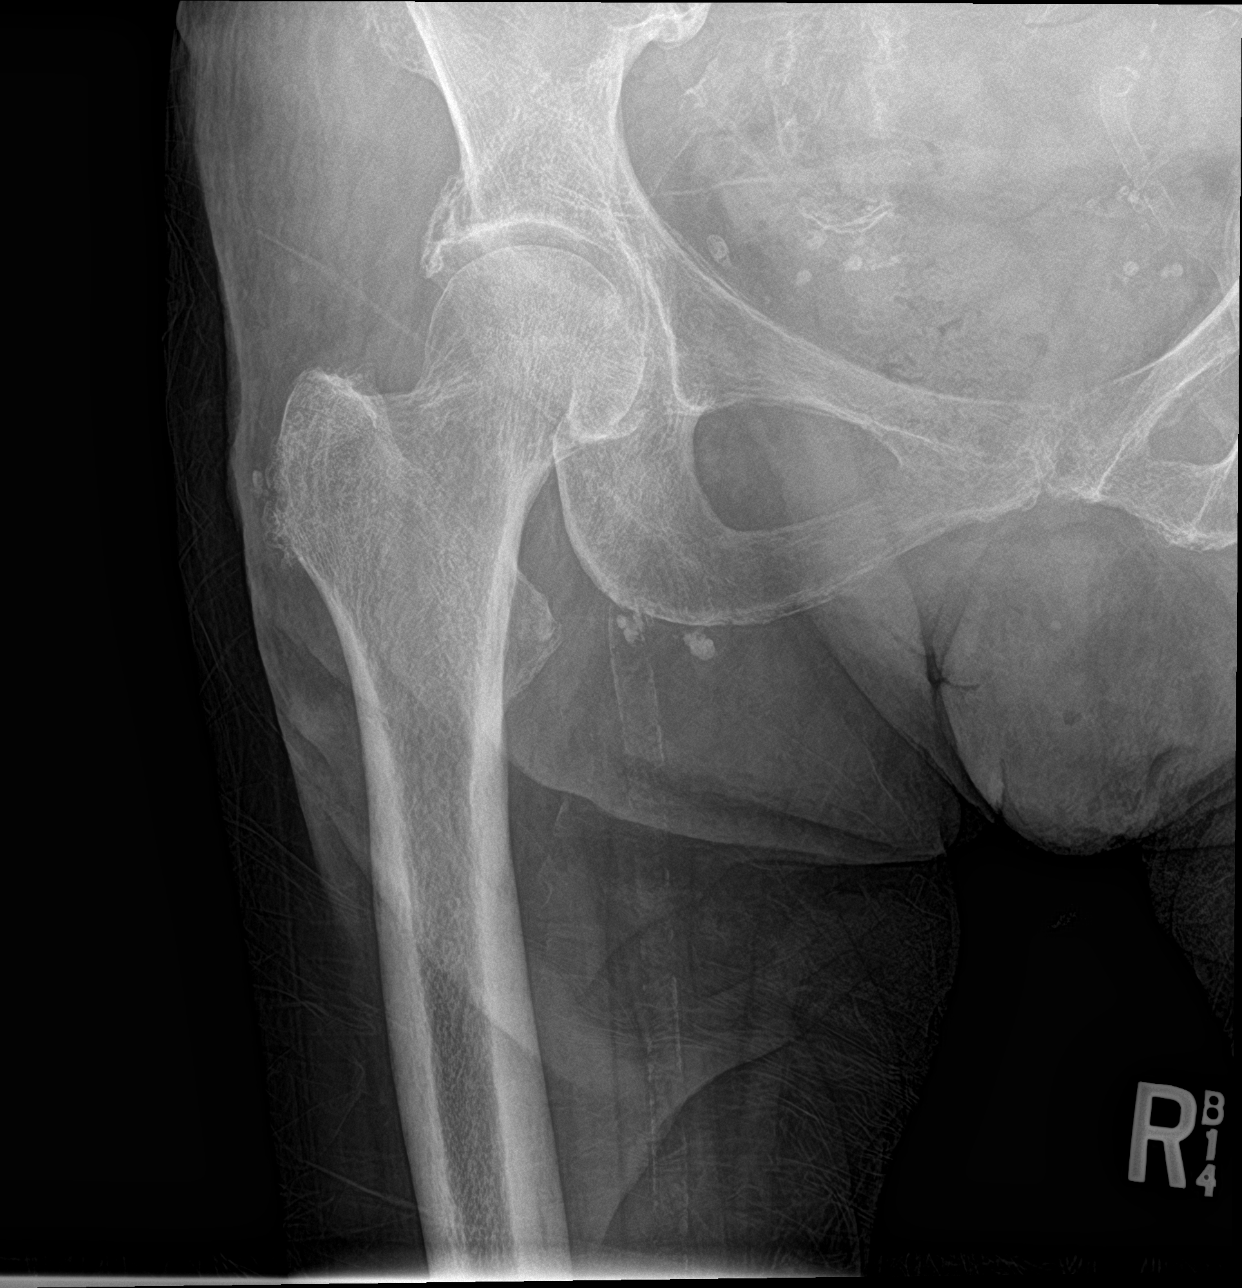

[hip lat]
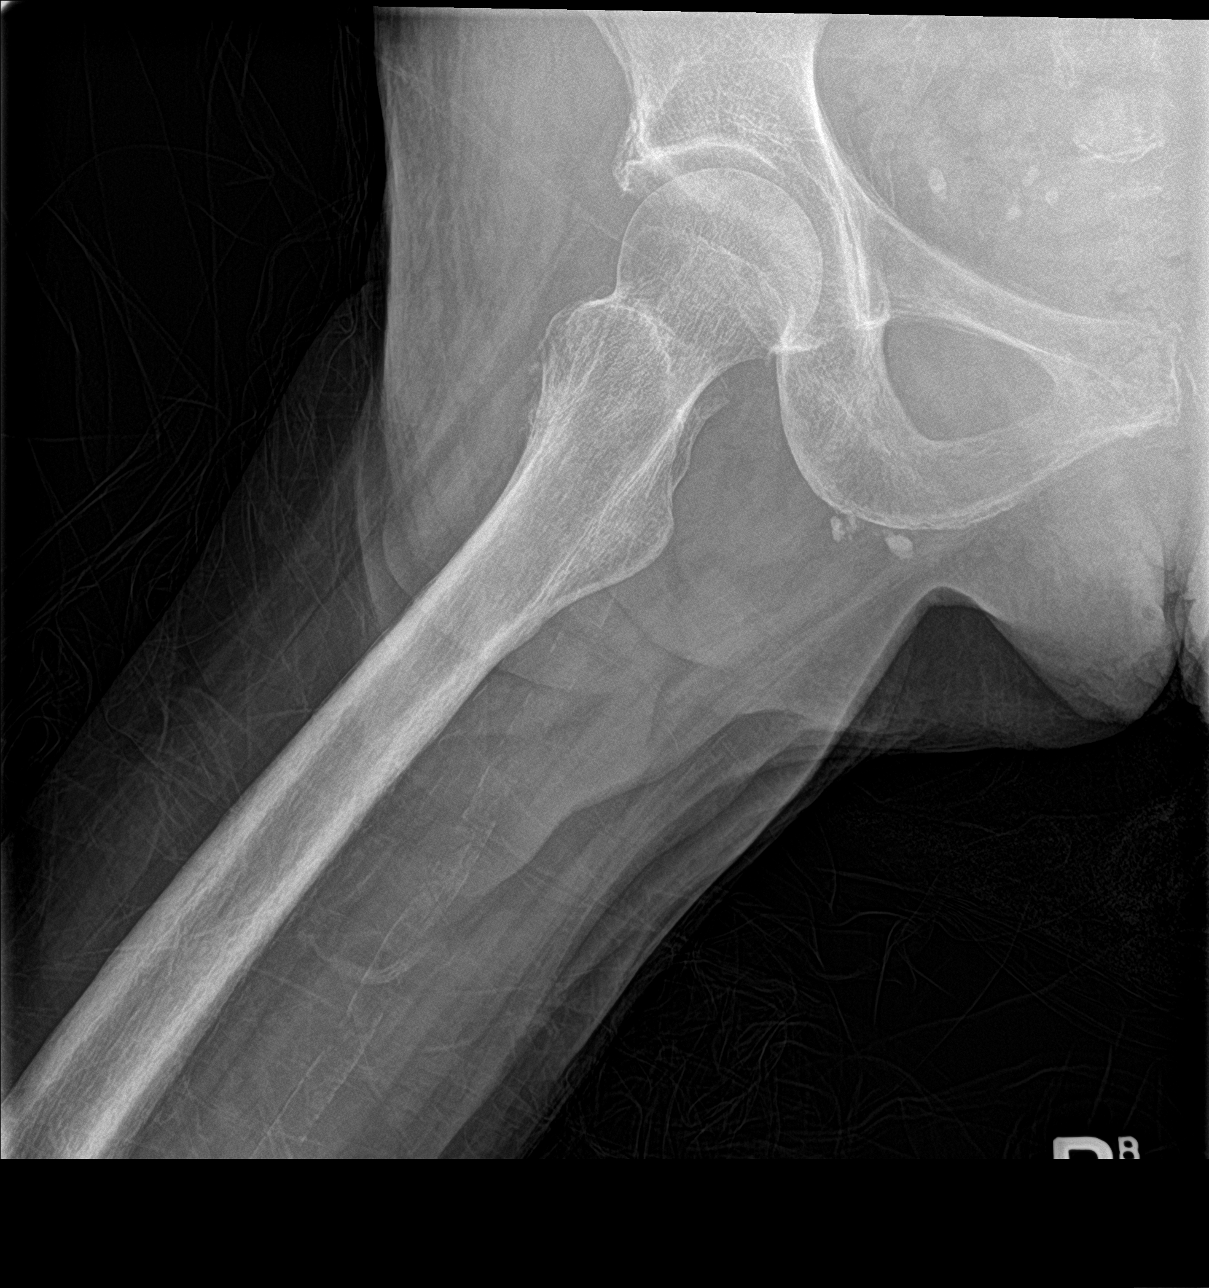

[pelvis ap]
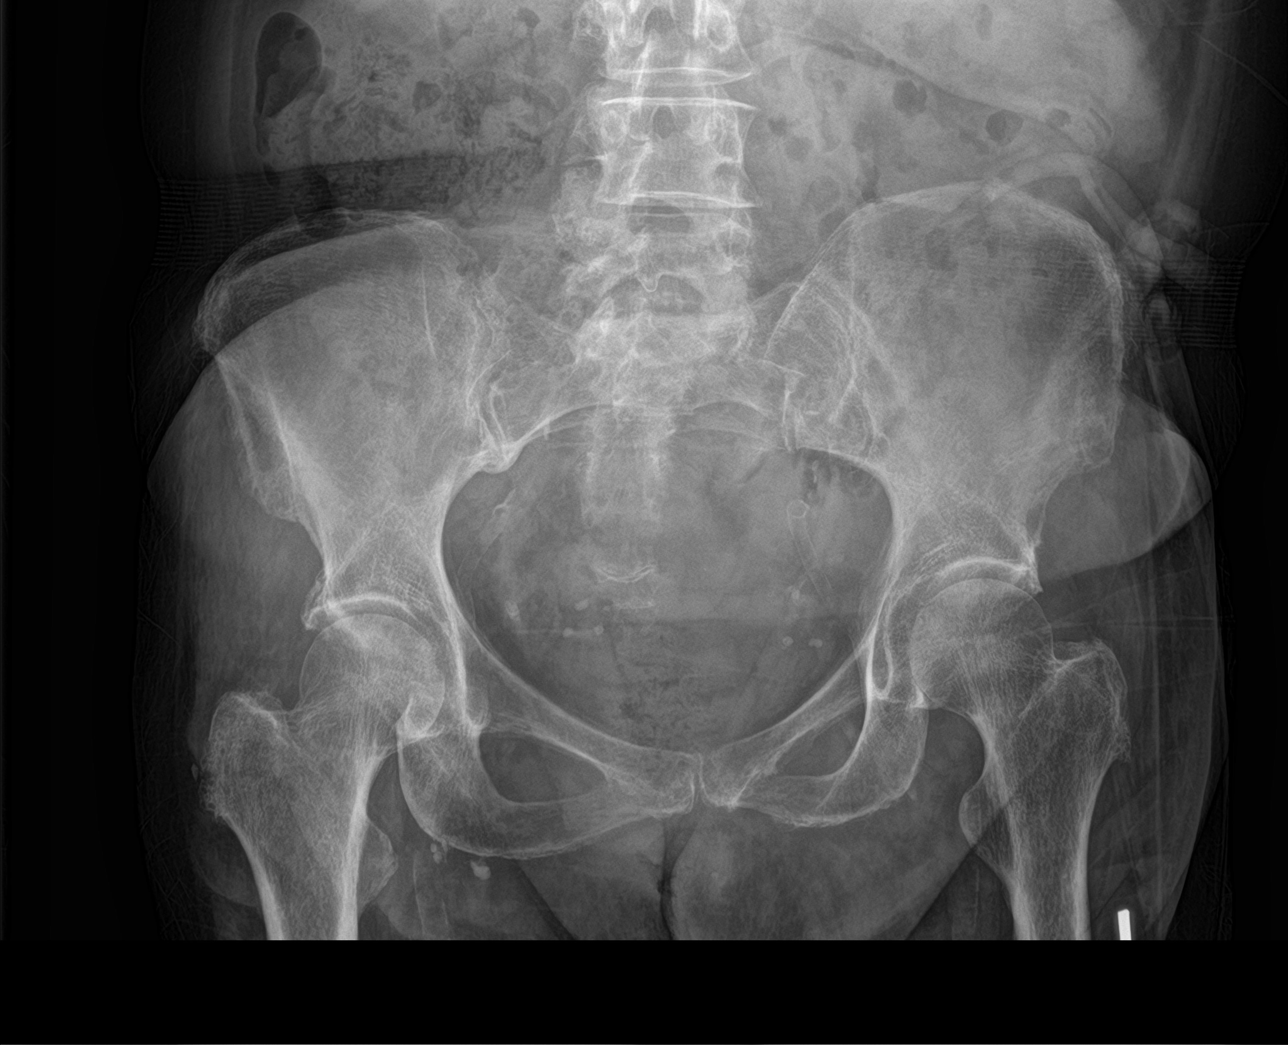

[3 of 3 positions shown; findings below may reference images not displayed]

FINDINGS: Negative for fracture or mass. Small calcification lateral to the
greater troch greater trochanter compatible with calcific
tendinitis. Arterial calcification.

.
IMPRESSION: Small area of calcific tendinitis lateral to the greater trochanter.
No acute skeletal abnormality.

## 2019-11-12 ENCOUNTER — Ambulatory Visit (INDEPENDENT_AMBULATORY_CARE_PROVIDER_SITE_OTHER): Payer: Medicare Other | Admitting: Internal Medicine

## 2019-11-12 ENCOUNTER — Other Ambulatory Visit: Payer: Self-pay

## 2019-11-12 ENCOUNTER — Ambulatory Visit
Admission: RE | Admit: 2019-11-12 | Discharge: 2019-11-12 | Disposition: A | Discharge status: Home / Self Care | Payer: Medicare Other | Source: Ambulatory Visit | Attending: Internal Medicine | Admitting: Internal Medicine

## 2019-11-12 ENCOUNTER — Encounter: Payer: Self-pay | Admitting: Internal Medicine

## 2019-11-12 VITALS — BP 99/63 | HR 79 | Temp 98.8°F | Wt 103.0 lb

## 2019-11-12 DIAGNOSIS — R634 Abnormal weight loss: Secondary | ICD-10-CM

## 2019-11-12 DIAGNOSIS — J851 Abscess of lung with pneumonia: Secondary | ICD-10-CM | POA: Diagnosis not present

## 2019-11-12 NOTE — Progress Notes (Signed)
RFV: initial visit for hospital follow up on cavitary pneumonia Patient ID: Julia Shea, female   DOB: 06-10-1956, 64 y.o.   MRN: [id number]  HPI Julia Shea is a 64yo F with MMP, who was admitted recently found to have B/l cavitary and nodular lesions - very likely from aspiration as TEE does not show any vegetation. She has decaying, broken, few teeth left behind and that was thought to be the source for infection. herBlood cultures negative, TEE neg, Leucocytosis N, no fever. She did undergo lung biopsy which showed necrosis and inflammation- special stains neg, cultures neg so far- AFB neg, she was treated with 12 days of IV ceftriaxone plus flagyl then changed to 2 addn weeks of oral abtx with cefpodxoime and oral flagyl. She remains at [facility]. She is hoping to move to [facility] with her brother soon.  ROS: #20+ unintended weight loss since late December. Has been deconditioned. No respiratory complaints.   Outpatient Encounter Medications as of 11/12/2019  Medication Sig  . acidophilus (RISAQUAD) CAPS capsule Take 1 capsule by mouth daily.  . Ensure Max Protein (ENSURE MAX PROTEIN) LIQD Take 330 mLs (11 oz total) by mouth 2 (two) times daily between meals.  Marland Kitchen escitalopram (LEXAPRO) 5 MG tablet Take 5 mg by mouth daily.  . ferrous sulfate 325 (65 FE) MG tablet Take 1 tablet (325 mg total) by mouth daily with breakfast.  . folic acid (FOLVITE) 1 MG tablet Take 1 tablet (1 mg total) by mouth daily.  . Hydrocortisone (GERHARDT'S BUTT CREAM) CREA Apply 1 application topically 3 (three) times daily.  . insulin NPH-regular Human (70-30) 100 UNIT/ML injection Inject 10 Units into the skin 2 (two) times daily with a meal.  . liver oil-zinc oxide (DESITIN) 40 % ointment Apply topically 3 (three) times daily.  Marland Kitchen loperamide (IMODIUM) 2 MG capsule Take 1 capsule (2 mg total) by mouth every 8 (eight) hours as needed for diarrhea or loose stools.  . magnesium oxide (MAG-OX) 400 (241.3 Mg) MG tablet Take 1 tablet  (400 mg total) by mouth daily.  . midodrine (PROAMATINE) 5 MG tablet Take 1 tablet (5 mg total) by mouth 3 (three) times daily with meals.  . Multiple Vitamins-Minerals (MULTIVITAMIN WITH MINERALS) tablet Take 1 tablet by mouth daily.  Marland Kitchen nystatin cream (MYCOSTATIN) Apply 1 application topically 2 (two) times daily.  . pantoprazole (PROTONIX) 40 MG tablet Take 1 tablet (40 mg total) by mouth daily.  . potassium chloride (KLOR-CON) 10 MEQ tablet Take 1 tablet (10 mEq total) by mouth daily.  . traZODone (DESYREL) 100 MG tablet Take 1 tablet (100 mg total) by mouth at bedtime.   No facility-administered encounter medications on file as of 11/12/2019.     Patient Active Problem List   Diagnosis Date Noted  . Diarrhea   . Hip pain   . Anemia of chronic disease   . Left groin pain   . Cavitary lung disease   . Hypotension   . Hyponatremia 08/21/2019  . Acute lower UTI 08/21/2019  . AKI (acute kidney injury) ([place]) 08/21/2019  . Severe malnutrition ([place]) 03/31/2018  . Hypokalemia 03/29/2018  . Diabetes mellitus type 2, with complication, on long term insulin pump ([place])   . Nausea & vomiting   . Chronic indwelling Foley catheter   . Bladder problem   . Rotator cuff tear arthropathy, left   . Malnutrition of moderate degree 01/24/2018  . Pressure injury of skin 12/22/2017  . Hepatic steatosis 05/15/2013  . Abdominal pain,  right upper quadrant 05/07/2013  . Breast cancer screening 11/14/2011  . Hypertriglyceridemia 10/30/2011  . Depression 10/26/2011  . Diabetes mellitus type 2, uncontrolled ([place]) 10/26/2011  . Hypertension 10/26/2011     Health Maintenance Due  Topic Date Due  . Hepatitis C Screening  1955/12/15  . TETANUS/TDAP  10/02/2007  . MAMMOGRAM  10/02/2011  . FOOT EXAM  05/07/2014  . URINE MICROALBUMIN  05/07/2014  . PAP SMEAR-Modifier  11/14/2015  . DEXA SCAN  08/25/2019  . PNA vac Low Risk Adult (1 of 2 - PCV13) 08/25/2019  . OPHTHALMOLOGY EXAM  10/31/2019    Social  History   Tobacco Use  . Smoking status: Never Smoker  . Smokeless tobacco: Never Used  Substance Use Topics  . Alcohol use: No  . Drug use: No  family history includes Diabetes in her maternal uncle. Physical Exam   BP 99/63   Pulse 79   Temp 98.8 F (37.1 C) (Oral)   Wt 103 lb (46.7 kg) Comment: February's weight per SNF staff  SpO2 98%   BMI 18.84 kg/m   Physical Exam  Constitutional:  oriented to person, place, and time. appears chronically ill and under-nourished. No distress.  HENT: [employer]/AT, PERRLA, no scleral icterus Mouth/Throat: Oropharynx is clear and moist. No oropharyngeal exudate.  Cardiovascular: Normal rate, regular rhythm and normal heart sounds. Exam reveals no gallop and no friction rub.  No murmur heard.  Pulmonary/Chest: Effort normal and breath sounds normal. No respiratory distress.  has no wheezes.  Neck = supple, no nuchal rigidity Lymphadenopathy: no cervical adenopathy. No axillary adenopathy Ext: muscle wasting Skin: Skin is warm and dry. No rash noted. No erythema.   CBC Lab Results  Component Value Date   WBC 6.2 09/08/2019   RBC 2.88 (L) 09/08/2019   HGB 7.3 (L) 09/08/2019   HCT 22.9 (L) 09/08/2019   PLT 273 09/08/2019   MCV 79.5 (L) 09/08/2019   MCH 25.3 (L) 09/08/2019   MCHC 31.9 09/08/2019   RDW 14.6 09/08/2019   LYMPHSABS 1.3 09/08/2019   MONOABS 0.8 09/08/2019   EOSABS 0.1 09/08/2019    BMET Lab Results  Component Value Date   NA 135 09/08/2019   K 3.3 (L) 09/08/2019   CL 105 09/08/2019   CO2 24 09/08/2019   GLUCOSE 305 (H) 09/08/2019   BUN 19 09/08/2019   CREATININE 0.86 09/08/2019   CALCIUM 7.9 (L) 09/08/2019   GFRNONAA >60 09/08/2019   GFRAA >60 09/08/2019      Assessment and Plan History of bilateral pneumonia with cavitary lesions = thought to be due to bacterial causes. Will get cxr to see that there is improvement. If still abn, would Recommend to get chest/abd/pelvis CT  Unintended weight loss = Still concern  for malignancy. Recommend to get C/AP CT.

## 2019-12-08 ENCOUNTER — Other Ambulatory Visit: Payer: Self-pay

## 2019-12-08 ENCOUNTER — Ambulatory Visit (INDEPENDENT_AMBULATORY_CARE_PROVIDER_SITE_OTHER): Payer: Medicare Other | Admitting: Internal Medicine

## 2019-12-08 ENCOUNTER — Encounter: Payer: Self-pay | Admitting: Internal Medicine

## 2019-12-08 VITALS — BP 120/62 | HR 83 | Temp 97.5°F | Ht 62.0 in | Wt 108.0 lb

## 2019-12-08 DIAGNOSIS — R9389 Abnormal findings on diagnostic imaging of other specified body structures: Secondary | ICD-10-CM | POA: Diagnosis not present

## 2019-12-08 NOTE — Progress Notes (Signed)
12/08/19   Assessment & Plan:  Problem 1 abnormal imaging:  - Does look like an aspiration event vs. Septic emboli - CXR markedly improved with only mild residual scarring after antibiotics - Denies aspiration symptoms but I note dysphagia diet due to her dentition.  Plan: -Do not see need for further chest imaging unless she develops symptoms -There is some report of weight loss but looks like this is improved today  -Patient has no breathing complaints, she can f/u with [place] on a PRN basis    End of visit medications:  Current Outpatient Medications:  .  acidophilus (RISAQUAD) CAPS capsule, Take 1 capsule by mouth daily., Disp: 30 capsule, Rfl: 0 .  Ensure Max Protein (ENSURE MAX PROTEIN) LIQD, Take 330 mLs (11 oz total) by mouth 2 (two) times daily between meals., Disp: , Rfl:  .  escitalopram (LEXAPRO) 5 MG tablet, Take 5 mg by mouth daily., Disp: , Rfl:  .  ferrous sulfate 325 (65 FE) MG tablet, Take 1 tablet (325 mg total) by mouth daily with breakfast., Disp: 30 tablet, Rfl: 0 .  folic acid (FOLVITE) 1 MG tablet, Take 1 tablet (1 mg total) by mouth daily., Disp: 30 tablet, Rfl: 0 .  Hydrocortisone (GERHARDT'S BUTT CREAM) CREA, Apply 1 application topically 3 (three) times daily., Disp: , Rfl:  .  insulin NPH-regular Human (70-30) 100 UNIT/ML injection, Inject 10 Units into the skin 2 (two) times daily with a meal., Disp: 10 mL, Rfl: 6 .  liver oil-zinc oxide (DESITIN) 40 % ointment, Apply topically 3 (three) times daily., Disp: 56.7 g, Rfl: 0 .  loperamide (IMODIUM) 2 MG capsule, Take 1 capsule (2 mg total) by mouth every 8 (eight) hours as needed for diarrhea or loose stools., Disp: 30 capsule, Rfl: 0 .  magnesium oxide (MAG-OX) 400 (241.3 Mg) MG tablet, Take 1 tablet (400 mg total) by mouth daily., Disp: 30 tablet, Rfl: 0 .  midodrine (PROAMATINE) 5 MG tablet, Take 1 tablet (5 mg total) by mouth 3 (three) times daily with meals., Disp: 90 tablet, Rfl: 0 .  Multiple  Vitamins-Minerals (MULTIVITAMIN WITH MINERALS) tablet, Take 1 tablet by mouth daily., Disp: 30 tablet, Rfl: 3 .  nystatin cream (MYCOSTATIN), Apply 1 application topically 2 (two) times daily., Disp: , Rfl:  .  pantoprazole (PROTONIX) 40 MG tablet, Take 1 tablet (40 mg total) by mouth daily., Disp: 90 tablet, Rfl: 3 .  potassium chloride (KLOR-CON) 10 MEQ tablet, Take 1 tablet (10 mEq total) by mouth daily., Disp: 90 tablet, Rfl: 3 .  traZODone (DESYREL) 100 MG tablet, Take 1 tablet (100 mg total) by mouth at bedtime., Disp: 30 tablet, Rfl: 2   Julia Furbish, MD [facility] 12/08/2019 11:27 AM    Subjective:   PATIENT ID: Julia Shea GENDER: female DOB: 1956/08/25, MRN: [id number]  Chief Complaint  Patient presents with  . Pulmonary Consult    referred by Dr Julia Shea @ [facility] for cavitary lesion    HPI Here for abnormal imaging. Admission 11/20-12/9/20 after found down, inability to care for self.  Multiple cavitary lesions on CT.  These were biopsied and c/w organizing pneumonia suspicion for aspiration.   She was sent to SNF.  She is blind.  She is feeling much better since that hospitalization and seems to be thriving in SNF.  She states she is wheelchair bound due to left hip issues (cannot find anything abnormal on her hip images).  She has no cough,  no wheezing, no SOB, no chest pain. Here for [place] to comment on the abnormal imaging. Repeat recent CXR showing near complete resolution.  Ancillary information including prior medications, full medical/surgical/family/social histoies, and PFTs (when available) are listed below and have been reviewed.    ROS + symptoms in bold Fevers, chills, weight loss Nausea, vomiting, diarrhea Shortness of breath, wheezing, cough Chest pain, palpitations, lower ext edema   Objective:   Vitals:   12/08/19 1108  BP: 120/62  Pulse: 83  Temp: (!) 97.5 F (36.4 C)  TempSrc: Temporal  SpO2: 100%  Weight:  108 lb (49 kg)  Height: 5\' 2"  (1.575 m)   100% on RA BMI Readings from Last 3 Encounters:  12/08/19 19.75 kg/m  11/12/19 18.84 kg/m  09/07/19 21.25 kg/m   Wt Readings from Last 3 Encounters:  12/08/19 108 lb (49 kg)  11/12/19 103 lb (46.7 kg)  09/07/19 116 lb 2.9 oz (52.7 kg)    GEN: frail lady in no acute distress HEENT: trachea midline, mucus membranes moist, poor dentition CV: Regular rate and rhythm, pronounced P2, extremities are warm PULM: Clear without wheezing, no accessory muscle use GI: Soft, +BS EXT: Some muscle wasting NEURO: Moves all 4 extremities   Ancillary Information    Past Medical History:  Diagnosis Date  . Asthma   . Bilateral hip pain   . 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 Chronic indwelling Foley catheter   . Closed comminuted left humeral fracture    ORIF in June 2017  . Depression   . Diabetes mellitus type 2, with complication, on long term insulin pump ([place])   . Falls frequently   . Hypertension   . MRSA infection   . Nausea & vomiting   . Rotator cuff tear arthropathy, left      Family History  Problem Relation Age of Onset  . Diabetes Maternal Uncle      Past Surgical History:  Procedure Laterality Date  . LAPAROSCOPIC OOPHORECTOMY     removal of cyst  . TEE WITHOUT CARDIOVERSION N/A 09/07/2019   Procedure: TRANSESOPHAGEAL ECHOCARDIOGRAM (TEE);  Surgeon: Julia Spray, MD;  Location: [facility] ORS;  Service: Cardiovascular;  Laterality: N/A;    Social History   Socioeconomic History  . Marital status: Single    Spouse name: Not on file  . Number of children: Not on file  . Years of education: Not on file  . Highest education level: Not on file  Occupational History  . Occupation: retired  Tobacco Use  . Smoking status: Never Smoker  . Smokeless tobacco: Never Used  Substance and Sexual Activity  . Alcohol use: No  . Drug use: No  . Sexual  activity: Not Currently  Other Topics Concern  . Not on file  Social History Narrative  . Not on file   Social Determinants of Health   Financial Resource Strain:   . Difficulty of Paying Living Expenses: Not on file  Food Insecurity:   . Worried About Charity fundraiser in the Last Year: Not on file  . Ran Out of Food in the Last Year: Not on file  Transportation Needs:   . Lack of Transportation (Medical): Not on file  . Lack of Transportation (Non-Medical): Not on file  Physical Activity:   . Days of Exercise per Week: Not on file  . Minutes of Exercise per Session: Not on file  Stress:   .  Feeling of Stress : Not on file  Social Connections:   . Frequency of Communication with Friends and Family: Not on file  . Frequency of Social Gatherings with Friends and Family: Not on file  . Attends Religious Services: Not on file  . Active Member of Clubs or Organizations: Not on file  . Attends Archivist Meetings: Not on file  . Marital Status: Not on file  Intimate Partner Violence:   . Fear of Current or Ex-Partner: Not on file  . Emotionally Abused: Not on file  . Physically Abused: Not on file  . Sexually Abused: Not on file     Allergies  Allergen Reactions  . Penicillins     Has patient had a PCN reaction causing immediate rash, facial/tongue/throat swelling, SOB or lightheadedness with hypotension: Yes Has patient had a PCN reaction causing severe rash involving mucus membranes or skin necrosis: Yes Has patient had a PCN reaction that required hospitalization: Yes Has patient had a PCN reaction occurring within the last 10 years: Yes If all of the above answers are "NO", then may proceed with Cephalosporin use.  . Sulfa Antibiotics      CBC    Component Value Date/Time   WBC 6.2 09/08/2019 0324   RBC 2.88 (L) 09/08/2019 0324   HGB 7.3 (L) 09/08/2019 0324   HGB 10.7 (L) 08/13/2018 1211   HCT 22.9 (L) 09/08/2019 0324   HCT 32.2 (L) 08/13/2018 1211    PLT 273 09/08/2019 0324   PLT 365 08/13/2018 1211   MCV 79.5 (L) 09/08/2019 0324   MCV 86 08/13/2018 1211   MCH 25.3 (L) 09/08/2019 0324   MCHC 31.9 09/08/2019 0324   RDW 14.6 09/08/2019 0324   RDW 14.4 08/13/2018 1211   LYMPHSABS 1.3 09/08/2019 0324   LYMPHSABS 4.4 (H) 02/06/2018 1205   MONOABS 0.8 09/08/2019 0324   EOSABS 0.1 09/08/2019 0324   EOSABS 0.2 02/06/2018 1205   BASOSABS 0.0 09/08/2019 0324   BASOSABS 0.0 02/06/2018 1205    Pulmonary Functions Testing Results: No flowsheet data found.  Outpatient Medications Prior to Visit  Medication Sig Dispense Refill  . acidophilus (RISAQUAD) CAPS capsule Take 1 capsule by mouth daily. 30 capsule 0  . Ensure Max Protein (ENSURE MAX PROTEIN) LIQD Take 330 mLs (11 oz total) by mouth 2 (two) times daily between meals.    Marland Kitchen escitalopram (LEXAPRO) 5 MG tablet Take 5 mg by mouth daily.    . ferrous sulfate 325 (65 FE) MG tablet Take 1 tablet (325 mg total) by mouth daily with breakfast. 30 tablet 0  . folic acid (FOLVITE) 1 MG tablet Take 1 tablet (1 mg total) by mouth daily. 30 tablet 0  . Hydrocortisone (GERHARDT'S BUTT CREAM) CREA Apply 1 application topically 3 (three) times daily.    . insulin NPH-regular Human (70-30) 100 UNIT/ML injection Inject 10 Units into the skin 2 (two) times daily with a meal. 10 mL 6  . liver oil-zinc oxide (DESITIN) 40 % ointment Apply topically 3 (three) times daily. 56.7 g 0  . loperamide (IMODIUM) 2 MG capsule Take 1 capsule (2 mg total) by mouth every 8 (eight) hours as needed for diarrhea or loose stools. 30 capsule 0  . magnesium oxide (MAG-OX) 400 (241.3 Mg) MG tablet Take 1 tablet (400 mg total) by mouth daily. 30 tablet 0  . midodrine (PROAMATINE) 5 MG tablet Take 1 tablet (5 mg total) by mouth 3 (three) times daily with meals. 90 tablet 0  .  Multiple Vitamins-Minerals (MULTIVITAMIN WITH MINERALS) tablet Take 1 tablet by mouth daily. 30 tablet 3  . nystatin cream (MYCOSTATIN) Apply 1 application  topically 2 (two) times daily.    . pantoprazole (PROTONIX) 40 MG tablet Take 1 tablet (40 mg total) by mouth daily. 90 tablet 3  . potassium chloride (KLOR-CON) 10 MEQ tablet Take 1 tablet (10 mEq total) by mouth daily. 90 tablet 3  . traZODone (DESYREL) 100 MG tablet Take 1 tablet (100 mg total) by mouth at bedtime. 30 tablet 2   No facility-administered medications prior to visit.

## 2019-12-08 NOTE — Patient Instructions (Signed)
-   It was nice to meet you - Please call if your breathing ever gets worse

## 2019-12-27 IMAGING — DX DG CHEST 1V PORT
1 series · 1 of 1 positions shown · non-contrast
Comparison: None.

CLINICAL DATA: Hypotension and weakness

EXAM:
PORTABLE CHEST 1 VIEW

[chest ap]
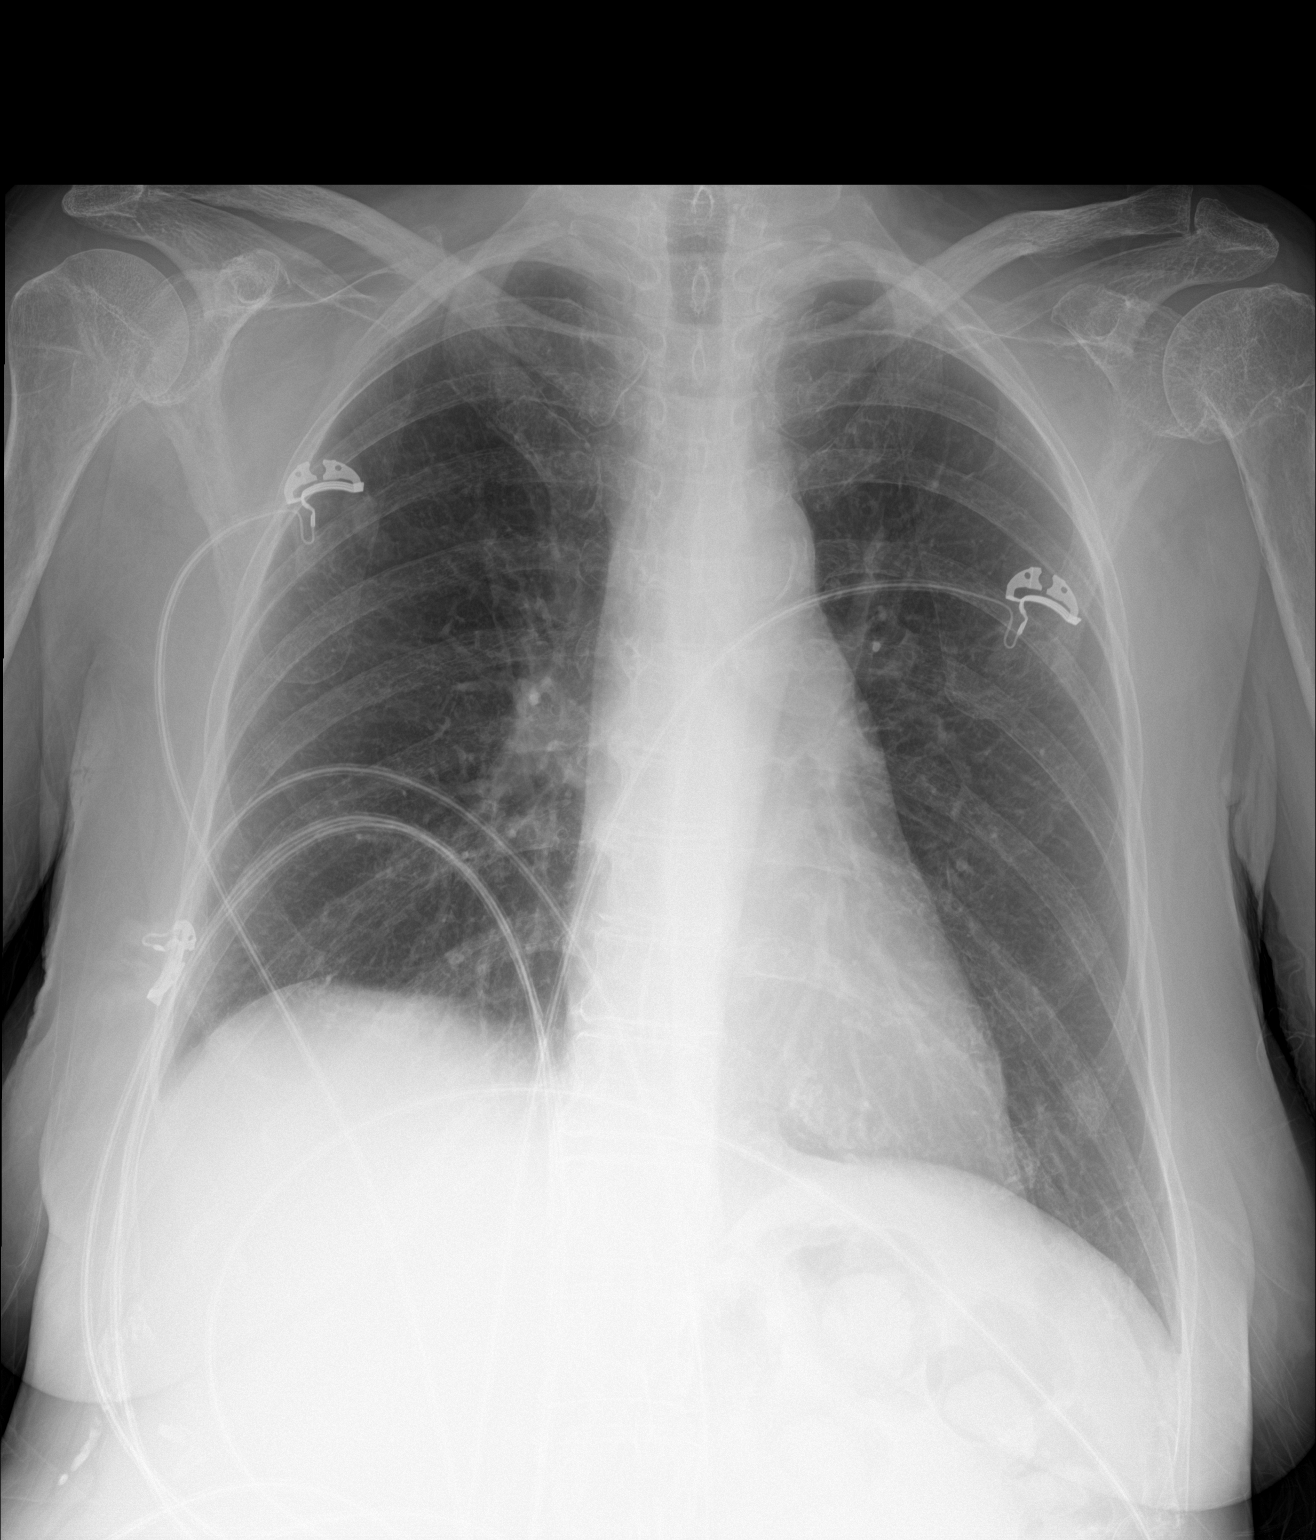

[1 of 1 positions shown; findings below may reference images not displayed]

FINDINGS: The heart size and mediastinal contours are within normal limits.
Both lungs are clear. The visualized skeletal structures are
unremarkable.
IMPRESSION: No active disease.

## 2020-02-11 ENCOUNTER — Other Ambulatory Visit: Payer: Self-pay | Admitting: Internal Medicine

## 2020-02-11 DIAGNOSIS — R1032 Left lower quadrant pain: Secondary | ICD-10-CM

## 2020-02-16 ENCOUNTER — Other Ambulatory Visit: Payer: Self-pay | Admitting: Obstetrics & Gynecology

## 2020-02-16 ENCOUNTER — Telehealth: Payer: Self-pay | Admitting: Obstetrics & Gynecology

## 2020-02-16 NOTE — Telephone Encounter (Signed)
[facility] referring for Gyn consult for evalution of left side pelvic pain and consideration for pelvic and transvaginal US to evalute left pelvic pain discomfort/ pain. hx of ovarian cyst and fibroids. Called and left voicemail for facility coordinator Diane to give [place] a call back to get patient scheduled. Patient has ultrasound scheduled 02/24/20.

## 2020-02-24 ENCOUNTER — Other Ambulatory Visit: Payer: Medicare Other

## 2020-02-26 ENCOUNTER — Ambulatory Visit (INDEPENDENT_AMBULATORY_CARE_PROVIDER_SITE_OTHER): Payer: Medicare Other | Admitting: Obstetrics and Gynecology

## 2020-02-26 ENCOUNTER — Other Ambulatory Visit: Payer: Self-pay

## 2020-02-26 ENCOUNTER — Encounter: Payer: Self-pay | Admitting: Obstetrics and Gynecology

## 2020-02-26 VITALS — BP 104/62 | HR 74 | Wt 115.0 lb

## 2020-02-26 DIAGNOSIS — R102 Pelvic and perineal pain: Secondary | ICD-10-CM | POA: Diagnosis not present

## 2020-02-26 NOTE — Progress Notes (Signed)
Gynecology Pelvic Pain Evaluation   Chief Complaint:  Chief Complaint  Patient presents with  . Pelvic Pain    Left ovarian cyst    History of Present Illness:   Patient is a 64 y.o. G0P0000 who LMP was No LMP recorded. Patient is postmenopausal., presents today for a problem visit.  She complains of left sided pelvic pain/hip pain with work up of hip normal to date.  She is wheelchair bound secondary to her hip, and has had a recent bout of pulmonary abscess attributed to aspiration event as TTE was normal with no evidence of vegitations.  She has been improving from a pulmonary standpoint but has had unintentional weight loss.    Her pain is localized to the LLQ area, described as constant, began several months ago and its severity is described as moderate. The pain radiates to the  Non-radiating. SPatient has these modifiers which include relaxation and heating pad that make it better and movement that make it worse.  Pain 9/10 per patient.  Previous evaluation: CT abdomen and pelvis 03/01/2018 normal gyn structures, bladder wall thickening and evidence of proctitis Prior Diagnosis: none. Previous Treatment: none.  Review of Systems: ROS  Past Medical History:  Patient Active Problem List   Diagnosis Date Noted  . Diarrhea   . Hip pain   . Anemia of chronic disease   . Left groin pain   . Cavitary lung disease   . Hypotension   . Hyponatremia 08/21/2019  . Acute lower UTI 08/21/2019  . AKI (acute kidney injury) ([place]) 08/21/2019  . Severe malnutrition ([place]) 03/31/2018  . Hypokalemia 03/29/2018  . Diabetes mellitus type 2, with complication, on long term insulin pump ([place])   . Nausea & vomiting   . Chronic indwelling Foley catheter   . Bladder problem     chronic in-dwelling Foley after developing "a hole in my bladder", reportedly in 2018   . Rotator cuff tear arthropathy, left   . Malnutrition of moderate degree 01/24/2018  . Pressure injury of skin 12/22/2017  .  Hepatic steatosis 05/15/2013  . Abdominal pain, right upper quadrant 05/07/2013  . Breast cancer screening 11/14/2011  . Hypertriglyceridemia 10/30/2011  . Depression 10/26/2011  . Diabetes mellitus type 2, uncontrolled ([facility]) 10/26/2011  . Hypertension 10/26/2011    Past Surgical History:  Past Surgical History:  Procedure Laterality Date  . LAPAROSCOPIC OOPHORECTOMY     removal of cyst  . TEE WITHOUT CARDIOVERSION N/A 09/07/2019   Procedure: TRANSESOPHAGEAL ECHOCARDIOGRAM (TEE);  Surgeon: Teodoro Spray, MD;  Location: [facility] ORS;  Service: Cardiovascular;  Laterality: N/A;    Gynecologic History:  No LMP recorded. Patient is postmenopausal.  Last Pap: 08/22/2001 Results were: normal   Obstetric History: G0P0000  Family History:  Family History  Problem Relation Age of Onset  . Diabetes Maternal Uncle     Social History:  Social History   Socioeconomic History  . Marital status: Single    Spouse name: Not on file  . Number of children: Not on file  . Years of education: Not on file  . Highest education level: Not on file  Occupational History  . Occupation: retired  Tobacco Use  . Smoking status: Never Smoker  . Smokeless tobacco: Never Used  Substance and Sexual Activity  . Alcohol use: No  . Drug use: No  . Sexual activity: Not Currently  Other Topics Concern  . Not on file  Social History Narrative  . Not on file  Social Determinants of Health   Financial Resource Strain:   . Difficulty of Paying Living Expenses:   Food Insecurity:   . Worried About Charity fundraiser in the Last Year:   . Arboriculturist in the Last Year:   Transportation Needs:   . Film/video editor (Medical):   Marland Kitchen Lack of Transportation (Non-Medical):   Physical Activity:   . Days of Exercise per Week:   . Minutes of Exercise per Session:   Stress:   . Feeling of Stress :   Social Connections:   . Frequency of Communication with Friends and Family:   . Frequency of  Social Gatherings with Friends and Family:   . Attends Religious Services:   . Active Member of Clubs or Organizations:   . Attends Archivist Meetings:   Marland Kitchen Marital Status:   Intimate Partner Violence:   . Fear of Current or Ex-Partner:   . Emotionally Abused:   Marland Kitchen Physically Abused:   . Sexually Abused:     Allergies:  Allergies  Allergen Reactions  . Penicillins     Has patient had a PCN reaction causing immediate rash, facial/tongue/throat swelling, SOB or lightheadedness with hypotension: Yes Has patient had a PCN reaction causing severe rash involving mucus membranes or skin necrosis: Yes Has patient had a PCN reaction that required hospitalization: Yes Has patient had a PCN reaction occurring within the last 10 years: Yes If all of the above answers are "NO", then may proceed with Cephalosporin use.  . Sulfa Antibiotics     Medications: Prior to Admission medications   Medication Sig Start Date End Date Taking? Authorizing Provider  acidophilus (RISAQUAD) CAPS capsule Take 1 capsule by mouth daily. 09/09/19   Ezekiel Slocumb, DO  Ensure Max Protein (ENSURE MAX PROTEIN) LIQD Take 330 mLs (11 oz total) by mouth 2 (two) times daily between meals. 09/08/19   Nicole Kindred A, DO  escitalopram (LEXAPRO) 5 MG tablet Take 5 mg by mouth daily.    [provider]  ferrous sulfate 325 (65 FE) MG tablet Take 1 tablet (325 mg total) by mouth daily with breakfast. 09/09/19   Ezekiel Slocumb, DO  folic acid (FOLVITE) 1 MG tablet Take 1 tablet (1 mg total) by mouth daily. 09/09/19   Ezekiel Slocumb, DO  Hydrocortisone (GERHARDT'S BUTT CREAM) CREA Apply 1 application topically 3 (three) times daily. 09/08/19   Ezekiel Slocumb, DO  insulin NPH-regular Human (70-30) 100 UNIT/ML injection Inject 10 Units into the skin 2 (two) times daily with a meal. 09/08/19   Nicole Kindred A, DO  liver oil-zinc oxide (DESITIN) 40 % ointment Apply topically 3 (three) times daily. 09/08/19    Ezekiel Slocumb, DO  loperamide (IMODIUM) 2 MG capsule Take 1 capsule (2 mg total) by mouth every 8 (eight) hours as needed for diarrhea or loose stools. 09/08/19   Nicole Kindred A, DO  magnesium oxide (MAG-OX) 400 (241.3 Mg) MG tablet Take 1 tablet (400 mg total) by mouth daily. 09/09/19   Nicole Kindred A, DO  midodrine (PROAMATINE) 5 MG tablet Take 1 tablet (5 mg total) by mouth 3 (three) times daily with meals. 09/08/19   Ezekiel Slocumb, DO  Multiple Vitamins-Minerals (MULTIVITAMIN WITH MINERALS) tablet Take 1 tablet by mouth daily. 02/06/18   Tukov-Yual, Arlyss Gandy, NP  nystatin cream (MYCOSTATIN) Apply 1 application topically 2 (two) times daily.    [provider]  pantoprazole (PROTONIX) 40 MG tablet Take  1 tablet (40 mg total) by mouth daily. 09/08/19   Ezekiel Slocumb, DO  potassium chloride (KLOR-CON) 10 MEQ tablet Take 1 tablet (10 mEq total) by mouth daily. 09/08/19   Ezekiel Slocumb, DO  traZODone (DESYREL) 100 MG tablet Take 1 tablet (100 mg total) by mouth at bedtime. 09/08/19   Ezekiel Slocumb, DO    Physical Exam Vitals: Blood pressure 104/62, pulse 74, weight 115 lb (52.2 kg).  General: NAD HEENT: normocephalic, anicteric Pulmonary: No increased work of breathing Abdomen: NABS, soft, non-tender, non-distended.  Umbilicus without lesions.  No hepatomegaly, splenomegaly or masses palpable. No evidence of hernia  Extremities: no edema, erythema, or tenderness Neurologic: Grossly intact Psychiatric: mood appropriate, affect full   Assessment: 64 y.o. G0P0000 with LLQ pain  Problem List Items Addressed This Visit    None    Visit Diagnoses    Pelvic pain in female    -  Primary   Relevant Orders   US Transvaginal Non-OB       1) We discussed the possible etiologies for pelvic pain in women.  Gynecologic causes may include endometriosis, adenomyosis, pelvic inflammatory disease (PID), ovarian cysts, ovarian or tubal torsion, and in rare case  gynecologic malignancy such as cervical, uterine, or ovarian cancer.  In addition thee possibility of non-gynecologic etiologies such as urinary or GI tract pathology or disordered, as well as musculoskeletal problems.  The goal is to complete a basic work up in hopes of identifying the underlying cause which in turn will dictate treatment.  In the meantime supportive measures such as localized heat, and NSAIDs are reasonable first steps.     - Prescription drug database was not reviewed, UDS was not ordered - Transvaginal ultrasound ordered - Blood work obtained today No  - Cervical cultures No - U  9/10 pain, not associated with bowl movement, chronic catheter use.  NO PMB.  Normal imgaign 2019.  Will obtain pap at time of follow up procedure room given limited mobility  2) Return in about 1 week (around 03/04/2020) for TVUS and follow up pelvic pain (limited mobility may need to be done in procedure room).   Malachy Mood, MD, [facility], [facility] 02/26/2020, 11:11 AM

## 2020-03-18 ENCOUNTER — Other Ambulatory Visit (HOSPITAL_COMMUNITY)
Admission: RE | Admit: 2020-03-18 | Discharge: 2020-03-18 | Disposition: A | Discharge status: Home / Self Care | Payer: Medicare Other | Source: Ambulatory Visit | Attending: Obstetrics and Gynecology | Admitting: Obstetrics and Gynecology

## 2020-03-18 ENCOUNTER — Ambulatory Visit (INDEPENDENT_AMBULATORY_CARE_PROVIDER_SITE_OTHER): Payer: Medicare Other

## 2020-03-18 ENCOUNTER — Ambulatory Visit (INDEPENDENT_AMBULATORY_CARE_PROVIDER_SITE_OTHER): Payer: Medicare Other | Admitting: Obstetrics and Gynecology

## 2020-03-18 ENCOUNTER — Other Ambulatory Visit: Payer: Self-pay

## 2020-03-18 ENCOUNTER — Other Ambulatory Visit: Payer: Self-pay | Admitting: Obstetrics and Gynecology

## 2020-03-18 DIAGNOSIS — Z124 Encounter for screening for malignant neoplasm of cervix: Secondary | ICD-10-CM | POA: Diagnosis not present

## 2020-03-18 DIAGNOSIS — Z780 Asymptomatic menopausal state: Secondary | ICD-10-CM | POA: Insufficient documentation

## 2020-03-18 DIAGNOSIS — Z1151 Encounter for screening for human papillomavirus (HPV): Secondary | ICD-10-CM | POA: Insufficient documentation

## 2020-03-18 DIAGNOSIS — R102 Pelvic and perineal pain: Secondary | ICD-10-CM

## 2020-03-18 NOTE — Progress Notes (Signed)
Gynecology Ultrasound Follow Up  Chief Complaint: No chief complaint on file.    History of Present Illness: Patient is a 64 y.o. female who presents today for ultrasound evaluation of pelvic pain.  Ultrasound demonstrates the following findgins Adnexa: no adnexal masses Uterus: Non-enlarged with endometrial stripe  Imaging normally Additional: no free fluid  Review of Systems: Review of Systems  Constitutional: Negative.   Gastrointestinal: Positive for abdominal pain.    Past Medical History:  Past Medical History:  Diagnosis Date  . Asthma   . Bilateral hip pain   . 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 Chronic indwelling Foley catheter   . Closed comminuted left humeral fracture    ORIF in June 2017  . Depression   . Diabetes mellitus type 2, with complication, on long term insulin pump ([place])   . Falls frequently   . Hypertension   . MRSA infection   . Nausea & vomiting   . Rotator cuff tear arthropathy, left     Past Surgical History:  Past Surgical History:  Procedure Laterality Date  . LAPAROSCOPIC OOPHORECTOMY     removal of cyst  . TEE WITHOUT CARDIOVERSION N/A 09/07/2019   Procedure: TRANSESOPHAGEAL ECHOCARDIOGRAM (TEE);  Surgeon: Teodoro Spray, MD;  Location: [facility] ORS;  Service: Cardiovascular;  Laterality: N/A;    Gynecologic History:  No LMP recorded. Patient is postmenopausal. Contraception: post menopausal status  Family History:  Family History  Problem Relation Age of Onset  . Diabetes Maternal Uncle     Social History:  Social History   Socioeconomic History  . Marital status: Single    Spouse name: Not on file  . Number of children: Not on file  . Years of education: Not on file  . Highest education level: Not on file  Occupational History  . Occupation: retired  Tobacco Use  . Smoking status: Never Smoker  . Smokeless tobacco: Never Used  Vaping Use  .  Vaping Use: Never used  Substance and Sexual Activity  . Alcohol use: No  . Drug use: No  . Sexual activity: Not Currently  Other Topics Concern  . Not on file  Social History Narrative  . Not on file   Social Determinants of Health   Financial Resource Strain:   . Difficulty of Paying Living Expenses:   Food Insecurity:   . Worried About Charity fundraiser in the Last Year:   . Arboriculturist in the Last Year:   Transportation Needs:   . Film/video editor (Medical):   Marland Kitchen Lack of Transportation (Non-Medical):   Physical Activity:   . Days of Exercise per Week:   . Minutes of Exercise per Session:   Stress:   . Feeling of Stress :   Social Connections:   . Frequency of Communication with Friends and Family:   . Frequency of Social Gatherings with Friends and Family:   . Attends Religious Services:   . Active Member of Clubs or Organizations:   . Attends Archivist Meetings:   Marland Kitchen Marital Status:   Intimate Partner Violence:   . Fear of Current or Ex-Partner:   . Emotionally Abused:   Marland Kitchen Physically Abused:   . Sexually Abused:     Allergies:  Allergies  Allergen Reactions  . Penicillins     Has patient had a PCN reaction causing immediate rash, facial/tongue/throat swelling, SOB or  lightheadedness with hypotension: Yes Has patient had a PCN reaction causing severe rash involving mucus membranes or skin necrosis: Yes Has patient had a PCN reaction that required hospitalization: Yes Has patient had a PCN reaction occurring within the last 10 years: Yes If all of the above answers are "NO", then may proceed with Cephalosporin use.  . Sulfa Antibiotics     Medications: Prior to Admission medications   Medication Sig Start Date End Date Taking? Authorizing Provider  acidophilus (RISAQUAD) CAPS capsule Take 1 capsule by mouth daily. 09/09/19   Ezekiel Slocumb, DO  Ensure Max Protein (ENSURE MAX PROTEIN) LIQD Take 330 mLs (11 oz total) by mouth 2 (two)  times daily between meals. 09/08/19   Nicole Kindred A, DO  escitalopram (LEXAPRO) 5 MG tablet Take 5 mg by mouth daily.    [provider]  ferrous sulfate 325 (65 FE) MG tablet Take 1 tablet (325 mg total) by mouth daily with breakfast. 09/09/19   Ezekiel Slocumb, DO  folic acid (FOLVITE) 1 MG tablet Take 1 tablet (1 mg total) by mouth daily. 09/09/19   Ezekiel Slocumb, DO  Hydrocortisone (GERHARDT'S BUTT CREAM) CREA Apply 1 application topically 3 (three) times daily. 09/08/19   Ezekiel Slocumb, DO  insulin NPH-regular Human (70-30) 100 UNIT/ML injection Inject 10 Units into the skin 2 (two) times daily with a meal. 09/08/19   Nicole Kindred A, DO  liver oil-zinc oxide (DESITIN) 40 % ointment Apply topically 3 (three) times daily. 09/08/19   Ezekiel Slocumb, DO  loperamide (IMODIUM) 2 MG capsule Take 1 capsule (2 mg total) by mouth every 8 (eight) hours as needed for diarrhea or loose stools. 09/08/19   Nicole Kindred A, DO  magnesium oxide (MAG-OX) 400 (241.3 Mg) MG tablet Take 1 tablet (400 mg total) by mouth daily. 09/09/19   Nicole Kindred A, DO  midodrine (PROAMATINE) 5 MG tablet Take 1 tablet (5 mg total) by mouth 3 (three) times daily with meals. 09/08/19   Ezekiel Slocumb, DO  Multiple Vitamins-Minerals (MULTIVITAMIN WITH MINERALS) tablet Take 1 tablet by mouth daily. 02/06/18   Tukov-Yual, Arlyss Gandy, NP  nystatin cream (MYCOSTATIN) Apply 1 application topically 2 (two) times daily.    [provider]  pantoprazole (PROTONIX) 40 MG tablet Take 1 tablet (40 mg total) by mouth daily. 09/08/19   Ezekiel Slocumb, DO  potassium chloride (KLOR-CON) 10 MEQ tablet Take 1 tablet (10 mEq total) by mouth daily. 09/08/19   Ezekiel Slocumb, DO  traZODone (DESYREL) 100 MG tablet Take 1 tablet (100 mg total) by mouth at bedtime. 09/08/19   Ezekiel Slocumb, DO    Physical Exam Vitals: There were no vitals taken for this visit.  General: NAD HEENT: normocephalic,  anicteric Pulmonary: No increased work of breathing Genitourinary:  External: Normal external female genitalia.  Normal urethral meatus, normal Bartholin's and Skene's glands.  Indwelling foley catheter present  Vagina: Normal vaginal mucosa, no evidence of prolapse.    Cervix: Grossly normal in appearance, no bleeding  Uterus: Non-enlarged, mobile, normal contour.  No CMT  Adnexa: ovaries non-enlarged, no adnexal masses  Rectal: deferred Extremities: no edema, erythema, or tenderness Neurologic: Grossly intact, normal gait Psychiatric: mood appropriate, affect full   Assessment: 64 y.o. G0P0000 follow up pelvic pain  Plan: Problem List Items Addressed This Visit    None    Visit Diagnoses    Pelvic pain    -  Primary   Screening for  malignant neoplasm of cervix       Relevant Orders   Cytology - PAP      1) Abdominal pelvic pain - no gyn etiology identified today on exam or imaging.  Pap smear obtained.  Reports has further urological work up.    2) A total of 15 minutes were spent in face-to-face contact with the patient during this encounter with over half of that time devoted to counseling and coordination of care.  3) Return in about 1 year (around 03/18/2021) for annual.   Malachy Mood, MD, [place], [facility] 03/18/2020, 11:39 AM

## 2020-03-21 LAB — CYTOLOGY - PAP
Comment: NEGATIVE
Diagnosis: NEGATIVE
High risk HPV: NEGATIVE

## 2020-03-25 ENCOUNTER — Encounter: Payer: Self-pay | Admitting: Gastroenterology

## 2020-03-25 ENCOUNTER — Ambulatory Visit (INDEPENDENT_AMBULATORY_CARE_PROVIDER_SITE_OTHER): Payer: Medicare Other | Admitting: Gastroenterology

## 2020-03-25 VITALS — BP 100/60 | HR 64 | Ht 62.0 in | Wt 116.0 lb

## 2020-03-25 DIAGNOSIS — R195 Other fecal abnormalities: Secondary | ICD-10-CM | POA: Diagnosis not present

## 2020-03-25 DIAGNOSIS — D649 Anemia, unspecified: Secondary | ICD-10-CM

## 2020-03-25 DIAGNOSIS — R1032 Left lower quadrant pain: Secondary | ICD-10-CM | POA: Diagnosis not present

## 2020-03-25 NOTE — Patient Instructions (Addendum)
It has been recommended to you by your physician that you have a(n) Endo/Colonoscopy at hospital completed.  We did not schedule the procedure(s) today. We will call [place] to schedule hospital procedure once we receive hospital schedule for August/Sept.   A non-urgent referral has been made to Hematology-  for unspecified Anemia.   Pt will need repeat lab ( CBC) in 4 weeks. See attached order for lab.   Continue oral iron as directed.   Please call office with any questions- ask for Junie Engram [phone].   Thank you for choosing me and [facility].  Dr. Rush Landmark

## 2020-03-27 ENCOUNTER — Encounter: Payer: Self-pay | Admitting: Gastroenterology

## 2020-03-27 DIAGNOSIS — D649 Anemia, unspecified: Secondary | ICD-10-CM | POA: Insufficient documentation

## 2020-03-27 DIAGNOSIS — R1032 Left lower quadrant pain: Secondary | ICD-10-CM | POA: Insufficient documentation

## 2020-03-27 DIAGNOSIS — R748 Abnormal levels of other serum enzymes: Secondary | ICD-10-CM | POA: Insufficient documentation

## 2020-03-27 DIAGNOSIS — R195 Other fecal abnormalities: Secondary | ICD-10-CM | POA: Insufficient documentation

## 2020-03-27 NOTE — Progress Notes (Signed)
[place] VISIT   Primary Care Provider Patient, No Pcp Per No address on file None  Referring Provider No referring provider defined for this encounter.   Patient Profile: Julia Shea is a 63 y.o. female with a pmh significant for legal blindness, diabetes, hypertension, MDD/anxiety, OA, GERD, anemia, Hemoccult positive stool.  The patient presents to the [facility] for an evaluation and management of problem(s) noted below:  Problem List 1. Occult blood in stools   2. Anemia, unspecified type   3. Abdominal discomfort in left lower quadrant     History of Present Illness This is the patient's first visit to the outpatient of our GI clinic.  She had outside stool testing that returned positive for occult blood in stool.  Laboratories back in April showed evidence of anemia of unclear etiology.  She did have some mild microcytosis.  We asked to repeat labs prior to her clinic visit.  Those were completed.  Her hemoglobin has mildly increased.  She no longer has a microcytosis.  Patient states she does not have any abdominal pain or issues.  She was started on iron twice daily in May and continues that at this time.  She has a history of GERD but it is controlled.  She has bowel movements on a regular basis.  She states she may have had pernicious anemia in the past.  She is never had an upper or lower endoscopy.  She has used laxatives in the past though it has been months between her last use of any as needed laxative.  Infrequently she will have left lower quadrant discomfort that eases up after just a few minutes and moving.  She has been gaining weight.  And she is eating better without any dysphagia for the last 6 months.  She is legally blind.  She denies any overt bleeding but cannot really tell as a result of her legal blindness.  No family history of colon cancer or GI malignancies.  GI Review of Systems Positive as above Negative  for odynophagia, decreased appetite, early satiety, change in bowel habits  Review of Systems General: Denies fevers/chills/weight loss unintentionally HEENT: Denies oral lesions Cardiovascular: Denies chest pain/palpitations Pulmonary: Denies shortness of breath Gastroenterological: See HPI Genitourinary: Denies darkened urine or hematuria though her legal blindness prevents her from being able to see this Hematological: Denies easy bruising/bleeding Endocrine: Denies temperature intolerance Dermatological: Denies jaundice though her legal blindness prevents her from being able to see this Psychological: Mood is stable   Medications Current Outpatient Medications  Medication Sig Dispense Refill  . acetaminophen (TYLENOL) 325 MG tablet Take 650 mg by mouth every 8 (eight) hours as needed.    Marland Kitchen acidophilus (RISAQUAD) CAPS capsule Take 1 capsule by mouth daily. 30 capsule 0  . ascorbic acid (VITAMIN C) 500 MG tablet Take 500 mg by mouth daily.    . ciprofloxacin (CIPRO) 500 MG tablet Take 500 mg by mouth 2 (two) times daily.    . Ensure Max Protein (ENSURE MAX PROTEIN) LIQD Take 330 mLs (11 oz total) by mouth 2 (two) times daily between meals.    Marland Kitchen escitalopram (LEXAPRO) 5 MG tablet Take 5 mg by mouth daily. 1/2 tablet daily    . ferrous sulfate 325 (65 FE) MG tablet Take 1 tablet (325 mg total) by mouth daily with breakfast. 30 tablet 0  . fluticasone (FLONASE) 50 MCG/ACT nasal spray Place 2 sprays into both nostrils daily.    . insulin aspart (  NOVOLOG) 100 UNIT/ML injection Inject into the skin 3 (three) times daily before meals. Per Sliding Scales    . Insulin Glargine (BASAGLAR KWIKPEN) 100 UNIT/ML Inject 15 Units into the skin daily.    Marland Kitchen liver oil-zinc oxide (DESITIN) 40 % ointment Apply topically 3 (three) times daily. 56.7 g 0  . loperamide (IMODIUM) 2 MG capsule Take 1 capsule (2 mg total) by mouth every 8 (eight) hours as needed for diarrhea or loose stools. 30 capsule 0  .  magnesium oxide (MAG-OX) 400 (241.3 Mg) MG tablet Take 1 tablet (400 mg total) by mouth daily. 30 tablet 0  . Multiple Vitamins-Minerals (MULTIVITAMIN WITH MINERALS) tablet Take 1 tablet by mouth daily. 30 tablet 3  . nystatin cream (MYCOSTATIN) Apply 1 application topically 2 (two) times daily.    . pantoprazole (PROTONIX) 40 MG tablet Take 1 tablet (40 mg total) by mouth daily. (Patient taking differently: Take 40 mg by mouth 2 (two) times daily. ) 90 tablet 3  . potassium chloride (KLOR-CON) 10 MEQ tablet Take 1 tablet (10 mEq total) by mouth daily. 90 tablet 3  . traZODone (DESYREL) 50 MG tablet Take 50 mg by mouth at bedtime.     No current facility-administered medications for this visit.    Allergies Allergies  Allergen Reactions  . Penicillins     Has patient had a PCN reaction causing immediate rash, facial/tongue/throat swelling, SOB or lightheadedness with hypotension: Yes Has patient had a PCN reaction causing severe rash involving mucus membranes or skin necrosis: Yes Has patient had a PCN reaction that required hospitalization: Yes Has patient had a PCN reaction occurring within the last 10 years: Yes If all of the above answers are "NO", then may proceed with Cephalosporin use.  . Sulfa Antibiotics     Histories Past Medical History:  Diagnosis Date  . Asthma   . Bilateral hip pain   . 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 Cataract    bilateral, clinical blind  . Chronic indwelling Foley catheter   . Closed comminuted left humeral fracture    ORIF in June 2017  . Depression   . Diabetes mellitus type 2, with complication, on long term insulin pump ([facility])   . Falls frequently   . Hypertension   . MRSA infection   . Nausea & vomiting   . Rotator cuff tear arthropathy, left    Past Surgical History:  Procedure Laterality Date  . LAPAROSCOPIC OOPHORECTOMY     removal of cyst  . TEE WITHOUT  CARDIOVERSION N/A 09/07/2019   Procedure: TRANSESOPHAGEAL ECHOCARDIOGRAM (TEE);  Surgeon: Teodoro Spray, MD;  Location: [facility] ORS;  Service: Cardiovascular;  Laterality: N/A;   Social History   Socioeconomic History  . Marital status: Single    Spouse name: Not on file  . Number of children: 0  . Years of education: Not on file  . Highest education level: Not on file  Occupational History  . Occupation: retired  Tobacco Use  . Smoking status: Never Smoker  . Smokeless tobacco: Never Used  Vaping Use  . Vaping Use: Never used  Substance and Sexual Activity  . Alcohol use: No  . Drug use: No  . Sexual activity: Not Currently  Other Topics Concern  . Not on file  Social History Narrative  . Not on file   Social Determinants of Health   Financial Resource Strain:   . Difficulty  of Paying Living Expenses:   Food Insecurity:   . Worried About Charity fundraiser in the Last Year:   . Arboriculturist in the Last Year:   Transportation Needs:   . Film/video editor (Medical):   Marland Kitchen Lack of Transportation (Non-Medical):   Physical Activity:   . Days of Exercise per Week:   . Minutes of Exercise per Session:   Stress:   . Feeling of Stress :   Social Connections:   . Frequency of Communication with Friends and Family:   . Frequency of Social Gatherings with Friends and Family:   . Attends Religious Services:   . Active Member of Clubs or Organizations:   . Attends Archivist Meetings:   Marland Kitchen Marital Status:   Intimate Partner Violence:   . Fear of Current or Ex-Partner:   . Emotionally Abused:   Marland Kitchen Physically Abused:   . Sexually Abused:    Family History  Problem Relation Age of Onset  . Heart attack Mother   . Heart attack Father   . Diabetes Maternal Uncle   . Colon cancer Neg Hx   . Esophageal cancer Neg Hx   . Inflammatory bowel disease Neg Hx   . Liver disease Neg Hx   . Pancreatic cancer Neg Hx   . Rectal cancer Neg Hx   . Stomach cancer Neg Hx      I have reviewed her medical, social, and family history in detail and updated the electronic medical record as necessary.    PHYSICAL EXAMINATION  BP 100/60   Pulse 64   Ht '5\' 2"'  (1.575 m)   Wt 116 lb (52.6 kg) Comment: per pt, unable to stand, in wheelchair  BMI 21.22 kg/m  Wt Readings from Last 3 Encounters:  03/25/20 116 lb (52.6 kg)  02/26/20 115 lb (52.2 kg)  12/08/19 108 lb (49 kg)  GEN: NAD, appears stated age, doesn't appear chronically ill PSYCH: Cooperative, without pressured speech EYE: Conjunctivae pink, sclerae anicteric ENT: MMM CV: Nontachycardic RESP: No audible wheezing GI: NABS, soft, NT/ND, without rebound or guarding MSK/EXT: Bilateral lower extremity edema is present SKIN: No jaundice NEURO:  Alert & Oriented x 3, no focal deficits   REVIEW OF DATA  I reviewed the following data at the time of this encounter:  GI Procedures and Studies  No relevant studies to review  Laboratory Studies  April 2021 outside labs Sodium 136 Potassium 4.5 Creatinine 1.21 Hemoglobin 8.3 Hematocrit 25.2 WBC 8.7 Platelet 316 MCV 79.3 AST/ALT 11/14 Alk phos 169 T bili 0.02 March 2020 labs Sodium 138 Potassium 4.5 Creatinine 1.04 AST/ALT 13/10 Alk phos 106 T bili 0.2 Albumin 3.6 Globulin 2.7 WBC 10.8 Hemoglobin 9.1 Hematocrit 27.7 Platelet 253 MCV 81.4 B12 345 Iron 62 TIBC 198 Iron saturation 31% Ferritin 578.4 Folic acid 69.6 Hemoglobin A1c 7.6  Imaging Studies  April 2021 abdominal ultrasound outside report Indication abdominal pain Ultrasound evidence of cholelithiasis without gallbladder wall thickening. Mild hydronephrosis without stone bilaterally. No abdominal aortic aneurysm. Unremarkable liver and spleen. Pancreas is not obscured by bowel gas.  For ovarian cyst concerns clinical correlation transvaginal ultrasound is recommended.  April 2021 [facility] outside report Indication noted pain/nausea/vomiting Normal gas  pattern.   ASSESSMENT  Ms. Julia Shea is a 64 y.o. female with a pmh significant for legal blindness, diabetes, hypertension, MDD/anxiety, OA, GERD, anemia, Hemoccult positive stool.  The patient is seen today for evaluation and management of:  1. Occult blood in stools  2. Anemia, unspecified type   3. Abdominal discomfort in left lower quadrant    The patient is hemodynamically stable.  Clinically as well she is doing well.  She is gaining weight.  Feels appetite is doing well.  She had Hemoccult positive stool.  She is not had any overt bleeding although she is legally blind.  Blood counts improved from initial consultation referral and they were last checked just a week ago.  No evidence of iron deficiency although she remains on iron currently that has been started back in May.  GERD symptoms well controlled.  With her history worthwhile to consider both an upper and lower endoscopy.  The risks and benefits of endoscopic evaluation were discussed with the patient; these include but are not limited to the risk of perforation, infection, bleeding, missed lesions, lack of diagnosis, severe illness requiring hospitalization, as well as anesthesia and sedation related illnesses.  The patient is agreeable to proceed.  If anemia persists then a hematology evaluation for underlying bone marrow issues will need to be considered.  All patient questions were answered, to the best of my ability, and the patient agrees to the aforementioned plan of action with follow-up as indicated.   PLAN  Repeat CBC in 6 weeks EGD/colonoscopy to be scheduled Likely referral to hematology if unremarkable endoscopic evaluation Continue PPI at current dose as she is well controlled on her GERD   Orders Placed This Encounter  Procedures  . CBC  . Ambulatory referral to Hematology / Oncology    New Prescriptions   No medications on file   Modified Medications   No medications on file    Planned Follow Up No  follow-ups on file.   Total Time in Face-to-Face and in Coordination of Care for patient including independent/personal interpretation/review of prior testing, medical history, examination, medication adjustment, communicating results with the patient directly, and documentation with the EHR is 50 minutes.   Justice Britain, MD [facility] Office # [phone]

## 2020-04-11 ENCOUNTER — Telehealth: Payer: Self-pay | Admitting: Gastroenterology

## 2020-04-11 NOTE — Telephone Encounter (Signed)
D[facility] is requesting a call back to schedule pt's CBC.

## 2020-04-11 NOTE — Telephone Encounter (Signed)
Julia Shea has been advised that no appt is needed the pt can come at her convenience.

## 2020-04-19 ENCOUNTER — Telehealth: Payer: Self-pay | Admitting: Gastroenterology

## 2020-04-19 NOTE — Telephone Encounter (Signed)
Lab order faxed to Diane at [phone]

## 2020-09-20 ENCOUNTER — Telehealth: Payer: Self-pay

## 2020-09-20 NOTE — Telephone Encounter (Signed)
Dr. Rush Landmark, pt is resides at [facility] and [place]. Several attempts have been made to contact nurse at [facility] and [place].I left several message, now when transferred unable to leave message because mailbox is full. We also sent referral for Hematology /Oncology and they never made contact with anyone to get pt scheduled after multiple attempts. I called and left voicemail on Brandy Rudin-NP cell in hopes that she will return call to office. I have also faxed letter to [facility] at [phone] asking them to contact office.

## 2020-09-26 NOTE — Telephone Encounter (Signed)
Thank you for update. Would put in our records to follow up in 2-3 months incase we do not hear from them sooner.   We can do so much and then we have to have the patient and facility do what they need to for the patient's care. If you have sent the letter and left voicemails that is what is expected. Thank you. GM

## 2020-10-27 ENCOUNTER — Encounter: Payer: Self-pay | Admitting: Gastroenterology

## 2020-10-27 NOTE — Progress Notes (Signed)
Review of outside records  Obtained from [facility] and rehabilitation  WBC 8.9 Hemoglobin 9.9 Hematocrit 29.3 Platelets 270 MCV 82.1 TSH 2.97 B12 464 Iron/TIBC 54/203 Saturation 90.2% Ferritin 111 Folic acid 8.5   My recommendations remain the same for consideration of upper and lower endoscopy although ability to get this scheduled due to where she lives has been difficult.  Looks like she has a follow-up with [place] in February and we hopefully will be able to arrange for endoscopic evaluation thereafter due to the prior history of heme positive stool and work-up of abdominal discomfort.  If issues persist with a negative endoscopic evaluation then consideration of hematology evaluation should be had.

## 2020-11-02 ENCOUNTER — Ambulatory Visit (INDEPENDENT_AMBULATORY_CARE_PROVIDER_SITE_OTHER): Payer: Medicare Other | Admitting: Nurse Practitioner

## 2020-11-02 ENCOUNTER — Encounter: Payer: Self-pay | Admitting: Nurse Practitioner

## 2020-11-02 VITALS — BP 80/60 | HR 88

## 2020-11-02 DIAGNOSIS — R195 Other fecal abnormalities: Secondary | ICD-10-CM

## 2020-11-02 DIAGNOSIS — D649 Anemia, unspecified: Secondary | ICD-10-CM

## 2020-11-02 MED ORDER — PLENVU 140 G PO SOLR: 1.0000 | ORAL | 0 refills | Status: DC

## 2020-11-02 NOTE — Patient Instructions (Signed)
PLEASE SEE ATTACHED INSTRUCTIONS FOR COLONOSCOPY.  We have sent the following medications to your pharmacy for you to pick up at your convenience: Plenuv   If you are age 67 or older, your body mass index should be between 23-30. Your There is no height or weight on file to calculate BMI. If this is out of the aforementioned range listed, please consider follow up with your Primary Care Provider.  If you are age 49 or younger, your body mass index should be between 19-25. Your There is no height or weight on file to calculate BMI. If this is out of the aformentioned range listed, please consider follow up with your Primary Care Provider.    Due to recent changes in healthcare laws, you may see the results of your imaging and laboratory studies on MyChart before your provider has had a chance to review them.  We understand that in some cases there may be results that are confusing or concerning to you. Not all laboratory results come back in the same time frame and the provider may be waiting for multiple results in order to interpret others.  Please give [place] 48 hours in order for your provider to thoroughly review all the results before contacting the office for clarification of your results.   You have been scheduled for an endoscopy and colonoscopy. Please follow the written instructions given to you at your visit today. Please pick up your prep supplies at the pharmacy within the next 1-3 days. If you use inhalers (even only as needed), please bring them with you on the day of your procedure.   Thank you for choosing me and [facility].  Tye Savoy NP

## 2020-11-02 NOTE — H&P (View-Only) (Signed)
   ASSESSMENT AND PLAN    # 65 yo female with chronic anemia, heme positive stools. Hgb is stable and no evidence for iron deficiency by labs but she is on iron replacements.  --Schedule for EGD and colonoscopy as previously planned at last visit in June 2021.  The risks and benefits of EGD and colonoscopy with possible polypectomy / biopsies were discussed and the patient agrees to proceed.   HISTORY OF PRESENT ILLNESS     Primary Gastroenterologist : Gabriel Mansouraty, MD  Chief Complaint :  Follow up on anemia  Julia Shea is a 65 y.o. female with PMH / PSH significant for,  but not necessarily limited to:  GERD, HTN, DM, CKD, chronic anemia, legally blind at one time ( resolved with cataract surgery)  Patient was evaluated here in June 2021 for Heme positive stools and anemia.  Plan was for EGD and colonoscopy which we tried to coordinate with [facility] where patient livers. Additionally we arranged for a Hematology referral but their office was unable to make contact with [facility] to make the appointment. We were able to get follow up labs on 10/18/20 and patient's  hgb was stable on iron at 9.9, .ferritin 180,  TIBC low,  transferrin normal.  B12 and folate normal.     Interval History:  Patient has returned to get scheduled for endoscopic evaluation. She doesn't know if her BMs ever contain blood.  She is still taking oral iron BID. She has.chronically loose stool, sometimes with associated lower cramping but otherwise feels okay. Appetite is okay, drinking a lot of water.    Past Medical History:  Diagnosis Date  . Asthma   . Bilateral hip pain   . 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 Cataract    bilateral, clinical blind  . Chronic indwelling Foley catheter   . Closed comminuted left humeral fracture    ORIF in June 2017  . Depression   . Diabetes mellitus type 2, with complication, on long  term insulin pump ([facility])   . Falls frequently   . Hypertension   . MRSA infection   . Nausea & vomiting   . Rotator cuff tear arthropathy, left     Current Medications, Allergies, Past Surgical History, Family History and Social History were reviewed in [place] electronic medical record.   Current Outpatient Medications  Medication Sig Dispense Refill  . acetaminophen (TYLENOL) 325 MG tablet Take 650 mg by mouth every 8 (eight) hours as needed.    . acidophilus (RISAQUAD) CAPS capsule Take 1 capsule by mouth daily. 30 capsule 0  . ascorbic acid (VITAMIN C) 500 MG tablet Take 500 mg by mouth 2 (two) times daily.    . Besifloxacin HCl 0.6 % SUSP Place 1 drop into both eyes 4 (four) times daily.    . escitalopram (LEXAPRO) 10 MG tablet Take 15 mg by mouth daily.    . ferrous sulfate 325 (65 FE) MG tablet Take 1 tablet (325 mg total) by mouth daily with breakfast. 30 tablet 0  . fluticasone (FLONASE) 50 MCG/ACT nasal spray Place 2 sprays into both nostrils daily.    . gabapentin (NEURONTIN) 100 MG capsule Take 100 mg by mouth 2 (two) times daily.    . insulin aspart (NOVOLOG) 100 UNIT/ML injection Inject into the skin 3 (three) times daily before meals. Per Sliding Scales    . Insulin Glargine (BASAGLAR   KWIKPEN) 100 UNIT/ML Inject 15 Units into the skin daily.    Marland Kitchen ketorolac (ACULAR) 0.5 % ophthalmic solution Place 1 drop into the left eye 2 (two) times daily.    Marland Kitchen liver oil-zinc oxide (DESITIN) 40 % ointment Apply topically 3 (three) times daily. 56.7 g 0  . loperamide (IMODIUM) 2 MG capsule Take 1 capsule (2 mg total) by mouth every 8 (eight) hours as needed for diarrhea or loose stools. 30 capsule 0  . magnesium oxide (MAG-OX) 400 (241.3 Mg) MG tablet Take 1 tablet (400 mg total) by mouth daily. 30 tablet 0  . mirabegron ER (MYRBETRIQ) 25 MG TB24 tablet Take 25 mg by mouth daily.    . Multiple Vitamins-Minerals (MULTIVITAMIN WITH MINERALS) tablet Take 1 tablet by mouth daily. 30  tablet 3  . nystatin cream (MYCOSTATIN) Apply 1 application topically 2 (two) times daily.    . pantoprazole (PROTONIX) 40 MG tablet Take 40 mg by mouth 2 (two) times daily.    . potassium chloride (KLOR-CON) 10 MEQ tablet Take 1 tablet (10 mEq total) by mouth daily. 90 tablet 3  . prednisoLONE acetate (PRED FORTE) 1 % ophthalmic suspension Place 1 drop into the left eye 2 (two) times daily.    . traZODone (DESYREL) 50 MG tablet Take 50 mg by mouth at bedtime.     No current facility-administered medications for this visit.    Review of Systems: No chest pain. No shortness of breath. No urinary complaints.   PHYSICAL EXAM :    Wt Readings from Last 3 Encounters:  03/25/20 116 lb (52.6 kg)  02/26/20 115 lb (52.2 kg)  12/08/19 108 lb (49 kg)    BP (!) 80/60 (BP Location: Left Arm, Patient Position: Sitting, Cuff Size: Normal)   Pulse 88  Constitutional:  Pleasant female in wheelchair in no acute distress. Psychiatric: Normal mood and affect. Behavior is normal. EENT: Pupils normal.  Conjunctivae are normal. No scleral icterus. Neck supple.  Cardiovascular: Normal rate, regular rhythm. No edema Pulmonary/chest: Effort normal and breath sounds normal. No wheezing, rales or rhonchi. Abdominal: Limited exam in wheelchair. Abdomen is soft, nondistended, nontender. Bowel sounds active throughout. Neurological: Alert and oriented to person place and time. Skin: Skin is warm and dry. No rashes noted.  Tye Savoy, NP  11/02/2020, 9:23 AM  Cc:  Referring Provider [facility]

## 2020-11-02 NOTE — Progress Notes (Signed)
ASSESSMENT AND PLAN    # 65 yo female with chronic anemia, heme positive stools. Hgb is stable and no evidence for iron deficiency by labs but she is on iron replacements.  --Schedule for EGD and colonoscopy as previously planned at last visit in June 2021.  The risks and benefits of EGD and colonoscopy with possible polypectomy / biopsies were discussed and the patient agrees to proceed.   HISTORY OF PRESENT ILLNESS     Primary Gastroenterologist : Justice Britain, MD  Chief Complaint :  Follow up on anemia  Julia Shea is a 65 y.o. 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  Plan was for EGD and colonoscopy which we tried to coordinate with [facility] where patient livers. Additionally we arranged for a Hematology referral but their office was unable to make contact with [facility] to make the appointment. We were able to get follow up labs on 10/18/20 and patient's  hgb was stable on iron at 9.9, .ferritin 180,  TIBC low,  transferrin normal.  B12 and folate normal.     Interval History:  Patient has returned to get scheduled for endoscopic evaluation. She doesn't know if her BMs ever contain blood.  She is still taking oral iron BID. She has.chronically loose stool, sometimes with associated lower cramping but otherwise feels okay. Appetite is okay, drinking a lot of water.    Past Medical History:  Diagnosis Date  . Asthma   . Bilateral hip pain   . 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 Cataract    bilateral, clinical blind  . Chronic indwelling Foley catheter   . Closed comminuted left humeral fracture    ORIF in June 2017  . Depression   . Diabetes mellitus type 2, with complication, on long  term insulin pump ([place])   . Falls frequently   . Hypertension   . MRSA infection   . Nausea & vomiting   . Rotator cuff tear arthropathy, left     Current Medications, Allergies, Past Surgical History, Family History and Social History were reviewed in [employer] record.   Current Outpatient Medications  Medication Sig Dispense Refill  . acetaminophen (TYLENOL) 325 MG tablet Take 650 mg by mouth every 8 (eight) hours as needed.    Marland Kitchen acidophilus (RISAQUAD) CAPS capsule Take 1 capsule by mouth daily. 30 capsule 0  . ascorbic acid (VITAMIN C) 500 MG tablet Take 500 mg by mouth 2 (two) times daily.    Marland Kitchen Besifloxacin HCl 0.6 % SUSP Place 1 drop into both eyes 4 (four) times daily.    Marland Kitchen escitalopram (LEXAPRO) 10 MG tablet Take 15 mg by mouth daily.    . ferrous sulfate 325 (65 FE) MG tablet Take 1 tablet (325 mg total) by mouth daily with breakfast. 30 tablet 0  . fluticasone (FLONASE) 50 MCG/ACT nasal spray Place 2 sprays into both nostrils daily.    Marland Kitchen gabapentin (NEURONTIN) 100 MG capsule Take 100 mg by mouth 2 (two) times daily.    . insulin aspart (NOVOLOG) 100 UNIT/ML injection Inject into the skin 3 (three) times daily before meals. Per Sliding Scales    . Insulin Glargine (BASAGLAR  KWIKPEN) 100 UNIT/ML Inject 15 Units into the skin daily.    Marland Kitchen ketorolac (ACULAR) 0.5 % ophthalmic solution Place 1 drop into the left eye 2 (two) times daily.    Marland Kitchen liver oil-zinc oxide (DESITIN) 40 % ointment Apply topically 3 (three) times daily. 56.7 g 0  . loperamide (IMODIUM) 2 MG capsule Take 1 capsule (2 mg total) by mouth every 8 (eight) hours as needed for diarrhea or loose stools. 30 capsule 0  . magnesium oxide (MAG-OX) 400 (241.3 Mg) MG tablet Take 1 tablet (400 mg total) by mouth daily. 30 tablet 0  . mirabegron ER (MYRBETRIQ) 25 MG TB24 tablet Take 25 mg by mouth daily.    . Multiple Vitamins-Minerals (MULTIVITAMIN WITH MINERALS) tablet Take 1 tablet by mouth daily. 30  tablet 3  . nystatin cream (MYCOSTATIN) Apply 1 application topically 2 (two) times daily.    . pantoprazole (PROTONIX) 40 MG tablet Take 40 mg by mouth 2 (two) times daily.    . potassium chloride (KLOR-CON) 10 MEQ tablet Take 1 tablet (10 mEq total) by mouth daily. 90 tablet 3  . prednisoLONE acetate (PRED FORTE) 1 % ophthalmic suspension Place 1 drop into the left eye 2 (two) times daily.    . traZODone (DESYREL) 50 MG tablet Take 50 mg by mouth at bedtime.     No current facility-administered medications for this visit.    Review of Systems: No chest pain. No shortness of breath. No urinary complaints.   PHYSICAL EXAM :    Wt Readings from Last 3 Encounters:  03/25/20 116 lb (52.6 kg)  02/26/20 115 lb (52.2 kg)  12/08/19 108 lb (49 kg)    BP (!) 80/60 (BP Location: Left Arm, Patient Position: Sitting, Cuff Size: Normal)   Pulse 88  Constitutional:  Pleasant female in wheelchair in no acute distress. Psychiatric: Normal mood and affect. Behavior is normal. EENT: Pupils normal.  Conjunctivae are normal. No scleral icterus. Neck supple.  Cardiovascular: Normal rate, regular rhythm. No edema Pulmonary/chest: Effort normal and breath sounds normal. No wheezing, rales or rhonchi. Abdominal: Limited exam in wheelchair. Abdomen is soft, nondistended, nontender. Bowel sounds active throughout. Neurological: Alert and oriented to person place and time. Skin: Skin is warm and dry. No rashes noted.  Tye Savoy, NP  11/02/2020, 9:23 AM  Cc:  Referring Provider [facility]

## 2020-11-05 NOTE — Progress Notes (Signed)
Attending Physician's Attestation   I have reviewed the chart.   I agree with the Advanced Practitioner's note, impression, and recommendations with any updates as below. Happy that she has come so that we can complete her workup previously advised.   Justice Britain, MD [facility] Advanced [facility]oscopy Office # [phone]

## 2020-11-21 ENCOUNTER — Other Ambulatory Visit (HOSPITAL_COMMUNITY)
Admission: RE | Admit: 2020-11-21 | Discharge: 2020-11-21 | Disposition: A | Discharge status: Home / Self Care | Payer: Medicare Other | Source: Ambulatory Visit | Attending: Gastroenterology | Admitting: Gastroenterology

## 2020-11-21 DIAGNOSIS — Z01812 Encounter for preprocedural laboratory examination: Secondary | ICD-10-CM | POA: Diagnosis present

## 2020-11-21 DIAGNOSIS — Z20822 Contact with and (suspected) exposure to covid-19: Secondary | ICD-10-CM | POA: Diagnosis not present

## 2020-11-22 LAB — SARS CORONAVIRUS 2 (TAT 6-24 HRS): SARS Coronavirus 2: NEGATIVE

## 2020-11-23 ENCOUNTER — Other Ambulatory Visit: Payer: Self-pay

## 2020-11-23 ENCOUNTER — Encounter (HOSPITAL_COMMUNITY): Payer: Self-pay | Admitting: Gastroenterology

## 2020-11-23 NOTE — Progress Notes (Signed)
Spoke with Patient's Nurse Clovis Pu, LPN at [facility] [phone] for PAT information.  PCP - Dr Geraldine Solar Cardiologist - n/a  Chest x-ray - 11/12/19 (2V) EKG - n/a Stress Test - n/a ECHO - 09/07/19 Cardiac Cath - n/a  Fasting Blood Sugar - 190-200s Checks Blood Sugar 3 times a day      . THE MORNING OF SURGERY, take 7 units of Insuiln Glargine.  . If your CBG is greater than 220 mg/dL, you may take  of your sliding scale (correction) dose of insulin.  . If your blood sugar is less than 70 mg/dL, you will need to treat for low blood sugar: o Treat a low blood sugar (less than 70 mg/dL) with  cup of clear juice (cranberry or apple), 4 glucose tablets, OR glucose gel. o Recheck blood sugar in 15 minutes after treatment (to make sure it is greater than 70 mg/dL). If your blood sugar is not greater than 70 mg/dL on recheck, call [phone] for further instructions.  STOP now taking any Aspirin (unless otherwise instructed by your surgeon), Aleve, Naproxen, Ibuprofen, Motrin, Advil, Goody's, BC's, all herbal medications, fish oil, and all vitamins.   Coronavirus Screening Covid test on 11/21/20 was negative. Do you have any of the following symptoms:  Cough yes/no: No Fever (>100.68F)  yes/no: No Runny nose yes/no: No Sore throat yes/no: No Difficulty breathing/shortness of breath  yes/no: No  Have you traveled in the last 14 days and where? yes/no: No  Clovis Pu, LPN verbalized understanding of instructions that were given via phone.  Instructions for day of surgery were also faxed to [phone].

## 2020-11-23 NOTE — Progress Notes (Addendum)
TO:  Clovis Pu, LPN  Date:  1/56/15  [id number] Instructions for Jordain Radin Day of Surgery 11/24/20    Your procedure is scheduled on Thursday, 11/24/20.  Report to [facility]" at 8:45 A.M., then check in with the Admitting office.   Call this number if you have problems the morning of surgery:  [phone]   If you have any questions prior to your surgery date call [phone]: Open Monday-Friday 8am-4pm    Remember:  Do not eat after midnight the night before your surgery-Wed  Follow your surgeon's instructions for diet and prep for procedure.    Take these medicines the morning of surgery with A SIP OF WATER: acetaminophen (TYLENOL) if needed escitalopram (LEXAPRO) gabapentin (NEURONTIN)  pantoprazole (PROTONIX)  Take 7 units of Insulin Glargine morning of surgery.                     Do not wear jewelry, make up, or nail polish            Do not wear lotions, powders, perfumes, or deodorant.            Do not shave 48 hours prior to surgery.             Do not bring valuables to the hospital.            [facility] is not responsible for any belongings or valuables.   Contacts, glasses, dentures or bridgework may not be worn into surgery, please bring cases for these belongings.   Patients discharged the day of surgery will not be allowed to drive home, and someone needs to stay with them for 24 hours.    Day of Surgery:  Thursday 11/24/20 Wear Clean/Comfortable clothing the morning of surgery Do not apply any deodorants/lotions.   Remember to brush your teeth WITH YOUR REGULAR TOOTHPASTE.

## 2020-11-24 ENCOUNTER — Ambulatory Visit (HOSPITAL_COMMUNITY)
Admission: RE | Admit: 2020-11-24 | Discharge: 2020-11-24 | Disposition: A | Discharge status: Skilled Nursing Facility | Payer: Medicare Other | Attending: Gastroenterology | Admitting: Gastroenterology

## 2020-11-24 ENCOUNTER — Ambulatory Visit (HOSPITAL_COMMUNITY): Payer: Medicare Other | Admitting: Certified Registered"

## 2020-11-24 ENCOUNTER — Other Ambulatory Visit: Payer: Self-pay

## 2020-11-24 ENCOUNTER — Encounter (HOSPITAL_COMMUNITY): Payer: Self-pay | Admitting: Gastroenterology

## 2020-11-24 ENCOUNTER — Encounter (HOSPITAL_COMMUNITY)
Admission: RE | Disposition: A | Discharge status: Skilled Nursing Facility | Payer: Self-pay | Source: Home / Self Care | Attending: Gastroenterology

## 2020-11-24 DIAGNOSIS — R195 Other fecal abnormalities: Secondary | ICD-10-CM | POA: Insufficient documentation

## 2020-11-24 DIAGNOSIS — Z791 Long term (current) use of non-steroidal anti-inflammatories (NSAID): Secondary | ICD-10-CM | POA: Insufficient documentation

## 2020-11-24 DIAGNOSIS — R197 Diarrhea, unspecified: Secondary | ICD-10-CM | POA: Insufficient documentation

## 2020-11-24 DIAGNOSIS — K2950 Unspecified chronic gastritis without bleeding: Secondary | ICD-10-CM | POA: Diagnosis not present

## 2020-11-24 DIAGNOSIS — D123 Benign neoplasm of transverse colon: Secondary | ICD-10-CM | POA: Diagnosis not present

## 2020-11-24 DIAGNOSIS — K641 Second degree hemorrhoids: Secondary | ICD-10-CM | POA: Diagnosis not present

## 2020-11-24 DIAGNOSIS — D122 Benign neoplasm of ascending colon: Secondary | ICD-10-CM | POA: Diagnosis not present

## 2020-11-24 DIAGNOSIS — D509 Iron deficiency anemia, unspecified: Secondary | ICD-10-CM | POA: Insufficient documentation

## 2020-11-24 DIAGNOSIS — K5641 Fecal impaction: Secondary | ICD-10-CM | POA: Diagnosis not present

## 2020-11-24 DIAGNOSIS — Z79899 Other long term (current) drug therapy: Secondary | ICD-10-CM | POA: Insufficient documentation

## 2020-11-24 DIAGNOSIS — K6289 Other specified diseases of anus and rectum: Secondary | ICD-10-CM | POA: Diagnosis not present

## 2020-11-24 DIAGNOSIS — Z794 Long term (current) use of insulin: Secondary | ICD-10-CM | POA: Diagnosis not present

## 2020-11-24 DIAGNOSIS — E1122 Type 2 diabetes mellitus with diabetic chronic kidney disease: Secondary | ICD-10-CM | POA: Insufficient documentation

## 2020-11-24 DIAGNOSIS — D124 Benign neoplasm of descending colon: Secondary | ICD-10-CM | POA: Insufficient documentation

## 2020-11-24 DIAGNOSIS — D125 Benign neoplasm of sigmoid colon: Secondary | ICD-10-CM | POA: Insufficient documentation

## 2020-11-24 DIAGNOSIS — K635 Polyp of colon: Secondary | ICD-10-CM

## 2020-11-24 DIAGNOSIS — N189 Chronic kidney disease, unspecified: Secondary | ICD-10-CM | POA: Diagnosis not present

## 2020-11-24 DIAGNOSIS — Z9641 Presence of insulin pump (external) (internal): Secondary | ICD-10-CM | POA: Diagnosis not present

## 2020-11-24 DIAGNOSIS — I129 Hypertensive chronic kidney disease with stage 1 through stage 4 chronic kidney disease, or unspecified chronic kidney disease: Secondary | ICD-10-CM | POA: Diagnosis not present

## 2020-11-24 HISTORY — DX: Anemia, unspecified: D64.9

## 2020-11-24 HISTORY — PX: ESOPHAGOGASTRODUODENOSCOPY (EGD) WITH PROPOFOL: SHX5813

## 2020-11-24 HISTORY — PX: IMPACTION REMOVAL: SHX5858

## 2020-11-24 HISTORY — PX: POLYPECTOMY: SHX5525

## 2020-11-24 HISTORY — DX: Gastro-esophageal reflux disease without esophagitis: K21.9

## 2020-11-24 HISTORY — PX: COLONOSCOPY WITH PROPOFOL: SHX5780

## 2020-11-24 HISTORY — PX: BIOPSY: SHX5522

## 2020-11-24 LAB — GLUCOSE, CAPILLARY: Glucose-Capillary: 239 mg/dL — ABNORMAL HIGH (ref 70–99)

## 2020-11-24 SURGERY — ESOPHAGOGASTRODUODENOSCOPY (EGD) WITH PROPOFOL
Anesthesia: Monitor Anesthesia Care

## 2020-11-24 MED ORDER — LIDOCAINE 2% (20 MG/ML) 5 ML SYRINGE
INTRAMUSCULAR | Status: DC | PRN
  Administered 2020-11-24: 60 mg via INTRAVENOUS

## 2020-11-24 MED ORDER — PROPOFOL 500 MG/50ML IV EMUL
INTRAVENOUS | Status: DC | PRN
  Administered 2020-11-24: 150 ug/kg/min via INTRAVENOUS

## 2020-11-24 MED ORDER — PROPOFOL 10 MG/ML IV BOLUS
INTRAVENOUS | Status: DC | PRN
  Administered 2020-11-24: 20 mg via INTRAVENOUS

## 2020-11-24 MED ORDER — LACTATED RINGERS IV SOLN: INTRAVENOUS | Status: DC

## 2020-11-24 SURGICAL SUPPLY — 24 items

## 2020-11-24 NOTE — Discharge Instructions (Signed)

## 2020-11-24 NOTE — Interval H&P Note (Signed)
History and Physical Interval Note:  11/24/2020 10:38 AM  Julia Shea  has presented today for surgery, with the diagnosis of +Heme Stool, Anemia.  The various methods of treatment have been discussed with the patient and family. After consideration of risks, benefits and other options for treatment, the patient has consented to  Procedure(s): ESOPHAGOGASTRODUODENOSCOPY (EGD) WITH PROPOFOL (N/A) COLONOSCOPY WITH PROPOFOL (N/A) as a surgical intervention.  The patient's history has been reviewed, patient examined, no change in status, stable for surgery.  I have reviewed the patient's chart and labs.  Questions were answered to the patient's satisfaction.     [employer]

## 2020-11-24 NOTE — Transfer of Care (Signed)
Immediate Anesthesia Transfer of Care Note  Patient: Julia Shea  Procedure(s) Performed: ESOPHAGOGASTRODUODENOSCOPY (EGD) WITH PROPOFOL (N/A ) COLONOSCOPY WITH PROPOFOL (N/A ) BIOPSY IMPACTION REMOVAL POLYPECTOMY  Patient Location: Endoscopy Unit  Anesthesia Type:MAC  Level of Consciousness: drowsy  Airway & Oxygen Therapy: Patient Spontanous Breathing and Patient connected to nasal cannula oxygen  Post-op Assessment: Report given to RN and Post -op Vital signs reviewed and stable  Post vital signs: Reviewed and stable  Last Vitals:  Vitals Value Taken Time  BP 91/50 11/24/20 1214  Temp    Pulse 85 11/24/20 1215  Resp 13 11/24/20 1215  SpO2 97 % 11/24/20 1215  Vitals shown include unvalidated device data.  Last Pain:  Vitals:   11/24/20 1214  TempSrc:   PainSc: Asleep         Complications: No complications documented.

## 2020-11-24 NOTE — Op Note (Signed)
[facility] Patient Name: Julia Shea Procedure Date : 11/24/2020 MRN: [id number] Attending MD: Justice Britain , MD Date of Birth: 1956-04-12 CSN: [id number] Age: 65 Admit Type: Inpatient Procedure:                Upper GI endoscopy Indications:              Generalized abdominal pain, Iron deficiency anemia,                            Occult blood in stool Providers:                Justice Britain, MD, Josie Dixon, RN,                            Jeanella Cara, RN, Ladona Ridgel,                            Technician Referring MD:             Willia Craze Medicines:                Monitored Anesthesia Care Complications:            No immediate complications. Estimated Blood Loss:     Estimated blood loss was minimal. Procedure:                Pre-Anesthesia Assessment:                           - Prior to the procedure, a History and Physical                            was performed, and patient medications and                            allergies were reviewed. The patient's tolerance of                            previous anesthesia was also reviewed. The risks                            and benefits of the procedure and the sedation                            options and risks were discussed with the patient.                            All questions were answered, and informed consent                            was obtained. Prior Anticoagulants: The patient has                            taken no previous anticoagulant or antiplatelet                            agents. ASA  Grade Assessment: III - A patient with                            severe systemic disease. After reviewing the risks                            and benefits, the patient was deemed in                            satisfactory condition to undergo the procedure.                           After obtaining informed consent, the endoscope was                            passed  under direct vision. Throughout the                            procedure, the patient's blood pressure, pulse, and                            oxygen saturations were monitored continuously. The                            GIF-H190 ([id number]) Olympus gastroscope was                            introduced through the mouth, and advanced to the                            second part of duodenum. The upper GI endoscopy was                            accomplished without difficulty. The patient                            tolerated the procedure. Scope In: Scope Out: Findings:      No gross lesions were noted in the entire esophagus.      The Z-line was regular and was found 36 cm from the incisors.      Patchy mildly erythematous mucosa without bleeding was found in the       entire examined stomach.      No gross lesions were noted in the entire examined stomach. Biopsies       were taken with a cold forceps for histology and Helicobacter pylori       testing.      No gross lesions were noted in the duodenal bulb, in the first portion       of the duodenum and in the second portion of the duodenum. Biopsies for       histology were taken with a cold forceps for evaluation of celiac       disease. Impression:               - No gross lesions in esophagus. Z-line regular, 36  cm from the incisors.                           - Erythematous mucosa in the stomach. No other                            gross lesions in the stomach. Biopsied.                           - No gross lesions in the duodenal bulb, in the                            first portion of the duodenum and in the second                            portion of the duodenum. Biopsied. Recommendation:           - Proceed to scheduled colonoscopy.                           - Observe patient's clinical course.                           - Continue present medications.                           - Await pathology  results.                           - The findings and recommendations were discussed                            with the patient. Procedure Code(s):        --- Professional ---                           [phone], Esophagogastroduodenoscopy, flexible,                            transoral; with biopsy, single or multiple Diagnosis Code(s):        --- Professional ---                           K31.89, Other diseases of stomach and duodenum                           R10.84, Generalized abdominal pain                           D50.9, Iron deficiency anemia, unspecified                           R19.5, Other fecal abnormalities CPT copyright 2019 American Medical Association. All rights reserved. The codes documented in this report are preliminary and upon coder review may  be revised to meet current compliance requirements. Justice Britain, MD 11/24/2020 12:25:05 PM Number of Addenda: 0

## 2020-11-24 NOTE — Anesthesia Preprocedure Evaluation (Signed)
Anesthesia Evaluation  Patient identified by MRN, date of birth, ID band Patient awake    Reviewed: Allergy & Precautions, NPO status , Patient's Chart, lab work & pertinent test results  History of Anesthesia Complications Negative for: history of anesthetic complications  Airway Mallampati: II  TM Distance: >3 FB Neck ROM: Full    Dental  (+) Poor Dentition   Pulmonary asthma ,    Pulmonary exam normal        Cardiovascular hypertension, Normal cardiovascular exam     Neuro/Psych Depression negative neurological ROS     GI/Hepatic Neg liver ROS, GERD  ,  Endo/Other  diabetes, Type 2, Insulin Dependent  Renal/GU negative Renal ROS     Musculoskeletal negative musculoskeletal ROS (+)   Abdominal   Peds  Hematology  (+) anemia ,   Anesthesia Other Findings   Reproductive/Obstetrics                             Anesthesia Physical Anesthesia Plan  ASA: III  Anesthesia Plan: MAC   Post-op Pain Management:    Induction: Intravenous  PONV Risk Score and Plan: 2 and Propofol infusion, TIVA and Treatment may vary due to age or medical condition  Airway Management Planned: Natural Airway, Nasal Cannula and Simple Face Mask  Additional Equipment: None  Intra-op Plan:   Post-operative Plan:   Informed Consent: I have reviewed the patients History and Physical, chart, labs and discussed the procedure including the risks, benefits and alternatives for the proposed anesthesia with the patient or authorized representative who has indicated his/her understanding and acceptance.       Plan Discussed with:   Anesthesia Plan Comments:         Anesthesia Quick Evaluation

## 2020-11-24 NOTE — Progress Notes (Signed)
Report given to DON Kayla at [facility].  Reviewed procedure diagnosis with RN and Dr. Donneta Romberg recommendations.  RN verbalized understanding.  Pt awaiting transport back to facility.  Will call caretaker when [facility] arrives for transport.  Will continue to monitor pt.

## 2020-11-24 NOTE — Op Note (Signed)
[facility] Patient Name: Julia Shea Procedure Date : 11/24/2020 MRN: [id number] Attending MD: Justice Britain , MD Date of Birth: May 17, 1956 CSN: [id number] Age: 65 Admit Type: Inpatient Procedure:                Colonoscopy Indications:              Clinically significant diarrhea of unexplained                            origin, Gastrointestinal occult blood loss, Iron                            deficiency anemia Providers:                Justice Britain, MD, Josie Dixon, RN,                            Jeanella Cara, RN, Ladona Ridgel,                            Technician Referring MD:             Willia Craze Medicines:                Monitored Anesthesia Care Complications:            No immediate complications. Estimated Blood Loss:     Estimated blood loss was minimal. Procedure:                Pre-Anesthesia Assessment:                           - Prior to the procedure, a History and Physical                            was performed, and patient medications and                            allergies were reviewed. The patient's tolerance of                            previous anesthesia was also reviewed. The risks                            and benefits of the procedure and the sedation                            options and risks were discussed with the patient.                            All questions were answered, and informed consent                            was obtained. Prior Anticoagulants: The patient has                            taken no previous anticoagulant or antiplatelet  agents. ASA Grade Assessment: III - A patient with                            severe systemic disease. After reviewing the risks                            and benefits, the patient was deemed in                            satisfactory condition to undergo the procedure.                           After obtaining informed  consent, the colonoscope                            was passed under direct vision. Throughout the                            procedure, the patient's blood pressure, pulse, and                            oxygen saturations were monitored continuously. The                            PCF-[id number] ([id number]) Olympus pediatric colonscope                            was introduced through the anus and advanced to the                            the cecum, identified by appendiceal orifice and                            ileocecal valve. The colonoscopy was performed                            without difficulty. The patient tolerated the                            procedure. The quality of the bowel preparation was                            fair. The ileocecal valve, appendiceal orifice, and                            rectum were photographed. Scope In: 11:35:05 AM Scope Out: 12:07:04 PM Scope Withdrawal Time: 0 hours 25 minutes 29 seconds  Total Procedure Duration: 0 hours 31 minutes 59 seconds  Findings:      The digital rectal exam findings include hemorrhoids. Pertinent       negatives include no palpable rectal lesions.      Copious quantities of semi-solid solid stool was found in the rectum and       in the recto-sigmoid colon, interfering with visualization. Fecal  disimpaction via DRE was performed.      Lavage of the area was performed using copious amounts, resulting in       clearance with fair visualization.      Nine, sessile polyps were found in the sigmoid colon (2), descending       colon (2), transverse colon (3) and ascending colon (2). The polyps were       2 to 12 mm in size. These polyps were removed with a cold snare.       Resection and retrieval were complete.      Normal mucosa was found in the recto-sigmoid colon, in the sigmoid       colon, in the descending colon, in the transverse colon, in the       ascending colon and in the cecum. Biopsies for histology were  taken with       a cold forceps for evaluation of microscopic colitis.      Patchy moderate inflammation characterized by congestion (edema),       erosions, friability and granularity was found in the rectum. Biopsies       were taken with a cold forceps for histology.      Non-bleeding non-thrombosed external and internal hemorrhoids were found       during retroflexion, during perianal exam and during digital exam. The       hemorrhoids were Grade II (internal hemorrhoids that prolapse but reduce       spontaneously). Impression:               - Hemorrhoids found on digital rectal exam as well                            as solid dark stool.                           - Preparation of the colon was fair and significant                            amount of solid stool noted in the rectum and                            rectosigmoid. This required disimpaction via                            anesthesia for about 5-minutes before initiating                            attempt at colonoscopy. The rest of the colon                            preparation was only fair thereafter but adequate                            for visualization for this procedure.                           - Nine, 2 to 12 mm polyps in the sigmoid colon, in  the descending colon, in the transverse colon and                            in the ascending colon, removed with a cold snare.                            Resected and retrieved.                           - Normal mucosa in the recto-sigmoid colon, in the                            sigmoid colon, in the descending colon, in the                            transverse colon, in the ascending colon and in the                            cecum. Biopsied.                           - Patchy moderate inflammation was found in the                            rectum query proctitis as a result of stercoral                            changes from chronic  constipation vs chronic                            proctitis. Biopsied.                           - Non-bleeding non-thrombosed external and internal                            hemorrhoids. Recommendation:           - The patient will be observed post-procedure,                            until all discharge criteria are met.                           - Discharge patient to home.                           - Patient has a contact number available for                            emergencies. The signs and symptoms of potential                            delayed complications were discussed with the  patient. Return to normal activities tomorrow.                            Written discharge instructions were provided to the                            patient.                           - High fiber diet.                           - Patient's diarrhea most likely is felt to likely                            be obstipation as a result of constipation.                           - Use FiberCon 1-2 tablets PO daily.                           - Recommend considering initiating Miralax 1-2                            times daily with Colace 200 mg daily.                           - Await pathology results.                           - Consider Preparation H ointment/cream 1-2 times                            daily.                           - Sitz bathes PRN nightly.                           - If BRBPR or issues from a hemorrhoid perspective                            occur then referral to Colorectal surgery for                            consideration of Internal/External hemorrhoid                            therapies can be considered.                           - Recommend repeat CBC/Iron/TIBC/Ferritin to be                            checked in 8-weeks. If no evidence of IDA, then  referral to Hematology to ensure no other issues                             can be found in regards to chronic anemia. Would                            keep patient on oral iron daily however at this                            time. Would not pursue VCE unless overt Iron                            deficiency is noted.                           - Repeat this colonoscopy within 1 year due to the                            only fair preparation noted on today's examination                            and number of polyps found since other dysplastic                            lesions could have been missed if flat/small. No                            large lesions/malignanies were noted. Will need                            optimization of bowel habits and 2-day preparation                            (she did not tolerate 1/2 of her preparation by the                            discussion with the facility).                           - Follow up in clinic will be arranged.                           - The findings and recommendations were discussed                            with the patient. Procedure Code(s):        --- Professional ---                           [phone], Colonoscopy, flexible; with removal of                            tumor(s), polyp(s), or other lesion(s) by snare  technique                           45380, 59, Colonoscopy, flexible; with biopsy,                            single or multiple Diagnosis Code(s):        --- Professional ---                           K64.1, Second degree hemorrhoids                           K63.5, Polyp of colon                           K62.89, Other specified diseases of anus and rectum                           R19.7, Diarrhea, unspecified                           R19.5, Other fecal abnormalities                           D50.9, Iron deficiency anemia, unspecified CPT copyright 2019 American Medical Association. All rights reserved. The codes documented in this report are  preliminary and upon coder review may  be revised to meet current compliance requirements. Justice Britain, MD 11/24/2020 12:40:59 PM Number of Addenda: 0

## 2020-11-24 NOTE — Anesthesia Postprocedure Evaluation (Signed)
Anesthesia Post Note  Patient: Julia Shea  Procedure(s) Performed: ESOPHAGOGASTRODUODENOSCOPY (EGD) WITH PROPOFOL (N/A ) COLONOSCOPY WITH PROPOFOL (N/A ) BIOPSY IMPACTION REMOVAL POLYPECTOMY     Patient location during evaluation: Endoscopy Anesthesia Type: MAC Level of consciousness: awake and alert Pain management: pain level controlled Vital Signs Assessment: post-procedure vital signs reviewed and stable Respiratory status: spontaneous breathing, nonlabored ventilation and respiratory function stable Cardiovascular status: blood pressure returned to baseline and stable Postop Assessment: no apparent nausea or vomiting Anesthetic complications: no   No complications documented.  Last Vitals:  Vitals:   11/24/20 1225 11/24/20 1235  BP: 117/70 (!) 150/90  Pulse: 88 89  Resp: 17 17  Temp: 36.4 C   SpO2: 100% 100%    Last Pain:  Vitals:   11/24/20 1235  TempSrc:   PainSc: 0-No pain                 Lidia Collum

## 2020-11-24 NOTE — Anesthesia Procedure Notes (Signed)
Procedure Name: MAC Date/Time: 11/24/2020 11:09 AM Performed by: Imagene Riches, CRNA Pre-anesthesia Checklist: Patient identified, Emergency Drugs available, Suction available, Patient being monitored and Timeout performed Patient Re-evaluated:Patient Re-evaluated prior to induction Oxygen Delivery Method: Nasal cannula

## 2020-11-25 ENCOUNTER — Encounter (HOSPITAL_COMMUNITY): Payer: Self-pay | Admitting: Gastroenterology

## 2020-11-25 ENCOUNTER — Encounter: Payer: Self-pay | Admitting: Gastroenterology

## 2020-11-25 LAB — SURGICAL PATHOLOGY

## 2020-12-19 ENCOUNTER — Other Ambulatory Visit (HOSPITAL_COMMUNITY): Payer: Medicare Other

## 2020-12-20 ENCOUNTER — Other Ambulatory Visit: Payer: Self-pay

## 2020-12-20 ENCOUNTER — Emergency Department (HOSPITAL_COMMUNITY): Payer: Medicare Other

## 2020-12-20 ENCOUNTER — Encounter (HOSPITAL_COMMUNITY): Payer: Self-pay | Admitting: *Deleted

## 2020-12-20 ENCOUNTER — Emergency Department (HOSPITAL_COMMUNITY)
Admission: EM | Admit: 2020-12-20 | Discharge: 2020-12-21 | Disposition: A | Discharge status: Home / Self Care | Payer: Medicare Other | Attending: Emergency Medicine | Admitting: Emergency Medicine

## 2020-12-20 DIAGNOSIS — I10 Essential (primary) hypertension: Secondary | ICD-10-CM | POA: Insufficient documentation

## 2020-12-20 DIAGNOSIS — Z794 Long term (current) use of insulin: Secondary | ICD-10-CM | POA: Insufficient documentation

## 2020-12-20 DIAGNOSIS — E119 Type 2 diabetes mellitus without complications: Secondary | ICD-10-CM | POA: Diagnosis not present

## 2020-12-20 DIAGNOSIS — T83511A Infection and inflammatory reaction due to indwelling urethral catheter, initial encounter: Secondary | ICD-10-CM | POA: Insufficient documentation

## 2020-12-20 DIAGNOSIS — N390 Urinary tract infection, site not specified: Secondary | ICD-10-CM | POA: Diagnosis not present

## 2020-12-20 DIAGNOSIS — Z8616 Personal history of COVID-19: Secondary | ICD-10-CM | POA: Insufficient documentation

## 2020-12-20 DIAGNOSIS — R1084 Generalized abdominal pain: Secondary | ICD-10-CM

## 2020-12-20 DIAGNOSIS — R1032 Left lower quadrant pain: Secondary | ICD-10-CM | POA: Diagnosis present

## 2020-12-20 LAB — URINALYSIS, ROUTINE W REFLEX MICROSCOPIC
Bilirubin Urine: NEGATIVE
Glucose, UA: NEGATIVE mg/dL
Hgb urine dipstick: NEGATIVE
Ketones, ur: NEGATIVE mg/dL
Nitrite: NEGATIVE
Protein, ur: NEGATIVE mg/dL
Specific Gravity, Urine: 1.02 (ref 1.005–1.030)
pH: 7 (ref 5.0–8.0)

## 2020-12-20 LAB — CBC WITH DIFFERENTIAL/PLATELET
Abs Immature Granulocytes: 0.06 10*3/uL (ref 0.00–0.07)
Basophils Absolute: 0 10*3/uL (ref 0.0–0.1)
Basophils Relative: 0 %
Eosinophils Absolute: 0.3 10*3/uL (ref 0.0–0.5)
Eosinophils Relative: 3 %
HCT: 31.6 % — ABNORMAL LOW (ref 36.0–46.0)
Hemoglobin: 10.3 g/dL — ABNORMAL LOW (ref 12.0–15.0)
Immature Granulocytes: 1 %
Lymphocytes Relative: 21 %
Lymphs Abs: 2.1 10*3/uL (ref 0.7–4.0)
MCH: 28.3 pg (ref 26.0–34.0)
MCHC: 32.6 g/dL (ref 30.0–36.0)
MCV: 86.8 fL (ref 80.0–100.0)
Monocytes Absolute: 0.6 10*3/uL (ref 0.1–1.0)
Monocytes Relative: 6 %
Neutro Abs: 7.1 10*3/uL (ref 1.7–7.7)
Neutrophils Relative %: 69 %
Platelets: 343 10*3/uL (ref 150–400)
RBC: 3.64 MIL/uL — ABNORMAL LOW (ref 3.87–5.11)
RDW: 13.6 % (ref 11.5–15.5)
WBC: 10.2 10*3/uL (ref 4.0–10.5)
nRBC: 0 % (ref 0.0–0.2)

## 2020-12-20 LAB — COMPREHENSIVE METABOLIC PANEL
ALT: 14 U/L (ref 0–44)
AST: 14 U/L — ABNORMAL LOW (ref 15–41)
Albumin: 3.7 g/dL (ref 3.5–5.0)
Alkaline Phosphatase: 96 U/L (ref 38–126)
Anion gap: 7 (ref 5–15)
BUN: 36 mg/dL — ABNORMAL HIGH (ref 8–23)
CO2: 25 mmol/L (ref 22–32)
Calcium: 9.3 mg/dL (ref 8.9–10.3)
Chloride: 105 mmol/L (ref 98–111)
Creatinine, Ser: 1.2 mg/dL — ABNORMAL HIGH (ref 0.44–1.00)
GFR, Estimated: 50 mL/min — ABNORMAL LOW (ref >60)
Glucose, Bld: 184 mg/dL — ABNORMAL HIGH (ref 70–99)
Potassium: 4.9 mmol/L (ref 3.5–5.1)
Sodium: 137 mmol/L (ref 135–145)
Total Bilirubin: 0.7 mg/dL (ref 0.3–1.2)
Total Protein: 6.8 g/dL (ref 6.5–8.1)

## 2020-12-20 LAB — LIPASE, BLOOD: Lipase: 24 U/L (ref 11–51)

## 2020-12-20 MED ORDER — SODIUM CHLORIDE 0.9 % IV BOLUS
500.0000 mL | Freq: Once | INTRAVENOUS | Status: AC
  Administered 2020-12-20: 500 mL via INTRAVENOUS

## 2020-12-20 MED ORDER — IOHEXOL 300 MG/ML  SOLN
100.0000 mL | Freq: Once | INTRAMUSCULAR | Status: AC | PRN
  Administered 2020-12-20: 100 mL via INTRAVENOUS

## 2020-12-20 MED ORDER — NITROFURANTOIN MONOHYD MACRO 100 MG PO CAPS: 100.0000 mg | ORAL_CAPSULE | Freq: Two times a day (BID) | ORAL | 0 refills | Status: DC

## 2020-12-20 NOTE — ED Notes (Signed)
[facility] called for patient transport 

## 2020-12-20 NOTE — ED Triage Notes (Signed)
BIB EMS from [facility]  with abd pain times a week. X ray yesterday ? Bowel obstruction.  130/74-84-96% RA CBG 229

## 2020-12-20 NOTE — ED Provider Notes (Signed)
[place] DEPT Provider Note   CSN: [id number] Arrival date & time: 12/20/20  1215     History Chief Complaint  Patient presents with  . Abdominal Pain    Julia Shea is a 65 y.o. female.  Is a resident of a skilled nursing facility.  She states that she had COVID-19 about a year ago and was critically ill.  She developed left lower quadrant abdominal pain, nausea, and vomiting about a week ago, and her symptoms have progressed.  The history is provided by the patient.  Abdominal Pain Pain location:  LLQ Pain quality: sharp   Pain radiates to:  Does not radiate Pain severity:  Severe Onset quality:  Gradual Duration:  1 week Timing:  Constant Progression:  Worsening Chronicity:  New Context: previous surgery (one laparoscopic surgery for fibroids)   Context: not sick contacts and not suspicious food intake   Relieved by:  Nothing Worsened by:  Nothing Associated symptoms: no chest pain, no chills, no constipation, no cough, no diarrhea, no dysuria, no fever, no hematuria, no shortness of breath, no sore throat and no vomiting        Past Medical History:  Diagnosis Date  . Anemia   . Asthma    no inhalers  . Bilateral hip pain   . 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 Cataract    bilateral, clinical blind  . Chronic indwelling Foley catheter   . Closed comminuted left humeral fracture    ORIF in June 2017  . Depression   . Diabetes mellitus type 2, with complication, on long term insulin pump ([place])   . Falls frequently    none since 03/2020  . GERD (gastroesophageal reflux disease)   . Hypertension   . MRSA infection   . Nausea & vomiting   . Rotator cuff tear arthropathy, left     Patient Active Problem List   Diagnosis Date Noted  . Occult blood in stools 03/27/2020  . Anemia 03/27/2020  . Elevated alkaline phosphatase level 03/27/2020  . Abdominal  discomfort in left lower quadrant 03/27/2020  . Diarrhea   . Hip pain   . Anemia of chronic disease   . Left groin pain   . Cavitary lung disease   . Hypotension   . Hyponatremia 08/21/2019  . Acute lower UTI 08/21/2019  . AKI (acute kidney injury) ([place]) 08/21/2019  . Severe malnutrition ([place]) 03/31/2018  . Hypokalemia 03/29/2018  . Diabetes mellitus type 2, with complication, on long term insulin pump ([place])   . Nausea & vomiting   . Chronic indwelling Foley catheter   . Bladder problem   . Rotator cuff tear arthropathy, left   . Malnutrition of moderate degree 01/24/2018  . Pressure injury of skin 12/22/2017  . Hepatic steatosis 05/15/2013  . Abdominal pain, right upper quadrant 05/07/2013  . Breast cancer screening 11/14/2011  . Hypertriglyceridemia 10/30/2011  . Depression 10/26/2011  . Diabetes mellitus type 2, uncontrolled ([place]) 10/26/2011  . Hypertension 10/26/2011    Past Surgical History:  Procedure Laterality Date  . BIOPSY  11/24/2020   Procedure: BIOPSY;  Surgeon: Rush Landmark Telford Nab., MD;  Location: [place];  Service: Gastroenterology;;  . COLONOSCOPY WITH PROPOFOL N/A 11/24/2020   Procedure: COLONOSCOPY WITH PROPOFOL;  Surgeon: Irving Copas., MD;  Location: [place];  Service: Gastroenterology;  Laterality: N/A;  . ESOPHAGOGASTRODUODENOSCOPY (EGD) WITH PROPOFOL N/A 11/24/2020   Procedure:  ESOPHAGOGASTRODUODENOSCOPY (EGD) WITH PROPOFOL;  Surgeon: Mansouraty, Telford Nab., MD;  Location: [place];  Service: Gastroenterology;  Laterality: N/A;  . EYE SURGERY Bilateral    cataracts  . IMPACTION REMOVAL  11/24/2020   Procedure: IMPACTION REMOVAL;  Surgeon: Rush Landmark Telford Nab., MD;  Location: [place];  Service: Gastroenterology;;  . LAPAROSCOPIC OOPHORECTOMY     removal of cyst  . POLYPECTOMY  11/24/2020   Procedure: POLYPECTOMY;  Surgeon: Rush Landmark Telford Nab., MD;  Location: [place];  Service: Gastroenterology;;  . TEE WITHOUT  CARDIOVERSION N/A 09/07/2019   Procedure: TRANSESOPHAGEAL ECHOCARDIOGRAM (TEE);  Surgeon: Teodoro Spray, MD;  Location: [facility];  Service: Cardiovascular;  Laterality: N/A;     OB History    Gravida  0   Para  0   Term  0   Preterm  0   AB  0   Living  0     SAB  0   IAB  0   Ectopic  0   Multiple  0   Live Births  0           Family History  Problem Relation Age of Onset  . Heart attack Mother   . Heart attack Father   . Diabetes Maternal Uncle   . Colon cancer Neg Hx   . Esophageal cancer Neg Hx   . Inflammatory bowel disease Neg Hx   . Liver disease Neg Hx   . Pancreatic cancer Neg Hx   . Rectal cancer Neg Hx   . Stomach cancer Neg Hx     Social History   Tobacco Use  . Smoking status: Never Smoker  . Smokeless tobacco: Never Used  Vaping Use  . Vaping Use: Never used  Substance Use Topics  . Alcohol use: No  . Drug use: No    Home Medications Prior to Admission medications   Medication Sig Start Date End Date Taking? Authorizing Provider  acetaminophen (TYLENOL) 325 MG tablet Take 650 mg by mouth every 8 (eight) hours as needed for moderate pain.    [provider]  ascorbic acid (VITAMIN C) 500 MG tablet Take 500 mg by mouth 2 (two) times daily.    [provider]  escitalopram (LEXAPRO) 10 MG tablet Take 15 mg by mouth daily.    [provider]  ferrous sulfate 325 (65 FE) MG tablet Take 1 tablet (325 mg total) by mouth daily with breakfast. Patient taking differently: Take 325 mg by mouth 2 (two) times daily with a meal. 09/09/19   Nicole Kindred A, DO  gabapentin (NEURONTIN) 100 MG capsule Take 100 mg by mouth 2 (two) times daily.    [provider]  insulin aspart (NOVOLOG) 100 UNIT/ML injection Inject 2-10 Units into the skin 3 (three) times daily with meals. If blood sugar is 151-200= 2 units, 201-250= 4 units, 251-300= 6 units, 301-350= 8 units, 351-400= 10 units. If blood sugar is greater than 400,  call MD.    [provider]  Insulin Glargine (BASAGLAR KWIKPEN) 100 UNIT/ML Inject 15 Units into the skin daily.    [provider]  liver oil-zinc oxide (DESITIN) 40 % ointment Apply topically 3 (three) times daily. Patient taking differently: Apply 1 application topically 3 (three) times daily. Apply to perianal area 09/08/19   Nicole Kindred A, DO  magnesium oxide (MAG-OX) 400 (241.3 Mg) MG tablet Take 1 tablet (400 mg total) by mouth daily. 09/09/19   Nicole Kindred A, DO  mirabegron ER (MYRBETRIQ) 25  MG TB24 tablet Take 25 mg by mouth daily.    [provider]  Multiple Vitamins-Minerals (MULTIVITAMIN WITH MINERALS) tablet Take 1 tablet by mouth daily. 02/06/18   Tukov-Yual, Arlyss Gandy, NP  pantoprazole (PROTONIX) 40 MG tablet Take 40 mg by mouth 2 (two) times daily.    [provider]  PEG-KCl-NaCl-NaSulf-Na Asc-C (PLENVU) 140 g SOLR Take 1 kit by mouth as directed. Use coupon: BIN: [id number] PNC: CNRX Group: [id number] ID: [id number] 11/02/20   Willia Craze, NP  potassium chloride (KLOR-CON) 10 MEQ tablet Take 1 tablet (10 mEq total) by mouth daily. 09/08/19   Ezekiel Slocumb, DO  traZODone (DESYREL) 50 MG tablet Take 50 mg by mouth at bedtime.    [provider]    Allergies    Penicillins and Sulfa antibiotics  Review of Systems   Review of Systems  Constitutional: Negative for chills and fever.  HENT: Negative for ear pain and sore throat.   Eyes: Negative for pain and visual disturbance.  Respiratory: Negative for cough and shortness of breath.   Cardiovascular: Negative for chest pain and palpitations.  Gastrointestinal: Positive for abdominal pain. Negative for constipation, diarrhea and vomiting.  Genitourinary: Negative for dysuria and hematuria.  Musculoskeletal: Negative for arthralgias and back pain.  Skin: Negative for color change and rash.  Neurological: Negative for seizures and syncope.  All other systems reviewed and  are negative.   Physical Exam Updated Vital Signs Wt 62.6 kg   BMI 25.24 kg/m   Physical Exam Vitals and nursing note reviewed.  Constitutional:      General: She is not in acute distress.    Appearance: She is well-developed.  HENT:     Head: Normocephalic and atraumatic.  Eyes:     Conjunctiva/sclera: Conjunctivae normal.  Cardiovascular:     Rate and Rhythm: Normal rate and regular rhythm.     Heart sounds: No murmur heard.   Pulmonary:     Effort: Pulmonary effort is normal. No respiratory distress.     Breath sounds: Normal breath sounds.  Abdominal:     Palpations: Abdomen is soft.     Tenderness: There is abdominal tenderness in the left lower quadrant.  Genitourinary:    Comments: Foley catheter is in place Musculoskeletal:     Cervical back: Neck supple.  Skin:    General: Skin is warm and dry.  Neurological:     Mental Status: She is alert.     ED Results / Procedures / Treatments   Labs (all labs ordered are listed, but only abnormal results are displayed) Labs Reviewed  CBC WITH DIFFERENTIAL/PLATELET - Abnormal; Notable for the following components:      Result Value   RBC 3.64 (*)    Hemoglobin 10.3 (*)    HCT 31.6 (*)    All other components within normal limits  COMPREHENSIVE METABOLIC PANEL - Abnormal; Notable for the following components:   Glucose, Bld 184 (*)    BUN 36 (*)    Creatinine, Ser 1.20 (*)    AST 14 (*)    GFR, Estimated 50 (*)    All other components within normal limits  URINALYSIS, ROUTINE W REFLEX MICROSCOPIC - Abnormal; Notable for the following components:   Color, Urine STRAW (*)    APPearance HAZY (*)    Leukocytes,Ua LARGE (*)    Bacteria, UA MANY (*)    All other components within normal limits  URINE CULTURE  LIPASE, BLOOD    EKG None  Radiology No results found.  Procedures Procedures   Medications Ordered in ED Medications - No data to display  ED Course  I have reviewed the triage vital signs  and the nursing notes.  Pertinent labs & imaging results that were available during my care of the patient were reviewed by me and considered in my medical decision making (see chart for details).    MDM Rules/Calculators/A&P                         Julia Shea is 65 years old.  She has a variety of chronic medical conditions and is a resident of a skilled rehab facility.  She presents with 1 week of nausea, vomiting, and lower abdominal pain.  The patient was evaluated for evidence of intra-abdominal pathology such as diverticulitis, intestinal obstruction, intestinal perforation.  Ultimately she was diagnosed with a UTI.  She did improve symptomatically and was deemed suitable for discharge back to her facility. Final Clinical Impression(s) / ED Diagnoses Final diagnoses:  Generalized abdominal pain  Urinary tract infection associated with indwelling urethral catheter, initial encounter Julia Shea [facility])    Rx / DC Orders ED Discharge Orders    None       Arnaldo Natal, MD 12/21/20 [phone]

## 2020-12-20 NOTE — ED Provider Notes (Signed)
1600 Care of the patient assumed at the change of shift pending UA.   7:40 PM Significant delay in obtaining urine specimen from her indwelling catheter, but UA does show signs of infection. Given her abdominal discomfort will treat for UTI pending culture result. She reports she is feeling some better and is ready to go back to her [facility]F. Rx for Macrobid, return to the ED for any other concerns.    Truddie Hidden, MD 12/20/20 [phone]

## 2020-12-22 LAB — URINE CULTURE

## 2021-05-16 ENCOUNTER — Ambulatory Visit: Payer: Medicare Other | Admitting: Orthopedic Surgery

## 2021-06-17 ENCOUNTER — Emergency Department (HOSPITAL_COMMUNITY): Payer: Medicare Other

## 2021-06-17 ENCOUNTER — Other Ambulatory Visit: Payer: Self-pay

## 2021-06-17 ENCOUNTER — Encounter (HOSPITAL_COMMUNITY): Payer: Self-pay | Admitting: Emergency Medicine

## 2021-06-17 ENCOUNTER — Inpatient Hospital Stay (HOSPITAL_COMMUNITY)
Admission: EM | Admit: 2021-06-17 | Discharge: 2021-06-21 | DRG: 698 | Disposition: A | Discharge status: Hospice | Payer: Medicare Other | Attending: Internal Medicine | Admitting: Internal Medicine

## 2021-06-17 DIAGNOSIS — Z79899 Other long term (current) drug therapy: Secondary | ICD-10-CM

## 2021-06-17 DIAGNOSIS — U071 COVID-19: Secondary | ICD-10-CM | POA: Diagnosis present

## 2021-06-17 DIAGNOSIS — E1122 Type 2 diabetes mellitus with diabetic chronic kidney disease: Secondary | ICD-10-CM | POA: Diagnosis present

## 2021-06-17 DIAGNOSIS — L02416 Cutaneous abscess of left lower limb: Secondary | ICD-10-CM | POA: Diagnosis present

## 2021-06-17 DIAGNOSIS — E860 Dehydration: Secondary | ICD-10-CM | POA: Diagnosis present

## 2021-06-17 DIAGNOSIS — Z9641 Presence of insulin pump (external) (internal): Secondary | ICD-10-CM | POA: Diagnosis present

## 2021-06-17 DIAGNOSIS — N390 Urinary tract infection, site not specified: Secondary | ICD-10-CM | POA: Diagnosis present

## 2021-06-17 DIAGNOSIS — E1165 Type 2 diabetes mellitus with hyperglycemia: Secondary | ICD-10-CM | POA: Diagnosis present

## 2021-06-17 DIAGNOSIS — J45909 Unspecified asthma, uncomplicated: Secondary | ICD-10-CM | POA: Diagnosis present

## 2021-06-17 DIAGNOSIS — Z87440 Personal history of urinary (tract) infections: Secondary | ICD-10-CM

## 2021-06-17 DIAGNOSIS — Z66 Do not resuscitate: Secondary | ICD-10-CM | POA: Diagnosis present

## 2021-06-17 DIAGNOSIS — A419 Sepsis, unspecified organism: Secondary | ICD-10-CM | POA: Diagnosis present

## 2021-06-17 DIAGNOSIS — Z978 Presence of other specified devices: Secondary | ICD-10-CM

## 2021-06-17 DIAGNOSIS — T83518A Infection and inflammatory reaction due to other urinary catheter, initial encounter: Secondary | ICD-10-CM | POA: Diagnosis not present

## 2021-06-17 DIAGNOSIS — H547 Unspecified visual loss: Secondary | ICD-10-CM | POA: Diagnosis present

## 2021-06-17 DIAGNOSIS — Z794 Long term (current) use of insulin: Secondary | ICD-10-CM

## 2021-06-17 DIAGNOSIS — F32A Depression, unspecified: Secondary | ICD-10-CM | POA: Diagnosis present

## 2021-06-17 DIAGNOSIS — D638 Anemia in other chronic diseases classified elsewhere: Secondary | ICD-10-CM | POA: Diagnosis present

## 2021-06-17 DIAGNOSIS — M726 Necrotizing fasciitis: Secondary | ICD-10-CM | POA: Diagnosis not present

## 2021-06-17 DIAGNOSIS — Z880 Allergy status to penicillin: Secondary | ICD-10-CM

## 2021-06-17 DIAGNOSIS — Z8616 Personal history of COVID-19: Secondary | ICD-10-CM

## 2021-06-17 DIAGNOSIS — D631 Anemia in chronic kidney disease: Secondary | ICD-10-CM | POA: Diagnosis present

## 2021-06-17 DIAGNOSIS — L0291 Cutaneous abscess, unspecified: Secondary | ICD-10-CM

## 2021-06-17 DIAGNOSIS — Z882 Allergy status to sulfonamides status: Secondary | ICD-10-CM

## 2021-06-17 DIAGNOSIS — L89151 Pressure ulcer of sacral region, stage 1: Secondary | ICD-10-CM | POA: Diagnosis present

## 2021-06-17 DIAGNOSIS — Z8249 Family history of ischemic heart disease and other diseases of the circulatory system: Secondary | ICD-10-CM

## 2021-06-17 DIAGNOSIS — Z7401 Bed confinement status: Secondary | ICD-10-CM

## 2021-06-17 DIAGNOSIS — IMO0002 Reserved for concepts with insufficient information to code with codable children: Secondary | ICD-10-CM | POA: Diagnosis present

## 2021-06-17 DIAGNOSIS — Z833 Family history of diabetes mellitus: Secondary | ICD-10-CM

## 2021-06-17 DIAGNOSIS — D72829 Elevated white blood cell count, unspecified: Secondary | ICD-10-CM

## 2021-06-17 DIAGNOSIS — I129 Hypertensive chronic kidney disease with stage 1 through stage 4 chronic kidney disease, or unspecified chronic kidney disease: Secondary | ICD-10-CM | POA: Diagnosis present

## 2021-06-17 DIAGNOSIS — K769 Liver disease, unspecified: Secondary | ICD-10-CM | POA: Diagnosis present

## 2021-06-17 DIAGNOSIS — N1831 Chronic kidney disease, stage 3a: Secondary | ICD-10-CM | POA: Diagnosis present

## 2021-06-17 DIAGNOSIS — E871 Hypo-osmolality and hyponatremia: Secondary | ICD-10-CM | POA: Diagnosis present

## 2021-06-17 DIAGNOSIS — N179 Acute kidney failure, unspecified: Secondary | ICD-10-CM | POA: Diagnosis present

## 2021-06-17 DIAGNOSIS — Y846 Urinary catheterization as the cause of abnormal reaction of the patient, or of later complication, without mention of misadventure at the time of the procedure: Secondary | ICD-10-CM | POA: Diagnosis present

## 2021-06-17 DIAGNOSIS — E872 Acidosis: Secondary | ICD-10-CM | POA: Diagnosis present

## 2021-06-17 DIAGNOSIS — L02415 Cutaneous abscess of right lower limb: Secondary | ICD-10-CM | POA: Diagnosis present

## 2021-06-17 DIAGNOSIS — K219 Gastro-esophageal reflux disease without esophagitis: Secondary | ICD-10-CM | POA: Diagnosis present

## 2021-06-17 DIAGNOSIS — Z20822 Contact with and (suspected) exposure to covid-19: Secondary | ICD-10-CM | POA: Diagnosis present

## 2021-06-17 DIAGNOSIS — Z515 Encounter for palliative care: Secondary | ICD-10-CM

## 2021-06-17 LAB — CBC WITH DIFFERENTIAL/PLATELET
Abs Immature Granulocytes: 1.32 10*3/uL — ABNORMAL HIGH (ref 0.00–0.07)
Basophils Absolute: 0.1 10*3/uL (ref 0.0–0.1)
Basophils Relative: 0 %
Eosinophils Absolute: 0.1 10*3/uL (ref 0.0–0.5)
Eosinophils Relative: 0 %
HCT: 24.3 % — ABNORMAL LOW (ref 36.0–46.0)
Hemoglobin: 7.8 g/dL — ABNORMAL LOW (ref 12.0–15.0)
Immature Granulocytes: 4 %
Lymphocytes Relative: 3 %
Lymphs Abs: 0.8 10*3/uL (ref 0.7–4.0)
MCH: 26 pg (ref 26.0–34.0)
MCHC: 32.1 g/dL (ref 30.0–36.0)
MCV: 81 fL (ref 80.0–100.0)
Monocytes Absolute: 0.9 10*3/uL (ref 0.1–1.0)
Monocytes Relative: 3 %
Neutro Abs: 29.8 10*3/uL — ABNORMAL HIGH (ref 1.7–7.7)
Neutrophils Relative %: 90 %
Platelets: 491 10*3/uL — ABNORMAL HIGH (ref 150–400)
RBC: 3 MIL/uL — ABNORMAL LOW (ref 3.87–5.11)
RDW: 15.6 % — ABNORMAL HIGH (ref 11.5–15.5)
WBC: 33.1 10*3/uL — ABNORMAL HIGH (ref 4.0–10.5)
nRBC: 0 % (ref 0.0–0.2)

## 2021-06-17 LAB — BASIC METABOLIC PANEL
Anion gap: 9 (ref 5–15)
BUN: 83 mg/dL — ABNORMAL HIGH (ref 8–23)
CO2: 18 mmol/L — ABNORMAL LOW (ref 22–32)
Calcium: 7.7 mg/dL — ABNORMAL LOW (ref 8.9–10.3)
Chloride: 105 mmol/L (ref 98–111)
Creatinine, Ser: 1.61 mg/dL — ABNORMAL HIGH (ref 0.44–1.00)
GFR, Estimated: 35 mL/min — ABNORMAL LOW (ref >60)
Glucose, Bld: 90 mg/dL (ref 70–99)
Potassium: 4 mmol/L (ref 3.5–5.1)
Sodium: 132 mmol/L — ABNORMAL LOW (ref 135–145)

## 2021-06-17 LAB — URINALYSIS, ROUTINE W REFLEX MICROSCOPIC
Bilirubin Urine: NEGATIVE
Glucose, UA: NEGATIVE mg/dL
Ketones, ur: NEGATIVE mg/dL
Nitrite: NEGATIVE
Protein, ur: 100 mg/dL — AB
Specific Gravity, Urine: 1.018 (ref 1.005–1.030)
WBC, UA: >50 WBC/hpf — ABNORMAL HIGH (ref 0–5)
pH: 8 (ref 5.0–8.0)

## 2021-06-17 LAB — LACTIC ACID, PLASMA
Lactic Acid, Venous: 1.3 mmol/L (ref 0.5–1.9)
Lactic Acid, Venous: 1.7 mmol/L (ref 0.5–1.9)

## 2021-06-17 MED ORDER — ONDANSETRON 4 MG PO TBDP: 4.0000 mg | ORAL_TABLET | Freq: Four times a day (QID) | ORAL | Status: DC | PRN

## 2021-06-17 MED ORDER — MORPHINE SULFATE (PF) 2 MG/ML IV SOLN
2.0000 mg | INTRAVENOUS | Status: DC | PRN
  Administered 2021-06-18: 2 mg via INTRAVENOUS
  Administered 2021-06-18: 4 mg via INTRAVENOUS
  Filled 2021-06-17: qty 1
  Filled 2021-06-17: qty 2

## 2021-06-17 MED ORDER — CLINDAMYCIN PHOSPHATE 600 MG/50ML IV SOLN
600.0000 mg | Freq: Three times a day (TID) | INTRAVENOUS | Status: DC
  Administered 2021-06-18 – 2021-06-19 (×5): 600 mg via INTRAVENOUS
  Filled 2021-06-17 (×6): qty 50

## 2021-06-17 MED ORDER — LEVOFLOXACIN IN D5W 750 MG/150ML IV SOLN
750.0000 mg | Freq: Once | INTRAVENOUS | Status: AC
  Administered 2021-06-17: 750 mg via INTRAVENOUS
  Filled 2021-06-17: qty 150

## 2021-06-17 MED ORDER — HALOPERIDOL LACTATE 2 MG/ML PO CONC
0.5000 mg | ORAL | Status: DC | PRN
  Filled 2021-06-17: qty 0.3

## 2021-06-17 MED ORDER — HALOPERIDOL 0.5 MG PO TABS
0.5000 mg | ORAL_TABLET | ORAL | Status: DC | PRN
  Filled 2021-06-17: qty 1

## 2021-06-17 MED ORDER — BIOTENE DRY MOUTH MT LIQD: 15.0000 mL | OROMUCOSAL | Status: DC | PRN

## 2021-06-17 MED ORDER — GLYCOPYRROLATE 1 MG PO TABS
1.0000 mg | ORAL_TABLET | ORAL | Status: DC | PRN
  Filled 2021-06-17: qty 1

## 2021-06-17 MED ORDER — HALOPERIDOL LACTATE 5 MG/ML IJ SOLN: 0.5000 mg | INTRAMUSCULAR | Status: DC | PRN

## 2021-06-17 MED ORDER — SODIUM CHLORIDE 0.9 % IV SOLN: INTRAVENOUS | Status: DC

## 2021-06-17 MED ORDER — GLYCOPYRROLATE 0.2 MG/ML IJ SOLN: 0.2000 mg | INTRAMUSCULAR | Status: DC | PRN

## 2021-06-17 MED ORDER — VANCOMYCIN HCL 1500 MG/300ML IV SOLN
1500.0000 mg | Freq: Once | INTRAVENOUS | Status: AC
  Administered 2021-06-18: 1500 mg via INTRAVENOUS
  Filled 2021-06-17: qty 300

## 2021-06-17 MED ORDER — IOHEXOL 350 MG/ML SOLN
80.0000 mL | Freq: Once | INTRAVENOUS | Status: AC | PRN
  Administered 2021-06-17: 80 mL via INTRAVENOUS

## 2021-06-17 MED ORDER — SODIUM CHLORIDE 0.9 % IV SOLN
1.0000 g | Freq: Once | INTRAVENOUS | Status: AC
  Administered 2021-06-18: 1 g via INTRAVENOUS
  Filled 2021-06-17: qty 1

## 2021-06-17 MED ORDER — ACETAMINOPHEN 325 MG PO TABS: 650.0000 mg | ORAL_TABLET | Freq: Four times a day (QID) | ORAL | Status: DC | PRN

## 2021-06-17 MED ORDER — POLYVINYL ALCOHOL 1.4 % OP SOLN: 1.0000 [drp] | Freq: Four times a day (QID) | OPHTHALMIC | Status: DC | PRN

## 2021-06-17 MED ORDER — ONDANSETRON HCL 4 MG/2ML IJ SOLN
4.0000 mg | Freq: Four times a day (QID) | INTRAMUSCULAR | Status: DC | PRN
  Administered 2021-06-18 – 2021-06-21 (×5): 4 mg via INTRAVENOUS
  Filled 2021-06-17 (×5): qty 2

## 2021-06-17 MED ORDER — SODIUM CHLORIDE 0.9 % IV BOLUS
1000.0000 mL | Freq: Once | INTRAVENOUS | Status: AC
  Administered 2021-06-17: 1000 mL via INTRAVENOUS

## 2021-06-17 MED ORDER — INSULIN ASPART 100 UNIT/ML IJ SOLN
0.0000 [IU] | INTRAMUSCULAR | Status: DC
  Administered 2021-06-18: 3 [IU] via SUBCUTANEOUS
  Administered 2021-06-20: 1 [IU] via SUBCUTANEOUS
  Administered 2021-06-20: 3 [IU] via SUBCUTANEOUS
  Administered 2021-06-21 (×2): 1 [IU] via SUBCUTANEOUS
  Filled 2021-06-17: qty 0.09

## 2021-06-17 MED ORDER — ACETAMINOPHEN 650 MG RE SUPP: 650.0000 mg | Freq: Four times a day (QID) | RECTAL | Status: DC | PRN

## 2021-06-17 NOTE — ED Provider Notes (Signed)
[facility] DEPT Provider Note   CSN: [id number] Arrival date & time: 06/17/21  1824     History Chief Complaint  Patient presents with   Urinary Retention   Tailbone Pain   Abnormal Lab    Julia Shea is a 65 y.o. female.  Patient presents to ER chief complaint of low blood pressure, abnormal labs and concern for urinary tract infection.  She presents from a nursing home.  She denies any fevers or cough.  Denies abdominal pain.  Denies headache or chest pain.  Symptoms ongoing for the past 2 days.      Past Medical History:  Diagnosis Date   Anemia    Asthma    no inhalers   Bilateral hip pain    Bladder problem    chronic in-dwelling Foley after developing "a hole in my bladder", reportedly in 2018   Cataract    bilateral, clinical blind   Chronic indwelling Foley catheter    Closed comminuted left humeral fracture    ORIF in June 2017   Depression    Diabetes mellitus type 2, with complication, on long term insulin pump ([place])    Falls frequently    none since 03/2020   GERD (gastroesophageal reflux disease)    Hypertension    MRSA infection    Nausea & vomiting    Rotator cuff tear arthropathy, left     Patient Active Problem List   Diagnosis Date Noted   Occult blood in stools 03/27/2020   Anemia 03/27/2020   Elevated alkaline phosphatase level 03/27/2020   Abdominal discomfort in left lower quadrant 03/27/2020   Diarrhea    Hip pain    Anemia of chronic disease    Left groin pain    Cavitary lung disease    Hypotension    Hyponatremia 08/21/2019   Acute lower UTI 08/21/2019   AKI (acute kidney injury) ([place]) 08/21/2019   Severe malnutrition ([place]) 03/31/2018   Hypokalemia 03/29/2018   Diabetes mellitus type 2, with complication, on long term insulin pump ([facility])    Nausea & vomiting    Chronic indwelling Foley catheter    Bladder problem    Rotator cuff tear arthropathy, left    Malnutrition of moderate degree  01/24/2018   Pressure injury of skin 12/22/2017   Hepatic steatosis 05/15/2013   Abdominal pain, right upper quadrant 05/07/2013   Breast cancer screening 11/14/2011   Hypertriglyceridemia 10/30/2011   Depression 10/26/2011   Diabetes mellitus type 2, uncontrolled ([place]) 10/26/2011   Hypertension 10/26/2011    Past Surgical History:  Procedure Laterality Date   BIOPSY  11/24/2020   Procedure: BIOPSY;  Surgeon: Irving Copas., MD;  Location: [facility] ENDOSCOPY;  Service: Gastroenterology;;   COLONOSCOPY WITH PROPOFOL N/A 11/24/2020   Procedure: COLONOSCOPY WITH PROPOFOL;  Surgeon: Irving Copas., MD;  Location: [facility] ENDOSCOPY;  Service: Gastroenterology;  Laterality: N/A;   ESOPHAGOGASTRODUODENOSCOPY (EGD) WITH PROPOFOL N/A 11/24/2020   Procedure: ESOPHAGOGASTRODUODENOSCOPY (EGD) WITH PROPOFOL;  Surgeon: Rush Landmark Telford Nab., MD;  Location: [facility];  Service: Gastroenterology;  Laterality: N/A;   EYE SURGERY Bilateral    cataracts   IMPACTION REMOVAL  11/24/2020   Procedure: IMPACTION REMOVAL;  Surgeon: Rush Landmark Telford Nab., MD;  Location: [facility];  Service: Gastroenterology;;   LAPAROSCOPIC OOPHORECTOMY     removal of cyst   POLYPECTOMY  11/24/2020   Procedure: POLYPECTOMY;  Surgeon: Rush Landmark Telford Nab., MD;  Location: [facility];  Service: Gastroenterology;;   TEE WITHOUT CARDIOVERSION N/A 09/07/2019  Procedure: TRANSESOPHAGEAL ECHOCARDIOGRAM (TEE);  Surgeon: Teodoro Spray, MD;  Location: [facility] ORS;  Service: Cardiovascular;  Laterality: N/A;     OB History     Gravida  0   Para  0   Term  0   Preterm  0   AB  0   Living  0      SAB  0   IAB  0   Ectopic  0   Multiple  0   Live Births  0           Family History  Problem Relation Age of Onset   Heart attack Mother    Heart attack Father    Diabetes Maternal Uncle    Colon cancer Neg Hx    Esophageal cancer Neg Hx    Inflammatory bowel disease Neg Hx    Liver disease Neg  Hx    Pancreatic cancer Neg Hx    Rectal cancer Neg Hx    Stomach cancer Neg Hx     Social History   Tobacco Use   Smoking status: Never   Smokeless tobacco: Never  Vaping Use   Vaping Use: Never used  Substance Use Topics   Alcohol use: No   Drug use: No    Home Medications Prior to Admission medications   Medication Sig Start Date End Date Taking? Authorizing Provider  acetaminophen (TYLENOL) 325 MG tablet Take 650 mg by mouth every 8 (eight) hours as needed for moderate pain.    [provider]  escitalopram (LEXAPRO) 10 MG tablet Take 10 mg by mouth daily. Take with '5mg'$  tablet to equal '15mg'$  once daily    [provider]  escitalopram (LEXAPRO) 5 MG tablet Take 5 mg by mouth daily. Take with '10mg'$  tablet to equal '15mg'$  once daily    [provider]  ferrous sulfate 325 (65 FE) MG tablet Take 1 tablet (325 mg total) by mouth daily with breakfast. Patient taking differently: Take 325 mg by mouth 2 (two) times daily with a meal. 09/09/19   Nicole Kindred A, DO  gabapentin (NEURONTIN) 100 MG capsule Take 100 mg by mouth 2 (two) times daily.    [provider]  insulin aspart (NOVOLOG) 100 UNIT/ML injection Inject 2-10 Units into the skin 3 (three) times daily with meals. If blood sugar is 151-200= 2 units, 201-250= 4 units, 251-300= 6 units, 301-350= 8 units, 351-400= 10 units. If blood sugar is greater than 400, call MD.    [provider]  Insulin Glargine (BASAGLAR KWIKPEN) 100 UNIT/ML Inject 15 Units into the skin daily.    [provider]  Lidocaine (SALONPAS PAIN RELIEVING) 4 % PTCH Apply 1 patch topically See admin instructions. Apply to right shoulder for 12 hours, then remove    [provider]  liver oil-zinc oxide (DESITIN) 40 % ointment Apply topically 3 (three) times daily. Patient taking differently: Apply 1 application topically 3 (three) times daily. Apply to perianal area 09/08/19   Nicole Kindred A, DO   magnesium oxide (MAG-OX) 400 (241.3 Mg) MG tablet Take 1 tablet (400 mg total) by mouth daily. 09/09/19   Nicole Kindred A, DO  mirabegron ER (MYRBETRIQ) 25 MG TB24 tablet Take 25 mg by mouth every evening.    [provider]  Multiple Vitamins-Minerals (MULTIVITAMIN WITH MINERALS) tablet Take 1 tablet by mouth daily. 02/06/18   Tukov-Yual, Arlyss Gandy, NP  nitrofurantoin, macrocrystal-monohydrate, (MACROBID) 100 MG capsule Take 1 capsule (100 mg total) by mouth 2 (  two) times daily. 12/20/20   Truddie Hidden, MD  ondansetron (ZOFRAN-ODT) 4 MG disintegrating tablet Take 4 mg by mouth 4 (four) times daily as needed for nausea or vomiting.    [provider]  pantoprazole (PROTONIX) 40 MG tablet Take 40 mg by mouth 2 (two) times daily.    [provider]  potassium chloride (KLOR-CON) 10 MEQ tablet Take 1 tablet (10 mEq total) by mouth daily. 09/08/19   Ezekiel Slocumb, DO  traZODone (DESYREL) 50 MG tablet Take 50 mg by mouth at bedtime.    [provider]    Allergies    Penicillins and Sulfa antibiotics  Review of Systems   Review of Systems  Constitutional:  Negative for fever.  HENT:  Negative for ear pain.   Eyes:  Negative for pain.  Respiratory:  Negative for cough.   Cardiovascular:  Negative for chest pain.  Gastrointestinal:  Negative for abdominal pain.  Genitourinary:  Negative for flank pain.  Musculoskeletal:  Negative for back pain.  Skin:  Negative for rash.  Neurological:  Negative for headaches.   Physical Exam Updated Vital Signs BP (!) 106/51   Pulse 90   Temp 98.3 F (36.8 C) (Oral)   Resp 17   SpO2 99%   Physical Exam Constitutional:      General: She is not in acute distress.    Appearance: Normal appearance.  HENT:     Head: Normocephalic.     Nose: Nose normal.  Eyes:     Extraocular Movements: Extraocular movements intact.  Cardiovascular:     Rate and Rhythm: Normal rate.  Pulmonary:     Effort: Pulmonary  effort is normal.  Musculoskeletal:        General: Normal range of motion.     Cervical back: Normal range of motion.  Neurological:     General: No focal deficit present.     Mental Status: She is alert. Mental status is at baseline.    ED Results / Procedures / Treatments   Labs (all labs ordered are listed, but only abnormal results are displayed) Labs Reviewed  CBC WITH DIFFERENTIAL/PLATELET - Abnormal; Notable for the following components:      Result Value   WBC 33.1 (*)    RBC 3.00 (*)    Hemoglobin 7.8 (*)    HCT 24.3 (*)    RDW 15.6 (*)    Platelets 491 (*)    Neutro Abs 29.8 (*)    Abs Immature Granulocytes 1.32 (*)    All other components within normal limits  BASIC METABOLIC PANEL - Abnormal; Notable for the following components:   Sodium 132 (*)    CO2 18 (*)    BUN 83 (*)    Creatinine, Ser 1.61 (*)    Calcium 7.7 (*)    GFR, Estimated 35 (*)    All other components within normal limits  URINALYSIS, ROUTINE W REFLEX MICROSCOPIC - Abnormal; Notable for the following components:   Color, Urine AMBER (*)    APPearance CLOUDY (*)    Hgb urine dipstick SMALL (*)    Protein, ur 100 (*)    Leukocytes,Ua LARGE (*)    WBC, UA >50 (*)    Bacteria, UA MANY (*)    All other components within normal limits  CULTURE, BLOOD (ROUTINE X 2)  CULTURE, BLOOD (ROUTINE X 2)  URINE CULTURE  LACTIC ACID, PLASMA  LACTIC ACID, PLASMA    EKG None  Radiology CT Abdomen Pelvis  W Contrast  Result Date: 06/17/2021 CLINICAL DATA:  Abdominal pain, decreased urine output EXAM: CT ABDOMEN AND PELVIS WITH CONTRAST TECHNIQUE: Multidetector CT imaging of the abdomen and pelvis was performed using the standard protocol following bolus administration of intravenous contrast. CONTRAST:  15m OMNIPAQUE IOHEXOL 350 MG/ML SOLN COMPARISON:  02/19/2021 FINDINGS: Lower chest: No acute abnormality. Hepatobiliary: No focal liver abnormality is seen. No gallstones, gallbladder wall thickening, or  biliary dilatation. The gallbladder is mildly distended, with flattening of the fundus. Pancreas: Unremarkable. No pancreatic ductal dilatation or surrounding inflammatory changes. Spleen: Normal in size without focal abnormality. Adrenals/Urinary Tract: The adrenal glands are unremarkable. The kidneys enhance symmetrically with no hydronephrosis. Subcentimeter hypoattenuating lesions, too small to characterize but most likely renal cysts. Enhancement of the right renal pelvis and proximal to mid right ureter. The bladder appears thickened and contains a Foley. Stomach/Bowel: The stomach is normal in appearance. No evidence of bowel wall thickening, distention, or obstruction. The appendix is unremarkable. Vascular/Lymphatic: Aortic atherosclerosis. No enlarged abdominal or pelvic lymph nodes. Prominent right external iliac chain lymph node (series 71 from image 96), measuring up to 6 mm in short axis, likely reactive. Reproductive: The uterus is present.  No adnexal mass. Other: No free fluid or free air in the abdomen or pelvis. Musculoskeletal: No acute osseous abnormality.  Osteopenia. IMPRESSION: Enhancement of the right renal pelvis and proximal to mid right ureter, without decreased enhancement in the right kidney, concerning for ascending ureteral infection in the setting of known urinary tract infection, without current evidence of pyelonephritis. Electronically Signed   By: AMerilyn BabaM.D.   On: 06/17/2021 22:37   DG Chest Port 1 View  Result Date: 06/17/2021 CLINICAL DATA:  Leukocytosis.  UTI.  COVID positive for 5 days ago. EXAM: PORTABLE CHEST 1 VIEW.  Patient is markedly rotated COMPARISON:  Chest x-ray 11/12/2019, CT chest 08/30/2019 FINDINGS: The heart and mediastinal contours are limited due to patient rotation. Aortic calcification. No definite focal consolidation. Linear atelectasis versus scarring within the right lung. No pulmonary edema. No pleural effusion. No pneumothorax. No acute  osseous abnormality. IMPRESSION: No definite acute cardiopulmonary abnormality with limited evaluation due to marked patient rotation. Recommend repeat chest x-ray. Electronically Signed   By: MIven FinnM.D.   On: 06/17/2021 22:00    Procedures Procedures   Medications Ordered in ED Medications  levofloxacin (LEVAQUIN) IVPB 750 mg (has no administration in time range)  sodium chloride 0.9 % bolus 1,000 mL (1,000 mLs Intravenous New Bag/Given 06/17/21 1952)  iohexol (OMNIPAQUE) 350 MG/ML injection 80 mL (80 mLs Intravenous Contrast Given 06/17/21 2200)    ED Course  I have reviewed the triage vital signs and the nursing notes.  Pertinent labs & imaging results that were available during my care of the patient were reviewed by me and considered in my medical decision making (see chart for details).    MDM Rules/Calculators/A&P                           Blood pressure appears to have improved with IV fluid hydration and appears normal vital signs at this time.  Patient appears comfortable with no complaints of any pain.  Labs show elevated white count greater than 30, lactic acid however normal.  CT abdomen pelvis pursued concerning for signs of urinary tract infection.  Patient is dependent on a chronic Foley catheter but given these findings urinalysis was sent.  Patient treated empirically for UTI  with IV Levaquin.  Will be admitted to the hospitalist team.  Final Clinical Impression(s) / ED Diagnoses Final diagnoses:  Leukocytosis, unspecified type  Urinary tract infection without hematuria, site unspecified    Rx / DC Orders ED Discharge Orders     None        Luna Fuse, MD 06/17/21 2244

## 2021-06-17 NOTE — ED Triage Notes (Signed)
Patient presents from [facility] with complaints of decreased urine output through her foley catheter, elevated BUN and WBC and coccyx pain. UTI was confirmed. She also tested positive for Covid 19 4-5 days ago.   EMS vitals: 66/42 (initial) BP-> 117/60 (post 1 L saline bolus) BP  88 HR 18 RR 79 CBG

## 2021-06-18 DIAGNOSIS — A419 Sepsis, unspecified organism: Secondary | ICD-10-CM

## 2021-06-18 DIAGNOSIS — E1165 Type 2 diabetes mellitus with hyperglycemia: Secondary | ICD-10-CM

## 2021-06-18 DIAGNOSIS — Z66 Do not resuscitate: Secondary | ICD-10-CM | POA: Diagnosis present

## 2021-06-18 DIAGNOSIS — Z880 Allergy status to penicillin: Secondary | ICD-10-CM | POA: Diagnosis not present

## 2021-06-18 DIAGNOSIS — U071 COVID-19: Secondary | ICD-10-CM

## 2021-06-18 DIAGNOSIS — Y846 Urinary catheterization as the cause of abnormal reaction of the patient, or of later complication, without mention of misadventure at the time of the procedure: Secondary | ICD-10-CM | POA: Diagnosis present

## 2021-06-18 DIAGNOSIS — F32A Depression, unspecified: Secondary | ICD-10-CM | POA: Diagnosis present

## 2021-06-18 DIAGNOSIS — N390 Urinary tract infection, site not specified: Secondary | ICD-10-CM

## 2021-06-18 DIAGNOSIS — E1122 Type 2 diabetes mellitus with diabetic chronic kidney disease: Secondary | ICD-10-CM | POA: Diagnosis present

## 2021-06-18 DIAGNOSIS — K219 Gastro-esophageal reflux disease without esophagitis: Secondary | ICD-10-CM | POA: Diagnosis present

## 2021-06-18 DIAGNOSIS — T83518A Infection and inflammatory reaction due to other urinary catheter, initial encounter: Secondary | ICD-10-CM | POA: Diagnosis present

## 2021-06-18 DIAGNOSIS — J45909 Unspecified asthma, uncomplicated: Secondary | ICD-10-CM | POA: Diagnosis present

## 2021-06-18 DIAGNOSIS — D638 Anemia in other chronic diseases classified elsewhere: Secondary | ICD-10-CM

## 2021-06-18 DIAGNOSIS — D72829 Elevated white blood cell count, unspecified: Secondary | ICD-10-CM

## 2021-06-18 DIAGNOSIS — E872 Acidosis: Secondary | ICD-10-CM | POA: Diagnosis present

## 2021-06-18 DIAGNOSIS — N179 Acute kidney failure, unspecified: Secondary | ICD-10-CM | POA: Diagnosis present

## 2021-06-18 DIAGNOSIS — I129 Hypertensive chronic kidney disease with stage 1 through stage 4 chronic kidney disease, or unspecified chronic kidney disease: Secondary | ICD-10-CM | POA: Diagnosis present

## 2021-06-18 DIAGNOSIS — E871 Hypo-osmolality and hyponatremia: Secondary | ICD-10-CM

## 2021-06-18 DIAGNOSIS — M726 Necrotizing fasciitis: Secondary | ICD-10-CM | POA: Diagnosis present

## 2021-06-18 DIAGNOSIS — K769 Liver disease, unspecified: Secondary | ICD-10-CM | POA: Diagnosis present

## 2021-06-18 DIAGNOSIS — L02416 Cutaneous abscess of left lower limb: Secondary | ICD-10-CM | POA: Diagnosis present

## 2021-06-18 DIAGNOSIS — L02415 Cutaneous abscess of right lower limb: Secondary | ICD-10-CM | POA: Diagnosis present

## 2021-06-18 DIAGNOSIS — Z978 Presence of other specified devices: Secondary | ICD-10-CM

## 2021-06-18 DIAGNOSIS — Z8616 Personal history of COVID-19: Secondary | ICD-10-CM | POA: Diagnosis not present

## 2021-06-18 DIAGNOSIS — D631 Anemia in chronic kidney disease: Secondary | ICD-10-CM | POA: Diagnosis present

## 2021-06-18 DIAGNOSIS — Z882 Allergy status to sulfonamides status: Secondary | ICD-10-CM | POA: Diagnosis not present

## 2021-06-18 DIAGNOSIS — N1831 Chronic kidney disease, stage 3a: Secondary | ICD-10-CM | POA: Diagnosis present

## 2021-06-18 DIAGNOSIS — R6520 Severe sepsis without septic shock: Secondary | ICD-10-CM

## 2021-06-18 DIAGNOSIS — Z515 Encounter for palliative care: Secondary | ICD-10-CM | POA: Diagnosis not present

## 2021-06-18 DIAGNOSIS — L89151 Pressure ulcer of sacral region, stage 1: Secondary | ICD-10-CM | POA: Diagnosis present

## 2021-06-18 DIAGNOSIS — Z20822 Contact with and (suspected) exposure to covid-19: Secondary | ICD-10-CM | POA: Diagnosis present

## 2021-06-18 LAB — CBC WITH DIFFERENTIAL/PLATELET
Abs Immature Granulocytes: 0.94 10*3/uL — ABNORMAL HIGH (ref 0.00–0.07)
Basophils Absolute: 0.1 10*3/uL (ref 0.0–0.1)
Basophils Relative: 0 %
Eosinophils Absolute: 0 10*3/uL (ref 0.0–0.5)
Eosinophils Relative: 0 %
HCT: 20.5 % — ABNORMAL LOW (ref 36.0–46.0)
Hemoglobin: 6.5 g/dL — CL (ref 12.0–15.0)
Immature Granulocytes: 3 %
Lymphocytes Relative: 2 %
Lymphs Abs: 0.7 10*3/uL (ref 0.7–4.0)
MCH: 26 pg (ref 26.0–34.0)
MCHC: 31.7 g/dL (ref 30.0–36.0)
MCV: 82 fL (ref 80.0–100.0)
Monocytes Absolute: 0.7 10*3/uL (ref 0.1–1.0)
Monocytes Relative: 2 %
Neutro Abs: 26 10*3/uL — ABNORMAL HIGH (ref 1.7–7.7)
Neutrophils Relative %: 93 %
Platelets: 426 10*3/uL — ABNORMAL HIGH (ref 150–400)
RBC: 2.5 MIL/uL — ABNORMAL LOW (ref 3.87–5.11)
RDW: 15.5 % (ref 11.5–15.5)
WBC: 28.4 10*3/uL — ABNORMAL HIGH (ref 4.0–10.5)
nRBC: 0 % (ref 0.0–0.2)

## 2021-06-18 LAB — COMPREHENSIVE METABOLIC PANEL
ALT: 11 U/L (ref 0–44)
AST: 15 U/L (ref 15–41)
Albumin: 1.6 g/dL — ABNORMAL LOW (ref 3.5–5.0)
Alkaline Phosphatase: 151 U/L — ABNORMAL HIGH (ref 38–126)
Anion gap: 11 (ref 5–15)
BUN: 72 mg/dL — ABNORMAL HIGH (ref 8–23)
CO2: 16 mmol/L — ABNORMAL LOW (ref 22–32)
Calcium: 6.6 mg/dL — ABNORMAL LOW (ref 8.9–10.3)
Chloride: 105 mmol/L (ref 98–111)
Creatinine, Ser: 1.42 mg/dL — ABNORMAL HIGH (ref 0.44–1.00)
GFR, Estimated: 41 mL/min — ABNORMAL LOW (ref >60)
Glucose, Bld: 104 mg/dL — ABNORMAL HIGH (ref 70–99)
Potassium: 3.5 mmol/L (ref 3.5–5.1)
Sodium: 132 mmol/L — ABNORMAL LOW (ref 135–145)
Total Bilirubin: 1.1 mg/dL (ref 0.3–1.2)
Total Protein: 4.7 g/dL — ABNORMAL LOW (ref 6.5–8.1)

## 2021-06-18 LAB — TROPONIN I (HIGH SENSITIVITY)
Troponin I (High Sensitivity): 10 ng/L (ref <18)
Troponin I (High Sensitivity): 9 ng/L (ref <18)

## 2021-06-18 LAB — HIV ANTIBODY (ROUTINE TESTING W REFLEX): HIV Screen 4th Generation wRfx: NONREACTIVE

## 2021-06-18 LAB — CBG MONITORING, ED
Glucose-Capillary: 117 mg/dL — ABNORMAL HIGH (ref 70–99)
Glucose-Capillary: 207 mg/dL — ABNORMAL HIGH (ref 70–99)
Glucose-Capillary: 98 mg/dL (ref 70–99)
Glucose-Capillary: 99 mg/dL (ref 70–99)

## 2021-06-18 LAB — RESP PANEL BY RT-PCR (FLU A&B, COVID) ARPGX2
Influenza A by PCR: NEGATIVE
Influenza B by PCR: NEGATIVE
SARS Coronavirus 2 by RT PCR: POSITIVE — AB

## 2021-06-18 LAB — PROCALCITONIN: Procalcitonin: 5.25 ng/mL

## 2021-06-18 LAB — GLUCOSE, CAPILLARY
Glucose-Capillary: 83 mg/dL (ref 70–99)
Glucose-Capillary: 85 mg/dL (ref 70–99)

## 2021-06-18 LAB — C-REACTIVE PROTEIN: CRP: 25.6 mg/dL — ABNORMAL HIGH (ref <1.0)

## 2021-06-18 LAB — CK: Total CK: 18 U/L — ABNORMAL LOW (ref 38–234)

## 2021-06-18 LAB — FERRITIN: Ferritin: 838 ng/mL — ABNORMAL HIGH (ref 11–307)

## 2021-06-18 LAB — PREPARE RBC (CROSSMATCH)

## 2021-06-18 LAB — FIBRINOGEN: Fibrinogen: 677 mg/dL — ABNORMAL HIGH (ref 210–475)

## 2021-06-18 LAB — D-DIMER, QUANTITATIVE: D-Dimer, Quant: 2.25 ug/mL-FEU — ABNORMAL HIGH (ref 0.00–0.50)

## 2021-06-18 LAB — LACTATE DEHYDROGENASE: LDH: 87 U/L — ABNORMAL LOW (ref 98–192)

## 2021-06-18 MED ORDER — MIRABEGRON ER 25 MG PO TB24
25.0000 mg | ORAL_TABLET | Freq: Every evening | ORAL | Status: DC
  Administered 2021-06-18: 25 mg via ORAL
  Filled 2021-06-18 (×2): qty 1

## 2021-06-18 MED ORDER — INSULIN GLARGINE-YFGN 100 UNIT/ML ~~LOC~~ SOLN
15.0000 [IU] | Freq: Every day | SUBCUTANEOUS | Status: DC
  Administered 2021-06-18: 15 [IU] via SUBCUTANEOUS
  Filled 2021-06-18: qty 0.15

## 2021-06-18 MED ORDER — HYDROMORPHONE HCL 1 MG/ML IJ SOLN
1.0000 mg | INTRAMUSCULAR | Status: DC | PRN
  Administered 2021-06-18 – 2021-06-19 (×2): 1 mg via INTRAVENOUS
  Filled 2021-06-18 (×2): qty 1

## 2021-06-18 MED ORDER — ESCITALOPRAM OXALATE 20 MG PO TABS
20.0000 mg | ORAL_TABLET | Freq: Every day | ORAL | Status: DC
  Administered 2021-06-18: 20 mg via ORAL
  Filled 2021-06-18: qty 2

## 2021-06-18 MED ORDER — SODIUM CHLORIDE 0.9 % IV SOLN: INTRAVENOUS | Status: DC

## 2021-06-18 MED ORDER — FAMOTIDINE 20 MG PO TABS
20.0000 mg | ORAL_TABLET | Freq: Two times a day (BID) | ORAL | Status: DC
  Administered 2021-06-18 (×2): 20 mg via ORAL
  Filled 2021-06-18 (×2): qty 1

## 2021-06-18 MED ORDER — SODIUM CHLORIDE 0.9% IV SOLUTION: Freq: Once | INTRAVENOUS | Status: AC

## 2021-06-18 MED ORDER — HYDROCODONE-ACETAMINOPHEN 5-325 MG PO TABS: 1.0000 | ORAL_TABLET | Freq: Four times a day (QID) | ORAL | Status: DC | PRN

## 2021-06-18 MED ORDER — VANCOMYCIN HCL 1250 MG/250ML IV SOLN: 1250.0000 mg | INTRAVENOUS | Status: DC

## 2021-06-18 MED ORDER — GABAPENTIN 100 MG PO CAPS
100.0000 mg | ORAL_CAPSULE | Freq: Two times a day (BID) | ORAL | Status: DC
  Administered 2021-06-18 (×2): 100 mg via ORAL
  Filled 2021-06-18 (×2): qty 1

## 2021-06-18 MED ORDER — SODIUM CHLORIDE 0.9 % IV BOLUS
500.0000 mL | Freq: Once | INTRAVENOUS | Status: AC
  Administered 2021-06-18: 500 mL via INTRAVENOUS

## 2021-06-18 MED ORDER — PANTOPRAZOLE SODIUM 40 MG PO TBEC
40.0000 mg | DELAYED_RELEASE_TABLET | Freq: Two times a day (BID) | ORAL | Status: DC
  Filled 2021-06-18: qty 1

## 2021-06-18 MED ORDER — BASAGLAR KWIKPEN 100 UNIT/ML ~~LOC~~ SOPN: 15.0000 [IU] | PEN_INJECTOR | Freq: Every day | SUBCUTANEOUS | Status: DC

## 2021-06-18 MED ORDER — ESCITALOPRAM OXALATE 10 MG PO TABS
5.0000 mg | ORAL_TABLET | Freq: Every day | ORAL | Status: DC
  Filled 2021-06-18: qty 1

## 2021-06-18 MED ORDER — ESCITALOPRAM OXALATE 10 MG PO TABS
10.0000 mg | ORAL_TABLET | Freq: Every day | ORAL | Status: DC
  Filled 2021-06-18: qty 1

## 2021-06-18 MED ORDER — TRAZODONE HCL 100 MG PO TABS: 50.0000 mg | ORAL_TABLET | Freq: Every day | ORAL | Status: DC

## 2021-06-18 MED ORDER — SODIUM CHLORIDE 0.9 % IV SOLN
1.0000 g | Freq: Two times a day (BID) | INTRAVENOUS | Status: DC
  Administered 2021-06-18 (×2): 1 g via INTRAVENOUS
  Filled 2021-06-18 (×2): qty 1

## 2021-06-18 NOTE — Plan of Care (Signed)
  Problem: Health Behavior/Discharge Planning: Goal: Ability to manage health-related needs will improve Outcome: Progressing   Problem: Education: Goal: Knowledge of General Education information will improve Description: Including pain rating scale, medication(s)/side effects and non-pharmacologic comfort measures Outcome: Progressing   Problem: Clinical Measurements: Goal: Diagnostic test results will improve Outcome: Progressing   

## 2021-06-18 NOTE — Progress Notes (Addendum)
PROGRESS NOTE    Julia Shea  [id number] DOB: Feb 17, 1956 DOA: 06/17/2021 PCP: Patient, No Pcp Per (Inactive)    Brief Narrative:  Julia Shea was admitted to the hospital working diagnosis of sepsis due to bilateral thigh abscesses, possible necrotizing fascitis.   65 year old female past medical history for GERD, hypertension, type 2 diabetes mellitus, chronic kidney disease, type 2 diabetes mellitus, hypertension, chronic indwelling Foley catheter who presented from [facility] due to abdominal pain.  Recent diagnosis for COVID-19 4-5 days ago, apparently received antiviral therapy.  Because of persistent abdominal and left thigh pain EMS was called, her blood pressure was found to be 66/42, she received 1 L of saline and was transferred to the hospital.  On further questioning reported left thigh pain for few days, associated with edema.  In the ED her blood pressure was 94/50, heart rate 82, respiratory rate 20, temperature 98.3, oxygen saturation 98%, her lungs were clear to auscultation bilaterally, heart S1-S2, present, rhythmic, soft abdomen, no lower extremity edema.  Patient had dry mucous membranes, alert and oriented.  Sodium 132, potassium 4.0, chloride 105, bicarb 18, glucose 90, BUN 83, creatinine 1.61, white count 33.1, hemoglobin 7.8, hematocrit 24.3, platelets 491. SARS COVID-19 positive.  Urinalysis [place] gravity 1.018, 21-50 red cells,> 50 white cells.  CT of the abdomen and pelvis with enhancement of the right renal pelvis and proximal mid right ureter. Left greater than right medial thigh intramuscular fluid collections with peripheral enhancement, 4.6 x 5.0 cm in the left, 4.7 x 2.6 cm on the right.  The collection meet at the midline, inferior to the pubic rami and between the pubic symphysis, anterior to the urethra.  Significant surrounding stranding and multiple foci of air, concerning for abscess, possible necrotizing fasciitis.  Chest radiograph with left  rotation, no infiltrates.  EKG 83 bpm, normal axis, normal intervals, sinus rhythm, no significant ST segment or T wave changes.  Assessment & Plan:   Active Problems:   Diabetes mellitus type 2, uncontrolled (H[facility])   Chronic indwelling Foley catheter   Hyponatremia   Acute lower UTI   AKI (acute kidney injury) ([place])   Anemia of chronic disease   Sepsis ([place])   Necrotizing fasciitis ([place])   COVID-19 virus infection   Sepsis due to bilateral thigh abscess, possible necrotizing fascitis. I talked to Julia Shea about her condition and she confirms not willing to have aggressive interventions like resuscitation or mechanical ventilation, but accepts treatment with antibiotics and percutaneous drainage if needed.  Plan to continue antibiotic therapy, broad spectrum with vancomycin meropenem and clindamycin  Will consult IR for possible collection drainage and culture sampling.  Pain control with IV morphine Continue IV fluids bolus to prevent hypotension. Will consider dc meropenem in the next 24 hrs.   2. Chronic indwelling foley cathter/ ambulatory dysfunction. Possible urine infection, can not rule out urine infection in the setting of chronic cathter, no clear urinary symptoms. Continue antibiotic therapy, imaging with no signs of hydronephrosis or obstruction.   3. AKI on CKD stage 3a/ hyponatremia, non anion gap metabolic acidosis.  Renal function with serum cr at 1,42 with K at 3,5 and serum bicarbonate at 16. Na is 132 and Cl 105.  Continue volume repletion with isotonic saline at 100 ml per hr.   4. COVID 19 positive. No clinical signs of viral pneumonia, her initial diagnosis has been apparently 4 to 5 days ago, no current indication for antiviral therapy and in the setting of bacterial sepsis will  hold on steroids.    5. Worsening acute on chronic anemia. Hgb is down to 6,5 will add 2 units PRBC transfusion and will follow up cell count in am. No clinical signs of bleeding.    6. T2DM. Continue glucose cover and monitoring with insulin sliding scale. Regular diet for now.   Patient continue to be at high risk for worsening sepsis   Status is: Inpatient  Remains inpatient appropriate because:IV treatments appropriate due to intensity of illness or inability to take PO  Dispo: The patient is from: SNF              Anticipated d/c is to: SNF              Patient currently is not medically stable to d/c.   Difficult to place patient No  DVT prophylaxis: Enoxaparin   Code Status:    DNR   Family Communication:       Consultants:  IR   Antimicrobials:  Vancomycin  Clindamycin  Meropenem.    Subjective: Patient continue to be very weak and deconditioned, no nausea or vomiting, no dyspnea, continue to have thigh pain bilaterally.   Objective: Vitals:   06/18/21 1330 06/18/21 1400 06/18/21 1420 06/18/21 1452  BP: (!) 95/52   (!) 81/46  Pulse: 79  78 76  Resp: (!) '9  10 11  '$ Temp:  98.2 F (36.8 C)  98.7 F (37.1 C)  TempSrc:  Axillary    SpO2: 99%  98% 98%    Intake/Output Summary (Last 24 hours) at 06/18/2021 1608 Last data filed at 06/18/2021 [id number] Gross per 24 hour  Intake 1572.89 ml  Output --  Net 1572.89 ml   There were no vitals filed for this visit.  Examination:   General: Not in pain or dyspnea, deconditioned  Neurology: Awake and alert, non focal  E ENT: positive pallor, no icterus, oral mucosa dry Cardiovascular: No JVD. S1-S2 present, rhythmic, no gallops, rubs, or murmurs. No lower extremity edema. No cold extremities. Pulmonary:  positive breath sounds bilaterally, adequate air movement, no wheezing, rhonchi or rales. Gastrointestinal. Abdomen soft and non tender Skin. Bilateral inner thighs induration, more on the left than the right, foley catheter in place.  Musculoskeletal: no joint deformities     Data Reviewed: I have personally reviewed following labs and imaging studies  CBC: Recent Labs  Lab  06/17/21 2021 06/18/21 0026  WBC 33.1* 28.4*  NEUTROABS 29.8* 26.0*  HGB 7.8* 6.5*  HCT 24.3* 20.5*  MCV 81.0 82.0  PLT 491* [id number]*   Basic Metabolic Panel: Recent Labs  Lab 06/17/21 2021 06/18/21 0026  NA 132* 132*  K 4.0 3.5  CL 105 105  CO2 18* 16*  GLUCOSE 90 104*  BUN 83* 72*  CREATININE 1.61* 1.42*  CALCIUM 7.7* 6.6*   GFR: CrCl cannot be calculated (Unknown ideal weight.). Liver Function Tests: Recent Labs  Lab 06/18/21 0026  AST 15  ALT 11  ALKPHOS 151*  BILITOT 1.1  PROT 4.7*  ALBUMIN 1.6*   No results for input(s): LIPASE, AMYLASE in the last 168 hours. No results for input(s): AMMONIA in the last 168 hours. Coagulation Profile: No results for input(s): INR, PROTIME in the last 168 hours. Cardiac Enzymes: Recent Labs  Lab 06/18/21 0026  CKTOTAL 18*   BNP (last 3 results) No results for input(s): PROBNP in the last 8760 hours. HbA1C: No results for input(s): HGBA1C in the last 72 hours. CBG: Recent Labs  Lab 06/18/21 0022  06/18/21 0452 06/18/21 1126 06/18/21 1236  GLUCAP 117* 207* 99 98   Lipid Profile: No results for input(s): CHOL, HDL, LDLCALC, TRIG, CHOLHDL, LDLDIRECT in the last 72 hours. Thyroid Function Tests: No results for input(s): TSH, T4TOTAL, FREET4, T3FREE, THYROIDAB in the last 72 hours. Anemia Panel: Recent Labs    06/18/21 0026  FERRITIN 838*      Radiology Studies: I have reviewed all of the imaging during this hospital visit personally     Scheduled Meds:  escitalopram  20 mg Oral Daily   famotidine  20 mg Oral BID   gabapentin  100 mg Oral BID   insulin aspart  0-9 Units Subcutaneous Q4H   insulin glargine-yfgn  15 Units Subcutaneous Daily   mirabegron ER  25 mg Oral QPM   Continuous Infusions:  sodium chloride 10 mL/hr at 06/18/21 0031   clindamycin (CLEOCIN) IV Stopped (06/18/21 1245)   meropenem (MERREM) IV Stopped (06/18/21 1331)   [START ON 06/20/2021] vancomycin       LOS: 0 days         Perry Molla Gerome Apley, MD

## 2021-06-18 NOTE — Progress Notes (Signed)
Pharmacy Antibiotic Note  Julia Shea is a 65 y.o. female admitted on 06/17/2021 with UTI and necrotizing fascitis .  Pharmacy has been consulted for Meropenem & Vancomycin dosing.  Patient is wheelchair bound but states she is 30f 2in and ~140# to provide estimated CrCl ~353mmin.  Discussed with MD- given hx of PCN allergy and report of enterococcal UTI from nursing facility she would like to use Meropenem.  Patient has tolerated cephalosporins on multiple occasions in past.   Plan: Meropenem 1gm IV q12h Vancomycin '1500mg'$  IV x1 then '1250mg'$  IV q48h to target AUC 400-550 Check Vancomycin levels at steady state Monitor renal function and cx data      Temp (24hrs), Avg:98.3 F (36.8 C), Min:98.3 F (36.8 C), Max:98.3 F (36.8 C)  Recent Labs  Lab 06/17/21 2021 06/17/21 2316  WBC 33.1*  --   CREATININE 1.61*  --   LATICACIDVEN 1.3 1.7    CrCl cannot be calculated (Unknown ideal weight.).    Allergies  Allergen Reactions   Penicillins     Has patient had a PCN reaction causing immediate rash, facial/tongue/throat swelling, SOB or lightheadedness with hypotension: Yes Has patient had a PCN reaction causing severe rash involving mucus membranes or skin necrosis: Yes Has patient had a PCN reaction that required hospitalization: Yes Has patient had a PCN reaction occurring within the last 10 years: Yes If all of the above answers are "NO", then may proceed with Cephalosporin use.   Sulfa Antibiotics     Antimicrobials this admission: 9/18 Meropenem >>  9/18 Vancomycin >> 9/18 Clindamycin >>  9/17 Levaquin x1  Dose adjustments this admission:  Microbiology results: 9/17 BCx:  9/17 UCx:    Thank you for allowing pharmacy to be a part of this patient's care.  MiNetta CedarsharmD 06/18/2021 12:49 AM

## 2021-06-18 NOTE — H&P (Addendum)
Julia Shea [id number] DOB: 09-Oct-1955 DOA: 06/17/2021     PCP: Patient, No Pcp Per (Inactive)   Outpatient Specialists:    GI Dr. Rush Landmark, [facility]    Patient arrived to ER on 06/17/21 at [place] Referred by Attending Toy Baker, MD   Patient coming from:   From facility [facility]  Chief Complaint:   Chief Complaint  Patient presents with   Urinary Retention   Tailbone Pain   Abnormal Lab    HPI: Julia Shea is a 65 y.o. female with medical history significant of GERD, HTN, DM, CKD, chronic anemia, DM2, chronic foley, HTN MRSA    Presented with   dark urine and decreased urine output as well as pelvic pain tested positive for COVID 4 to 5 days ago patient states that she was treated for that.  UA grew Enterococcus at some point and she was supposedly treated for that but continues to have symptoms.  EMS was called today on arrival vitals 66/42 she was given 1 L bolus and blood pressure improved to 117/60. Patient reports that she has been having significant left thigh pain for few days now.  She also have had some swelling there as well Patient has chronic indwelling: Foley catheter recurrent UTIs "it was after having a hole in her bladder in 2018" when asked regarding it she says it is because my urine drains from my kidneys to my bladder states that she is unable to walk because she has bilateral hips pain and indwelling Foley she has not been walking for years.  Patient came with MOST form that says that she is okay to get antibiotics and IV fluids patient confirms that she does not want aggressive interventions no procedures she does not wish to have any surgery done.  Discussed with patient at length that she most likely have severe infection in her thigh and will die result surgical procedure.  Patient states she understands and she would like to be comfort care at this point she still would like to try to have antibiotics and IV fluids but no further  interventions  Has  been vaccinated against COVID **and boosted   Known COVID positive  Lab Results  Component Value Date   SARSCOV2NAA NEGATIVE 11/21/2020   [place] NEGATIVE 09/07/2019   [place] NEGATIVE 08/21/2019     Regarding pertinent Chronic problems:          DM 2 -Lantus Lab Results  Component Value Date   HGBA1C 14.6 (H) 08/21/2019   on insulin,      CKD stage IIIb- baseline Cr 1.2 CrCl cannot be calculated (Unknown ideal weight.).  Lab Results  Component Value Date   CREATININE 1.61 (H) 06/17/2021   CREATININE 1.20 (H) 12/20/2020   CREATININE 0.86 09/08/2019      Chronic anemia - baseline hg Hemoglobin & Hematocrit  Recent Labs    12/20/20 1307 06/17/21 2021  HGB 10.3* 7.8*     While in ER: Initially found to have urinary tract infection started on Levaquin on further exam noted to have significant left thigh swelling CT of abdomen pelvis showing air bilaterally in the left thigh is worse than right. Discussed at length with patient she at this point refuses to have surgical intervention although she states she understands that without surgical intervention she may die.  Patient states she understands and she would like to be made comfort care only and concentrate on comfort.  Try to get a hold of patient's family her  brother.  No answer.  Discussed with patient if she wanted to have chaplain she says not now.    ED Triage Vitals  Enc Vitals Group     BP 06/17/21 1950 (!) 94/50     Pulse Rate 06/17/21 1950 82     Resp 06/17/21 1950 20     Temp 06/17/21 1950 98.3 F (36.8 C)     Temp Source 06/17/21 1950 Oral     SpO2 06/17/21 1950 98 %     Weight --      Height --      Head Circumference --      Peak Flow --      Pain Score 06/17/21 1951 0     Pain Loc --      Pain Edu? --      Excl. in [place]? --   TMAX(24)@     _________________________________________ Significant initial  Findings: Abnormal Labs Reviewed  CBC WITH  DIFFERENTIAL/PLATELET - Abnormal; Notable for the following components:      Result Value   WBC 33.1 (*)    RBC 3.00 (*)    Hemoglobin 7.8 (*)    HCT 24.3 (*)    RDW 15.6 (*)    Platelets 491 (*)    Neutro Abs 29.8 (*)    Abs Immature Granulocytes 1.32 (*)    All other components within normal limits  BASIC METABOLIC PANEL - Abnormal; Notable for the following components:   Sodium 132 (*)    CO2 18 (*)    BUN 83 (*)    Creatinine, Ser 1.61 (*)    Calcium 7.7 (*)    GFR, Estimated 35 (*)    All other components within normal limits  URINALYSIS, ROUTINE W REFLEX MICROSCOPIC - Abnormal; Notable for the following components:   Color, Urine AMBER (*)    APPearance CLOUDY (*)    Hgb urine dipstick SMALL (*)    Protein, ur 100 (*)    Leukocytes,Ua LARGE (*)    WBC, UA >50 (*)    Bacteria, UA MANY (*)    All other components within normal limits     CTabd/pelvis -  In the left greater than right medial thigh, there are intramuscular fluid collections, with peripheral enhancement, measuring up to 4.6 x 5.0 cm on the left and 4.7 x 2.6 cm on the right  These demonstrates significant surrounding stranding and multiple foci of air. concerning for abscesses, although necrotizing fasciitis can not be excluded. Enhancement of the right renal pelvis and proximal to mid right ureter, without decreased enhancement in the right kidney, concerning for ascending ureteral infection in the setting of known urinary tract infection, without current evidence of pyelonephritis.   ECG: Ordered _________________ This patient meets SIRS Criteria and may be septic.   The recent clinical data is shown below. Vitals:   06/17/21 2030 06/17/21 2130 06/17/21 2230 06/17/21 2300  BP: (!) 123/59 110/60 (!) 106/51 (!) 108/54  Pulse: 86 82 90 88  Resp: '17 17 17 17  '$ Temp:      TempSrc:      SpO2: 99% 99% 99% 99%    WBC     Component Value Date/Time   WBC 33.1 (H) 06/17/2021 2021   LYMPHSABS 0.8  06/17/2021 2021   LYMPHSABS 4.4 (H) 02/06/2018 1205   MONOABS 0.9 06/17/2021 2021   EOSABS 0.1 06/17/2021 2021   EOSABS 0.2 02/06/2018 1205   BASOSABS 0.1 06/17/2021 2021   BASOSABS 0.0 02/06/2018 1205  Lactic Acid, Venous    Component Value Date/Time   LATICACIDVEN 1.7 06/17/2021 2316     UA  evidence of UTI      Urine analysis:    Component Value Date/Time   COLORURINE AMBER (A) 06/17/2021 2021   APPEARANCEUR CLOUDY (A) 06/17/2021 2021   LABSPEC 1.018 06/17/2021 2021   PHURINE 8.0 06/17/2021 2021   GLUCOSEU NEGATIVE 06/17/2021 2021   HGBUR SMALL (A) 06/17/2021 2021   BILIRUBINUR NEGATIVE 06/17/2021 2021   BILIRUBINUR neg 05/15/2013 [place] 06/17/2021 2021   PROTEINUR 100 (A) 06/17/2021 2021   UROBILINOGEN 0.2 05/15/2013 1038   NITRITE NEGATIVE 06/17/2021 2021   LEUKOCYTESUR LARGE (A) 06/17/2021 2021    Results for orders placed or performed during the hospital encounter of 12/20/20  Urine culture     Status: Abnormal   Collection Time: 12/20/20  5:57 PM   Specimen: Urine, Random  Result Value Ref Range Status   Specimen Description   Final    URINE, RANDOM Performed at [facility], [place] [address] [place], [place] [address]    Special Requests   Final    NONE Performed at [facility], [place] [address] [place], [place] [address]    Culture MULTIPLE SPECIES PRESENT, SUGGEST RECOLLECTION (A)  Final   Report Status 12/22/2020 FINAL  Final     _______________________________________________ Hospitalist was called for admission for Sepsis due to UTi and cellulitis/ necrotizing fasciitis  The following Work up has been ordered so far:  Orders Placed This Encounter  Procedures   Culture, blood (routine x 2)   Urine Culture   Resp Panel by RT-PCR (Flu A&B, Covid) Nasopharyngeal Swab   CT Abdomen Pelvis W Contrast   DG Chest Port 1 View   CBC with Differential   Basic metabolic panel   Lactic acid,  plasma   Urinalysis, Routine w reflex microscopic   HIV Antibody (routine testing w rflx)   C-reactive protein   D-dimer, quantitative (not at [facility])   Ferritin   Fibrinogen   Lactate dehydrogenase   Procalcitonin   Comprehensive metabolic panel   CBC with Differential/Platelet   CBC with Differential/Platelet   Comprehensive metabolic panel   C-reactive protein   D-dimer, quantitative   Magnesium   Phosphorus   Hemoglobin A1c   CK   Nursing Communication Flush foley   Cardiac monitoring   Novel Coronavirus PPE supplies (droplet and contact precautions) yellow stethoscopes, surgical mask, gowns, surgical caps, face shield, goggles, CAPR - on the floor/unit, cleaning Sani-Cloth (orange and purple top)   Place COVID-19 isolation sign and PPE checklist outside the DOOR   Do not give nonsteroidal anti-inflammatory drugs (NSAIDs)   Patient to wear surgical mask during transportation   Place working phone next to the patient   Initiate Oral Care Protocol   Initiate Carrier Fluid Protocol   Cardiac Monitoring - Continuous Indefinite   STAT CBG when hypoglycemia is suspected. If treated, recheck every 15 minutes after each treatment until CBG >/= 70 mg/dl   Refer to Hypoglycemia Protocol Sidebar Report for treatment of CBG < 70 mg/dl   Notify physician (specify)   Vital signs   If Patient has implantable cardiac defibrillator (ICD), contact product vendor to reprogram device to "off" setting.  (Medtronic 1-800-MEDTRONIC;  Guidant 1-800-CARDIAC;  St. Jude 1-800-PACEICD)   RN may pronounce death   02/28/2023 have comfort feeds from floor stock   Maintain saline lock   Air mattress  Do not attempt resuscitation (DNR)   Consult to hospitalist   vancomycin per pharmacy consult   meropenem [facility]) per pharmacy consult         Airborne and Contact precautions   Incentive spirometry   Flutter valve   EKG 12-Lead   Insert saline lock   Place in observation (patient's expected length of stay  will be less than 2 midnights)   Place in observation (patient's expected length of stay will be less than 2 midnights)   Place in observation (patient's expected length of stay will be less than 2 midnights)     Following Medications were ordered in ER: Medications  levofloxacin (LEVAQUIN) IVPB 750 mg (750 mg Intravenous New Bag/Given 06/17/21 2318)  meropenem (MERREM) 1 g in sodium chloride 0.9 % 100 mL IVPB (has no administration in time range)  clindamycin (CLEOCIN) IVPB 600 mg (has no administration in time range)  insulin aspart (novoLOG) injection 0-9 Units (has no administration in time range)  vancomycin (VANCOREADY) IVPB 1500 mg/300 mL (has no administration in time range)  acetaminophen (TYLENOL) tablet 650 mg (has no administration in time range)    Or  acetaminophen (TYLENOL) suppository 650 mg (has no administration in time range)  haloperidol (HALDOL) tablet 0.5 mg (has no administration in time range)    Or  haloperidol (HALDOL) 2 MG/ML solution 0.5 mg (has no administration in time range)    Or  haloperidol lactate (HALDOL) injection 0.5 mg (has no administration in time range)  ondansetron (ZOFRAN-ODT) disintegrating tablet 4 mg (has no administration in time range)    Or  ondansetron (ZOFRAN) injection 4 mg (has no administration in time range)  glycopyrrolate (ROBINUL) tablet 1 mg (has no administration in time range)    Or  glycopyrrolate (ROBINUL) injection 0.2 mg (has no administration in time range)    Or  glycopyrrolate (ROBINUL) injection 0.2 mg (has no administration in time range)  antiseptic oral rinse (BIOTENE) solution 15 mL (has no administration in time range)  polyvinyl alcohol (LIQUIFILM TEARS) 1.4 % ophthalmic solution 1 drop (has no administration in time range)  0.9 %  sodium chloride infusion (has no administration in time range)  morphine 2 MG/ML injection 2-4 mg (has no administration in time range)  sodium chloride 0.9 % bolus 1,000 mL (1,000  mLs Intravenous New Bag/Given 06/17/21 1952)  iohexol (OMNIPAQUE) 350 MG/ML injection 80 mL (80 mLs Intravenous Contrast Given 06/17/21 2200)        Consult Orders  (From admission, onward)           Start     Ordered   06/17/21 2242  Consult to hospitalist  Once       Provider:  (Not yet assigned)  Question Answer Comment  Place call to: [facility]   Reason for Consult Admit      06/17/21 2241             OTHER Significant initial  Findings:  labs showing:    Recent Labs  Lab 06/17/21 2021  NA 132*  K 4.0  CO2 18*  GLUCOSE 90  BUN 83*  CREATININE 1.61*  CALCIUM 7.7*    Cr   Up from baseline see below Lab Results  Component Value Date   CREATININE 1.61 (H) 06/17/2021   CREATININE 1.20 (H) 12/20/2020   CREATININE 0.86 09/08/2019    No results for input(s): AST, ALT, ALKPHOS, BILITOT, PROT, ALBUMIN in the last 168 hours. Lab Results  Component Value Date  CALCIUM 7.7 (L) 06/17/2021   PHOS 1.9 (L) 03/31/2018          Plt: Lab Results  Component Value Date   PLT 491 (H) 06/17/2021     COVID-19 Labs  No results for input(s): DDIMER, FERRITIN, LDH, CRP in the last 72 hours.  Lab Results  Component Value Date   SARSCOV2NAA NEGATIVE 11/21/2020   SARSCOV2NAA NEGATIVE 09/07/2019   [place] NEGATIVE 08/21/2019        Recent Labs  Lab 06/17/21 2021  WBC 33.1*  NEUTROABS 29.8*  HGB 7.8*  HCT 24.3*  MCV 81.0  PLT 491*    HG/HCT   Down  from baseline see below    Component Value Date/Time   HGB 7.8 (L) 06/17/2021 2021   HGB 10.7 (L) 08/13/2018 1211   HCT 24.3 (L) 06/17/2021 2021   HCT 32.2 (L) 08/13/2018 1211   MCV 81.0 06/17/2021 2021   MCV 86 08/13/2018 1211      CBG (last 3)  No results for input(s): GLUCAP in the last 72 hours.    Cultures:    Component Value Date/Time   SDES  12/20/2020 1757    URINE, RANDOM Performed at [facility], [place] [address], [place], [place] [address]     SPECREQUEST  12/20/2020 1757    NONE Performed at [facility], [place] [address], [place], [place] [address]    CULT MULTIPLE SPECIES PRESENT, SUGGEST RECOLLECTION (A) 12/20/2020 1757   REPTSTATUS 12/22/2020 FINAL 12/20/2020 1757     Radiological Exams on Admission: CT Abdomen Pelvis W Contrast  Result Date: 06/17/2021 CLINICAL DATA:  Abdominal pain, decreased urine output EXAM: CT ABDOMEN AND PELVIS WITH CONTRAST TECHNIQUE: Multidetector CT imaging of the abdomen and pelvis was performed using the standard protocol following bolus administration of intravenous contrast. CONTRAST:  65m OMNIPAQUE IOHEXOL 350 MG/ML SOLN COMPARISON:  02/19/2021 FINDINGS: Lower chest: No acute abnormality. Hepatobiliary: No focal liver abnormality is seen. No gallstones, gallbladder wall thickening, or biliary dilatation. The gallbladder is mildly distended, with flattening of the fundus. Pancreas: Unremarkable. No pancreatic ductal dilatation or surrounding inflammatory changes. Spleen: Normal in size without focal abnormality. Adrenals/Urinary Tract: The adrenal glands are unremarkable. The kidneys enhance symmetrically with no hydronephrosis. Subcentimeter hypoattenuating lesions, too small to characterize but most likely renal cysts. Enhancement of the right renal pelvis and proximal to mid right ureter. The bladder appears thickened and contains a Foley. Stomach/Bowel: The stomach is normal in appearance. No evidence of bowel wall thickening, distention, or obstruction. The appendix is unremarkable. Vascular/Lymphatic: Aortic atherosclerosis. No enlarged abdominal or pelvic lymph nodes. Prominent right external iliac chain lymph node (series 71 from image 96), measuring up to 6 mm in short axis, likely reactive. Reproductive: The uterus is present.  No adnexal mass. Other: No free fluid or free air in the abdomen or pelvis. Musculoskeletal: No acute osseous abnormality.  Osteopenia. IMPRESSION:  Enhancement of the right renal pelvis and proximal to mid right ureter, without decreased enhancement in the right kidney, concerning for ascending ureteral infection in the setting of known urinary tract infection, without current evidence of pyelonephritis. Electronically Signed   By: AMerilyn BabaM.D.   On: 06/17/2021 22:37   DG Chest Port 1 View  Result Date: 06/17/2021 CLINICAL DATA:  Leukocytosis.  UTI.  COVID positive for 5 days ago. EXAM: PORTABLE CHEST 1 VIEW.  Patient is markedly rotated COMPARISON:  Chest x-ray 11/12/2019, CT chest 08/30/2019 FINDINGS: The heart and mediastinal contours are  limited due to patient rotation. Aortic calcification. No definite focal consolidation. Linear atelectasis versus scarring within the right lung. No pulmonary edema. No pleural effusion. No pneumothorax. No acute osseous abnormality. IMPRESSION: No definite acute cardiopulmonary abnormality with limited evaluation due to marked patient rotation. Recommend repeat chest x-ray. Electronically Signed   By: Iven Finn M.D.   On: 06/17/2021 22:00   _______________________________________________________________________________________________________ Latest  Blood pressure (!) 108/54, pulse 88, temperature 98.3 F (36.8 C), temperature source Oral, resp. rate 17, SpO2 99 %.   Review of Systems:    Pertinent positives include:  chills, fatigue, leg pain  Constitutional:  No weight loss, night sweats, Fevers, weight loss  HEENT:  No headaches, Difficulty swallowing,Tooth/dental problems,Sore throat,  No sneezing, itching, ear ache, nasal congestion, post nasal drip,  Cardio-vascular:  No chest pain, Orthopnea, PND, anasarca, dizziness, palpitations.no Bilateral lower extremity swelling  GI:  No heartburn, indigestion, abdominal pain, nausea, vomiting, diarrhea, change in bowel habits, loss of appetite, melena, blood in stool, hematemesis Resp:  no shortness of breath at rest. No dyspnea on exertion,  No excess mucus, no productive cough, No non-productive cough, No coughing up of blood.No change in color of mucus.No wheezing. Skin:  no rash or lesions. No jaundice GU:  no dysuria, change in color of urine, no urgency or frequency. No straining to urinate.  No flank pain.  Musculoskeletal:  No joint pain or no joint swelling. No decreased range of motion. No back pain.  Psych:  No change in mood or affect. No depression or anxiety. No memory loss.  Neuro: no localizing neurological complaints, no tingling, no weakness, no double vision, no gait abnormality, no slurred speech, no confusion  All systems reviewed and apart from [place] all are negative _______________________________________________________________________________________________ Past Medical History:   Past Medical History:  Diagnosis Date   Anemia    Asthma    no inhalers   Bilateral hip pain    Bladder problem    chronic in-dwelling Foley after developing "a hole in my bladder", reportedly in 2018   Cataract    bilateral, clinical blind   Chronic indwelling Foley catheter    Closed comminuted left humeral fracture    ORIF in June 2017   Depression    Diabetes mellitus type 2, with complication, on long term insulin pump ([place])    Falls frequently    none since 03/2020   GERD (gastroesophageal reflux disease)    Hypertension    MRSA infection    Nausea & vomiting    Rotator cuff tear arthropathy, left       Past Surgical History:  Procedure Laterality Date   BIOPSY  11/24/2020   Procedure: BIOPSY;  Surgeon: Irving Copas., MD;  Location: [place];  Service: Gastroenterology;;   COLONOSCOPY WITH PROPOFOL N/A 11/24/2020   Procedure: COLONOSCOPY WITH PROPOFOL;  Surgeon: Irving Copas., MD;  Location: [place];  Service: Gastroenterology;  Laterality: N/A;   ESOPHAGOGASTRODUODENOSCOPY (EGD) WITH PROPOFOL N/A 11/24/2020   Procedure: ESOPHAGOGASTRODUODENOSCOPY (EGD) WITH PROPOFOL;   Surgeon: Rush Landmark Telford Nab., MD;  Location: [place];  Service: Gastroenterology;  Laterality: N/A;   EYE SURGERY Bilateral    cataracts   IMPACTION REMOVAL  11/24/2020   Procedure: IMPACTION REMOVAL;  Surgeon: Rush Landmark Telford Nab., MD;  Location: [place];  Service: Gastroenterology;;   LAPAROSCOPIC OOPHORECTOMY     removal of cyst   POLYPECTOMY  11/24/2020   Procedure: POLYPECTOMY;  Surgeon: Rush Landmark Telford Nab., MD;  Location: [place];  Service:  Gastroenterology;;   TEE WITHOUT CARDIOVERSION N/A 09/07/2019   Procedure: TRANSESOPHAGEAL ECHOCARDIOGRAM (TEE);  Surgeon: Teodoro Spray, MD;  Location: [facility];  Service: Cardiovascular;  Laterality: N/A;    Social History:  Ambulatory   bed bound     reports that she has never smoked. She has never used smokeless tobacco. She reports that she does not drink alcohol and does not use drugs.     Family History:   Family History  Problem Relation Age of Onset   Heart attack Mother    Heart attack Father    Diabetes Maternal Uncle    Colon cancer Neg Hx    Esophageal cancer Neg Hx    Inflammatory bowel disease Neg Hx    Liver disease Neg Hx    Pancreatic cancer Neg Hx    Rectal cancer Neg Hx    Stomach cancer Neg Hx    ______________________________________________________________________________________________ Allergies: Allergies  Allergen Reactions   Penicillins     Has patient had a PCN reaction causing immediate rash, facial/tongue/throat swelling, SOB or lightheadedness with hypotension: Yes Has patient had a PCN reaction causing severe rash involving mucus membranes or skin necrosis: Yes Has patient had a PCN reaction that required hospitalization: Yes Has patient had a PCN reaction occurring within the last 10 years: Yes If all of the above answers are "NO", then may proceed with Cephalosporin use.   Sulfa Antibiotics      Prior to Admission medications   Medication Sig Start Date End Date  Taking? Authorizing Provider  acetaminophen (TYLENOL) 325 MG tablet Take 650 mg by mouth every 8 (eight) hours as needed for moderate pain.    [provider]  escitalopram (LEXAPRO) 10 MG tablet Take 10 mg by mouth daily. Take with '5mg'$  tablet to equal '15mg'$  once daily    [provider]  escitalopram (LEXAPRO) 5 MG tablet Take 5 mg by mouth daily. Take with '10mg'$  tablet to equal '15mg'$  once daily    [provider]  ferrous sulfate 325 (65 FE) MG tablet Take 1 tablet (325 mg total) by mouth daily with breakfast. Patient taking differently: Take 325 mg by mouth 2 (two) times daily with a meal. 09/09/19   Nicole Kindred A, DO  gabapentin (NEURONTIN) 100 MG capsule Take 100 mg by mouth 2 (two) times daily.    [provider]  insulin aspart (NOVOLOG) 100 UNIT/ML injection Inject 2-10 Units into the skin 3 (three) times daily with meals. If blood sugar is 151-200= 2 units, 201-250= 4 units, 251-300= 6 units, 301-350= 8 units, 351-400= 10 units. If blood sugar is greater than 400, call MD.    [provider]  Insulin Glargine (BASAGLAR KWIKPEN) 100 UNIT/ML Inject 15 Units into the skin daily.    [provider]  Lidocaine (SALONPAS PAIN RELIEVING) 4 % PTCH Apply 1 patch topically See admin instructions. Apply to right shoulder for 12 hours, then remove    [provider]  liver oil-zinc oxide (DESITIN) 40 % ointment Apply topically 3 (three) times daily. Patient taking differently: Apply 1 application topically 3 (three) times daily. Apply to perianal area 09/08/19   Nicole Kindred A, DO  magnesium oxide (MAG-OX) 400 (241.3 Mg) MG tablet Take 1 tablet (400 mg total) by mouth daily. 09/09/19   Nicole Kindred A, DO  mirabegron ER (MYRBETRIQ) 25 MG TB24 tablet Take 25 mg by mouth every evening.    [provider]  Multiple Vitamins-Minerals (MULTIVITAMIN WITH MINERALS) tablet Take 1  tablet by mouth daily. 02/06/18   Tukov-Yual, Arlyss Gandy, NP   nitrofurantoin, macrocrystal-monohydrate, (MACROBID) 100 MG capsule Take 1 capsule (100 mg total) by mouth 2 (two) times daily. 12/20/20   Truddie Hidden, MD  ondansetron (ZOFRAN-ODT) 4 MG disintegrating tablet Take 4 mg by mouth 4 (four) times daily as needed for nausea or vomiting.    [provider]  pantoprazole (PROTONIX) 40 MG tablet Take 40 mg by mouth 2 (two) times daily.    [provider]  potassium chloride (KLOR-CON) 10 MEQ tablet Take 1 tablet (10 mEq total) by mouth daily. 09/08/19   Ezekiel Slocumb, DO  traZODone (DESYREL) 50 MG tablet Take 50 mg by mouth at bedtime.    [provider]    ___________________________________________________________________________________________________ Physical Exam: Vitals with BMI 06/17/2021 06/17/2021 06/17/2021  Height - - -  Weight - - -  BMI - - -  Systolic [id number]  Diastolic 54 51 60  Pulse 88 90 82     1. General:  in No Acute distress complaining of severe pain   Chronically ill -appearing 2. Psychological: Alert and  Oriented 3. Head/ENT:    Dry Mucous Membranes                          Head Non traumatic, neck supple                           Poor Dentition 4. SKIN: decreased Skin turgor,  Skin clean Dry and intact left thigh with severe swelling and redness tender to palpation  5. Heart: Regular rate and rhythm no  Murmur, no Rub or gallop 6. Lungs:   no wheezes or crackles   7. Abdomen: Soft,  non-tender, Non distended  bowel sounds present 8. Lower extremities: no clubbing, cyanosis, no  edema 9. Neurologically Grossly intact, moving all 4 extremities equally   10. MSK: Normal range of motion    Chart has been reviewed  ______________________________________________________________________________________________  Assessment/Plan 65 y.o. female with medical history significant of GERD, HTN, DM, CKD, chronic anemia, DM2, chronic foley, HTN MRSA   Admitted for Sepsis due to UTi and  cellulitis/ necrotizing fasciitis  Present on Admission:  Sepsis ([place]) -patient wishes to be comfort care only avoid over aggressive fluid resuscitation as this can lead to significant shortness of breath.  Continue antibiotics as per patient.  Conservative management otherwise as discussed with patient and her wishes.   Necrotizing fasciitis ([place]) -versus abscess.  Covered by IV antibiotics broad-spectrum vancomycin meropenem and clindamycin states she does not wish to have any surgical intervention she understands that she may die and states she would like to be comfort care and asked to concentrate on comfort only but she is okay to try antibiotics to see if she could recover Order palliative care consult   Hyponatremia -May the setting of dehydration and liver disease Avoid over aggressive fluid resuscitation   Diabetes mellitus type 2, uncontrolled (HCC)-continue home medication monitor blood sugars to avoid hypoglycemia unless patient does not wish to participate in education.    Anemia of chronic disease -chronic no indication for blood transfusion at this time patient is comfort care   AKI (acute kidney injury) ([place]) in the setting of sepsis patient wishes to be comfort care at this time gentle fluids were given initially.  Avoid over aggressive fluid resuscitation as this may be due to further discomfort.  Acute lower UTI -meropenem should be able to cover.  Await results of urine culture possible Enterococcus in the past  Functional debility.  Will need to be discharged back to facility when pain treated by p.o.  Chronic indwelling Foley will continue same reportedly was exchanged while in emergency department  Anemia - repeat hg down to 6.5 discussed with pt who at this point does not wish any blood transfusion Patient alert awake and able to make medical decisions she understands consequences of her decisions.  States she does not wish to receive blood products at this  time Patient is comfort care continue to monitor  Recent COVID infection.  Currently asymptomatic reportedly by patient has been treated while in the facility Continue to observe no indication for further treatment at this time Continue airborne precautions for now Patient is comfort care    Other plan as per orders.  DVT prophylaxis:  SCD    Code Status:   DNR/DNI  comfort care as per patient   I had personally discussed CODE STATUS with patient   I had spent 44mn discussing goals of care and CODE STATUS    Family Communication:   Family not at  Bedside  Called brother no answer  Disposition Plan:    Back to current facility when stable                          Following barriers for discharge:                            Electrolytes corrected                               Anemia stable                             Pain controlled with PO medications                                                          Will need to be able to tolerate PO                                                 Palliative care    consulted                    Transitional care consult                    Consults called: discussed with gen Surgery pt does not wish to have any surgical intervention at this time no role for Surgery  Admission status:  ED Disposition     [id number]  Level of Care: Med-Surg [16]  [facility] is available:: No  Covid Evaluation: Confirmed COVID Positive  Diagnosis: Necrotizing fasciitis ([place] [728.86.ICD-9-CM]  Admitting Physician:  Toy Baker [[id number]]  Attending Physician: Toy Baker [[id number]]           Obs      Level of care      medical floor      Precautions: admitted as  covid positive Airborne and Contact precautions     PPE: Used by the provider:   N95   eye Goggles,  Gloves  gown    Amarilys Lyles 06/18/2021, 12:30 AM    [facility]     after 2 AM please page floor coverage PA If 7AM-7PM, please contact the day team taking care of the patient using [email or web address]   Patient was evaluated in the context of the global COVID-19 pandemic, which necessitated consideration that the patient might be at risk for infection with the SARS-CoV-2 virus that causes COVID-19. Institutional protocols and algorithms that pertain to the evaluation of patients at risk for COVID-19 are in a state of rapid change based on information released by regulatory bodies including the CDC and federal and state organizations. These policies and algorithms were followed during the patient's care.

## 2021-06-18 NOTE — ED Notes (Signed)
Assisted pt to contact her brother Gwyndolyn Saxon around 40 PM. Pt sister in law [name] called back and said [place] phone is not working properly. The patient said I could discuss her medical care with [name]. I updated [name] on pt condition and that pt is now located in room 1423 in 4E.

## 2021-06-19 DIAGNOSIS — M726 Necrotizing fasciitis: Secondary | ICD-10-CM | POA: Diagnosis not present

## 2021-06-19 LAB — BLOOD CULTURE ID PANEL (REFLEXED) - BCID2

## 2021-06-19 LAB — COMPREHENSIVE METABOLIC PANEL
ALT: 12 U/L (ref 0–44)
AST: 19 U/L (ref 15–41)
Albumin: 1.8 g/dL — ABNORMAL LOW (ref 3.5–5.0)
Alkaline Phosphatase: 147 U/L — ABNORMAL HIGH (ref 38–126)
Anion gap: 9 (ref 5–15)
BUN: 88 mg/dL — ABNORMAL HIGH (ref 8–23)
CO2: 16 mmol/L — ABNORMAL LOW (ref 22–32)
Calcium: 7.5 mg/dL — ABNORMAL LOW (ref 8.9–10.3)
Chloride: 108 mmol/L (ref 98–111)
Creatinine, Ser: 2.09 mg/dL — ABNORMAL HIGH (ref 0.44–1.00)
GFR, Estimated: 26 mL/min — ABNORMAL LOW (ref >60)
Glucose, Bld: 75 mg/dL (ref 70–99)
Potassium: 4.3 mmol/L (ref 3.5–5.1)
Sodium: 133 mmol/L — ABNORMAL LOW (ref 135–145)
Total Bilirubin: 2.1 mg/dL — ABNORMAL HIGH (ref 0.3–1.2)
Total Protein: 5.1 g/dL — ABNORMAL LOW (ref 6.5–8.1)

## 2021-06-19 LAB — GLUCOSE, CAPILLARY
Glucose-Capillary: 104 mg/dL — ABNORMAL HIGH (ref 70–99)
Glucose-Capillary: 71 mg/dL (ref 70–99)
Glucose-Capillary: 73 mg/dL (ref 70–99)
Glucose-Capillary: 81 mg/dL (ref 70–99)
Glucose-Capillary: 86 mg/dL (ref 70–99)
Glucose-Capillary: 89 mg/dL (ref 70–99)

## 2021-06-19 LAB — CBC WITH DIFFERENTIAL/PLATELET
Abs Immature Granulocytes: 1.16 10*3/uL — ABNORMAL HIGH (ref 0.00–0.07)
Basophils Absolute: 0 10*3/uL (ref 0.0–0.1)
Basophils Relative: 0 %
Eosinophils Absolute: 0 10*3/uL (ref 0.0–0.5)
Eosinophils Relative: 0 %
HCT: 30.4 % — ABNORMAL LOW (ref 36.0–46.0)
Hemoglobin: 10.2 g/dL — ABNORMAL LOW (ref 12.0–15.0)
Immature Granulocytes: 4 %
Lymphocytes Relative: 3 %
Lymphs Abs: 0.9 10*3/uL (ref 0.7–4.0)
MCH: 27.5 pg (ref 26.0–34.0)
MCHC: 33.6 g/dL (ref 30.0–36.0)
MCV: 81.9 fL (ref 80.0–100.0)
Monocytes Absolute: 0.7 10*3/uL (ref 0.1–1.0)
Monocytes Relative: 2 %
Neutro Abs: 28.5 10*3/uL — ABNORMAL HIGH (ref 1.7–7.7)
Neutrophils Relative %: 91 %
Platelets: 409 10*3/uL — ABNORMAL HIGH (ref 150–400)
RBC: 3.71 MIL/uL — ABNORMAL LOW (ref 3.87–5.11)
RDW: 17 % — ABNORMAL HIGH (ref 11.5–15.5)
WBC: 31.3 10*3/uL — ABNORMAL HIGH (ref 4.0–10.5)
nRBC: 0 % (ref 0.0–0.2)

## 2021-06-19 LAB — URINE CULTURE

## 2021-06-19 MED ORDER — FAMOTIDINE 20 MG PO TABS: 20.0000 mg | ORAL_TABLET | Freq: Every day | ORAL | Status: DC

## 2021-06-19 MED ORDER — VANCOMYCIN HCL IN DEXTROSE 1-5 GM/200ML-% IV SOLN: 1000.0000 mg | INTRAVENOUS | Status: DC

## 2021-06-19 MED ORDER — SODIUM CHLORIDE 0.9 % IV SOLN
500.0000 mg | Freq: Two times a day (BID) | INTRAVENOUS | Status: DC
  Filled 2021-06-19: qty 0.5

## 2021-06-19 MED ORDER — MORPHINE SULFATE (PF) 2 MG/ML IV SOLN
1.0000 mg | INTRAVENOUS | Status: DC | PRN
Start: 2021-06-19 — End: 2021-06-21
  Administered 2021-06-20 – 2021-06-21 (×4): 1 mg via INTRAVENOUS
  Filled 2021-06-19 (×4): qty 1

## 2021-06-19 NOTE — Progress Notes (Signed)
We were asked to see this lady for her bilateral thigh wounds that also track across midline and may be related to her urinary system, but difficult to tell.  When I walked in to see the patient and began to ask questions regarding her history she got frustrated and asked me to stop asking her all these questions.  She then stated "I know I'm dying, please let me be!"  I discussed with her based on her scans and physical exam that she likely has an infection that would require surgical intervention in order to get this better.  She very quickly responded that she would not be having any surgery and that she is dying and is okay with this.  I asked if she wanted any treatment for this problem and she said no not really.  I clarified, abx therapy only with her, and she stated, not really even to just this.  She understands that no treatment will likely cause severe sepsis and ultimately death.  She states she is okay with that.  She is waiting on her brother to come down from [place].  I discussed this conversation with the primary, Dr. Pietro Cassis so she can further pursue her wishes.  We will not plan to follow or treat at this time as this is not the patient's desire.   Julia Shea 9:30 AM 06/19/2021

## 2021-06-19 NOTE — Progress Notes (Signed)
A family member requested to complete an AD for the pt. I spoke with pt and she said that she wanted it done. I help her with filling out the paperwork. I was able to get it witnessed and notarized. The chaplain offered caring and supportive presence.

## 2021-06-19 NOTE — Progress Notes (Signed)
[place]- Call from MD to make pt NPO for surgical consult.   8208- Pt states she wants to die and does not want anything done for her and states her brother from [place], [place] knows that. Pt is alert and oriented x4.  #18g SL in RFA and #20g in LFA with NS @ 128ml/hr. Left inner thigh abscess reddened and swollen.  56fr foley catheter with dark urine present. Bilateral lower extremities elevated on pillows. Will monitor. Call bell within reach.  1050- Call from Kendra Opitz, Admissions Coordinator, at [facility] for update on pt's status and plan for pt's return once hospital course of tx is complete.  1300- Pt declines food, declines to be turned and repositioned at this time. Asked to be left alone.   1600- Advance directive documentation done by Chaplain.

## 2021-06-19 NOTE — Progress Notes (Signed)
PROGRESS NOTE  Julia Shea  DOB: 1956/02/07  PCP: Patient, No Pcp Per (Inactive) DDU:[id number]  DOA: 06/17/2021  LOS: 1 day  Hospital Day: 3   Chief Complaint  Patient presents with   Urinary Retention   Tailbone Pain   Abnormal Lab    Brief narrative: Luccia Reinheimer is a 65 y.o. female with PMH significant for DM2, HTN, CKD, chronic indwelling Foley catheter, GERD who is resident at [facility].  Patient was brought to the ED on 9/17 for abdominal pain and persistent left thigh pain. EMS noted her blood pressure to be low at 66/42, she was given a liter of IV fluid and transferred to hospital  In the ED, she had a blood pressure 94/50 Blood work showed low serum bicarb level 18, creatinine elevated 1.61, WC count elevated to 33.1 COVID-19 positive.  Of note, she was COVID-19 positive 5 days prior at the nursing facility and she received antiviral treatment.  CT of the abdomen and pelvis showed bilateral intramuscular abscess on both thighs of about 5 cm each, with the collection meeting of the midline inferior to the pubic rami and between the pubic symphysis anterior to the urethra.  Findings raising suspicion of abscess as well as necrotizing fasciitis. Admitted to hospitalist service for sepsis secondary to bilateral thigh abscess and necrotizing fasciitis. Patient and family made a decision to do comfort care measures.  Subjective: Patient was seen and examined this morning.  Pleasant elderly Caucasian female.  Partially sedated with pain medicines.  Opens eyes on verbal command.  She clearly states that she does not want any treatment done for her condition.  She does not want any antibiotics, needle aspiration or surgery as previously planned.  On my conversation, it seems pretty clear that she is in her sound mind.  I discussed this with her brother and sister-in-law on the phone.  They recall that patient clearly wanted to be comfort care when gravely ill.  They agreed to  patient's decision and would like hospice care.  Assessment/Plan: Comfort care status -Initially admitted for sepsis secondary to bilateral thigh abscess and necrotizing fasciitis.  As per the decision by patient and family this morning, I will stop broad-spectrum antibiotics.  We will not go ahead with abscess drainage at this time.   -Comfort care measures ordered. -Hospice consult  Other medical issues AKI on CKD 3A Hyponatremia Recent COVID-19 infection Acute on chronic anemia    Code Status:   Code Status: DNR  Nutritional status: Body mass index is 25.21 kg/m.     Diet: luxury feeding if patient is able to tolerate Diet Order             Diet regular Room service appropriate? Yes; Fluid consistency: Thin  Diet effective now                  DVT prophylaxis:    Antimicrobials: IV antibiotics stopped Fluid:  Consultants: None Family Communication: Discussed with family today  Status is: Inpatient  Remains inpatient appropriate because: Pending residential hospice placement  Dispo: The patient is from: [facility]              Anticipated d/c is to: Likely related to residential hospice              Patient currently is not medically stable to d/c.   Difficult to place patient No     Infusions:   sodium chloride 20 mL/hr at 06/18/21 2036    Scheduled  Meds:  insulin aspart  0-9 Units Subcutaneous Q4H    Antimicrobials: Anti-infectives (From admission, onward)    Start     Dose/Rate Route Frequency Ordered Stop   06/20/21 0000  vancomycin (VANCOREADY) IVPB 1250 mg/250 mL  Status:  Discontinued        1,250 mg 166.7 mL/hr over 90 Minutes Intravenous Every 48 hours 06/18/21 0147 06/19/21 0828   06/20/21 0000  vancomycin (VANCOCIN) IVPB 1000 mg/200 mL premix  Status:  Discontinued        1,000 mg 200 mL/hr over 60 Minutes Intravenous Every 48 hours 06/19/21 0828 06/19/21 1301   06/19/21 1200  meropenem (MERREM) 500 mg in sodium chloride 0.9 % 100  mL IVPB  Status:  Discontinued        500 mg 200 mL/hr over 30 Minutes Intravenous Every 12 hours 06/19/21 0830 06/19/21 1301   06/18/21 1200  meropenem (MERREM) 1 g in sodium chloride 0.9 % 100 mL IVPB  Status:  Discontinued        1 g 200 mL/hr over 30 Minutes Intravenous Every 12 hours 06/18/21 0147 06/19/21 0830   06/18/21 0200  clindamycin (CLEOCIN) IVPB 600 mg  Status:  Discontinued        600 mg 100 mL/hr over 30 Minutes Intravenous Every 8 hours 06/17/21 2350 06/19/21 1301   06/18/21 0030  meropenem (MERREM) 1 g in sodium chloride 0.9 % 100 mL IVPB        1 g 200 mL/hr over 30 Minutes Intravenous  Once 06/17/21 2350 06/18/21 0102   06/18/21 0000  vancomycin (VANCOREADY) IVPB 1500 mg/300 mL        1,500 mg 150 mL/hr over 120 Minutes Intravenous  Once 06/17/21 2354 06/18/21 0231   06/17/21 2130  levofloxacin (LEVAQUIN) IVPB 750 mg        750 mg 100 mL/hr over 90 Minutes Intravenous  Once 06/17/21 2127 06/18/21 0120       PRN meds: acetaminophen **OR** acetaminophen, antiseptic oral rinse, HYDROcodone-acetaminophen, morphine injection, ondansetron **OR** ondansetron (ZOFRAN) IV, polyvinyl alcohol   Objective: Vitals:   06/19/21 0518 06/19/21 1120  BP: (!) 106/51 (!) 111/58  Pulse: 96 99  Resp: 20 20  Temp: 99.7 F (37.6 C) 98.9 F (37.2 C)  SpO2: 99% 98%    Intake/Output Summary (Last 24 hours) at 06/19/2021 1306 Last data filed at 06/19/2021 0411 Gross per 24 hour  Intake 3035.33 ml  Output 750 ml  Net 2285.33 ml   Filed Weights   06/19/21 0700  Weight: 62.5 kg   Weight change:  Body mass index is 25.21 kg/m.   Physical Exam: General exam: Elderly Caucasian female.  Partially sedated Skin: No rashes, lesions or ulcers. HEENT: Atraumatic, normocephalic, no obvious bleeding Lungs: Clear to auscultation bilaterally CVS: Regular rate and rhythm, no murmur GI/Abd soft, nontender, nondistended, bowel sound present CNS: Partially awake, opens eyes on verbal  command, able to answer orientation questions appropriately.  Able to have meaningful conversation. Psychiatry: Depressed look Extremities: No pedal edema, no calf tenderness, tenderness present in bilateral thighs on medial aspect.  Data Review: I have personally reviewed the laboratory data and studies available.  Recent Labs  Lab 06/17/21 2021 06/18/21 0026 06/19/21 0709  WBC 33.1* 28.4* 31.3*  NEUTROABS 29.8* 26.0* 28.5*  HGB 7.8* 6.5* 10.2*  HCT 24.3* 20.5* 30.4*  MCV 81.0 82.0 81.9  PLT 491* 426* 409*   Recent Labs  Lab 06/17/21 2021 06/18/21 0026 06/19/21 0709  NA 132* 132* 133*  K 4.0 3.5 4.3  CL 105 105 108  CO2 18* 16* 16*  GLUCOSE 90 104* 75  BUN 83* 72* 88*  CREATININE 1.61* 1.42* 2.09*  CALCIUM 7.7* 6.6* 7.5*    F/u labs  Unresulted Labs (From admission, onward)    None       Signed, Terrilee Croak, MD [facility] 06/19/2021

## 2021-06-19 NOTE — TOC Progression Note (Signed)
Transition of Care [facility]) - Progression Note    Patient Details  Name: Julia Shea MRN: [id number] Date of Birth: 1956/06/26  [facility]) CM/SW Contact  Purcell Mouton, RN Phone Number: 06/19/2021, 2:54 PM  Clinical Narrative:     Spoke with Julia Shea and Julia Shea, pt's sister in law and pt's brother concerning Hospice/Residential/House. Only [facility]'s. Pt asked for HCPOA be done here in hospital. Chaplain was made aware. Julia Shea was called again concerning Hospice. Julia Shea states that she ans Julia Shea would like information faxed to [facility] [phone] Fax. If [facility] can not take pt Julia Shea and Julia Shea will take pt home. At present time Julia Shea and Julia Shea are traveling from [place]. Julia Shea and Julia Shea live at [address], [place], [place] [address].   Expected Discharge Plan: [place] Barriers to Discharge: No Barriers Identified  Expected Discharge Plan and Services Expected Discharge Plan: [place] Choice: Hospice Living arrangements for the past 2 months: [place]                                       Social Determinants of Health (SDOH) Interventions    Readmission Risk Interventions No flowsheet data found.

## 2021-06-19 NOTE — Progress Notes (Signed)
Pharmacy Antibiotic Note  Julia Shea is a 66 y.o. female admitted on 06/17/2021 with sepsis due to bilateral thigh abscess with possible necrotizing fasciitis, as well as possible UTI. Pharmacy has been consulted for Meropenem and Vancomycin dosing.  Pharmacist discussed with MD on admission- given hx of PCN allergy and report of enterococcal UTI from nursing facility she would like to use Meropenem.  Patient has tolerated cephalosporins on multiple occasions in past.   Plan: Given increase in SCr: Adjust Meropenem to '500mg'$  IV q12h Adjust Vancomycin to 1g IV q48h to target AUC 400-550 Check Vancomycin levels at steady state, as indicated Clindamycin per MD Monitor renal function, cultures, clinical course   Height: '5\' 2"'$  (157.5 cm) Weight: 62.5 kg (137 lb 13.7 oz) IBW/kg (Calculated) : 50.1  Temp (24hrs), Avg:98.2 F (36.8 C), Min:97.5 F (36.4 C), Max:99.7 F (37.6 C)  Recent Labs  Lab 06/17/21 2021 06/17/21 2316 06/18/21 0026 06/19/21 0709  WBC 33.1*  --  28.4* 31.3*  CREATININE 1.61*  --  1.42* 2.09*  LATICACIDVEN 1.3 1.7  --   --      Estimated Creatinine Clearance: 23 mL/min (A) (by C-G formula based on SCr of 2.09 mg/dL (H)).    Allergies  Allergen Reactions   Penicillins     Childhood reaction- Has tolerated cephlosporins   Sulfa Antibiotics     Antimicrobials this admission: 9/17 Levofloxacin x 1 9/18 Meropenem >>  9/18 Vancomycin >> 9/18 Clindamycin >> (9/20)  Microbiology results: 9/17 BCx: NGTD 9/17 UCx: sent 9/18 Resp panel: COVID+, Influenza A/B-  Thank you for allowing pharmacy to be a part of this patient's care.   Lindell Spar, PharmD, BCPS Clinical Pharmacist  06/19/2021 8:30 AM

## 2021-06-19 NOTE — Progress Notes (Signed)
PHARMACY - PHYSICIAN COMMUNICATION CRITICAL VALUE ALERT - BLOOD CULTURE IDENTIFICATION (BCID)  Julia Shea is an 65 y.o. female who presented to [facility] on 06/17/2021 from [facility] with a chief complaint of urinary retention, low blood pressure, abdominal pain, thigh pain  Assessment: sepsis due to bilateral thigh abscess with possible necrotizing fasciitis, as well as possible UTI. Now blood cultures 1/4 bottles with Streptococcus species.   Name of physician (or Provider) Contacted: Dr. Pietro Cassis  Current antibiotics: Vancomycin, Meropenem, Clindamycin  Changes to prescribed antibiotics recommended:  All antibiotics discontinued per MD as patient now made comfort care.   Results for orders placed or performed during the hospital encounter of 06/17/21  Blood Culture ID Panel (Reflexed) (Collected: 06/17/2021  8:21 PM)  Result Value Ref Range   Enterococcus faecalis NOT DETECTED NOT DETECTED   Enterococcus Faecium NOT DETECTED NOT DETECTED   Listeria monocytogenes NOT DETECTED NOT DETECTED   Staphylococcus species NOT DETECTED NOT DETECTED   Staphylococcus aureus (BCID) NOT DETECTED NOT DETECTED   Staphylococcus epidermidis NOT DETECTED NOT DETECTED   Staphylococcus lugdunensis NOT DETECTED NOT DETECTED   Streptococcus species DETECTED (A) NOT DETECTED   Streptococcus agalactiae NOT DETECTED NOT DETECTED   Streptococcus pneumoniae NOT DETECTED NOT DETECTED   Streptococcus pyogenes NOT DETECTED NOT DETECTED   A.calcoaceticus-baumannii NOT DETECTED NOT DETECTED   Bacteroides fragilis NOT DETECTED NOT DETECTED   Enterobacterales NOT DETECTED NOT DETECTED   Enterobacter cloacae complex NOT DETECTED NOT DETECTED   Escherichia coli NOT DETECTED NOT DETECTED   Klebsiella aerogenes NOT DETECTED NOT DETECTED   Klebsiella oxytoca NOT DETECTED NOT DETECTED   Klebsiella pneumoniae NOT DETECTED NOT DETECTED   Proteus species NOT DETECTED NOT DETECTED   Salmonella species NOT DETECTED NOT  DETECTED   Serratia marcescens NOT DETECTED NOT DETECTED   Haemophilus influenzae NOT DETECTED NOT DETECTED   Neisseria meningitidis NOT DETECTED NOT DETECTED   Pseudomonas aeruginosa NOT DETECTED NOT DETECTED   Stenotrophomonas maltophilia NOT DETECTED NOT DETECTED   Candida albicans NOT DETECTED NOT DETECTED   Candida auris NOT DETECTED NOT DETECTED   Candida glabrata NOT DETECTED NOT DETECTED   Candida krusei NOT DETECTED NOT DETECTED   Candida parapsilosis NOT DETECTED NOT DETECTED   Candida tropicalis NOT DETECTED NOT DETECTED   Cryptococcus neoformans/gattii NOT DETECTED NOT DETECTED    [name] 06/19/2021  10:25 AM

## 2021-06-19 NOTE — Progress Notes (Signed)
Murray Hodgkins, Sister in law, updated about patients condition. Would like to speak to the Attending Physician in AM. Will left a note to AP thru secure chat.

## 2021-06-20 DIAGNOSIS — M726 Necrotizing fasciitis: Secondary | ICD-10-CM | POA: Diagnosis not present

## 2021-06-20 LAB — BPAM RBC
Blood Product Expiration Date: 202210152359
Blood Product Expiration Date: 202210182359
ISSUE DATE / TIME: 202209182044
ISSUE DATE / TIME: 202209190057
Unit Type and Rh: 9500
Unit Type and Rh: 9500

## 2021-06-20 LAB — GLUCOSE, CAPILLARY
Glucose-Capillary: 102 mg/dL — ABNORMAL HIGH (ref 70–99)
Glucose-Capillary: 103 mg/dL — ABNORMAL HIGH (ref 70–99)
Glucose-Capillary: 121 mg/dL — ABNORMAL HIGH (ref 70–99)
Glucose-Capillary: 144 mg/dL — ABNORMAL HIGH (ref 70–99)
Glucose-Capillary: 166 mg/dL — ABNORMAL HIGH (ref 70–99)
Glucose-Capillary: 201 mg/dL — ABNORMAL HIGH (ref 70–99)
Glucose-Capillary: 96 mg/dL (ref 70–99)

## 2021-06-20 LAB — TYPE AND SCREEN
ABO/RH(D): O NEG
Antibody Screen: NEGATIVE
Unit division: 0
Unit division: 0

## 2021-06-20 NOTE — Plan of Care (Signed)
  Problem: Pain Managment: Goal: General experience of comfort will improve Outcome: Progressing   Problem: Safety: Goal: Ability to remain free from injury will improve Outcome: Progressing   

## 2021-06-20 NOTE — TOC Progression Note (Signed)
Transition of Care [facility]) - Progression Note    Patient Details  Name: Julia Shea MRN: [id number] Date of Birth: 03-Feb-1956  Transition of Care [facility]) CM/SW Contact  Purcell Mouton, RN Phone Number: 06/20/2021, 10:17 AM  Clinical Narrative:    [name] called, she and pt's brother will come pick pt up in the AM.    Expected Discharge Plan: [place] Barriers to Discharge: No Barriers Identified  Expected Discharge Plan and Services Expected Discharge Plan: [place] In-house Referral: Chaplain, Hospice / Palliative Care Discharge Planning Services: CM Consult Post Acute Care Choice: Hospice Living arrangements for the past 2 months: [place]                                       Social Determinants of Health (SDOH) Interventions    Readmission Risk Interventions No flowsheet data found.

## 2021-06-20 NOTE — TOC Progression Note (Signed)
Transition of Care [facility]) - Progression Note    Patient Details  Name: Julia Shea MRN: [id number] Date of Birth: 1956-05-10  Transition of Care [facility]) CM/SW Contact  Purcell Mouton, RN Phone Number: 06/20/2021, 9:21 AM  Clinical Narrative:    Spoke with pt's sister in law [name] concerning discharge plans. [name] states that she and pt's brother will take pt home to [address], [place], [place] [address]. [name] was given telephone number to [place], and [employer] [phone]. Transportation will need to be paid up front. [name] also want [place].    Expected Discharge Plan: [place] Barriers to Discharge: No Barriers Identified  Expected Discharge Plan and Services Expected Discharge Plan: [place] In-house Referral: Chaplain, Hospice / Palliative Care Discharge Planning Services: CM Consult Post Acute Care Choice: Hospice Living arrangements for the past 2 months: [place]                                       Social Determinants of Health (SDOH) Interventions    Readmission Risk Interventions No flowsheet data found.

## 2021-06-20 NOTE — Progress Notes (Signed)
Palliative care consult received.  Chart reviewed and discussed with Dr. Pietro Cassis.  Since consult for palliative care involvement was initially placed, plan has been developed for transition to patient's family's house in [place] with hospice support.  As goals are clear no acute needs, we will hold on formal consult at this time.  Plan is for discharge tomorrow.  Please call if there are specific needs with which our team can be of assistance.  Micheline Rough, MD [place] Team [phone]  No charge note

## 2021-06-20 NOTE — Progress Notes (Addendum)
PROGRESS NOTE  Julia Shea  DOB: [birth year]  PCP: Patient, No Pcp Per (Inactive) QAS:[id number]  DOA: 06/17/2021  LOS: 2 days  Hospital Day: 4   Chief Complaint  Patient presents with   Urinary Retention   Tailbone Pain   Abnormal Lab    Brief narrative: Julia Shea is a 65 y.o. female with PMH significant for DM2, HTN, CKD, chronic indwelling Foley catheter, GERD who is resident at [facility].  Patient was brought to the ED on 9/17 for abdominal pain and persistent left thigh pain. EMS noted her blood pressure to be low at 66/42, she was given a liter of IV fluid and transferred to hospital  In the ED, she had a blood pressure 94/50 Blood work showed low serum bicarb level 18, creatinine elevated 1.61, WC count elevated to 33.1 COVID-19 positive.  Of note, she was COVID-19 positive 5 days prior at the nursing facility and she received antiviral treatment.  CT of the abdomen and pelvis showed bilateral intramuscular abscess on both thighs of about 5 cm each, with the collection meeting of the midline inferior to the pubic rami and between the pubic symphysis anterior to the urethra.  Findings raising suspicion of abscess as well as necrotizing fasciitis. Admitted to hospitalist service for sepsis secondary to bilateral thigh abscess and necrotizing fasciitis. On 9/19, I had extensive discussion with patient, her brother Gwyndolyn Saxon and sister-in-law [name].  They made a decision to do comfort care measures.  Subjective: Patient was seen and examined this morning.  Propped up in bed.  Not in distress.  Comfortable.  No family at bedside.  Assessment/Plan: Comfort care status -Initially admitted for sepsis secondary to bilateral thigh abscess and necrotizing fasciitis. On 9/19, I had extensive discussion with patient, her brother Gwyndolyn Saxon and sister-in-law [name].  They made a decision to do comfort care measures. -Comfort care measures ordered. -Hospice consulted.  Patient's family made  a decision to take her home in [place] tomorrow.  Other medical issues AKI on CKD 3A Hyponatremia Recent COVID-19 infection Acute on chronic anemia    Code Status:   Code Status: DNR  Nutritional status: Body mass index is 25.21 kg/m.     Diet: luxury feeding if patient is able to tolerate Diet Order             Diet regular Room service appropriate? Yes; Fluid consistency: Thin  Diet effective now                  DVT prophylaxis:    Antimicrobials: IV antibiotics stopped Fluid: None Consultants: None Family Communication: Discussed with family yesterday 9/19  Status is: Inpatient  Remains inpatient appropriate because: Planning to discharge to home at [place]: The patient is from: [facility]              Anticipated d/c is to: Family plans to take her home tomorrow with hospice services              Patient currently is not medically stable to d/c.   Difficult to place patient No     Infusions:   sodium chloride 20 mL/hr at 06/18/21 2036    Scheduled Meds:  insulin aspart  0-9 Units Subcutaneous Q4H    Antimicrobials: Anti-infectives (From admission, onward)    Start     Dose/Rate Route Frequency Ordered Stop   06/20/21 0000  vancomycin (VANCOREADY) IVPB 1250 mg/250 mL  Status:  Discontinued  1,250 mg 166.7 mL/hr over 90 Minutes Intravenous Every 48 hours 06/18/21 0147 06/19/21 0828   06/20/21 0000  vancomycin (VANCOCIN) IVPB 1000 mg/200 mL premix  Status:  Discontinued        1,000 mg 200 mL/hr over 60 Minutes Intravenous Every 48 hours 06/19/21 0828 06/19/21 1301   06/19/21 1200  meropenem (MERREM) 500 mg in sodium chloride 0.9 % 100 mL IVPB  Status:  Discontinued        500 mg 200 mL/hr over 30 Minutes Intravenous Every 12 hours 06/19/21 0830 06/19/21 1301   06/18/21 1200  meropenem (MERREM) 1 g in sodium chloride 0.9 % 100 mL IVPB  Status:  Discontinued        1 g 200 mL/hr over 30 Minutes Intravenous  Every 12 hours 06/18/21 0147 06/19/21 0830   06/18/21 0200  clindamycin (CLEOCIN) IVPB 600 mg  Status:  Discontinued        600 mg 100 mL/hr over 30 Minutes Intravenous Every 8 hours 06/17/21 2350 06/19/21 1301   06/18/21 0030  meropenem (MERREM) 1 g in sodium chloride 0.9 % 100 mL IVPB        1 g 200 mL/hr over 30 Minutes Intravenous  Once 06/17/21 2350 06/18/21 0102   06/18/21 0000  vancomycin (VANCOREADY) IVPB 1500 mg/300 mL        1,500 mg 150 mL/hr over 120 Minutes Intravenous  Once 06/17/21 2354 06/18/21 0231   06/17/21 2130  levofloxacin (LEVAQUIN) IVPB 750 mg        750 mg 100 mL/hr over 90 Minutes Intravenous  Once 06/17/21 2127 06/18/21 0120       PRN meds: acetaminophen **OR** acetaminophen, antiseptic oral rinse, HYDROcodone-acetaminophen, morphine injection, ondansetron **OR** ondansetron (ZOFRAN) IV, polyvinyl alcohol   Objective: Vitals:   06/20/21 0443 06/20/21 1313  BP: (!) 120/57 (!) 119/59  Pulse: 94 90  Resp: 19   Temp: 99.5 F (37.5 C) 97.8 F (36.6 C)  SpO2: 97% 99%    Intake/Output Summary (Last 24 hours) at 06/20/2021 1349 Last data filed at 06/20/2021 1315 Gross per 24 hour  Intake 840 ml  Output 675 ml  Net 165 ml    Filed Weights   06/19/21 0700  Weight: 62.5 kg   Weight change:  Body mass index is 25.21 kg/m.   Physical Exam: General exam: Elderly Caucasian female.  Partially sedated I did not do a detailed examination because of comfort care status.    Data Review: I have personally reviewed the laboratory data and studies available.  Recent Labs  Lab 06/17/21 2021 06/18/21 0026 06/19/21 0709  WBC 33.1* 28.4* 31.3*  NEUTROABS 29.8* 26.0* 28.5*  HGB 7.8* 6.5* 10.2*  HCT 24.3* 20.5* 30.4*  MCV 81.0 82.0 81.9  PLT 491* 426* 409*    Recent Labs  Lab 06/17/21 2021 06/18/21 0026 06/19/21 0709  NA 132* 132* 133*  K 4.0 3.5 4.3  CL 105 105 108  CO2 18* 16* 16*  GLUCOSE 90 104* 75  BUN 83* 72* 88*  CREATININE 1.61* 1.42*  2.09*  CALCIUM 7.7* 6.6* 7.5*     F/u labs  Unresulted Labs (From admission, onward)    None       Signed, Terrilee Croak, MD [facility] 06/20/2021

## 2021-06-20 NOTE — TOC Progression Note (Signed)
Transition of Care [facility]) - Progression Note    Patient Details  Name: Shritha Bresee MRN: [id number] Date of Birth: 1955-10-02  [facility] CM/SW Contact  Purcell Mouton, RN Phone Number: 06/20/2021, 11:34 AM  Clinical Narrative:    [facility] is set up by [name] to pick pt up at 1030 in the AM to transport to [facility]. Please see prior note for the address. [facility] will need to be called by hospital.    Expected Discharge Plan: [place] Barriers to Discharge: No Barriers Identified  Expected Discharge Plan and Services Expected Discharge Plan: [place] In-house Referral: Chaplain, Hospice / Palliative Care Discharge Planning Services: CM Consult Post Acute Care Choice: Hospice Living arrangements for the past 2 months: Skilled Nursing Facility                                       Social Determinants of Health (SDOH) Interventions    Readmission Risk Interventions No flowsheet data found.

## 2021-06-21 DIAGNOSIS — M726 Necrotizing fasciitis: Secondary | ICD-10-CM | POA: Diagnosis not present

## 2021-06-21 LAB — CULTURE, BLOOD (ROUTINE X 2): Special Requests: ADEQUATE

## 2021-06-21 LAB — GLUCOSE, CAPILLARY: Glucose-Capillary: 132 mg/dL — ABNORMAL HIGH (ref 70–99)

## 2021-06-21 MED ORDER — HYDROCODONE-ACETAMINOPHEN 5-325 MG PO TABS: 1.0000 | ORAL_TABLET | Freq: Four times a day (QID) | ORAL | 0 refills | Status: AC | PRN

## 2021-06-21 NOTE — Progress Notes (Signed)
Physician Discharge Summary  Julia Shea DTO:[id number] DOB: 24-Apr-1956 DOA: 06/17/2021  PCP: Patient, No Pcp Per (Inactive)  Admit date: 06/17/2021 Discharge date: 06/21/2021  Admitted From: [facility] facility Discharge disposition: Home with hospice   Code Status: DNR   Discharge Diagnosis:   Active Problems:   Diabetes mellitus type 2, uncontrolled ([place])   Chronic indwelling Foley catheter   Hyponatremia   Acute lower UTI   AKI (acute kidney injury) ([place])   Anemia of chronic disease   Sepsis ([place])   Necrotizing fasciitis ([place])   COVID-19 virus infection     Chief Complaint  Patient presents with   Urinary Retention   Tailbone Pain   Abnormal Lab    Brief narrative: Julia Shea is a 65 y.o. female with PMH significant for DM2, HTN, CKD, chronic indwelling Foley catheter, GERD who is resident at [facility]  Patient was brought to the ED on 9/17 for abdominal pain and persistent left thigh pain. EMS noted her blood pressure to be low at 66/42, she was given a liter of IV fluid and transferred to hospital  In the ED, she had a blood pressure 94/50 Blood work showed low serum bicarb level 18, creatinine elevated 1.61, WC count elevated to 33.1 COVID-19 positive.  Of note, she was COVID-19 positive 5 days prior at the nursing facility and she received antiviral treatment.  CT of the abdomen and pelvis showed bilateral intramuscular abscess on both thighs of about 5 cm each, with the collection meeting of the midline inferior to the pubic rami and between the pubic symphysis anterior to the urethra.  Findings raising suspicion of abscess as well as necrotizing fasciitis. Admitted to hospitalist service for sepsis secondary to bilateral thigh abscess and necrotizing fasciitis. On 9/19, I had extensive discussion with patient, her brother Julia Shea and sister-in-law Julia Shea.  They made a decision to do comfort care measures.  Subjective: Patient was seen and  examined this morning.  Propped up in bed.  Not in distress.  Comfortable.  Hospital course Comfort care status -Initially admitted for sepsis secondary to bilateral thigh abscess and necrotizing fasciitis.  Patient repeatedly denied any interest in seeking any medical or surgical treatment of her condition.  She specifically denied antibiotics, needle aspiration.  She clearly understood that with overwhelming infection, she could die in a very short span.  As per patient's request, I also called her family members.  On 9/19, I had extensive discussion with patient, her brother Julia Shea and sister-in-law Julia Shea.  They agreed to patient's choice and decision to do comfort care measures only. -Comfort care measures were initiated. -Family made a decision to take her home in [place] with a plan to do home hospice.  Other medical issues AKI on CKD 3A Hyponatremia Recent COVID-19 infection Acute on chronic anemia    Allergies as of 06/21/2021       Reactions   Penicillins    Childhood reaction- Has tolerated cephlosporins   Sulfa Antibiotics         Medication List     STOP taking these medications    acetaminophen 325 MG tablet Commonly known as: TYLENOL   Basaglar KwikPen 100 UNIT/ML   Biofreeze 4 % Gel Generic drug: Menthol (Topical Analgesic)   cephALEXin 500 MG capsule Commonly known as: KEFLEX   enoxaparin 30 MG/0.3ML injection Commonly known as: LOVENOX   escitalopram 20 MG tablet Commonly known as: LEXAPRO   famotidine 20 MG tablet Commonly known as: PEPCID  ferrous sulfate 325 (65 FE) MG tablet   gabapentin 100 MG capsule Commonly known as: NEURONTIN   insulin aspart 100 UNIT/ML injection Commonly known as: novoLOG   liver oil-zinc oxide 40 % ointment Commonly known as: DESITIN   magnesium oxide 400 (241.3 Mg) MG tablet Commonly known as: MAG-OX   Melatonin 10 MG Tabs   mirabegron ER 25 MG Tb24 tablet Commonly known as: MYRBETRIQ    multivitamin with minerals tablet   nitrofurantoin (macrocrystal-monohydrate) 100 MG capsule Commonly known as: MACROBID   PAXLOVID (150/100) PO   potassium chloride 10 MEQ tablet Commonly known as: KLOR-CON       TAKE these medications    HYDROcodone-acetaminophen 5-325 MG tablet Commonly known as: NORCO/VICODIN Take 1 tablet by mouth every 6 (six) hours as needed for up to 5 days for moderate pain.               Discharge Care Instructions  (From admission, onward)           Start     Ordered   06/21/21 0000  No dressing needed        06/21/21 0910            Discharge Instructions:  Diet Recommendation:  Discharge Diet Orders (From admission, onward)     Start     Ordered   06/21/21 0000  Diet general       Comments: Luxury feeding as tolerated.   06/21/21 0910              Follow with Primary MD Patient, No Pcp Per (Inactive) in 7 days    Please note You were cared for by a hospitalist during your hospital stay. If you have any questions about your discharge medications or the care you received while you were in the hospital after you are discharged, you can call the unit and asked to speak with the hospitalist on call if the hospitalist that took care of you is not available. Once you are discharged, your primary care physician will handle any further medical issues. Please note that NO REFILLS for any discharge medications will be authorized once you are discharged, as it is imperative that you return to your primary care physician (or establish a relationship with a primary care physician if you do not have one) for your aftercare needs so that they can reassess your need for medications and monitor your lab values.    Follow ups:    Follow-up [place] Follow up.   Contact information: [place] [id number] [phone]                 Wound care:   Pressure Injury 12/21/17 Stage I -  Intact skin with non-blanchable redness of a localized area usually over a bony prominence. reddened area; no skin breakdown (Active)  Date First Assessed/Time First Assessed: 12/21/17 1809   Location: Sacrum  Staging: Stage I -  Intact skin with non-blanchable redness of a localized area usually over a bony prominence.  Wound Description (Comments): reddened area; no skin breakdown ...    Assessments 12/21/2017  6:09 PM 12/23/2017  9:00 AM  Dressing Type Foam Foam  Dressing Other (Comment) Clean;Dry;Intact  Dressing Change Frequency Other (Comment) --  Site / Wound Assessment Red;Dry Clean;Other (Comment)  Peri-wound Assessment Intact --  Treatment Cleansed --     No Linked orders to display  Wound / Incision (Open or Dehisced) 08/22/19 Other (Comment) Buttocks Right;Left (Active)  Date First Assessed/Time First Assessed: 08/22/19 0400   Wound Type: Other (Comment)  Location: Buttocks  Location Orientation: Right;Left  Present on Admission: Yes    Assessments 08/22/2019 10:00 AM 09/09/2019  7:45 PM  Dressing Type Foam - Lift dressing to assess site every shift None  Dressing Status Clean;Dry;Intact --  Dressing Change Frequency PRN --  Site / Wound Assessment Granulation tissue --  % Wound base Other/Granulation Tissue (Comment) 90% --  Peri-wound Assessment Intact --  Wound Length (cm) 1 cm --  Wound Width (cm) 1 cm --  Wound Surface Area (cm^2) 1 cm^2 --  Drainage Amount None --     No Linked orders to display     Wound / Incision (Open or Dehisced) 06/18/21 Other (Comment) Thigh Right;Proximal red swollen right thigh with fluid collection per CT Scan (Active)  Date First Assessed/Time First Assessed: 06/18/21 1514   Wound Type: (c) Other (Comment)  Location: Thigh  Location Orientation: Right;Proximal  Wound Description (Comments): red swollen right thigh with fluid collection per CT Scan  Present on Admiss...     Assessments 06/18/2021  3:00 PM 06/20/2021  8:10 PM  Dressing Type Foam - Lift dressing to assess site every shift --  Dressing Changed New --  Dressing Status Clean;Dry;Intact --  Dressing Change Frequency PRN PRN  Site / Wound Assessment Clean;Dry;Red --     No Linked orders to display    Discharge Exam:   Vitals:   06/20/21 0443 06/20/21 1313 06/20/21 2009 06/21/21 0506  BP: (!) 120/57 (!) 119/59 127/62 132/89  Pulse: 94 90 81 95  Resp: 19  16 17   Temp: 99.5 F (37.5 C) 97.8 F (36.6 C) 98 F (36.7 C) 99.2 F (37.3 C)  TempSrc: Oral Oral Oral Oral  SpO2: 97% 99% 100% 97%  Weight:      Height:        Body mass index is 25.21 kg/m.  General exam: Elderly Caucasian female.  Comfortable.  Not in distress I did not do a detailed examination because of her comfort care status.  Time coordinating discharge: 25 minutes   The results of significant diagnostics from this hospitalization (including imaging, microbiology, ancillary and laboratory) are listed below for reference.    Procedures and Diagnostic Studies:   CT Abdomen Pelvis W Contrast  Addendum Date: 06/18/2021   ADDENDUM REPORT: 06/18/2021 00:05 ADDENDUM: In the left greater than right medial thigh, there are intramuscular fluid collections, with peripheral enhancement, measuring up to 4.6 x 5.0 cm on the left and 4.7 x 2.6 cm on the right (series 2, images 91 and 96), extending off the inferior field of view. The collections meet at the midline, inferior to the pubic rami and between the pubic symphysis (series 2, image 90), anterior to the urethra. These demonstrates significant surrounding stranding and multiple foci of air. These are concerning for abscesses, although necrotizing fasciitis can not be excluded. Discussed with Dr. Susa Simmonds at 8:55 PM. Electronically Signed   By: Merilyn Baba M.D.   On: 06/18/2021 00:05   Result Date: 06/18/2021 CLINICAL DATA:  Abdominal pain, decreased urine output EXAM: CT ABDOMEN  AND PELVIS WITH CONTRAST TECHNIQUE: Multidetector CT imaging of the abdomen and pelvis was performed using the standard protocol following bolus administration of intravenous contrast. CONTRAST:  76mL OMNIPAQUE IOHEXOL 350 MG/ML SOLN COMPARISON:  02/19/2021 FINDINGS: Lower chest: No acute abnormality. Hepatobiliary: No focal liver abnormality is  seen. No gallstones, gallbladder wall thickening, or biliary dilatation. The gallbladder is mildly distended, with flattening of the fundus. Pancreas: Unremarkable. No pancreatic ductal dilatation or surrounding inflammatory changes. Spleen: Normal in size without focal abnormality. Adrenals/Urinary Tract: The adrenal glands are unremarkable. The kidneys enhance symmetrically with no hydronephrosis. Subcentimeter hypoattenuating lesions, too small to characterize but most likely renal cysts. Enhancement of the right renal pelvis and proximal to mid right ureter. The bladder appears thickened and contains a Foley. Stomach/Bowel: The stomach is normal in appearance. No evidence of bowel wall thickening, distention, or obstruction. The appendix is unremarkable. Vascular/Lymphatic: Aortic atherosclerosis. No enlarged abdominal or pelvic lymph nodes. Prominent right external iliac chain lymph node (series 71 from image 96), measuring up to 6 mm in short axis, likely reactive. Reproductive: The uterus is present.  No adnexal mass. Other: No free fluid or free air in the abdomen or pelvis. Musculoskeletal: No acute osseous abnormality.  Osteopenia. IMPRESSION: Enhancement of the right renal pelvis and proximal to mid right ureter, without decreased enhancement in the right kidney, concerning for ascending ureteral infection in the setting of known urinary tract infection, without current evidence of pyelonephritis. Electronically Signed: By: Merilyn Baba M.D. On: 06/17/2021 22:37   DG Chest Port 1 View  Result Date: 06/17/2021 CLINICAL DATA:  Leukocytosis.  UTI.  COVID  positive for 5 days ago. EXAM: PORTABLE CHEST 1 VIEW.  Patient is markedly rotated COMPARISON:  Chest x-ray 11/12/2019, CT chest 08/30/2019 FINDINGS: The heart and mediastinal contours are limited due to patient rotation. Aortic calcification. No definite focal consolidation. Linear atelectasis versus scarring within the right lung. No pulmonary edema. No pleural effusion. No pneumothorax. No acute osseous abnormality. IMPRESSION: No definite acute cardiopulmonary abnormality with limited evaluation due to marked patient rotation. Recommend repeat chest x-ray. Electronically Signed   By: Iven Julia M.D.   On: 06/17/2021 22:00     Labs:   Basic Metabolic Panel: Recent Labs  Lab 06/17/21 2021 06/18/21 0026 06/19/21 0709  NA 132* 132* 133*  K 4.0 3.5 4.3  CL 105 105 108  CO2 18* 16* 16*  GLUCOSE 90 104* 75  BUN 83* 72* 88*  CREATININE 1.61* 1.42* 2.09*  CALCIUM 7.7* 6.6* 7.5*   GFR Estimated Creatinine Clearance: 23 mL/min (A) (by C-G formula based on SCr of 2.09 mg/dL (H)). Liver Function Tests: Recent Labs  Lab 06/18/21 0026 06/19/21 0709  AST 15 19  ALT 11 12  ALKPHOS 151* 147*  BILITOT 1.1 2.1*  PROT 4.7* 5.1*  ALBUMIN 1.6* 1.8*   No results for input(s): LIPASE, AMYLASE in the last 168 hours. No results for input(s): AMMONIA in the last 168 hours. Coagulation profile No results for input(s): INR, PROTIME in the last 168 hours.  CBC: Recent Labs  Lab 06/17/21 2021 06/18/21 0026 06/19/21 0709  WBC 33.1* 28.4* 31.3*  NEUTROABS 29.8* 26.0* 28.5*  HGB 7.8* 6.5* 10.2*  HCT 24.3* 20.5* 30.4*  MCV 81.0 82.0 81.9  PLT 491* 426* 409*   Cardiac Enzymes: Recent Labs  Lab 06/18/21 0026  CKTOTAL 18*   BNP: Invalid input(s): POCBNP CBG: Recent Labs  Lab 06/20/21 1135 06/20/21 1628 06/20/21 2014 06/20/21 2348 06/21/21 0504  GLUCAP 121* 166* 201* 144* 132*   D-Dimer No results for input(s): DDIMER in the last 72 hours. Hgb A1c No results for input(s):  HGBA1C in the last 72 hours. Lipid Profile No results for input(s): CHOL, HDL, LDLCALC, TRIG, CHOLHDL, LDLDIRECT in the last 72 hours. Thyroid  function studies No results for input(s): TSH, T4TOTAL, T3FREE, THYROIDAB in the last 72 hours.  Invalid input(s): FREET3 Anemia work up No results for input(s): VITAMINB12, FOLATE, FERRITIN, TIBC, IRON, RETICCTPCT in the last 72 hours. Microbiology Recent Results (from the past 240 hour(s))  Culture, blood (routine x 2)     Status: Abnormal   Collection Time: 06/17/21  8:21 PM   Specimen: BLOOD  Result Value Ref Range Status   Specimen Description   Final    BLOOD SITE NOT SPECIFIED Performed at [facility], [address], [place], [place] [address]    Special Requests   Final    BOTTLES DRAWN AEROBIC AND ANAEROBIC Blood Culture adequate volume Performed at [facility] [address] [place], [place] [address]    Culture  Setup Time   Final    GRAM POSITIVE COCCI IN CHAINS AEROBIC BOTTLE ONLY CRITICAL RESULT CALLED TO, READ BACK BY AND VERIFIED WITH: PHARMD M.SWYNE AT 3329 ON 09/19//2022 BY T.SAAD.    Culture (A)  Final    STREPTOCOCCUS ANGINOSIS THE SIGNIFICANCE OF ISOLATING THIS ORGANISM FROM A SINGLE SET OF BLOOD CULTURES WHEN MULTIPLE SETS ARE DRAWN IS UNCERTAIN. PLEASE NOTIFY THE MICROBIOLOGY DEPARTMENT WITHIN ONE WEEK IF SPECIATION AND SENSITIVITIES ARE REQUIRED. Performed at [facility], [place] [address] [place], [place] [address]    Report Status 06/21/2021 FINAL  Final  Culture, blood (routine x 2)     Status: None (Preliminary result)   Collection Time: 06/17/21  8:21 PM   Specimen: BLOOD  Result Value Ref Range Status   Specimen Description BLOOD SITE NOT SPECIFIED  Final   Special Requests   Final    BOTTLES DRAWN AEROBIC AND ANAEROBIC Blood Culture adequate volume   Culture   Final    NO GROWTH 2 DAYS Performed at [facility], [place] [address] [place], [place] [address]     Report Status PENDING  Incomplete  Urine Culture     Status: Abnormal   Collection Time: 06/17/21  8:21 PM   Specimen: Urine, Catheterized  Result Value Ref Range Status   Specimen Description   Final    URINE, CATHETERIZED Performed at [facility] [address], [place], [place] [address]    Special Requests   Final    NONE Performed at [facility], [place] [address], [place], [place] [address]    Culture MULTIPLE SPECIES PRESENT, SUGGEST RECOLLECTION (A)  Final   Report Status 06/19/2021 FINAL  Final  Blood Culture ID Panel (Reflexed)     Status: Abnormal   Collection Time: 06/17/21  8:21 PM  Result Value Ref Range Status   Enterococcus faecalis NOT DETECTED NOT DETECTED Final   Enterococcus Faecium NOT DETECTED NOT DETECTED Final   Listeria monocytogenes NOT DETECTED NOT DETECTED Final   Staphylococcus species NOT DETECTED NOT DETECTED Final   Staphylococcus aureus (BCID) NOT DETECTED NOT DETECTED Final   Staphylococcus epidermidis NOT DETECTED NOT DETECTED Final   Staphylococcus lugdunensis NOT DETECTED NOT DETECTED Final   Streptococcus species DETECTED (A) NOT DETECTED Final    Comment: Not Enterococcus species, Streptococcus agalactiae, Streptococcus pyogenes, or Streptococcus pneumoniae. CRITICAL RESULT CALLED TO, READ BACK BY AND VERIFIED WITH: PHARMD M.SWYNE AT 1015 ON 09/19//2022 BY T.SAAD.    Streptococcus agalactiae NOT DETECTED NOT DETECTED Final   Streptococcus pneumoniae NOT DETECTED NOT DETECTED Final   Streptococcus pyogenes NOT DETECTED NOT DETECTED Final   A.calcoaceticus-baumannii NOT DETECTED  NOT DETECTED Final   Bacteroides fragilis NOT DETECTED NOT DETECTED Final   Enterobacterales NOT DETECTED NOT DETECTED Final   Enterobacter cloacae complex NOT DETECTED NOT DETECTED Final   Escherichia coli NOT DETECTED NOT DETECTED Final   Klebsiella aerogenes NOT DETECTED NOT DETECTED Final   Klebsiella oxytoca NOT DETECTED NOT  DETECTED Final   Klebsiella pneumoniae NOT DETECTED NOT DETECTED Final   Proteus species NOT DETECTED NOT DETECTED Final   Salmonella species NOT DETECTED NOT DETECTED Final   Serratia marcescens NOT DETECTED NOT DETECTED Final   Haemophilus influenzae NOT DETECTED NOT DETECTED Final   Neisseria meningitidis NOT DETECTED NOT DETECTED Final   Pseudomonas aeruginosa NOT DETECTED NOT DETECTED Final   Stenotrophomonas maltophilia NOT DETECTED NOT DETECTED Final   Candida albicans NOT DETECTED NOT DETECTED Final   Candida auris NOT DETECTED NOT DETECTED Final   Candida glabrata NOT DETECTED NOT DETECTED Final   Candida krusei NOT DETECTED NOT DETECTED Final   Candida parapsilosis NOT DETECTED NOT DETECTED Final   Candida tropicalis NOT DETECTED NOT DETECTED Final   Cryptococcus neoformans/gattii NOT DETECTED NOT DETECTED Final    Comment: Performed at [facility], [place] [address] [place], [place] [address]  Resp Panel by RT-PCR (Flu A&B, Covid) Nasopharyngeal Swab     Status: Abnormal   Collection Time: 06/18/21 12:26 AM   Specimen: Nasopharyngeal Swab; Nasopharyngeal(NP) swabs in vial transport medium  Result Value Ref Range Status   SARS Coronavirus 2 by RT PCR POSITIVE (A) NEGATIVE Final    Comment: RESULT CALLED TO, READ BACK BY AND VERIFIED WITH: KISER,C. 06/18/21 @0407  BY SEEL,M (NOTE) SARS-CoV-2 target nucleic acids are DETECTED.  The SARS-CoV-2 RNA is generally detectable in upper respiratory specimens during the acute phase of infection. Positive results are indicative of the presence of the identified virus, but do not rule out bacterial infection or co-infection with other pathogens not detected by the test. Clinical correlation with patient history and other diagnostic information is necessary to determine patient infection status. The expected result is Negative.  Fact Sheet for Patients: EntrepreneurPulse.com.au  Fact Sheet for Healthcare  Providers: IncredibleEmployment.be  This test is not yet approved or cleared by the [place] FDA and  has been authorized for detection and/or diagnosis of SARS-CoV-2 by FDA under an Emergency Use Authorization (EUA).  This EUA will remain in effect (meaning this test can be  used) for the duration of  the COVID-19 declaration under Section 564(b)(1) of the Act, 21 U.S.C. section 360bbb-3(b)(1), unless the authorization is terminated or revoked sooner.     Influenza A by PCR NEGATIVE NEGATIVE Final   Influenza B by PCR NEGATIVE NEGATIVE Final    Comment: (NOTE) The Xpert Xpress SARS-CoV-2/FLU/RSV plus assay is intended as an aid in the diagnosis of influenza from Nasopharyngeal swab specimens and should not be used as a sole basis for treatment. Nasal washings and aspirates are unacceptable for Xpert Xpress SARS-CoV-2/FLU/RSV testing.  Fact Sheet for Patients: EntrepreneurPulse.com.au  Fact Sheet for Healthcare Providers: IncredibleEmployment.be  This test is not yet approved or cleared by the [place] FDA and has been authorized for detection and/or diagnosis of SARS-CoV-2 by FDA under an Emergency Use Authorization (EUA). This EUA will remain in effect (meaning this test can be used) for the duration of the COVID-19 declaration under Section 564(b)(1) of the Act, 21 U.S.C. section 360bbb-3(b)(1), unless the authorization is terminated or revoked.  Performed at [facility], [place] [place],  [place] [address]      Signed: Terrilee Croak  [facility] 06/21/2021, 9:10 AM

## 2021-06-21 NOTE — Discharge Summary (Signed)
Physician Discharge Summary  Julia Shea OXB:[id number] DOB: [birth year] DOA: 06/17/2021  PCP: Patient, No Pcp Per (Inactive)  Admit date: 06/17/2021 Discharge date: 06/21/2021  Admitted From: [facility] facility Discharge disposition: Home with hospice   Code Status: DNR   Discharge Diagnosis:   Active Problems:   Diabetes mellitus type 2, uncontrolled ([place])   Chronic indwelling Foley catheter   Hyponatremia   Acute lower UTI   AKI (acute kidney injury) ([place])   Anemia of chronic disease   Sepsis ([place])   Necrotizing fasciitis ([place])   COVID-19 virus infection     Chief Complaint  Patient presents with   Urinary Retention   Tailbone Pain   Abnormal Lab    Brief narrative: Julia Shea is a 65 y.o. female with PMH significant for DM2, HTN, CKD, chronic indwelling Foley catheter, GERD who is resident at [facility]  Patient was brought to the ED on 9/17 for abdominal pain and persistent left thigh pain. EMS noted her blood pressure to be low at 66/42, she was given a liter of IV fluid and transferred to hospital  In the ED, she had a blood pressure 94/50 Blood work showed low serum bicarb level 18, creatinine elevated 1.61, WC count elevated to 33.1 COVID-19 positive.  Of note, she was COVID-19 positive 5 days prior at the nursing facility and she received antiviral treatment.  CT of the abdomen and pelvis showed bilateral intramuscular abscess on both thighs of about 5 cm each, with the collection meeting of the midline inferior to the pubic rami and between the pubic symphysis anterior to the urethra.  Findings raising suspicion of abscess as well as necrotizing fasciitis. Admitted to hospitalist service for sepsis secondary to bilateral thigh abscess and necrotizing fasciitis. On 9/19, I had extensive discussion with patient, her brother Julia Shea and sister-in-law Julia Shea.  They made a decision to do comfort care measures.  Subjective: Patient was seen and  examined this morning.  Propped up in bed.  Not in distress.  Comfortable.  Hospital course Comfort care status -Initially admitted for sepsis secondary to bilateral thigh abscess and necrotizing fasciitis.  Patient repeatedly denied any interest in seeking any medical or surgical treatment of her condition.  She specifically denied antibiotics, needle aspiration.  She clearly understood that with overwhelming infection, she could die in a very short span.  As per patient's request, I also called her family members.  On 9/19, I had extensive discussion with patient, her brother Julia Shea and sister-in-law Julia Shea.  They agreed to patient's choice and decision to do comfort care measures only. -Comfort care measures were initiated. -Family made a decision to take her home in [place] with a plan to do home hospice.  Other medical issues AKI on CKD 3A Hyponatremia Recent COVID-19 infection Acute on chronic anemia    Allergies as of 06/21/2021       Reactions   Penicillins    Childhood reaction- Has tolerated cephlosporins   Sulfa Antibiotics         Medication List     STOP taking these medications    acetaminophen 325 MG tablet Commonly known as: TYLENOL   Basaglar KwikPen 100 UNIT/ML   Biofreeze 4 % Gel Generic drug: Menthol (Topical Analgesic)   cephALEXin 500 MG capsule Commonly known as: KEFLEX   enoxaparin 30 MG/0.3ML injection Commonly known as: LOVENOX   escitalopram 20 MG tablet Commonly known as: LEXAPRO   famotidine 20 MG tablet Commonly known as: PEPCID  ferrous sulfate 325 (65 FE) MG tablet   gabapentin 100 MG capsule Commonly known as: NEURONTIN   insulin aspart 100 UNIT/ML injection Commonly known as: novoLOG   liver oil-zinc oxide 40 % ointment Commonly known as: DESITIN   magnesium oxide 400 (241.3 Mg) MG tablet Commonly known as: MAG-OX   Melatonin 10 MG Tabs   mirabegron ER 25 MG Tb24 tablet Commonly known as: MYRBETRIQ    multivitamin with minerals tablet   nitrofurantoin (macrocrystal-monohydrate) 100 MG capsule Commonly known as: MACROBID   PAXLOVID (150/100) PO   potassium chloride 10 MEQ tablet Commonly known as: KLOR-CON       TAKE these medications    HYDROcodone-acetaminophen 5-325 MG tablet Commonly known as: NORCO/VICODIN Take 1 tablet by mouth every 6 (six) hours as needed for up to 5 days for moderate pain.               Discharge Care Instructions  (From admission, onward)           Start     Ordered   06/21/21 0000  No dressing needed        06/21/21 0910            Discharge Instructions:  Diet Recommendation:  Discharge Diet Orders (From admission, onward)     Start     Ordered   06/21/21 0000  Diet general       Comments: Luxury feeding as tolerated.   06/21/21 0910              Follow with Primary MD Patient, No Pcp Per (Inactive) in 7 days    Please note You were cared for by a hospitalist during your hospital stay. If you have any questions about your discharge medications or the care you received while you were in the hospital after you are discharged, you can call the unit and asked to speak with the hospitalist on call if the hospitalist that took care of you is not available. Once you are discharged, your primary care physician will handle any further medical issues. Please note that NO REFILLS for any discharge medications will be authorized once you are discharged, as it is imperative that you return to your primary care physician (or establish a relationship with a primary care physician if you do not have one) for your aftercare needs so that they can reassess your need for medications and monitor your lab values.    Follow ups:    Follow-up [facility] Follow up.   Contact information: [place] [phone]                 Wound care:   Pressure Injury 12/21/17 Stage I -  Intact skin with non-blanchable redness of a localized area usually over a bony prominence. reddened area; no skin breakdown (Active)  Date First Assessed/Time First Assessed: 12/21/17 1809   Location: Sacrum  Staging: Stage I -  Intact skin with non-blanchable redness of a localized area usually over a bony prominence.  Wound Description (Comments): reddened area; no skin breakdown ...    Assessments 12/21/2017  6:09 PM 12/23/2017  9:00 AM  Dressing Type Foam Foam  Dressing Other (Comment) Clean;Dry;Intact  Dressing Change Frequency Other (Comment) --  Site / Wound Assessment Red;Dry Clean;Other (Comment)  Peri-wound Assessment Intact --  Treatment Cleansed --     No Linked orders to display  Wound / Incision (Open or Dehisced) 08/22/19 Other (Comment) Buttocks Right;Left (Active)  Date First Assessed/Time First Assessed: 08/22/19 0400   Wound Type: Other (Comment)  Location: Buttocks  Location Orientation: Right;Left  Present on Admission: Yes    Assessments 08/22/2019 10:00 AM 09/09/2019  7:45 PM  Dressing Type Foam - Lift dressing to assess site every shift None  Dressing Status Clean;Dry;Intact --  Dressing Change Frequency PRN --  Site / Wound Assessment Granulation tissue --  % Wound base Other/Granulation Tissue (Comment) 90% --  Peri-wound Assessment Intact --  Wound Length (cm) 1 cm --  Wound Width (cm) 1 cm --  Wound Surface Area (cm^2) 1 cm^2 --  Drainage Amount None --     No Linked orders to display     Wound / Incision (Open or Dehisced) 06/18/21 Other (Comment) Thigh Right;Proximal red swollen right thigh with fluid collection per CT Scan (Active)  Date First Assessed/Time First Assessed: 06/18/21 1514   Wound Type: (c) Other (Comment)  Location: Thigh  Location Orientation: Right;Proximal  Wound Description (Comments): red swollen right thigh with fluid collection per CT Scan  Present on Admiss...     Assessments 06/18/2021  3:00 PM 06/20/2021  8:10 PM  Dressing Type Foam - Lift dressing to assess site every shift --  Dressing Changed New --  Dressing Status Clean;Dry;Intact --  Dressing Change Frequency PRN PRN  Site / Wound Assessment Clean;Dry;Red --     No Linked orders to display    Discharge Exam:   Vitals:   06/20/21 0443 06/20/21 1313 06/20/21 2009 06/21/21 0506  BP: (!) 120/57 (!) 119/59 127/62 132/89  Pulse: 94 90 81 95  Resp: 19  16 17   Temp: 99.5 F (37.5 C) 97.8 F (36.6 C) 98 F (36.7 C) 99.2 F (37.3 C)  TempSrc: Oral Oral Oral Oral  SpO2: 97% 99% 100% 97%  Weight:      Height:        Body mass index is 25.21 kg/m.  General exam: Elderly Caucasian female.  Comfortable.  Not in distress I did not do a detailed examination because of her comfort care status.  Time coordinating discharge: 25 minutes   The results of significant diagnostics from this hospitalization (including imaging, microbiology, ancillary and laboratory) are listed below for reference.    Procedures and Diagnostic Studies:   CT Abdomen Pelvis W Contrast  Addendum Date: 06/18/2021   ADDENDUM REPORT: 06/18/2021 00:05 ADDENDUM: In the left greater than right medial thigh, there are intramuscular fluid collections, with peripheral enhancement, measuring up to 4.6 x 5.0 cm on the left and 4.7 x 2.6 cm on the right (series 2, images 91 and 96), extending off the inferior field of view. The collections meet at the midline, inferior to the pubic rami and between the pubic symphysis (series 2, image 90), anterior to the urethra. These demonstrates significant surrounding stranding and multiple foci of air. These are concerning for abscesses, although necrotizing fasciitis can not be excluded. Discussed with Dr. Susa Simmonds at 8:55 PM. Electronically Signed   By: Merilyn Baba M.D.   On: 06/18/2021 00:05   Result Date: 06/18/2021 CLINICAL DATA:  Abdominal pain, decreased urine output EXAM: CT ABDOMEN  AND PELVIS WITH CONTRAST TECHNIQUE: Multidetector CT imaging of the abdomen and pelvis was performed using the standard protocol following bolus administration of intravenous contrast. CONTRAST:  37mL OMNIPAQUE IOHEXOL 350 MG/ML SOLN COMPARISON:  02/19/2021 FINDINGS: Lower chest: No acute abnormality. Hepatobiliary: No focal liver abnormality is  seen. No gallstones, gallbladder wall thickening, or biliary dilatation. The gallbladder is mildly distended, with flattening of the fundus. Pancreas: Unremarkable. No pancreatic ductal dilatation or surrounding inflammatory changes. Spleen: Normal in size without focal abnormality. Adrenals/Urinary Tract: The adrenal glands are unremarkable. The kidneys enhance symmetrically with no hydronephrosis. Subcentimeter hypoattenuating lesions, too small to characterize but most likely renal cysts. Enhancement of the right renal pelvis and proximal to mid right ureter. The bladder appears thickened and contains a Foley. Stomach/Bowel: The stomach is normal in appearance. No evidence of bowel wall thickening, distention, or obstruction. The appendix is unremarkable. Vascular/Lymphatic: Aortic atherosclerosis. No enlarged abdominal or pelvic lymph nodes. Prominent right external iliac chain lymph node (series 71 from image 96), measuring up to 6 mm in short axis, likely reactive. Reproductive: The uterus is present.  No adnexal mass. Other: No free fluid or free air in the abdomen or pelvis. Musculoskeletal: No acute osseous abnormality.  Osteopenia. IMPRESSION: Enhancement of the right renal pelvis and proximal to mid right ureter, without decreased enhancement in the right kidney, concerning for ascending ureteral infection in the setting of known urinary tract infection, without current evidence of pyelonephritis. Electronically Signed: By: Merilyn Baba M.D. On: 06/17/2021 22:37   DG Chest Port 1 View  Result Date: 06/17/2021 CLINICAL DATA:  Leukocytosis.  UTI.  COVID  positive for 5 days ago. EXAM: PORTABLE CHEST 1 VIEW.  Patient is markedly rotated COMPARISON:  Chest x-ray 11/12/2019, CT chest 08/30/2019 FINDINGS: The heart and mediastinal contours are limited due to patient rotation. Aortic calcification. No definite focal consolidation. Linear atelectasis versus scarring within the right lung. No pulmonary edema. No pleural effusion. No pneumothorax. No acute osseous abnormality. IMPRESSION: No definite acute cardiopulmonary abnormality with limited evaluation due to marked patient rotation. Recommend repeat chest x-ray. Electronically Signed   By: Iven Finn M.D.   On: 06/17/2021 22:00     Labs:   Basic Metabolic Panel: Recent Labs  Lab 06/17/21 2021 06/18/21 0026 06/19/21 0709  NA 132* 132* 133*  K 4.0 3.5 4.3  CL 105 105 108  CO2 18* 16* 16*  GLUCOSE 90 104* 75  BUN 83* 72* 88*  CREATININE 1.61* 1.42* 2.09*  CALCIUM 7.7* 6.6* 7.5*    GFR Estimated Creatinine Clearance: 23 mL/min (A) (by C-G formula based on SCr of 2.09 mg/dL (H)). Liver Function Tests: Recent Labs  Lab 06/18/21 0026 06/19/21 0709  AST 15 19  ALT 11 12  ALKPHOS 151* 147*  BILITOT 1.1 2.1*  PROT 4.7* 5.1*  ALBUMIN 1.6* 1.8*    No results for input(s): LIPASE, AMYLASE in the last 168 hours. No results for input(s): AMMONIA in the last 168 hours. Coagulation profile No results for input(s): INR, PROTIME in the last 168 hours.  CBC: Recent Labs  Lab 06/17/21 2021 06/18/21 0026 06/19/21 0709  WBC 33.1* 28.4* 31.3*  NEUTROABS 29.8* 26.0* 28.5*  HGB 7.8* 6.5* 10.2*  HCT 24.3* 20.5* 30.4*  MCV 81.0 82.0 81.9  PLT 491* 426* 409*    Cardiac Enzymes: Recent Labs  Lab 06/18/21 0026  CKTOTAL 18*    BNP: Invalid input(s): POCBNP CBG: Recent Labs  Lab 06/20/21 1135 06/20/21 1628 06/20/21 2014 06/20/21 2348 06/21/21 0504  GLUCAP 121* 166* 201* 144* 132*    D-Dimer No results for input(s): DDIMER in the last 72 hours. Hgb A1c No results for  input(s): HGBA1C in the last 72 hours. Lipid Profile No results for input(s): CHOL, HDL, LDLCALC, TRIG, CHOLHDL, LDLDIRECT in  the last 72 hours. Thyroid function studies No results for input(s): TSH, T4TOTAL, T3FREE, THYROIDAB in the last 72 hours.  Invalid input(s): FREET3 Anemia work up No results for input(s): VITAMINB12, FOLATE, FERRITIN, TIBC, IRON, RETICCTPCT in the last 72 hours. Microbiology Recent Results (from the past 240 hour(s))  Culture, blood (routine x 2)     Status: Abnormal   Collection Time: 06/17/21  8:21 PM   Specimen: BLOOD  Result Value Ref Range Status   Specimen Description   Final    BLOOD SITE NOT SPECIFIED Performed at [facility], [address], [place], [place] [address]    Special Requests   Final    BOTTLES DRAWN AEROBIC AND ANAEROBIC Blood Culture adequate volume Performed at [facility] [address], [place], [place] [address]    Culture  Setup Time   Final    GRAM POSITIVE COCCI IN CHAINS AEROBIC BOTTLE ONLY CRITICAL RESULT CALLED TO, READ BACK BY AND VERIFIED WITH: PHARMD M.SWYNE AT 3825 ON 09/19//2022 BY T.SAAD.    Culture (A)  Final    STREPTOCOCCUS ANGINOSIS THE SIGNIFICANCE OF ISOLATING THIS ORGANISM FROM A SINGLE SET OF BLOOD CULTURES WHEN MULTIPLE SETS ARE DRAWN IS UNCERTAIN. PLEASE NOTIFY THE MICROBIOLOGY DEPARTMENT WITHIN ONE WEEK IF SPECIATION AND SENSITIVITIES ARE REQUIRED. Performed at [facility], [place] [address] [place], [place] [address]    Report Status 06/21/2021 FINAL  Final  Culture, blood (routine x 2)     Status: None (Preliminary result)   Collection Time: 06/17/21  8:21 PM   Specimen: BLOOD  Result Value Ref Range Status   Specimen Description BLOOD SITE NOT SPECIFIED  Final   Special Requests   Final    BOTTLES DRAWN AEROBIC AND ANAEROBIC Blood Culture adequate volume   Culture   Final    NO GROWTH 2 DAYS Performed at [facility], [place] [address] [place], [place]  [address]    Report Status PENDING  Incomplete  Urine Culture     Status: Abnormal   Collection Time: 06/17/21  8:21 PM   Specimen: Urine, Catheterized  Result Value Ref Range Status   Specimen Description   Final    URINE, CATHETERIZED Performed at [facility] [address] [place], [place] [address]    Special Requests   Final    NONE Performed at [facility], [place] [address], [place], [place] [address]    Culture MULTIPLE SPECIES PRESENT, SUGGEST RECOLLECTION (A)  Final   Report Status 06/19/2021 FINAL  Final  Blood Culture ID Panel (Reflexed)     Status: Abnormal   Collection Time: 06/17/21  8:21 PM  Result Value Ref Range Status   Enterococcus faecalis NOT DETECTED NOT DETECTED Final   Enterococcus Faecium NOT DETECTED NOT DETECTED Final   Listeria monocytogenes NOT DETECTED NOT DETECTED Final   Staphylococcus species NOT DETECTED NOT DETECTED Final   Staphylococcus aureus (BCID) NOT DETECTED NOT DETECTED Final   Staphylococcus epidermidis NOT DETECTED NOT DETECTED Final   Staphylococcus lugdunensis NOT DETECTED NOT DETECTED Final   Streptococcus species DETECTED (A) NOT DETECTED Final    Comment: Not Enterococcus species, Streptococcus agalactiae, Streptococcus pyogenes, or Streptococcus pneumoniae. CRITICAL RESULT CALLED TO, READ BACK BY AND VERIFIED WITH: PHARMD M.SWYNE AT 1015 ON 09/19//2022 BY T.SAAD.    Streptococcus agalactiae NOT DETECTED NOT DETECTED Final   Streptococcus pneumoniae NOT DETECTED NOT DETECTED Final   Streptococcus pyogenes NOT DETECTED NOT DETECTED Final  A.calcoaceticus-baumannii NOT DETECTED NOT DETECTED Final   Bacteroides fragilis NOT DETECTED NOT DETECTED Final   Enterobacterales NOT DETECTED NOT DETECTED Final   Enterobacter cloacae complex NOT DETECTED NOT DETECTED Final   Escherichia coli NOT DETECTED NOT DETECTED Final   Klebsiella aerogenes NOT DETECTED NOT DETECTED Final   Klebsiella oxytoca NOT  DETECTED NOT DETECTED Final   Klebsiella pneumoniae NOT DETECTED NOT DETECTED Final   Proteus species NOT DETECTED NOT DETECTED Final   Salmonella species NOT DETECTED NOT DETECTED Final   Serratia marcescens NOT DETECTED NOT DETECTED Final   Haemophilus influenzae NOT DETECTED NOT DETECTED Final   Neisseria meningitidis NOT DETECTED NOT DETECTED Final   Pseudomonas aeruginosa NOT DETECTED NOT DETECTED Final   Stenotrophomonas maltophilia NOT DETECTED NOT DETECTED Final   Candida albicans NOT DETECTED NOT DETECTED Final   Candida auris NOT DETECTED NOT DETECTED Final   Candida glabrata NOT DETECTED NOT DETECTED Final   Candida krusei NOT DETECTED NOT DETECTED Final   Candida parapsilosis NOT DETECTED NOT DETECTED Final   Candida tropicalis NOT DETECTED NOT DETECTED Final   Cryptococcus neoformans/gattii NOT DETECTED NOT DETECTED Final    Comment: Performed at [facility], [place]. [address], [place], [place] [address]  Resp Panel by RT-PCR (Flu A&B, Covid) Nasopharyngeal Swab     Status: Abnormal   Collection Time: 06/18/21 12:26 AM   Specimen: Nasopharyngeal Swab; Nasopharyngeal(NP) swabs in vial transport medium  Result Value Ref Range Status   SARS Coronavirus 2 by RT PCR POSITIVE (A) NEGATIVE Final    Comment: RESULT CALLED TO, READ BACK BY AND VERIFIED WITH: KISER,C. 06/18/21 @0407  BY SEEL,M (NOTE) SARS-CoV-2 target nucleic acids are DETECTED.  The SARS-CoV-2 RNA is generally detectable in upper respiratory specimens during the acute phase of infection. Positive results are indicative of the presence of the identified virus, but do not rule out bacterial infection or co-infection with other pathogens not detected by the test. Clinical correlation with patient history and other diagnostic information is necessary to determine patient infection status. The expected result is Negative.  Fact Sheet for Patients: EntrepreneurPulse.com.au  Fact Sheet for  Healthcare Providers: IncredibleEmployment.be  This test is not yet approved or cleared by the [place] FDA and  has been authorized for detection and/or diagnosis of SARS-CoV-2 by FDA under an Emergency Use Authorization (EUA).  This EUA will remain in effect (meaning this test can be  used) for the duration of  the COVID-19 declaration under Section 564(b)(1) of the Act, 21 U.S.C. section 360bbb-3(b)(1), unless the authorization is terminated or revoked sooner.     Influenza A by PCR NEGATIVE NEGATIVE Final   Influenza B by PCR NEGATIVE NEGATIVE Final    Comment: (NOTE) The Xpert Xpress SARS-CoV-2/FLU/RSV plus assay is intended as an aid in the diagnosis of influenza from Nasopharyngeal swab specimens and should not be used as a sole basis for treatment. Nasal washings and aspirates are unacceptable for Xpert Xpress SARS-CoV-2/FLU/RSV testing.  Fact Sheet for Patients: EntrepreneurPulse.com.au  Fact Sheet for Healthcare Providers: IncredibleEmployment.be  This test is not yet approved or cleared by the [place] FDA and has been authorized for detection and/or diagnosis of SARS-CoV-2 by FDA under an Emergency Use Authorization (EUA). This EUA will remain in effect (meaning this test can be used) for the duration of the COVID-19 declaration under Section 564(b)(1) of the Act, 21 U.S.C. section 360bbb-3(b)(1), unless the authorization is terminated or revoked.  Performed at [facility], [place]  [address] [place], [place] [address]      Signed: Marlowe Aschoff [place]  [facility] 06/21/2021, 9:12 AM

## 2021-06-21 NOTE — TOC Progression Note (Signed)
Transition of Care [facility]) - Progression Note    Patient Details  Name: Julia Shea MRN: [id number] Date of Birth: [birth year]  Transition of [facility]) CM/SW Contact  Purcell Mouton, RN Phone Number: 06/21/2021, 9:15 AM  Clinical Narrative:     [name] will arrive between 10 and 1030 AM to transport pt to [place], [place] to her brother and sister in [place].   Expected Discharge Plan: [place] Barriers to Discharge: No Barriers Identified  Expected Discharge Plan and Services Expected Discharge Plan: [place] In-house Referral: Chaplain, Hospice / Palliative Care Discharge Planning Services: CM Consult Post Acute Care Choice: Hospice Living arrangements for the past 2 months: [place] Expected Discharge Date: 06/21/21                                     Social Determinants of Health (SDOH) Interventions    Readmission Risk Interventions No flowsheet data found.

## 2021-06-26 LAB — CULTURE, BLOOD (ROUTINE X 2)
Culture: NO GROWTH
Special Requests: ADEQUATE

## 2021-06-28 ENCOUNTER — Other Ambulatory Visit: Payer: Self-pay | Admitting: Internal Medicine

## 2021-07-18 ENCOUNTER — Encounter (HOSPITAL_COMMUNITY): Payer: Self-pay | Admitting: Radiology

## 2021-12-19 ENCOUNTER — Encounter: Payer: Self-pay | Admitting: Gastroenterology
